# Patient Record
Sex: Male | Born: 1935 | Race: White | Hispanic: No | Smoking: Former smoker
Health system: Southern US, Community
[De-identification: ages and names within clinical notes are randomized; demographics above are authoritative.]

## PROBLEM LIST (undated history)

## (undated) DIAGNOSIS — I82409 Acute embolism and thrombosis of unspecified deep veins of unspecified lower extremity: Secondary | ICD-10-CM

## (undated) DIAGNOSIS — I119 Hypertensive heart disease without heart failure: Secondary | ICD-10-CM

## (undated) DIAGNOSIS — E782 Mixed hyperlipidemia: Secondary | ICD-10-CM

## (undated) DIAGNOSIS — I2510 Atherosclerotic heart disease of native coronary artery without angina pectoris: Secondary | ICD-10-CM

## (undated) DIAGNOSIS — Z8719 Personal history of other diseases of the digestive system: Secondary | ICD-10-CM

## (undated) DIAGNOSIS — R339 Retention of urine, unspecified: Secondary | ICD-10-CM

## (undated) DIAGNOSIS — L0390 Cellulitis, unspecified: Secondary | ICD-10-CM

## (undated) DIAGNOSIS — K649 Unspecified hemorrhoids: Secondary | ICD-10-CM

## (undated) DIAGNOSIS — J449 Chronic obstructive pulmonary disease, unspecified: Secondary | ICD-10-CM

## (undated) DIAGNOSIS — F419 Anxiety disorder, unspecified: Secondary | ICD-10-CM

## (undated) DIAGNOSIS — I5022 Chronic systolic (congestive) heart failure: Secondary | ICD-10-CM

## (undated) DIAGNOSIS — I729 Aneurysm of unspecified site: Secondary | ICD-10-CM

## (undated) DIAGNOSIS — F4310 Post-traumatic stress disorder, unspecified: Secondary | ICD-10-CM

## (undated) DIAGNOSIS — N400 Enlarged prostate without lower urinary tract symptoms: Secondary | ICD-10-CM

## (undated) DIAGNOSIS — I255 Ischemic cardiomyopathy: Secondary | ICD-10-CM

## (undated) DIAGNOSIS — R319 Hematuria, unspecified: Secondary | ICD-10-CM

## (undated) HISTORY — PX: CATARACT EXTRACTION: SUR2

## (undated) HISTORY — PX: CORONARY ANGIOPLASTY: SHX604

## (undated) HISTORY — PX: HIP SURGERY: SHX245

## (undated) HISTORY — DX: Mixed hyperlipidemia: E78.2

## (undated) HISTORY — PX: CHOLECYSTECTOMY: SHX55

## (undated) HISTORY — DX: Atherosclerotic heart disease of native coronary artery without angina pectoris: I25.10

## (undated) HISTORY — DX: Hypertensive heart disease without heart failure: I11.9

## (undated) HISTORY — PX: HAND SURGERY: SHX662

## (undated) HISTORY — PX: VASCULAR SURGERY: SHX849

## (undated) HISTORY — DX: Post-traumatic stress disorder, unspecified: F43.10

## (undated) HISTORY — PX: HEMORRHOID SURGERY: SHX153

---

## 2006-12-21 ENCOUNTER — Ambulatory Visit: Payer: Self-pay | Admitting: Ophthalmology

## 2006-12-21 ENCOUNTER — Other Ambulatory Visit: Payer: Self-pay

## 2007-01-03 ENCOUNTER — Ambulatory Visit: Payer: Self-pay | Admitting: Ophthalmology

## 2007-05-20 ENCOUNTER — Other Ambulatory Visit: Payer: Self-pay

## 2007-05-20 ENCOUNTER — Emergency Department: Payer: Self-pay | Admitting: Emergency Medicine

## 2008-06-27 ENCOUNTER — Emergency Department: Payer: Self-pay | Admitting: Emergency Medicine

## 2008-06-29 ENCOUNTER — Emergency Department: Payer: Self-pay | Admitting: Emergency Medicine

## 2009-07-01 ENCOUNTER — Ambulatory Visit: Payer: Self-pay | Admitting: Family Medicine

## 2009-08-11 ENCOUNTER — Ambulatory Visit: Payer: Self-pay | Admitting: Family Medicine

## 2009-08-23 ENCOUNTER — Inpatient Hospital Stay: Payer: Self-pay | Admitting: Internal Medicine

## 2012-09-23 ENCOUNTER — Emergency Department: Payer: Self-pay | Admitting: Emergency Medicine

## 2012-10-30 ENCOUNTER — Encounter: Payer: Self-pay | Admitting: *Deleted

## 2012-10-31 ENCOUNTER — Encounter: Payer: Self-pay | Admitting: Cardiovascular Disease

## 2012-10-31 ENCOUNTER — Ambulatory Visit (INDEPENDENT_AMBULATORY_CARE_PROVIDER_SITE_OTHER): Payer: 59 | Admitting: Cardiovascular Disease

## 2012-10-31 VITALS — BP 138/74 | HR 59 | Ht 66.0 in | Wt 155.5 lb

## 2012-10-31 DIAGNOSIS — I10 Essential (primary) hypertension: Secondary | ICD-10-CM

## 2012-10-31 DIAGNOSIS — E785 Hyperlipidemia, unspecified: Secondary | ICD-10-CM | POA: Insufficient documentation

## 2012-10-31 DIAGNOSIS — R000 Tachycardia, unspecified: Secondary | ICD-10-CM

## 2012-10-31 DIAGNOSIS — R0602 Shortness of breath: Secondary | ICD-10-CM | POA: Insufficient documentation

## 2012-10-31 DIAGNOSIS — I2510 Atherosclerotic heart disease of native coronary artery without angina pectoris: Secondary | ICD-10-CM

## 2012-10-31 NOTE — Assessment & Plan Note (Signed)
Uncertain if shortness of breath is underlying ischemia. We'll start with echocardiogram. If symptoms persist, would order a stress test. Stent placed to the RCA in 2012. At that time he had circumflex disease and LAD disease

## 2012-10-31 NOTE — Assessment & Plan Note (Addendum)
Etiology of his shortness of breath is not clear. Unable to exclude underlying COPD. No clear signs of heart failure, though we have suggested we order an echocardiogram to evaluate cardiac function, right ventricular systolic pressures. If this is normal and symptoms persist, we'll need to order a stress test. Would likely order a nuclear Myoview given his inability to treadmill. This was discussed with him. He would like to proceed with echo first. No medication changes made

## 2012-10-31 NOTE — Assessment & Plan Note (Signed)
He does report having tachycardia as he goes to sleep. We have suggested he take the metoprolol earlier in the evening and take extra metoprolol as needed for tachycardia or hypertension.

## 2012-10-31 NOTE — Assessment & Plan Note (Signed)
Blood pressure well controlled today. He does report spiking/labile blood pressures. Uncertain if this is from poor sleep, nightmares and PTSD as they seem to happen in the middle of the night. Recommended he take extra metoprolol as needed for now. Other options would be hydralazine or propranolol.

## 2012-10-31 NOTE — Assessment & Plan Note (Signed)
Cholesterol is at goal on the current lipid regimen. No changes to the medications were made.  

## 2012-10-31 NOTE — Progress Notes (Signed)
Patient ID: Daniel Dickson, male    DOB: 05/14/1913, 79 y.o.   MRN: [id number]  HPI Comments: Mr. anger is a very pleasant 79 year old retired veteran with history of hypertension , remote history of smoking, hyperlipidemia, PTSD, coronary artery disease, cardiac arrest in 2012 with occluded RCA at that time, stent placed to his mid RCA. Note indicating ventricular fibrillation though the details are unavailable. He presents to establish care in our office and for symptoms of shortness of breath.  Records from [facility] and [facility] were reviewed He states that he had significant chest pain several years ago when he had his heart attack. Cardiac catheterization report details a 30% ostial RCA disease,ure continues to run low,one option would be to 100% occluded RCA, 70% mid left circumflex disease, 50% proximal LAD disease  Since that time he reports that he has felt well. In the past several months he has had increasing shortness of breath. He denies any significant lower extremity edema. He walks with a cane or crutch secondary to unstable gait. He is otherwise very active. Denies any significant chest pain with exertion. Reports his weight has been stable, no abnormal swelling, no PND or orthopnea. He has difficulty with shortness of breath getting up in his bed, out of a chair, walking to his car or into the bedroom.  He also notices a tachycardia. He has baseline pulse lesion that he hears in his ears for the past 8-10 years. Sometimes has tachycardia and pounding associated with high blood pressure. Sometimes this happens at 2 in the morning. He does for having occasional nightmares, vivid dreams, possibly from PTSD  Prior carotid ultrasound in March 2011 showing a slight plaque bilateral with no significant disease  Echocardiogram dated May 2011 shows normal ejection fraction, LVH, mild MR and TR  EKG shows normal sinus rhythm with rate 59 beats per minute, no significant ST or T wave  changes   Outpatient Encounter Prescriptions as of 10/31/2012  Medication Sig Dispense Refill  . acetaminophen (TYLENOL) 500 MG tablet Take 500 mg by mouth as needed for pain.      Marland Kitchen amLODipine (NORVASC) 10 MG tablet Take 10 mg by mouth daily.      Marland Kitchen aspirin 81 MG tablet Take 81 mg by mouth daily.      . Cholecalciferol (VITAMIN D-3 PO) Take by mouth.      Marland Kitchen lisinopril (PRINIVIL,ZESTRIL) 5 MG tablet Take 5 mg by mouth daily.      . metoprolol tartrate (LOPRESSOR) 25 MG tablet Take 25 mg by mouth 2 (two) times daily.      . nitroGLYCERIN (NITROSTAT) 0.4 MG SL tablet Place 0.4 mg under the tongue every 5 (five) minutes as needed for chest pain.      . pravastatin (PRAVACHOL) 80 MG tablet Take 80 mg by mouth daily.        Review of Systems  Constitutional: Negative.   HENT: Negative.   Eyes: Negative.   Respiratory: Positive for shortness of breath.   Cardiovascular: Negative.   Gastrointestinal: Negative.   Musculoskeletal: Positive for gait problem.  Skin: Negative.   Neurological: Negative.   Psychiatric/Behavioral: Negative.   All other systems reviewed and are negative.    BP 138/74  Pulse 59  Ht 5\' 6"  (1.676 m)  Wt 155 lb 8 oz (70.534 kg)  BMI 25.11 kg/m2  Physical Exam  Nursing note and vitals reviewed. Constitutional: He is oriented to person, place, and time. He appears well-developed and well-nourished.  HENT:  Head: Normocephalic.  Nose: Nose normal.  Mouth/Throat: Oropharynx is clear and moist.  Eyes: Conjunctivae are normal. Pupils are equal, round, and reactive to light.  Neck: Normal range of motion. Neck supple. No JVD present.  Cardiovascular: Normal rate, regular rhythm, S1 normal, S2 normal, normal heart sounds and intact distal pulses.  Exam reveals no gallop and no friction rub.   No murmur heard. Pulmonary/Chest: Effort normal. No respiratory distress. He has decreased breath sounds. He has no wheezes. He has no rales. He exhibits no tenderness.   Abdominal: Soft. Bowel sounds are normal. He exhibits no distension. There is no tenderness.  Musculoskeletal: Normal range of motion. He exhibits no edema and no tenderness.  Lymphadenopathy:    He has no cervical adenopathy.  Neurological: He is alert and oriented to person, place, and time. Coordination normal.  Skin: Skin is warm and dry. No rash noted. No erythema.  Psychiatric: He has a normal mood and affect. His behavior is normal. Judgment and thought content normal.      Assessment and Plan

## 2012-10-31 NOTE — Patient Instructions (Addendum)
You are doing well. No medication changes were made.  Take metoprolol one pill at 7 AM and one pill at 7 pm For emergency high blood pressure and fast heart rate, take an extra metoprolol   We will schedule an echocardiogram for shortness of breath, coronary disease  Please call us if you have new issues that need to be addressed before your next appt.  Your physician wants you to follow-up in: 6 months.  You will receive a reminder letter in the mail two months in advance. If you don't receive a letter, please call our office to schedule the follow-up appointment.

## 2012-11-16 ENCOUNTER — Other Ambulatory Visit (INDEPENDENT_AMBULATORY_CARE_PROVIDER_SITE_OTHER): Payer: 59

## 2012-11-16 ENCOUNTER — Other Ambulatory Visit: Payer: Self-pay

## 2012-11-16 DIAGNOSIS — I2510 Atherosclerotic heart disease of native coronary artery without angina pectoris: Secondary | ICD-10-CM

## 2012-11-16 DIAGNOSIS — R0602 Shortness of breath: Secondary | ICD-10-CM

## 2012-11-16 DIAGNOSIS — I10 Essential (primary) hypertension: Secondary | ICD-10-CM

## 2013-04-29 ENCOUNTER — Ambulatory Visit: Payer: 59 | Admitting: Cardiovascular Disease

## 2013-05-15 ENCOUNTER — Encounter: Payer: Self-pay | Admitting: Cardiovascular Disease

## 2013-05-15 ENCOUNTER — Ambulatory Visit (INDEPENDENT_AMBULATORY_CARE_PROVIDER_SITE_OTHER): Payer: Medicare FFS | Admitting: Cardiovascular Disease

## 2013-05-15 VITALS — BP 120/62 | HR 58 | Ht 67.0 in | Wt 158.5 lb

## 2013-05-15 DIAGNOSIS — I2510 Atherosclerotic heart disease of native coronary artery without angina pectoris: Secondary | ICD-10-CM

## 2013-05-15 DIAGNOSIS — R000 Tachycardia, unspecified: Secondary | ICD-10-CM

## 2013-05-15 DIAGNOSIS — E785 Hyperlipidemia, unspecified: Secondary | ICD-10-CM

## 2013-05-15 DIAGNOSIS — R0602 Shortness of breath: Secondary | ICD-10-CM

## 2013-05-15 DIAGNOSIS — I10 Essential (primary) hypertension: Secondary | ICD-10-CM

## 2013-05-15 NOTE — Assessment & Plan Note (Signed)
Shortness of breath is mild, stable. He does not feel that he needs additional workup at this time

## 2013-05-15 NOTE — Progress Notes (Signed)
Patient ID: Daniel Dickson, male    DOB: 05/14/1913, 80 y.o.   MRN: [id number]  HPI Comments: Daniel Dickson is a very pleasant 80 year old retired veteran with history of hypertension , remote history of smoking, hyperlipidemia, PTSD, coronary artery disease, cardiac arrest in 2012 with occluded RCA at that time, stent placed to his mid RCA. Note indicating ventricular fibrillation though the details are unavailable. Previous symptoms of shortness of breath. He presents for routine followup  In followup today, he reports that he is doing well overall. He is active, does not do any regular exercise but does not have any significant limitations. Previously had increasing shortness of breath. He reports that this is stable. Denies any  significant chest pain with exertion. Reports his weight has been stable, no abnormal swelling, no PND or orthopnea.  He walks with a cane Biggest complaint is a pulsation in his ear particularly at nighttime when he is sleeping.  He does for having occasional nightmares, vivid dreams, possibly from PTSD  Records from [facility] and [facility] were reviewed He states that he had significant chest pain several years ago when he had his heart attack. Cardiac catheterization report details a 30% ostial RCA disease,ure continues to run low,one option would be to 100% occluded RCA, 70% mid left circumflex disease, 50% proximal LAD disease  Prior carotid ultrasound in March 2011 showing a slight plaque bilateral with no significant disease  Echocardiogram dated May 2011 shows normal ejection fraction, LVH, mild MR and TR Recent lab work showing total cholesterol 145, LDL 66, HDL 56, creatinine 1.54, BUN 31, normal LFTs  EKG shows normal sinus rhythm with rate 58 beats per minute, no significant ST or T wave changes   Outpatient Encounter Prescriptions as of 05/15/2013  Medication Sig  . acetaminophen (TYLENOL) 500 MG tablet Take 500 mg by mouth as needed for pain.  Marland Kitchen amLODipine  (NORVASC) 10 MG tablet Take 10 mg by mouth daily.  Marland Kitchen aspirin 81 MG tablet Take 81 mg by mouth daily.  . Cholecalciferol (VITAMIN D-3 PO) Take by mouth.  Marland Kitchen lisinopril (PRINIVIL,ZESTRIL) 5 MG tablet Take 5 mg by mouth daily.  . metoprolol tartrate (LOPRESSOR) 25 MG tablet Take 25 mg by mouth 2 (two) times daily.  . nitroGLYCERIN (NITROSTAT) 0.4 MG SL tablet Place 0.4 mg under the tongue every 5 (five) minutes as needed for chest pain.  . pravastatin (PRAVACHOL) 80 MG tablet Take 80 mg by mouth daily.    Review of Systems  Constitutional: Negative.   HENT: Negative.   Eyes: Negative.   Respiratory: Positive for shortness of breath.   Cardiovascular: Negative.   Gastrointestinal: Negative.   Endocrine: Negative.   Musculoskeletal: Positive for gait problem.  Skin: Negative.   Allergic/Immunologic: Negative.   Neurological: Negative.   Hematological: Negative.   Psychiatric/Behavioral: Negative.   All other systems reviewed and are negative.    BP 120/62  Pulse 58  Ht 5\' 7"  (1.702 m)  Wt 158 lb 8 oz (71.895 kg)  BMI 24.82 kg/m2  Physical Exam  Nursing note and vitals reviewed. Constitutional: He is oriented to person, place, and time. He appears well-developed and well-nourished.  Elderly gentleman who walks with a cane  HENT:  Head: Normocephalic.  Nose: Nose normal.  Mouth/Throat: Oropharynx is clear and moist.  Eyes: Conjunctivae are normal. Pupils are equal, round, and reactive to light.  Neck: Normal range of motion. Neck supple. No JVD present.  Cardiovascular: Normal rate, regular rhythm, S1 normal,  S2 normal, normal heart sounds and intact distal pulses.  Exam reveals no gallop and no friction rub.   No murmur heard. Pulmonary/Chest: Effort normal. No respiratory distress. He has decreased breath sounds. He has no wheezes. He has no rales. He exhibits no tenderness.  Abdominal: Soft. Bowel sounds are normal. He exhibits no distension. There is no tenderness.   Musculoskeletal: Normal range of motion. He exhibits no edema and no tenderness.  Lymphadenopathy:    He has no cervical adenopathy.  Neurological: He is alert and oriented to person, place, and time. Coordination normal.  Skin: Skin is warm and dry. No rash noted. No erythema.  Psychiatric: He has a normal mood and affect. His behavior is normal. Judgment and thought content normal.      Assessment and Plan

## 2013-05-15 NOTE — Patient Instructions (Addendum)
You are doing well. No medication changes were made.  Please call us if you have new issues that need to be addressed before your next appt.  Your physician wants you to follow-up in: 3 months You will receive a reminder letter in the mail two months in advance. If you don't receive a letter, please call our office to schedule the follow-up appointment.   

## 2013-05-15 NOTE — Assessment & Plan Note (Signed)
Blood pressure is well controlled on today's visit. No changes made to the medications. 

## 2013-05-15 NOTE — Assessment & Plan Note (Signed)
Currently with no symptoms of angina. No further workup at this time. Continue current medication regimen. 

## 2013-05-15 NOTE — Assessment & Plan Note (Signed)
Cholesterol is at goal on the current lipid regimen. No changes to the medications were made.  

## 2013-05-15 NOTE — Assessment & Plan Note (Signed)
Occasional palpitations at nighttime. He is not particularly troubled by this. Suggested he call us of symptoms get worse

## 2013-08-12 ENCOUNTER — Encounter: Payer: Self-pay | Admitting: Cardiovascular Disease

## 2013-08-12 ENCOUNTER — Ambulatory Visit (INDEPENDENT_AMBULATORY_CARE_PROVIDER_SITE_OTHER): Payer: Medicare FFS | Admitting: Cardiovascular Disease

## 2013-08-12 VITALS — BP 140/70 | HR 62 | Ht 66.0 in | Wt 154.0 lb

## 2013-08-12 DIAGNOSIS — I2510 Atherosclerotic heart disease of native coronary artery without angina pectoris: Secondary | ICD-10-CM

## 2013-08-12 DIAGNOSIS — I10 Essential (primary) hypertension: Secondary | ICD-10-CM

## 2013-08-12 DIAGNOSIS — E785 Hyperlipidemia, unspecified: Secondary | ICD-10-CM

## 2013-08-12 DIAGNOSIS — R0602 Shortness of breath: Secondary | ICD-10-CM

## 2013-08-12 NOTE — Assessment & Plan Note (Signed)
Blood pressure is well controlled on today's visit. No changes made to the medications. 

## 2013-08-12 NOTE — Assessment & Plan Note (Signed)
Chronic mild shortness of breath. No change from previous office visits.

## 2013-08-12 NOTE — Assessment & Plan Note (Signed)
Currently with no symptoms of angina. No further workup at this time. Continue current medication regimen. 

## 2013-08-12 NOTE — Patient Instructions (Signed)
You are doing well. No medication changes were made.  Please call us if you have new issues that need to be addressed before your next appt.  Your physician wants you to follow-up in: 6 months.  You will receive a reminder letter in the mail two months in advance. If you don't receive a letter, please call our office to schedule the follow-up appointment.   

## 2013-08-12 NOTE — Assessment & Plan Note (Signed)
Recommended he stay on his pravastatin

## 2013-08-12 NOTE — Progress Notes (Signed)
Patient ID: Ova FreshwaterGeorge H Dickson, male    DOB: 08/17/1912, 80 y.o.   MRN: [id number]  HPI Comments: [name] is a very pleasant 80 year old retired veteran with history of hypertension , remote history of smoking, hyperlipidemia, PTSD, coronary artery disease, cardiac arrest in 2012 with occluded RCA at that time, stent placed to his mid RCA. Note indicating ventricular fibrillation though the details are unavailable. Previous symptoms of shortness of breath. He presents for routine followup.   In followup today, he reports that he is doing well overall. He is active, does not do any regular exercise but does not have any significant limitations Stable chronic mild shortness of breath with exertion. Denies any  significant chest pain with exertion. weight has been stable, no abnormal swelling, no PND or orthopnea.  He walks with a cane Biggest complaint continues to be a pulsation in his ear particularly at nighttime when he is sleeping.   He does have occasional nightmares, vivid dreams, possibly from PTSD  significant chest pain several years ago when he had his heart attack. Cardiac catheterization report details a 30% ostial RCA disease,ure continues to run low,one option would be to 100% occluded RCA, 70% mid left circumflex disease, 50% proximal LAD disease  Prior carotid ultrasound in March 2011 showing a slight plaque bilateral with no significant disease  Echocardiogram dated May 2011 shows normal ejection fraction, LVH, mild MR and TR Recent lab work showing total cholesterol 145, LDL 66, HDL 56, creatinine 1.54, BUN 31, normal LFTs  EKG shows normal sinus rhythm with rate 62 beats per minute, no significant ST or T wave changes   Outpatient Encounter Prescriptions as of 08/12/2013  Medication Sig  . acetaminophen (TYLENOL) 500 MG tablet Take 500 mg by mouth as needed for pain.  Marland Kitchen. amLODipine (NORVASC) 10 MG tablet Take 10 mg by mouth daily.  Marland Kitchen. aspirin 81 MG tablet Take 81 mg by mouth  daily.  . Cholecalciferol (VITAMIN D-3 PO) Take by mouth.  Marland Kitchen. lisinopril (PRINIVIL,ZESTRIL) 5 MG tablet Take 5 mg by mouth daily.  . metoprolol tartrate (LOPRESSOR) 25 MG tablet Take 25 mg by mouth 2 (two) times daily.  . nitroGLYCERIN (NITROSTAT) 0.4 MG SL tablet Place 0.4 mg under the tongue every 5 (five) minutes as needed for chest pain.  . pravastatin (PRAVACHOL) 80 MG tablet Take 80 mg by mouth daily.    Review of Systems  Constitutional: Negative.   HENT: Negative.   Eyes: Negative.   Respiratory: Positive for shortness of breath.   Cardiovascular: Negative.   Gastrointestinal: Negative.   Endocrine: Negative.   Musculoskeletal: Positive for gait problem.  Skin: Negative.   Allergic/Immunologic: Negative.   Neurological: Negative.   Hematological: Negative.   Psychiatric/Behavioral: Negative.   All other systems reviewed and are negative.   BP 140/70  Pulse 62  Ht 5\' 6"  (1.676 m)  Wt 154 lb (69.854 kg)  BMI 24.87 kg/m2  Physical Exam  Nursing note and vitals reviewed. Constitutional: He is oriented to person, place, and time. He appears well-developed and well-nourished.  Elderly gentleman who walks with a cane  HENT:  Head: Normocephalic.  Nose: Nose normal.  Mouth/Throat: Oropharynx is clear and moist.  Eyes: Conjunctivae are normal. Pupils are equal, round, and reactive to light.  Neck: Normal range of motion. Neck supple. No JVD present.  Cardiovascular: Normal rate, regular rhythm, S1 normal, S2 normal, normal heart sounds and intact distal pulses.  Exam reveals no gallop and no friction rub.  No murmur heard. Pulmonary/Chest: Effort normal. No respiratory distress. He has decreased breath sounds. He has no wheezes. He has no rales. He exhibits no tenderness.  Abdominal: Soft. Bowel sounds are normal. He exhibits no distension. There is no tenderness.  Musculoskeletal: Normal range of motion. He exhibits no edema and no tenderness.  Lymphadenopathy:    He  has no cervical adenopathy.  Neurological: He is alert and oriented to person, place, and time. Coordination normal.  Skin: Skin is warm and dry. No rash noted. No erythema.  Psychiatric: He has a normal mood and affect. His behavior is normal. Judgment and thought content normal.      Assessment and Plan

## 2014-02-06 ENCOUNTER — Emergency Department: Payer: Self-pay | Admitting: Emergency Medicine

## 2014-02-06 LAB — COMPREHENSIVE METABOLIC PANEL
ALK PHOS: 44 U/L — AB
ALT: 27 U/L
AST: 32 U/L (ref 15–37)
Albumin: 3.9 g/dL (ref 3.4–5.0)
Anion Gap: 9 (ref 7–16)
BUN: 29 mg/dL — ABNORMAL HIGH (ref 7–18)
Bilirubin,Total: 0.6 mg/dL (ref 0.2–1.0)
CHLORIDE: 104 mmol/L (ref 98–107)
Calcium, Total: 8.9 mg/dL (ref 8.5–10.1)
Co2: 26 mmol/L (ref 21–32)
Creatinine: 1.61 mg/dL — ABNORMAL HIGH (ref 0.60–1.30)
EGFR (African American): 51 — ABNORMAL LOW
GFR CALC NON AF AMER: 42 — AB
Glucose: 112 mg/dL — ABNORMAL HIGH (ref 65–99)
OSMOLALITY: 284 (ref 275–301)
POTASSIUM: 3.9 mmol/L (ref 3.5–5.1)
Sodium: 139 mmol/L (ref 136–145)
Total Protein: 7.4 g/dL (ref 6.4–8.2)

## 2014-02-06 LAB — CBC
HCT: 47.3 % (ref 40.0–52.0)
HGB: 15.6 g/dL (ref 13.0–18.0)
MCH: 33.3 pg (ref 26.0–34.0)
MCHC: 32.9 g/dL (ref 32.0–36.0)
MCV: 102 fL — AB (ref 80–100)
Platelet: 143 10*3/uL — ABNORMAL LOW (ref 150–440)
RBC: 4.67 10*6/uL (ref 4.40–5.90)
RDW: 14.2 % (ref 11.5–14.5)
WBC: 12.7 10*3/uL — AB (ref 3.8–10.6)

## 2014-02-06 LAB — TROPONIN I: Troponin-I: <0.02 ng/mL

## 2014-02-12 ENCOUNTER — Ambulatory Visit (INDEPENDENT_AMBULATORY_CARE_PROVIDER_SITE_OTHER): Payer: Medicare FFS | Admitting: Cardiovascular Disease

## 2014-02-12 ENCOUNTER — Encounter: Payer: Self-pay | Admitting: Cardiovascular Disease

## 2014-02-12 VITALS — BP 140/70 | HR 55 | Ht 66.0 in | Wt 158.0 lb

## 2014-02-12 DIAGNOSIS — E785 Hyperlipidemia, unspecified: Secondary | ICD-10-CM

## 2014-02-12 DIAGNOSIS — I2510 Atherosclerotic heart disease of native coronary artery without angina pectoris: Secondary | ICD-10-CM

## 2014-02-12 DIAGNOSIS — I10 Essential (primary) hypertension: Secondary | ICD-10-CM

## 2014-02-12 DIAGNOSIS — R000 Tachycardia, unspecified: Secondary | ICD-10-CM

## 2014-02-12 NOTE — Patient Instructions (Addendum)
You are doing well. No medication changes were made.  Please call us if you have new issues that need to be addressed before your next appt.  Your physician wants you to follow-up in: 6 months.  You will receive a reminder letter in the mail two months in advance. If you don't receive a letter, please call our office to schedule the follow-up appointment.  Your next appointment will be scheduled in our new office located at :  [facility]  [address], [address]  [place], [place] [address]  

## 2014-02-12 NOTE — Progress Notes (Signed)
Patient ID: Daniel Dickson, male    DOB: 12/15/1912, 80 y.o.   MRN: [id number]  HPI Comments: Daniel Dickson is a very pleasant 80 year old retired veteran with history of hypertension , remote history of smoking, hyperlipidemia, PTSD, coronary artery disease, cardiac arrest in 2012 with occluded RCA at that time, stent placed to his mid RCA. Notes indicating ventricular fibrillation though the details are unavailable. Previous symptoms of shortness of breath. He presents for routine followup.   In followup today, he reports that he is doing well overall.  He continues to take care of his son who is age 78 with Down syndrome. He cooks many of his meals. He is active, does not do any regular exercise  He denies having any significant limitations Denies shortness of breath or chest pain. He did have chest congestion also up in his head and was started on antibiotics. Seen in the emergency room last week, now feels better He walks with a cane Continued complaint of a pulsation in his ear particularly at nighttime when he is sleeping.   He does have occasional nightmares, vivid dreams, possibly from PTSD  Prior past medical history significant chest pain several years ago when he had his heart attack. Cardiac catheterization report details a 30% ostial RCA disease,ure continues to run low,one option would be to 100% occluded RCA, 70% mid left circumflex disease, 50% proximal LAD disease  Prior carotid ultrasound in March 2011 showing a slight plaque bilateral with no significant disease  Echocardiogram dated May 2011 shows normal ejection fraction, LVH, mild MR and TR Recent lab work showing total cholesterol 145, LDL 66, HDL 56, creatinine 1.54, BUN 31, normal LFTs  EKG performed todayshows normal sinus rhythm with rate 55 beats per minute, no significant ST or T wave changes   Outpatient Encounter Prescriptions as of 02/12/2014  Medication Sig  . acetaminophen (TYLENOL) 500 MG tablet Take 500 mg  by mouth as needed for pain.  Marland Kitchen. amLODipine (NORVASC) 10 MG tablet Take 10 mg by mouth daily.  Marland Kitchen. aspirin 81 MG tablet Take 81 mg by mouth daily.  . Cholecalciferol (VITAMIN D-3 PO) Take by mouth.  Marland Kitchen. lisinopril (PRINIVIL,ZESTRIL) 5 MG tablet Take 5 mg by mouth daily.  . metoprolol tartrate (LOPRESSOR) 25 MG tablet Take 25 mg by mouth 2 (two) times daily.  . nitroGLYCERIN (NITROSTAT) 0.4 MG SL tablet Place 0.4 mg under the tongue every 5 (five) minutes as needed for chest pain.  . pravastatin (PRAVACHOL) 80 MG tablet Take 80 mg by mouth daily.   In terms of her social history, he  reports that he has quit smoking. His smoking use included Cigarettes. He has a 120 pack-year smoking history. He does not have any smokeless tobacco history on file. He reports that he drinks alcohol. He reports that he does not use illicit drugs.  Review of Systems  Constitutional: Negative.   HENT: Positive for congestion.   Respiratory: Negative.   Cardiovascular: Negative.   Musculoskeletal: Positive for gait problem.  Neurological: Negative.   Psychiatric/Behavioral: Negative.   All other systems reviewed and are negative.   BP 140/70 mmHg  Pulse 55  Ht 5\' 6"  (1.676 m)  Wt 158 lb (71.668 kg)  BMI 25.51 kg/m2  Physical Exam  Constitutional: He is oriented to person, place, and time. He appears well-developed and well-nourished.  HENT:  Head: Normocephalic.  Nose: Nose normal.  Mouth/Throat: Oropharynx is clear and moist.  Eyes: Conjunctivae are normal. Pupils are equal, round,  and reactive to light.  Neck: Normal range of motion. Neck supple. No JVD present.  Cardiovascular: Normal rate, regular rhythm, S1 normal, S2 normal, normal heart sounds and intact distal pulses.  Exam reveals no gallop and no friction rub.   No murmur heard. Pulmonary/Chest: Effort normal and breath sounds normal. No respiratory distress. He has no wheezes. He has no rales. He exhibits no tenderness.  Abdominal: Soft. Bowel  sounds are normal. He exhibits no distension. There is no tenderness.  Musculoskeletal: Normal range of motion. He exhibits no edema or tenderness.  Lymphadenopathy:    He has no cervical adenopathy.  Neurological: He is alert and oriented to person, place, and time. Coordination normal.  Skin: Skin is warm and dry. No rash noted. No erythema.  Psychiatric: He has a normal mood and affect. His behavior is normal. Judgment and thought content normal.      Assessment and Plan   Nursing note and vitals reviewed.

## 2014-02-12 NOTE — Assessment & Plan Note (Signed)
Blood pressure is well controlled on today's visit. No changes made to the medications. 

## 2014-02-12 NOTE — Assessment & Plan Note (Signed)
Cholesterol is at goal on the current lipid regimen. No changes to the medications were made.  

## 2014-02-12 NOTE — Assessment & Plan Note (Signed)
Currently with no symptoms of angina. No further workup at this time. Continue current medication regimen. 

## 2014-06-13 ENCOUNTER — Inpatient Hospital Stay: Payer: Self-pay | Admitting: Internal Medicine

## 2014-06-13 ENCOUNTER — Encounter: Payer: Self-pay | Admitting: Physician Assistant

## 2014-06-13 DIAGNOSIS — I10 Essential (primary) hypertension: Secondary | ICD-10-CM | POA: Diagnosis not present

## 2014-06-13 DIAGNOSIS — Z01810 Encounter for preprocedural cardiovascular examination: Secondary | ICD-10-CM

## 2014-06-13 DIAGNOSIS — E785 Hyperlipidemia, unspecified: Secondary | ICD-10-CM

## 2014-06-14 DIAGNOSIS — I10 Essential (primary) hypertension: Secondary | ICD-10-CM | POA: Diagnosis not present

## 2014-06-17 ENCOUNTER — Encounter
Admit: 2014-06-17 | Disposition: A | Discharge status: Home / Self Care | Payer: Self-pay | Attending: Internal Medicine | Admitting: Internal Medicine

## 2014-07-11 ENCOUNTER — Encounter
Admit: 2014-07-11 | Disposition: A | Discharge status: Home / Self Care | Payer: Self-pay | Attending: Internal Medicine | Admitting: Internal Medicine

## 2014-08-10 NOTE — H&P (Signed)
PATIENT NAME:  Daniel Dickson, Daniel Dickson MR#:  [id number] DATE OF BIRTH:  09-07-1912  DATE OF ADMISSION:  06/13/2014  PRIMARY CARE PHYSICIAN: Dr. Quillian Quince.    REFERRING EMERGENCY ROOM PHYSICIAN: Dr. Inocencio Homes.   CHIEF COMPLAINT: Fall.   HISTORY OF PRESENT ILLNESS: The patient is a 81 year old Caucasian male who is presenting to the ED with a chief complaint of mechanical fall, right hip and right hand pain. The patient still drives and reporting that he has history of coronary artery disease and stents were placed in the past. He sees Dr. Mariah Milling as an outpatient and his last visit was 2 months ago. The ER physician has called and notified to on-call orthopedics Dr. Martha Clan, but the patient was seen by Dr. Rosita Kea in the past, so consult is placed to Dr. Rosita Kea.  During my examination the patient denies any chest pain or shortness of breath. No other complaints. Her granddaughter was at bedside.   PAST MEDICAL HISTORY: Hypertension, posttraumatic stress disorder, COPD, benign prostatic hypertrophy, coronary artery disease status post stent placement, hemorrhoids.    PAST SURGICAL HISTORY: Coronary artery stent placement, cholecystectomy, cataract surgery.    ALLERGIES:  PENICILLIN, IV DYE, CORTISONE.   PSYCHOSOCIAL HISTORY: Lives at home, lives alone.  He used to smoke for 35 years and quit smoking. He drinks 10 ounces of red wine on a daily basis. Denies any illicit drug usage. The patient lives with his son who has Down syndrome.   FAMILY HISTORY: Hypertension runs in his family.   HOME MEDICATIONS: Vitamin D3, 1000 international units 1 capsule p.o. once a day, pravastatin 40 mg p.o. once daily, metoprolol 25 mg p.o. b.i.d., lisinopril 5 mg p.o. once daily, Flomax 0.4 mg 1 capsule p.o. once daily, lisinopril 5 mg p.o. once daily, aspirin 81 mg p.o. once daily, enalapril 10 mg p.o. once daily, albuterol inhalation 4 times as needed basis.    REVIEW OF SYSTEMS:   CONSTITUTIONAL:  Denies any fever, fatigue,  weakness.  EYES: Denies blurry vision, double vision.  EARS, NOSE, AND THROAT: Denies epistaxis, discharge.  RESPIRATION: Denies cough. Has a history of COPD.   CARDIOVASCULAR: No chest pain. Has a history of coronary artery disease. No palpitations or syncope.  GASTROINTESTINAL: Denies nausea, vomiting, diarrhea, abdominal pain.  ENDOCRINE:  Denies polyuria, nocturia.  Has prostatic hypertrophy.   HEMATOLOGIC AND LYMPHATIC:  No anemia, easy bruising.  INTEGUMENTARY: No acne, rash, lesions.  MUSCULOSKELETAL: No joint pain in the neck and back. Denies any gout.  NEUROLOGIC: Denies any vertigo, ataxia. Has posttraumatic stress disorder including headaches.   PSYCHIATRIC:  No anxiety, OCD.    PHYSICAL EXAMINATION:  VITAL SIGNS: Temperature 97.9, respirations 20, blood pressure 117/65, pulse oximetry is 94%.  GENERAL APPEARANCE: Not in acute distress. Moderately built and nourished.  HEENT: Normocephalic, atraumatic. Pupils are equally reacting to light and accommodation. No scleral icterus.  No conjunctival injection. No sinus tenderness. No postnasal drip. Moist mucous membranes.  NECK: Supple. No JVD. No thyromegaly. Range of motion is intact.  LUNGS: Clear to auscultation bilaterally. No accessory muscle use.  No anterior chest wall tenderness on palpation.    CARDIAC: S1, S2 normal. Regular rate and rhythm. No murmurs.  GASTROINTESTINAL: Soft. Bowel sounds are positive in all 4 quadrants. Nontender, nondistended. No masses felt.  NEUROLOGIC: Alert, oriented x 3. Cranial nerves II through XII are grossly intact. Motor and sensory are intact. Reflexes are 2 +.  EXTREMITIES: Right lower extremity is tender and externally rotated. Right upper extremity in  splint, tender to touch.  PSYCHIATRIC: Normal mood and affect.  EXTREMITIES: No edema, no cyanosis, no clubbing.   LABORATORY AND IMAGING STUDIES: BMP, glucose 104, BUN 38, creatinine 1.45, sodium 134, potassium chloride and CO2 are normal.  GFR 48. Anion gap is 6. Serum osmolality and calcium are normal. LFTs are normal except alkaline phosphatase at 28. WBC 8.7, hemoglobin 14.4, hematocrit 44.5, platelets are 145,000. PT-INR and PTT are normal. Urinalysis, yellow in color, hazy in appearance, glucose and bilirubin are negative, ketones trace, nitrite and leukocytes are negative. Right hip x-ray, minimally displaced proximal right femoral neck fracture. Right shoulder complete, no acute bony pathology.  Right wrist complete, Colles type fracture of the distal radius and ulnar styloid. Right forearm Colles type fracture of the distal radius and ulnar styloid, no acute findings proximal to that.  Chest x-ray portable, no edema or consolidation. A 12 lead EKG, normal sinus rhythm at 66 beats per minute, no acute ST wave changes.   ASSESSMENT AND PLAN: A 81 year old Caucasian male presenting to the ED after sustaining a fall, diagnosed with right femoral neck fracture and right radius and ulnar styloid fracture.  Emergency Room physician notified on call orthopedics doctor Dr. Martha ClanKrasinski, but consult was placed to Dr. Rosita KeaMenz as the patient was seen by Dr. Rosita KeaMenz in the past.   1.  Mechanical fall status post right femoral neck fracture, right distal radius and ulnar styloid fracture. Admit him to orthopedics floor. Provide pain management.  Orthopedics consult is placed to Dr. Rosita KeaMenz.  Will put a consult to cardiology for preop clearance as the patient has a history of coronary artery disease status post stent placement. We will provide pain management with Percocet and morphine for moderate to severe pain.  The right upper extremity is placed in a splint for stabilization.  2.  Coronary artery disease status post stent.  We will resume his home medication and put a consult to cardiology Dr. Mariah MillingGollan. We will continue aspirin after surgery.  We will continue statin. 3.  Hypertension. We will continue his home medication, metoprolol, lisinopril, and  amlodipine.  Up-titrate medication as-needed basis.  4.  Benign prostatic hypertrophy. Continue Flomax.  5.  Hyperlipidemia. Continue pravastatin.  6.  Posttraumatic stress disorder. We will provide him Tylenol as needed basis for PTSD-induced headaches.  7.  History of chronic obstructive pulmonary disease. Continue albuterol inhaler every 4 hours as needed for shortness of breath. Currently the patient is not in exacerbation.  8.  We will provide GI prophylaxis with Pepcid and DVT prophylaxis with TEDs at this time as the patient is most probably going to go for surgery.  9.  He is full code. Daughter is the medical power of attorney. Plan of care discussed with the patient and granddaughter at bedside. They both verbalized understanding of the plan.   TOTAL TIME SPENT: 45 minutes.    ____________________________ Ramonita LabAruna Jakki Doughty, MD ag:bu D: 06/13/2014 17:58:55 ET T: 06/13/2014 18:20:10 ET JOB#: [id number]  cc: Ramonita LabAruna Windel Keziah, MD, <Dictator> Burley SaverL. Katherine Bliss, MD Antonieta Ibaimothy J. Gollan, MD Leitha SchullerMichael J. Menz, MD Ramonita LabARUNA Jakel Alphin MD ELECTRONICALLY SIGNED 06/30/2014 23:00

## 2014-08-10 NOTE — Op Note (Signed)
PATIENT NAME:  Daniel Dickson, Daniel Dickson MR#:  [id number] DATE OF BIRTH:  1913-02-01  DATE OF PROCEDURE:  06/14/2014   PREOPERATIVE DIAGNOSES:  1.  Right femoral neck hip fracture.  2.  Right distal radius fracture.  POSTOPERATIVE DIAGNOSES:   1.  Right femoral neck hip fracture.   2.  Right distal radius fracture.    PROCEDURE:  Percutaneous fixation of right femoral neck hip fracture and closed reduction and casting of right distal radius fracture.   SURGEON: [place] Park, M.D.   ANESTHESIA: Spinal with MAC.   ESTIMATED BLOOD LOSS: Minimal.   COMPLICATIONS: None.   IMPLANTS:  Synthes 7.3 mm cannulated screws x3 and a short arm cast.   INDICATIONS FOR PROCEDURE: The patient is a 81 year old male who is very active and is an independent community ambulator at baseline. He states he fell when he turned  to grab change off his dresser to go get coffee with his friends. This caused him to lose balance and he fell onto his right side, injuring his right wrist and right hip. He was brought to the [facility] where he was diagnosed with a displaced distal radius fracture, as well as a nondisplaced impacted fracture of the right femoral neck. I recommended a closed reduction and casting of the right distal radius fracture as well as percutaneous pinning for the right hip fracture. I reviewed the risks and benefits of both procedures with the patient and his family. They understand that the risks include infection, bleeding, nerve or blood vessel injury, avascular necrosis, failure of the hardware, malunion, nonunion, persistent hip pain and the need for further surgery. Medical risks include DVT  and pulmonary embolism, myocardial infarction, stroke, pneumonia, respiratory failure and death. The risk for to the upper extremity procedure includes skin tear, failure to reduce the fracture, loss of reduction, and the need for further intervention, including repeat closed reduction  versus open reduction internal fixation.   PROCEDURE NOTE: The patient was marked over the right hip and arm according to the hospital's correct site of surgery protocol. He underwent a spinal anesthetic by the anesthesia service was placed supine on the operative table. The hip was addressed first. Right leg was placed in a right leg holder. Minimal traction but internal rotation was applied to the right hip. The left leg was placed in a well leg holder and adequately padded to protect all bony prominences. The patient was prepped and draped in a sterile fashion. A timeout was performed to verify the patient's name, date of birth, medical record number, correct site of surgery and correct procedure to be performed. It was also used to verify the patient had received antibiotics, all appropriate instruments, implants radiographic studies were available in the room. Once all in attendance, were in agreement, the case began.   C-arm imaging was used to ensure an adequate closed reduction of the fracture. The approximately 2 cm incision was made in line with the femur. Three threaded guide pins were then advanced in an inverted triangle position into the femoral neck,  across the fracture and into the femoral head. Their positions were confirmed on AP and lateral C-arm imaging. These were then measured for depth with a depth gauge and overdrilled. Three 7.3 mm cannulated screws were then advanced into position and tightened by hand. Final images of the 3 cannulated screw construct were taken in both the AP and lateral planes. The incision was copiously irrigated. The subcutaneous tissue was closed with 2-0 Vicryl  and the skin was approximated with staples. The drapes were then taken down. The patient then had his splint removed off the right arm. He had a small skin tear over his right thumb, which was reduced. The thumb was dressed with Xeroform and 4 x 4's. This was taped into position. The patient then had  Fluoroscan images taken of the distal radius. A closed reduction was performed re-creating the mechanism of the fracture. This reduced the fracture very well. A short arm cast was then applied. A 3-point mold was applied during the fabrication of the short arm cast. Once the cast had cured, he was placed in a sling. The patient was awakened and brought to the PACU in stable condition. I was scrubbed and present for the entire case, and all sharp and instrument counts were correct at the conclusion of the case.  I met with the patient's family in the postoperative waiting room to let them know the case had gone without complication and the patient was stable in the recovery room.     ____________________________ Timoteo Gaul, MD klk:at D: 06/17/2014 18:59:27 ET T: 06/17/2014 19:29:27 ET JOB#: [id number]  cc: Timoteo Gaul, MD, <Dictator> Timoteo Gaul MD ELECTRONICALLY SIGNED 06/23/2014 12:25

## 2014-08-10 NOTE — Consult Note (Signed)
General Aspect Primary Cardiologist: Dr. Rockey Situ, MD ________________  81 year old male with history of CAD s/p MI s/p v-fib - cardiac arrest in 2012, HTN, HLD, PTSD with resultant headache disorder, and BPH who presented to [facility] today after suffering a mechanical fall in his hosue and suffering a Colles fracture of the distal radius and ulnar styloid and a mildly displaced right femoral neck fracture. Cardiology is consulted for surgical clearance.  _______________  PMH: 1. CAD s/p MI s/p v-fib - cardiac arrest in 2012 2. HTN 3. HLD 4. PTSD with resultant headache disorder 5. BPH _________________   Present Illness 81 year old male with the above problem list who presented to [facility] with the above CC.  He has known CAD and is status post MI with history of v-fib arrest in 2012. Symptoms at that time were mainly SOB. Cardiac cath report at that time showed occluded RCA at that time, BMS stent placed to his mid RCA. Remaining cath data included 50% pLAD, 70% LCx, 30% ostial RCA, occluded mRCA s/p Coronary Integrity BMS as above. Echo in May 2011 shows normal ejection fraction, LVH, mild MR and TR. Echo from 11/2012 showed EF 55-60%, GR1DD, no WMA. No ischemic evaluations since 2012. He fairly active at baseline during the day. He visits with his friends daily to have coffee. He takes care of his 48 year old son with Downs Syndrome. He does not do any formal exercise program. Denies shortness of breath or chest pain. He typically walks with a cane. He reports numerous falls throughout his lifetime, though none as bad as this one. He was last seen in outpatient cardiology follow up 02/12/2014 and was doing well at that time. He has continued to do well since that visit. He denies any chest pains, SOB, palpitations, nausea, vomiting, presyncope, or syncope. He reports that he has been feeling quite like himself without any changes. He currently takes aspirin 81 mg , pravastain 80 mg, Lopressor 25 mg bid,  lisinopril 5 mg, and Norvasc 10 mg. He was also received started on Flomax for BPH and reports that he is tolerating this well.   This morning he had gotten up and fixed himself a cup of coffee. Approximately 5-10 minutes by his report he was standing over by a table. He went to turn. He turned left and should have turned right. His feet got caught up underneath himself, he tripped over himself, and he fell on his rightside, on an outstretched arm. He was feeling fine leading up to this fall. No chest pain, palpitations, SOB, or diaphoresis. He did not hit his head or suffer LOC. No seizure activity.   He was brought to [facility] for evaluation. Upon his arrival to [facility] he was found to have a Colles fracture of the distal radius and ulnar styloid and a mildly displaced right femoral neck fracture. He was splinted in the ED. EKG NSR, 66 bpm, intermediate axis, no significant st/t changes. He is currently having intermittent leg muscle spasms.   Physical Exam:  GEN well developed, no acute distress   HEENT hearing intact to voice, moist oral mucosa   NECK supple  no JVD   RESP normal resp effort  clear BS   CARD Regular rate and rhythm  No murmur   ABD denies tenderness  soft   EXTR negative edema, right arm in splint and sling   SKIN normal to palpation   NEURO motor/sensory function intact, right arm in soft cast, right  leg with immobilizer   PSYCH alert, A+O to time, place, person, good insight   Review of Systems:  Subjective/Chief Complaint right arm and right hip with pain   General: Fatigue   Skin: No Complaints   ENT: No Complaints   Eyes: No Complaints   Neck: No Complaints   Respiratory: No Complaints   Cardiovascular: No Complaints   Gastrointestinal: No Complaints   Genitourinary: Frequent urination   Vascular: Leg cramping   Musculoskeletal: Muscle or joint pain  Muscle or joint swelling   Neurologic: No Complaints   Hematologic: No Complaints    Endocrine: No Complaints   Psychiatric: No Complaints   Review of Systems: All other systems were reviewed and found to be negative   Medications/Allergies Reviewed Medications/Allergies reviewed   Family & Social History:  Family and Social History:  Family History Negative  Hypertension   Social History negative ETOH, negative Illicit drugs   + Tobacco Prior (greater than 1 year)  120 pack years   Place of Living Home     cardiac stent:    Hemorrhoids:    Hypertension:    Cholecystectomy:          Admit Diagnosis:   FALL RT FEMORAL NECK FRACTURE: Onset Date: 14-Jun-2014, Status: Active, Description: FALL RT FEMORAL NECK FRACTURE  Home Medications:  Medication Instructions Status  predniSONE 20 mg tablet take 2 tablets the first day, then one tablet a day for 4 days  Active  pravastatin 80 mg oral tablet 1 tab(s) orally once a day (at bedtime) Active  Aspir 81 81 mg oral tablet, chewable 1 tab(s) orally once a day Active  Vitamin D3  orally  Active  lisinopril 5 mg oral tablet 1 tab(s) orally once a day Active  metoprolol 25 mg oral tablet 1 tab(s) orally 2 times a day Active  Flomax 0.4 mg oral capsule 1 cap(s) orally once a day Active  pravastatin 40 milligram(s) orally once a day (at bedtime) Active  Vitamin D3 1000 intl units oral capsule 1 cap(s) orally once a day Active  albuterol 2.5 mg/3 mL (0.083%) inhalation solution 3 milliliter(s) inhaled 4 times a day, As Needed Active  Aspir 81 81 mg oral tablet, chewable 1 tab(s) orally Monday, Wednesday, and Friday Active  amlodipine 10 mg oral tablet 1  orally once a day  Active   Lab Results:  Hepatic:  04-Mar-16 09:33   Bilirubin, Total 0.7  Alkaline Phosphatase  28  SGPT (ALT) 29  SGOT (AST) 25  Total Protein, Serum 6.4  Albumin, Serum 3.4  Routine Chem:  04-Mar-16 09:33   Glucose, Serum  104  BUN  38  Creatinine (comp)  1.45  Sodium, Serum  134  Potassium, Serum 4.4  Chloride, Serum 103  CO2,  Serum 25  Calcium (Total), Serum 9.3  Anion Gap  6  Osmolality (calc) 278  eGFR (African American)  58  eGFR (Non-African American)  48 (eGFR values <33m/min/1.73 m2 may be an indication of chronic kidney disease (CKD). Calculated eGFR, using the MRDR Study equation, is useful in  patients with stable renal function. The eGFR calculation will not be reliable in acutely ill patients when serum creatinine is changing rapidly. It is not useful in patients on dialysis. The eGFR calculation may not be applicable to patients at the low and high extremes of body sizes, pregnant women, and vegetarians.)  Result Comment Potassium/BUN/Creatinine - Slight hemolysis, interpret results with AST/Total Protein - caution.  Result(s) reported on 13 Jun 2014 at 10:13AM.  Routine Coag:  04-Mar-16 09:33   Prothrombin 13.6 (11.4-15.0 NOTE: New Reference Range  05/09/14)  INR 1.0 (INR reference interval applies to patients on anticoagulant therapy. A single INR therapeutic range for coumarins is not optimal for all indications; however, the suggested range for most indications is 2.0 - 3.0. Exceptions to the INR Reference Range may include: Prosthetic heart valves, acute myocardial infarction, prevention of myocardial infarction, and combinations of aspirin and anticoagulant. The need for a higher or lower target INR must be assessed individually. Reference: The Pharmacology and Management of the Vitamin K  antagonists: the seventh ACCP Conference on Antithrombotic and Thrombolytic Therapy. QMGQQ.7619 Sept:126 (3suppl): [id number]. A HCT value >55% may artifactually increase the PT.  In one study,  the increase was an average of 25%. Reference:  "Effect on Routine and Special Coagulation Testing Values of Citrate Anticoagulant Adjustment in Patients with High HCT Values." American Journal of Clinical Pathology 2006;126:400-405.)  Activated PTT (APTT) 25.5 (A HCT value >55% may artifactually increase  the APTT. In one study, the increase was an average of 19%. Reference: "Effect on Routine and Special Coagulation Testing Values of Citrate Anticoagulant Adjustment in Patients with High HCT Values." American Journal of Clinical Pathology 2006;126:400-405.)  Routine Hem:  04-Mar-16 09:33   WBC (CBC) 8.7  RBC (CBC)  4.34  Hemoglobin (CBC) 14.4  Hematocrit (CBC) 44.4  Platelet Count (CBC)  145 (Result(s) reported on 13 Jun 2014 at 10:07AM.)  MCV  102  MCH 33.1  MCHC 32.4  RDW 14.2   EKG:  EKG Interp. by me   Interpretation EKG shows NSR, 66 bpm, intermediate axis, no significant st/t changes   Radiology Results:  XRay:    04-Mar-16 11:20, Chest Portable Single View  Chest Portable Single View   REASON FOR EXAM:    pre-op  COMMENTS:       PROCEDURE: DXR - DXR PORTABLE CHEST SINGLE VIEW  - Jun 13 2014 11:20AM     CLINICAL DATA:  Preoperative evaluation.  Fall earlier today    EXAM:  PORTABLE CHEST - 1 VIEW    COMPARISON:  February 06, 2014    FINDINGS:  The lungs are clear. Heart size and pulmonary vascularity are  normal. No adenopathy. No pneumothorax. No bone lesions.   IMPRESSION:  No edema or consolidation.      Electronically Signed    By: Lowella Grip III M.D.    On: 03/04/201611:36         Verified By: Leafy Kindle. WOODRUFF, M.D.,    PCN: GI Distress  IVP Dye: Hives  Cortisone: Hives  Vital Signs/Nurse's Notes: **Vital Signs.:   04-Mar-16 13:47  Vital Signs Type Admission  Temperature Temperature (F) 97.5  Celsius 36.3  Temperature Source oral  Pulse Pulse 72  Respirations Respirations 18  Systolic BP Systolic BP 509  Diastolic BP (mmHg) Diastolic BP (mmHg) 74  Mean BP 92  Pulse Ox % Pulse Ox % 97  Pulse Ox Activity Level  At rest  Oxygen Delivery Room Air/ 21 %    Impression 81 year old male with history of CAD s/p MI s/p v-fib - cardiac arrest in 2012, HTN, HLD, PTSD with resultant headache disorder, and BPH who presented to [facility]  today after suffering a mechanical fall in his hosue and suffering a Colles fracture of the distal radius and ulnar styloid and a mildly displaced right femoral neck fracture. Cardiology is consulted for surgical clearance.  1. Surgical clearance:  ----  Acceptable risk for right radial and right hip fracture. no recent anginal sx. No furthervtesting needed  -Last echo 11/2012 showed normal LV function with no wall motion abnormalities.  -He would be at least a moderate risk of non-cardiac surgery given his comorbidities and advanced age. He would have an estimated rrate of MI, PE, v-fib, cardiac arrest, or complete heart block at 6.6%. This was discussed with that patient and his granddaughter.    history of CAD with history of MI s/p ventricular fibrillation and cardiac carrest in 2012. Cardiac cath at that time showed occluded mRCA s/p Coronary Integrity BMS to mRCA. Remaining cath data includes 50% pLAD, 70% LCx, 30% ostial RCA, occluded mRCA s/p BMS as above. He has done well since that time and remains on aspirin 81 mg daily. No ischemic evaluations have been required.    2. Right femoral neck fracture/Colles fracture: -2/2 mechanical fall -Does not sound to be 2/2 orthostasis (has been tolerating Flomax well, and waits 5 min after standing prior to moving per his report) -Will place patient on telemetry will admitted and monitor HR and rhythm  -Per ortho  3. HTN: -Well controlled -Continue home medications  4. HLD: -Pravastatin 80 mg  5. PTSD: -Stable  6. BPH: -Flomax  7. Dispo: -Inpatient rehab   Electronic Signatures: [facility])  (Signed 04-Mar-16 15:11)  Authored: General Aspect/Present Illness, History and Physical Exam, Review of System, Family & Social History, Past Medical History, Home Medications, Labs, EKG , Radiology, Allergies, Vital Signs/Nurse's Notes, Impression/Plan Ida Rogue (MD)  (Signed 05-Mar-16 13:07)  Authored: General Aspect/Present  Illness, History and Physical Exam, Review of System, Family & Social History, Health Issues, Home Medications, EKG , Radiology, Impression/Plan  Co-Signer: General Aspect/Present Illness, History and Physical Exam, Review of System, Family & Social History, Past Medical History, Home Medications, Labs, EKG , Radiology, Allergies, Vital Signs/Nurse's Notes, Impression/Plan   Last Updated: 05-Mar-16 13:07 by Ida Rogue (MD)

## 2014-08-10 NOTE — Consult Note (Signed)
PATIENT NAME:  Daniel Dickson, HOLCOMB MR#:  [id number] DATE OF BIRTH:  02/27/1913  DATE OF CONSULTATION:  06/13/2014  REFERRING PHYSICIAN:   CONSULTING PHYSICIAN:  Kathreen Devoid, MD  REASON FOR CONSULTATION: 1.  Right femoral neck hip fracture, minimally displaced.  2.  Right distal radius fracture, displaced.   HISTORY OF PRESENT ILLNESS: Daniel Dickson is a 81 year old male who explains that he went to get his wallet today to go out for coffee with his friends. He made a turn and lost his balance falling onto his right side. He fell onto a right outstretched hand and then onto his right hip. He called his daughter and was brought to the [facility] Emergency Department. There he was diagnosed with a comminuted and displaced right distal radius fracture and a right femoral neck hip fracture with minimal displacement. He was admitted to the hospitalist service and has been ordered for cardiology consult for preoperative clearance. Orthopedics is consulted for management of his fractures.   PAST MEDICAL HISTORY: Significant for a cardiac arrest in 2012. He is status post cardiac stent placement. He has a history of hemorrhoids, hypertension, and has had a previous cholecystectomy, BPH and hypercholesterolemia.   ALLERGIES: CORTISONE, IVP DYE, PENICILLIN.  HOME MEDICATIONS: Include Vitamin D3, pravastatin 80 mg daily at bedtime, metoprolol 25 mg 1 tablet b.i.d., lisinopril 5 mg 1 tablet daily, Flomax 0.4 mg 1 tablet daily, aspirin 81 mg daily, amlodipine 10 mg once daily.  PHYSICAL EXAMINATION:  GENERAL: The patient is lying in bed. He is alert and oriented. He is able to provide an accurate history. His daughter and granddaughter are at the bedside. The patient's right wrist is in a splint, put on by the Emergency Room staff. He has fingers that are well perfused and he can flex and extend his digits and has intact sensation to light touch in all 5 digits. The splint was not removed today for my  examination. He has a sling on the right arm as well.  RIGHT HIP: The patient's skin is intact. There is no erythema, ecchymosis or swelling. His leg and thigh compartments are soft and compressible. He can flex and extend his toes and dorsiflex and plantarflex his ankle. He has palpable pedal pulses. No calf tenderness or lower leg edema. He had intact motor function throughout the right lower extremity.  RADIOLOGY: AP of the pelvis and right hip demonstrate a minimally displaced fracture of the femoral neck.   His wrist films and forearm films demonstrate a dorsally angulated comminuted fracture of the distal radius with interarticular extension.   His humerus films do not show a proximal humerus fracture.   ASSESSMENT: 1.  Minimally displaced right femoral neck hip fracture.  2.  Dorsally angulated, comminuted distal radius fracture, right wrist.   PLAN: I recommended to Mr. Sawatzky and his family that we proceed with surgical fixation for his right hip with percutaneous pinning. He understands the risks and potential for avascular necrosis or hardware failure which may require further surgery to convert to a hemiarthroplasty versus total hip arthroplasty. I reviewed the risks and benefits of this procedure with him including infection requiring removal of the prosthesis, bleeding requiring blood transfusion, nerve or blood vessel injury, especially injury to the sciatic nerve leading to footdrop. He may require use of an AFO if this occurs. Other risk factors include persistent hip pain, failure to return to ambulation, avascular necrosis, osteoarthritis, hardware failure, malunion, nonunion, and the need for further surgery. Medical risks  include but are not limited to deep vein thrombosis and pulmonary embolism, myocardial infarction, stroke, pneumonia, respiratory failure and death.   I have also recommended closed reduction and casting of the right distal radius fracture. This will be done at  the time of his hip surgery. The risks for this include nerve or blood vessel injury, nonunion, malunion, loss of reduction requiring further intervention including open reduction and internal fixation. The patient and his family understood and agreed with these risks. He is pending cardiology clearance at the time of this dictation. Surgery will proceed when he is cleared medically. I have reviewed the patient's labs and radiographic studies in preparation for this case. I have signed his surgical sites and reviewed the consent forms for surgery and blood with the patient and his family. I answered all their questions.  ____________________________ Kathreen DevoidKevin L. Jamesen Stahnke, MD klk:sb D: 06/13/2014 15:40:40 ET T: 06/13/2014 16:23:30 ET JOB#: [id number]  cc: Kathreen DevoidKevin L. Mayelin Panos, MD, <Dictator> Kathreen DevoidKEVIN L Hudsen Fei MD ELECTRONICALLY SIGNED 06/13/2014 18:31

## 2014-08-10 NOTE — Discharge Summary (Signed)
PATIENT NAME:  Daniel FreshwaterWHITE, Anthoney H MR#:  [id number] DATE OF BIRTH:  [birth year]  DATE OF ADMISSION:  06/13/2014 DATE OF DISCHARGE:  06/17/2014    PRESENTING COMPLAINT: Fall and hip pain.   DISCHARGE DIAGNOSES:  1.  Right femoral neck fracture, right distal radial fracture, status post percutaneous fixation of right femoral neck hip fracture and closed reduction, and casting of right distal radial fracture by Dr. Martha ClanKrasinski.   CODE STATUS: FULL CODE.   MEDICATIONS:  1.  Lisinopril 5 mg p.o. daily. 2.  Amlodipine 10 mg daily.  3.  Metoprolol 25 mg b.i.d.  4.  Flomax 0.4 mg p.o. daily.  5.  Pravastatin 40 mg p.o. daily at bedtime.  6.  Vitamin D3 1000 international units 1 capsule daily.  7.  Albuterol DuoNebs 3 mL 4 times a day as needed.  8.  Aspirin 81 mg Monday, Wednesday, Friday.  9.  Acetaminophen 325 mg 2 tablets every 4 hours as needed.  10. Acetaminophen-hydrocodone 325/5 at 1 tablet every 4 to 6 hourly as needed.  11. Enoxaparin 30 mg subcutaneous every day for 14 days.  12. Ferrous sulfate 325 mg p.o. b.i.d.  13. Senokot 1 tablet b.i.d. as needed for constipation.  14.  Docusate 100 mg b.i.d. as needed.   FOLLOW-UP: 1.  Physical therapy follow up with Dr. Martha ClanKrasinski in 2 to 4 weeks.   2.  Follow up with Dr. Quillian QuinceBliss primary care physician in 2 to 4 weeks.    LABORATORY DATA:  Hemoglobin and hematocrit is 13.1 and 38.3, Carlini count is 9.9. Creatinine is 1.54, potassium is 4.3.   BRIEF SUMMARY OF HOSPITAL COURSE:  [name] is a 81 year old Caucasian gentleman with past medical history of hypertension, comes in after sustaining a fall. He was diagnosed with:  1.  Mechanical fall status post right femoral neck fracture, right distal radius fracture. He is postoperative day 3.  Surgery was performed by Dr. Martha ClanKrasinski. Physical therapy recommended rehabilitation.  [place]. He is improving slowly.  2.  Coronary artery disease status post stent. Resumed aspirin  and statins.  3.  Hypertension. Continued metoprolol, amlodipine and lisinopril.  4.  Benign prostatic hypertrophy on Flomax.  5.  Hyperlipidemia, on statins.  6.  Deep vein thrombosis prophylaxis, per orthopedic on Lovenox.   CODE STATUS: FULL CODE.   TIME SPENT: 40 minutes.     ____________________________ Wylie HailSona A. Allena KatzPatel, MD sap:DT D: 06/17/2014 11:47:42 ET T: 06/17/2014 12:24:35 ET JOB#: [id number]  cc: Chaniyah Jahr A. Allena KatzPatel, MD, <Dictator> Kathreen DevoidKevin L. Krasinski, MD L. Clayborn BignessKatherine Bliss, MD  Willow OraSONA A Olsen Mccutchan MD ELECTRONICALLY SIGNED 06/21/2014 14:28

## 2014-08-11 ENCOUNTER — Ambulatory Visit: Payer: Medicare FFS | Admitting: Cardiovascular Disease

## 2014-09-03 ENCOUNTER — Ambulatory Visit (INDEPENDENT_AMBULATORY_CARE_PROVIDER_SITE_OTHER): Payer: Medicare PPO | Admitting: Cardiovascular Disease

## 2014-09-03 ENCOUNTER — Encounter: Payer: Self-pay | Admitting: Cardiovascular Disease

## 2014-09-03 VITALS — BP 124/62 | HR 69 | Ht 66.0 in | Wt 148.5 lb

## 2014-09-03 DIAGNOSIS — R0602 Shortness of breath: Secondary | ICD-10-CM | POA: Diagnosis not present

## 2014-09-03 DIAGNOSIS — S72001S Fracture of unspecified part of neck of right femur, sequela: Secondary | ICD-10-CM

## 2014-09-03 DIAGNOSIS — W19XXXS Unspecified fall, sequela: Secondary | ICD-10-CM

## 2014-09-03 DIAGNOSIS — W19XXXA Unspecified fall, initial encounter: Secondary | ICD-10-CM | POA: Insufficient documentation

## 2014-09-03 DIAGNOSIS — I2510 Atherosclerotic heart disease of native coronary artery without angina pectoris: Secondary | ICD-10-CM | POA: Diagnosis not present

## 2014-09-03 DIAGNOSIS — S72009A Fracture of unspecified part of neck of unspecified femur, initial encounter for closed fracture: Secondary | ICD-10-CM | POA: Insufficient documentation

## 2014-09-03 DIAGNOSIS — D649 Anemia, unspecified: Secondary | ICD-10-CM

## 2014-09-03 DIAGNOSIS — F4310 Post-traumatic stress disorder, unspecified: Secondary | ICD-10-CM | POA: Insufficient documentation

## 2014-09-03 DIAGNOSIS — R600 Localized edema: Secondary | ICD-10-CM | POA: Insufficient documentation

## 2014-09-03 DIAGNOSIS — I10 Essential (primary) hypertension: Secondary | ICD-10-CM

## 2014-09-03 MED ORDER — AMLODIPINE BESYLATE 5 MG PO TABS: 5.0000 mg | ORAL_TABLET | Freq: Every day | ORAL | Status: DC

## 2014-09-03 MED ORDER — FUROSEMIDE 20 MG PO TABS: 20.0000 mg | ORAL_TABLET | Freq: Every day | ORAL | Status: DC | PRN

## 2014-09-03 NOTE — Assessment & Plan Note (Signed)
He reports having low pressures at home, prior fall with associated dizziness in March 2016 leading to his fracture. Out of concern for his symptoms, we have suggested he decrease his amlodipine down to 5 mg daily with close monitoring of his symptoms and blood pressures

## 2014-09-03 NOTE — Assessment & Plan Note (Signed)
I'm concerned his fall could have been secondary to orthostasis. Recommended we decrease the amlodipine down to 5 mill grams daily

## 2014-09-03 NOTE — Assessment & Plan Note (Signed)
Etiology of his leg edema is unclear, concerning for new acute diastolic CHF. Unable to exclude side effect from the amlodipine  As above we will decrease amlodipine down to 5 mg daily. We have suggested he take Lasix 20 mg once or twice per week only for leg edema. Suggested we proceed cautiously. He reports developing leg edema after being in the hospital and rehabilitation. Suspect he may have had IV fluids that may have contributed. He denies any significant shortness of breath with exertion but is relatively sedentary following his fall and fractures.

## 2014-09-03 NOTE — Progress Notes (Signed)
Patient ID: Daniel Dickson, male    DOB: 04/27/1912, 81 y.o.   MRN: [id number]  HPI Comments: 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 Notes indicating ventricular fibrillation though the details are unavailable. Previous symptoms of shortness of breath. He presents for routine followup of his coronary artery disease  He continues to take care of his son who is age 70 with Down syndrome. He cooks many of his meals.  In follow-up, he presents with family. He reports having a fall 06/12/2014. He broke his hip and his wrist, had to spend a period of time in rehabilitation. He reports having lightheadedness at the time.  Since then he has been doing better but reports blood pressure has been running low at times. Was often low in rehabilitation. Often 100 systolic or less.  Continues to have tachycardia at 2 or 3 in the morning lasting 30 minutes which he attributes to bad dreams. In the past he has reported nightmares, vivid dreams, possible PTSD  He does report having worsening leg edema. This was brought to his attention by family members unclear if it is worse since the hospital and rehabilitation. Notes did not mention any edema on his prior clinic visits   Labwork discussed with him showing anemia  EKG on today's visit shows normal sinus rhythm with rate 69 bpm, no significant ST or T-wave changes  Prior past medical history significant chest pain several years ago when he had his heart attack. Cardiac catheterization report details a 30% ostial RCA disease, 100% occluded RCA, 70% mid left circumflex disease, 50% proximal LAD disease  Prior carotid ultrasound in March 2011 showing a slight plaque bilateral with no significant disease  Echocardiogram dated May 2011 shows normal  ejection fraction, LVH, mild MR and TR Previous total cholesterol 145, LDL 66, HDL 56, creatinine 1.54, BUN 31, normal LFTs    Allergies  Allergen Reactions  . Cortisone   . Dye Fdc Red [Red Dye]   . Penicillins     Current Outpatient Prescriptions on File Prior to Visit  Medication Sig Dispense Refill  . acetaminophen (TYLENOL) 500 MG tablet Take 500 mg by mouth as needed for pain.    Marland Kitchen aspirin 81 MG tablet Take 81 mg by mouth daily.    . Cholecalciferol (VITAMIN D-3 PO) Take by mouth.    Marland Kitchen lisinopril (PRINIVIL,ZESTRIL) 5 MG tablet Take 5 mg by mouth daily.    . metoprolol tartrate (LOPRESSOR) 25 MG tablet Take 25 mg by mouth 2 (two) times daily.    . nitroGLYCERIN (NITROSTAT) 0.4 MG SL tablet Place 0.4 mg under the tongue every 5 (five) minutes as needed for chest pain.    . pravastatin (PRAVACHOL) 80 MG tablet Take 80 mg by mouth daily.     No current facility-administered medications on file prior to visit.    Past Medical History  Diagnosis Date  . Hypertension     Hx  . PTSD (post-traumatic stress disorder)     with headaches  . Mixed hyperlipidemia   . MI (myocardial infarction)   . Coronary artery disease     Past Surgical History  Procedure Laterality Date  . Hemorrhoid surgery    . Cholecystectomy    . Cataract extraction    . Vascular surgery  stent R femoral artery  . Coronary angioplasty      with stent; [facility]  . Hip surgery    . Hand surgery      Social History  reports that he has quit smoking. His smoking use included Cigarettes. He has a 120 pack-year smoking history. He does not have any smokeless tobacco history on file. He reports that he drinks alcohol. He reports that he does not use illicit drugs.  Family History Family history is unknown by patient.   Review of Systems  Constitutional: Negative.   HENT: Negative.   Respiratory: Negative.   Cardiovascular: Positive for leg swelling.  Musculoskeletal: Positive for arthralgias and  gait problem.       Right wrist discomfort  Neurological: Negative.   Psychiatric/Behavioral: Negative.   All other systems reviewed and are negative.   BP 124/62 mmHg  Pulse 69  Ht 5\' 6"  (1.676 m)  Wt 148 lb 8 oz (67.359 kg)  BMI 23.98 kg/m2  Physical Exam  Constitutional: He is oriented to person, place, and time. He appears well-developed and well-nourished.  HENT:  Head: Normocephalic.  Nose: Nose normal.  Mouth/Throat: Oropharynx is clear and moist.  Eyes: Conjunctivae are normal. Pupils are equal, round, and reactive to light.  Neck: Normal range of motion. Neck supple. No JVD present.  Cardiovascular: Normal rate, regular rhythm, S1 normal, S2 normal, normal heart sounds and intact distal pulses.  Exam reveals no gallop and no friction rub.   No murmur heard. Pulmonary/Chest: Effort normal and breath sounds normal. No respiratory distress. He has no wheezes. He has no rales. He exhibits no tenderness.  Abdominal: Soft. Bowel sounds are normal. He exhibits no distension. There is no tenderness.  Musculoskeletal: Normal range of motion. He exhibits no edema or tenderness.  Lymphadenopathy:    He has no cervical adenopathy.  Neurological: He is alert and oriented to person, place, and time. Coordination normal.  Skin: Skin is warm and dry. No rash noted. No erythema.  Psychiatric: He has a normal mood and affect. His behavior is normal. Judgment and thought content normal.      Assessment and Plan   Nursing note and vitals reviewed.

## 2014-09-03 NOTE — Assessment & Plan Note (Signed)
We spent some time discussing his vivid dreams, tachycardia typically between 2:00 and 3 AM. We'll continue metoprolol. He wakes up with pounding heartbeat. This has been ongoing for many years. Less likely arrhythmia

## 2014-09-03 NOTE — Patient Instructions (Addendum)
You are doing well.  Please cut the amlodipine down to 1/2 pill per day (5 mg in the morning) Blood pressure is low.  Please take a water pill (lasix/furosemide) once or twice a week as needed for leg edema Take with a banana No lasix needed once edema resolves  Please call us if you have new issues that need to be addressed before your next appt.  Your physician wants you to follow-up in: 6 months.  You will receive a reminder letter in the mail two months in advance. If you don't receive a letter, please call our office to schedule the follow-up appointment.

## 2014-09-03 NOTE — Assessment & Plan Note (Signed)
March 2016 following a fall. Managed by orthopedics. Currently ambulating

## 2014-12-01 ENCOUNTER — Ambulatory Visit
Admission: RE | Admit: 2014-12-01 | Discharge: 2014-12-01 | Disposition: A | Discharge status: Home / Self Care | Payer: Medicare PPO | Source: Ambulatory Visit | Attending: Family Medicine | Admitting: Family Medicine

## 2014-12-01 ENCOUNTER — Other Ambulatory Visit: Payer: Self-pay | Admitting: Family Medicine

## 2014-12-01 DIAGNOSIS — J449 Chronic obstructive pulmonary disease, unspecified: Secondary | ICD-10-CM

## 2014-12-08 DIAGNOSIS — J449 Chronic obstructive pulmonary disease, unspecified: Secondary | ICD-10-CM | POA: Insufficient documentation

## 2015-01-11 ENCOUNTER — Inpatient Hospital Stay
Admission: EM | Admit: 2015-01-11 | Discharge: 2015-01-12 | DRG: 189 | Disposition: A | Discharge status: Home / Self Care | Payer: Medicare PPO | Attending: Internal Medicine | Admitting: Internal Medicine

## 2015-01-11 ENCOUNTER — Emergency Department: Payer: Medicare PPO

## 2015-01-11 ENCOUNTER — Encounter: Payer: Self-pay | Admitting: Emergency Medicine

## 2015-01-11 DIAGNOSIS — J9601 Acute respiratory failure with hypoxia: Secondary | ICD-10-CM | POA: Diagnosis present

## 2015-01-11 DIAGNOSIS — Z9049 Acquired absence of other specified parts of digestive tract: Secondary | ICD-10-CM | POA: Diagnosis not present

## 2015-01-11 DIAGNOSIS — Z79899 Other long term (current) drug therapy: Secondary | ICD-10-CM | POA: Diagnosis not present

## 2015-01-11 DIAGNOSIS — I2510 Atherosclerotic heart disease of native coronary artery without angina pectoris: Secondary | ICD-10-CM | POA: Diagnosis present

## 2015-01-11 DIAGNOSIS — R0902 Hypoxemia: Secondary | ICD-10-CM

## 2015-01-11 DIAGNOSIS — Z7982 Long term (current) use of aspirin: Secondary | ICD-10-CM

## 2015-01-11 DIAGNOSIS — D696 Thrombocytopenia, unspecified: Secondary | ICD-10-CM | POA: Diagnosis present

## 2015-01-11 DIAGNOSIS — I129 Hypertensive chronic kidney disease with stage 1 through stage 4 chronic kidney disease, or unspecified chronic kidney disease: Secondary | ICD-10-CM | POA: Diagnosis present

## 2015-01-11 DIAGNOSIS — Z888 Allergy status to other drugs, medicaments and biological substances status: Secondary | ICD-10-CM

## 2015-01-11 DIAGNOSIS — Z91041 Radiographic dye allergy status: Secondary | ICD-10-CM

## 2015-01-11 DIAGNOSIS — Z87891 Personal history of nicotine dependence: Secondary | ICD-10-CM

## 2015-01-11 DIAGNOSIS — Z955 Presence of coronary angioplasty implant and graft: Secondary | ICD-10-CM | POA: Diagnosis not present

## 2015-01-11 DIAGNOSIS — N183 Chronic kidney disease, stage 3 (moderate): Secondary | ICD-10-CM | POA: Diagnosis present

## 2015-01-11 DIAGNOSIS — Z880 Allergy status to penicillin: Secondary | ICD-10-CM | POA: Diagnosis not present

## 2015-01-11 DIAGNOSIS — N400 Enlarged prostate without lower urinary tract symptoms: Secondary | ICD-10-CM | POA: Diagnosis present

## 2015-01-11 DIAGNOSIS — Z825 Family history of asthma and other chronic lower respiratory diseases: Secondary | ICD-10-CM | POA: Diagnosis not present

## 2015-01-11 DIAGNOSIS — J441 Chronic obstructive pulmonary disease with (acute) exacerbation: Secondary | ICD-10-CM | POA: Diagnosis present

## 2015-01-11 DIAGNOSIS — E782 Mixed hyperlipidemia: Secondary | ICD-10-CM | POA: Diagnosis present

## 2015-01-11 DIAGNOSIS — I252 Old myocardial infarction: Secondary | ICD-10-CM

## 2015-01-11 DIAGNOSIS — Z9889 Other specified postprocedural states: Secondary | ICD-10-CM

## 2015-01-11 DIAGNOSIS — Z9849 Cataract extraction status, unspecified eye: Secondary | ICD-10-CM

## 2015-01-11 HISTORY — DX: Chronic obstructive pulmonary disease, unspecified: J44.9

## 2015-01-11 HISTORY — DX: Anxiety disorder, unspecified: F41.9

## 2015-01-11 LAB — BASIC METABOLIC PANEL
ANION GAP: 5 (ref 5–15)
BUN: 21 mg/dL — AB (ref 6–20)
CALCIUM: 9.5 mg/dL (ref 8.9–10.3)
CO2: 27 mmol/L (ref 22–32)
Chloride: 104 mmol/L (ref 101–111)
Creatinine, Ser: 1.27 mg/dL — ABNORMAL HIGH (ref 0.61–1.24)
GFR calc Af Amer: 52 mL/min — ABNORMAL LOW (ref >60)
GFR, EST NON AFRICAN AMERICAN: 45 mL/min — AB (ref >60)
GLUCOSE: 109 mg/dL — AB (ref 65–99)
Potassium: 4.2 mmol/L (ref 3.5–5.1)
SODIUM: 136 mmol/L (ref 135–145)

## 2015-01-11 LAB — CBC
HCT: 43.5 % (ref 40.0–52.0)
Hemoglobin: 14.5 g/dL (ref 13.0–18.0)
MCH: 33.1 pg (ref 26.0–34.0)
MCHC: 33.3 g/dL (ref 32.0–36.0)
MCV: 99.5 fL (ref 80.0–100.0)
PLATELETS: 146 10*3/uL — AB (ref 150–440)
RBC: 4.37 MIL/uL — AB (ref 4.40–5.90)
RDW: 14.2 % (ref 11.5–14.5)
WBC: 8.3 10*3/uL (ref 3.8–10.6)

## 2015-01-11 LAB — TROPONIN I: Troponin I: <0.03 ng/mL (ref <0.031)

## 2015-01-11 MED ORDER — LISINOPRIL 5 MG PO TABS
5.0000 mg | ORAL_TABLET | Freq: Every day | ORAL | Status: DC
  Administered 2015-01-11: 5 mg via ORAL
  Filled 2015-01-11: qty 1

## 2015-01-11 MED ORDER — LEVOFLOXACIN 500 MG PO TABS
500.0000 mg | ORAL_TABLET | Freq: Once | ORAL | Status: AC
  Administered 2015-01-11: 500 mg via ORAL
  Filled 2015-01-11: qty 1

## 2015-01-11 MED ORDER — IPRATROPIUM-ALBUTEROL 0.5-2.5 (3) MG/3ML IN SOLN
3.0000 mL | Freq: Once | RESPIRATORY_TRACT | Status: AC
  Administered 2015-01-11: 3 mL via RESPIRATORY_TRACT
  Filled 2015-01-11: qty 3

## 2015-01-11 MED ORDER — IPRATROPIUM-ALBUTEROL 0.5-2.5 (3) MG/3ML IN SOLN
3.0000 mL | Freq: Four times a day (QID) | RESPIRATORY_TRACT | Status: DC
  Administered 2015-01-11 – 2015-01-12 (×3): 3 mL via RESPIRATORY_TRACT
  Filled 2015-01-11 (×3): qty 3

## 2015-01-11 MED ORDER — ENOXAPARIN SODIUM 30 MG/0.3ML ~~LOC~~ SOLN
30.0000 mg | SUBCUTANEOUS | Status: DC
  Administered 2015-01-11: 30 mg via SUBCUTANEOUS
  Filled 2015-01-11: qty 0.3

## 2015-01-11 MED ORDER — ACETAMINOPHEN 650 MG RE SUPP: 650.0000 mg | Freq: Four times a day (QID) | RECTAL | Status: DC | PRN

## 2015-01-11 MED ORDER — IPRATROPIUM-ALBUTEROL 0.5-2.5 (3) MG/3ML IN SOLN
3.0000 mL | Freq: Once | RESPIRATORY_TRACT | Status: AC
Start: 2015-01-11 — End: 2015-01-11
  Administered 2015-01-11: 3 mL via RESPIRATORY_TRACT

## 2015-01-11 MED ORDER — IPRATROPIUM-ALBUTEROL 0.5-2.5 (3) MG/3ML IN SOLN
RESPIRATORY_TRACT | Status: AC
  Administered 2015-01-11: 3 mL via RESPIRATORY_TRACT
  Filled 2015-01-11: qty 3

## 2015-01-11 MED ORDER — NITROGLYCERIN 0.4 MG SL SUBL: 0.4000 mg | SUBLINGUAL_TABLET | SUBLINGUAL | Status: DC | PRN

## 2015-01-11 MED ORDER — PREDNISONE 20 MG PO TABS
40.0000 mg | ORAL_TABLET | Freq: Every day | ORAL | Status: DC
  Administered 2015-01-12: 40 mg via ORAL
  Filled 2015-01-11: qty 2

## 2015-01-11 MED ORDER — FERROUS SULFATE 325 (65 FE) MG PO TABS
325.0000 mg | ORAL_TABLET | Freq: Two times a day (BID) | ORAL | Status: DC
  Administered 2015-01-11 – 2015-01-12 (×2): 325 mg via ORAL
  Filled 2015-01-11 (×2): qty 1

## 2015-01-11 MED ORDER — TAMSULOSIN HCL 0.4 MG PO CAPS
0.4000 mg | ORAL_CAPSULE | Freq: Every day | ORAL | Status: DC
  Administered 2015-01-12: 0.4 mg via ORAL
  Filled 2015-01-11: qty 1

## 2015-01-11 MED ORDER — BENZONATATE 100 MG PO CAPS
100.0000 mg | ORAL_CAPSULE | Freq: Three times a day (TID) | ORAL | Status: DC | PRN
  Administered 2015-01-11 – 2015-01-12 (×2): 100 mg via ORAL
  Filled 2015-01-11 (×2): qty 1

## 2015-01-11 MED ORDER — ACETAMINOPHEN 325 MG PO TABS: 650.0000 mg | ORAL_TABLET | Freq: Four times a day (QID) | ORAL | Status: DC | PRN

## 2015-01-11 MED ORDER — ASPIRIN EC 81 MG PO TBEC
81.0000 mg | DELAYED_RELEASE_TABLET | Freq: Every day | ORAL | Status: DC
  Administered 2015-01-11 – 2015-01-12 (×2): 81 mg via ORAL
  Filled 2015-01-11 (×2): qty 1

## 2015-01-11 MED ORDER — PRAVASTATIN SODIUM 20 MG PO TABS
80.0000 mg | ORAL_TABLET | Freq: Every day | ORAL | Status: DC
Start: 2015-01-11 — End: 2015-01-12
  Administered 2015-01-11: 80 mg via ORAL
  Filled 2015-01-11: qty 4

## 2015-01-11 MED ORDER — AMLODIPINE BESYLATE 5 MG PO TABS
5.0000 mg | ORAL_TABLET | Freq: Every day | ORAL | Status: DC
  Administered 2015-01-11 – 2015-01-12 (×2): 5 mg via ORAL
  Filled 2015-01-11 (×2): qty 1

## 2015-01-11 MED ORDER — IPRATROPIUM-ALBUTEROL 0.5-2.5 (3) MG/3ML IN SOLN
3.0000 mL | Freq: Once | RESPIRATORY_TRACT | Status: AC
Start: 2015-01-11 — End: 2015-01-11
  Administered 2015-01-11: 3 mL via RESPIRATORY_TRACT
  Filled 2015-01-11: qty 3

## 2015-01-11 MED ORDER — ALBUTEROL SULFATE (2.5 MG/3ML) 0.083% IN NEBU
INHALATION_SOLUTION | RESPIRATORY_TRACT | Status: AC
  Filled 2015-01-11: qty 3

## 2015-01-11 MED ORDER — INFLUENZA VAC SPLIT QUAD 0.5 ML IM SUSY
0.5000 mL | PREFILLED_SYRINGE | INTRAMUSCULAR | Status: AC
  Administered 2015-01-12: 0.5 mL via INTRAMUSCULAR
  Filled 2015-01-11: qty 0.5

## 2015-01-11 MED ORDER — BUDESONIDE 0.25 MG/2ML IN SUSP
0.2500 mg | Freq: Two times a day (BID) | RESPIRATORY_TRACT | Status: DC
  Administered 2015-01-11 – 2015-01-12 (×2): 0.25 mg via RESPIRATORY_TRACT
  Filled 2015-01-11 (×2): qty 2

## 2015-01-11 MED ORDER — LEVOFLOXACIN 250 MG PO TABS
250.0000 mg | ORAL_TABLET | Freq: Every day | ORAL | Status: DC
  Administered 2015-01-12: 250 mg via ORAL
  Filled 2015-01-11: qty 1

## 2015-01-11 MED ORDER — ENOXAPARIN SODIUM 40 MG/0.4ML ~~LOC~~ SOLN: 40.0000 mg | SUBCUTANEOUS | Status: DC

## 2015-01-11 MED ORDER — LEVOFLOXACIN 500 MG PO TABS
500.0000 mg | ORAL_TABLET | Freq: Every day | ORAL | Status: DC
Start: 2015-01-11 — End: 2015-01-11

## 2015-01-11 MED ORDER — METOPROLOL TARTRATE 25 MG PO TABS
25.0000 mg | ORAL_TABLET | Freq: Two times a day (BID) | ORAL | Status: DC
  Administered 2015-01-11 – 2015-01-12 (×3): 25 mg via ORAL
  Filled 2015-01-11 (×3): qty 1

## 2015-01-11 MED ORDER — PREDNISONE 20 MG PO TABS
60.0000 mg | ORAL_TABLET | Freq: Once | ORAL | Status: AC
  Administered 2015-01-11: 60 mg via ORAL
  Filled 2015-01-11: qty 3

## 2015-01-11 MED ORDER — LORATADINE 10 MG PO TABS
10.0000 mg | ORAL_TABLET | Freq: Every day | ORAL | Status: DC
  Administered 2015-01-12: 10 mg via ORAL
  Filled 2015-01-11: qty 1

## 2015-01-11 NOTE — Progress Notes (Signed)
MEDICATION RELATED NOTE - INITIAL   Pharmacy Consult for Anticoag Monitoring and Levaquin Renal Adjustment Indication: COPD exacerbation  Allergies  Allergen Reactions  . Cortisone Other (See Comments)    Unknown reaction  . Dye Fdc Red [Red Dye] Other (See Comments)    Unknown reaction.  . Peanut Oil     Other reaction(s): Unknown  . Peanut-Containing Drug Products     Other reaction(s): Unknown  . Penicillins Other (See Comments)    Patient not sure of the reaction. Has patient had a PCN reaction causing immediate rash, facial/tongue/throat swelling, SOB or lightheadedness with hypotension: No patient not sure of reaction. Has patient had a PCN reaction causing severe rash involving mucus membranes or skin necrosis: No patient not sure of reaction. Has patient had a PCN reaction that required hospitalization No patient not sure of reaction Has patient had a PCN reaction occurring with    Patient Measurements: Height:  (167.6 cm) Weight: 145 lb (65.772 kg) IBW/kg (Calculated) : 63.8  Vital Signs: Temp: 98.2 F (36.8 C) (10/02 0913) Temp Source: Oral (10/02 0913) BP: 165/96 mmHg (10/02 1445) Pulse Rate: 100 (10/02 1445) Intake/Output from previous day:   Intake/Output from this shift:    Labs:  Recent Labs  01/11/15 0923  WBC 8.3  HGB 14.5  HCT 43.5  PLT 146*  CREATININE 1.27*   Estimated Creatinine Clearance: 27.9 mL/min (by C-G formula based on Cr of 1.27).   Microbiology: No results found for this or any previous visit (from the past 720 hour(s)).  Medical History: Past Medical History  Diagnosis Date  . Hypertension     Hx  . PTSD (post-traumatic stress disorder)     with headaches  . Mixed hyperlipidemia   . MI (myocardial infarction) (HCC)   . Coronary artery disease   . COPD (chronic obstructive pulmonary disease) (HCC)   . Anxiety     Medications:  Scheduled:  . amLODipine  5 mg Oral Daily  . aspirin EC  81 mg Oral Daily  .  budesonide (PULMICORT) nebulizer solution  0.25 mg Nebulization BID  . enoxaparin (LOVENOX) injection  30 mg Subcutaneous Q24H  . ferrous sulfate  325 mg Oral BID WC  . ipratropium-albuterol  3 mL Nebulization Q6H  . [START ON 01/12/2015] levofloxacin  250 mg Oral Daily  . levofloxacin  500 mg Oral Once  . lisinopril  5 mg Oral Daily  . [START ON 01/12/2015] loratadine  10 mg Oral Daily  . metoprolol tartrate  25 mg Oral BID  . pravastatin  80 mg Oral Daily  . [START ON 01/12/2015] predniSONE  40 mg Oral Q breakfast  . [START ON 01/12/2015] tamsulosin  0.4 mg Oral Daily   Infusions:    Assessment: Patient admitted for COPD exacerbation. Estimated CrCl of 27.79ml/min  Plan:  Will renally adjust Lovenox to  SQ q24h. Will renally adjust Levaquin to  PO once followed by  PO daily.  Clovia Cuff, PharmD, BCPS 01/11/2015 3:51 PM

## 2015-01-11 NOTE — ED Provider Notes (Signed)
[facility] Emergency Department Provider Note   ____________________________________________  Time seen: 12 PM I have reviewed the triage vital signs and the triage nursing note.  HISTORY  Chief Complaint Shortness of Breath   Historian Patient, son and daughter-in-law  HPI Daniel Dickson is a 81 y.o. male who is here for trouble breathing which she describes as wheezing. This has been increasing and getting worse over partially 3 days associated with some congestion. Symptoms are worse when he is walking. Symptoms are moderate. He used albuterol nebulizer about every for hours yesterday. He used at 5 AM in the morning and his exam with only minimal relief and psychiatric to the emergency department for evaluation. He has not had a fever. No productive sputum. No chest pain. No nausea vomiting or diarrhea. He is functionally very independent.    Past Medical History  Diagnosis Date  . Hypertension     Hx  . PTSD (post-traumatic stress disorder)     with headaches  . Mixed hyperlipidemia   . MI (myocardial infarction) (HCC)   . Coronary artery disease   . COPD (chronic obstructive pulmonary disease) [facility])     Patient Active Problem List   Diagnosis Date Noted  . Fall 09/03/2014  . Hip fracture (HCC) 09/03/2014  . PTSD (post-traumatic stress disorder) 09/03/2014  . Bilateral leg edema 09/03/2014  . SOB (shortness of breath) 10/31/2012  . Essential hypertension 10/31/2012  . CAD (coronary artery disease) 10/31/2012  . Hyperlipidemia 10/31/2012  . Tachycardia 10/31/2012    Past Surgical History  Procedure Laterality Date  . Hemorrhoid surgery    . Cholecystectomy    . Cataract extraction    . Vascular surgery      stent R femoral artery  . Coronary angioplasty      with stent; [facility]  . Hip surgery    . Hand surgery      Current Outpatient Rx  Name  Route  Sig  Dispense  Refill  . acetaminophen (TYLENOL) 500 MG tablet   Oral   Take  500 mg by mouth as needed for pain.         Marland Kitchen amLODipine (NORVASC) 5 MG tablet   Oral   Take 1 tablet (5 mg total) by mouth daily.   90 tablet   4   . aspirin 81 MG tablet   Oral   Take 81 mg by mouth daily.         . Cholecalciferol (VITAMIN D-3 PO)   Oral   Take by mouth.         . furosemide (LASIX) 20 MG tablet   Oral   Take 1 tablet (20 mg total) by mouth daily as needed.   90 tablet   3   . lisinopril (PRINIVIL,ZESTRIL) 5 MG tablet   Oral   Take 5 mg by mouth daily.         . metoprolol tartrate (LOPRESSOR) 25 MG tablet   Oral   Take 25 mg by mouth 2 (two) times daily.         . nitroGLYCERIN (NITROSTAT) 0.4 MG SL tablet   Sublingual   Place 0.4 mg under the tongue every 5 (five) minutes as needed for chest pain.         . pravastatin (PRAVACHOL) 80 MG tablet   Oral   Take 80 mg by mouth daily.           Allergies Cortisone; Dye fdc red; and Penicillins  Family History  Problem Relation Age of Onset  . Family history unknown: Yes    Social History Social History  Substance Use Topics  . Smoking status: Former Smoker -- 3.00 packs/day for 40 years    Types: Cigarettes  . Smokeless tobacco: None  . Alcohol Use: Yes     Comment: wine at night.     Review of Systems  Constitutional: Negative for fever. Eyes: Negative for visual changes. ENT: Negative for sore throat. Cardiovascular: Negative for chest pain. Respiratory: Positive for shortness of breath due to wheezing. Gastrointestinal: Negative for abdominal pain, vomiting and diarrhea. Genitourinary: Negative for dysuria. Musculoskeletal: Negative for back pain. Skin: Negative for rash. Neurological: Negative for headache. 10 point Review of Systems otherwise negative ____________________________________________   PHYSICAL EXAM:  VITAL SIGNS: ED Triage Vitals  Enc Vitals Group     BP 01/11/15 0913 148/80 mmHg     Pulse Rate 01/11/15 0913 89     Resp 01/11/15 0913 18      Temp 01/11/15 0913 98.2 F (36.8 C)     Temp Source 01/11/15 0913 Oral     SpO2 01/11/15 0913 99 %     Weight 01/11/15 0913 145 lb (65.772 kg)     Height 01/11/15 0913  (1.676 m)     Head Cir --      Peak Flow --      Pain Score --      Pain Loc --      Pain Edu? --      Excl. in GC? --      Constitutional: Alert and oriented. Well appearing and in no distress. Slightly hard of hearing. Eyes: Conjunctivae are normal. PERRL. Normal extraocular movements. ENT   Head: Normocephalic and atraumatic.   Nose: No congestion/rhinnorhea.   Mouth/Throat: Mucous membranes are moist.   Neck: No stridor. Cardiovascular/Chest: Normal rate, regular rhythm.  No murmurs, rubs, or gallops. Respiratory: Normal respiratory effort without tachypnea nor retractions. No wheezing at rest. With forced exhalation he does have wheezing and when he coughs to show me what makes him short of breath he goes into a wheezing fit. Gastrointestinal: Soft. No distention, no guarding, no rebound. Nontender   Genitourinary/rectal:Deferred Musculoskeletal: Nontender with normal range of motion in all extremities. No joint effusions.  No lower extremity tenderness.  No edema. Neurologic:  Normal speech and language. No gross or focal neurologic deficits are appreciated. Skin:  Skin is warm, dry and intact. No rash noted. Psychiatric: Mood and affect are normal. Speech and behavior are normal. Patient exhibits appropriate insight and judgment.  ____________________________________________   EKG I, Governor Rooks, MD, the attending physician have personally viewed and interpreted all ECGs.  89 beats minute. Normal sinus rhythm. Right axis deviation. Nonspecific ST and T-wave. Narrow QRS. ____________________________________________  LABS (pertinent positives/negatives)  Basic metabolic panel significant for BUN 21 and creatinine 1.27 neck sign troponin less than 0.03 Tally blood count 8.3 and  hemoglobin 14.5 and platelet count 146  ____________________________________________  RADIOLOGY All Xrays were viewed by me. Imaging interpreted by Radiologist.  Chest x-ray two-view:   IMPRESSION: Lungs hyperexpanded without frank edema or consolidation. Mild scarring in the lung bases. Atherosclerotic calcification noted throughout aorta. Heart size normal. __________________________________________  PROCEDURES  Procedure(s) performed: None  Critical Care performed: None  ____________________________________________   ED COURSE / ASSESSMENT AND PLAN  CONSULTATIONS: None  Pertinent labs & imaging results that were available during my care of the patient were reviewed by me and  considered in my medical decision making (see chart for details).   Patient has stable triage vital signs and his overall very well-appearing. Although he is 81 years old, he is functional pretty well, doing well postoperatively from a hip replacement about 6 months ago and lives with his handicapped son.  He does have COPD and was recently started on Spiriva as an addition to albuterol. He has no hypoxia, no fever, chest x-ray does not indicate a pneumonia. His symptoms are not symptomatically for pneumonia. I do not suspect cardiac etiology. I'm going to treat him for COPD exacerbation by adding prednisone by mouth.   On reexamination of patient after DuoNeb treatment here in the emergency department, patient was unable to tolerate walking without severe wheezing and shortness of breath, and drop in oxygen saturation from 96% on room air at rest to 87% room air walking with difficulty. I discussed with the family admission for treatment of COPD exacerbation.   Patient / Family / Caregiver informed of clinical course, medical decision-making process, and agree with plan.   ___________________________________________   FINAL CLINICAL IMPRESSION(S) / ED DIAGNOSES   Final diagnoses:  COPD  exacerbation (HCC)  Hypoxia       Governor Rooks, MD 01/11/15 1328

## 2015-01-11 NOTE — H&P (Signed)
[facility] [place] [facility]   PATIENT NAME: Daniel Dickson    MR#:  [id number]  DATE OF BIRTH:  02-05-1913  DATE OF ADMISSION:  01/11/2015  PRIMARY CARE PHYSICIAN: [name], MD   REQUESTING/REFERRING PHYSICIAN: Dr. Governor Rooks  CHIEF COMPLAINT:   Chief Complaint  Patient presents with  . Shortness of Breath    HISTORY OF PRESENT ILLNESS:  Daniel Dickson  is a 81 y.o. male with a known history of COPD, hypertension, hyperlipidemia presents with shortness of breath for 2 days with wheezing. He is not feeling well and he's feeling out of breath. He is coughing up whitish phlegm. 2 weeks ago he did get a new medication for COPD called Spiriva. In the ER he was found to be wheezing and when they walked him around his pulse ox dropped down to 89%.  PAST MEDICAL HISTORY:   Past Medical History  Diagnosis Date  . Hypertension     Hx  . PTSD (post-traumatic stress disorder)     with headaches  . Mixed hyperlipidemia   . MI (myocardial infarction) (HCC)   . Coronary artery disease   . COPD (chronic obstructive pulmonary disease) (HCC)   . Anxiety     PAST SURGICAL HISTORY:   Past Surgical History  Procedure Laterality Date  . Hemorrhoid surgery    . Cholecystectomy    . Cataract extraction    . Vascular surgery      stent R femoral artery  . Coronary angioplasty      with stent; [facility]  . Hip surgery    . Hand surgery      SOCIAL HISTORY:   Social History  Substance Use Topics  . Smoking status: Former Smoker -- 3.00 packs/day for 40 years    Types: Cigarettes  . Smokeless tobacco: Not on file  . Alcohol Use: Yes     Comment: wine at night.     FAMILY HISTORY:   Family History  Problem Relation Age of Onset  . COPD Father     DRUG ALLERGIES:   Allergies  Allergen Reactions  . Cortisone   . Dye Fdc Red [Red Dye]   . Penicillins Other (See Comments)    Patient not sure of the reaction. Has patient had a PCN reaction  causing immediate rash, facial/tongue/throat swelling, SOB or lightheadedness with hypotension:  patient not sure of reaction. Has patient had a PCN reaction causing severe rash involving mucus membranes or skin necrosis:  patient not sure of reaction. Has patient had a PCN reaction that required hospitalization  patient not sure of reaction Has patient had a PCN reaction occurring with    REVIEW OF SYSTEMS:  CONSTITUTIONAL: No fever, positive for fatigue. Positive chills. EYES: No blurred or double vision. Wears glasses. EARS, NOSE, AND THROAT: No tinnitus or ear pain. No sore throat. Wears hearing aids. Positive for runny nose. RESPIRATORY: Positive for cough with whitish phlegm. Positive for shortness of breath and wheezing. No hemoptysis.  CARDIOVASCULAR: No chest pain, orthopnea, edema.  GASTROINTESTINAL: No nausea, vomiting, diarrhea or abdominal pain. No blood in bowel movements GENITOURINARY: Occasional dysuria, no hematuria.  ENDOCRINE: No polyuria, nocturia,  HEMATOLOGY: No anemia, easy bruising or bleeding SKIN: No rash or lesion. MUSCULOSKELETAL: No joint pain or arthritis.   NEUROLOGIC: No tingling, numbness, weakness. History of passing out PSYCHIATRY: Positive for anxiety. No depression.   MEDICATIONS AT HOME:   Prior to Admission medications   Medication Sig Start Date  End Date Taking? Authorizing Provider  acetaminophen (TYLENOL) 500 MG tablet Take 500 mg by mouth as needed for pain.    Historical Provider, MD  amLODipine (NORVASC) 5 MG tablet Take 1 tablet (5 mg total) by mouth daily. 09/03/14   Antonieta Iba, MD  aspirin 81 MG tablet Take 81 mg by mouth daily.    Historical Provider, MD  Cholecalciferol (VITAMIN D-3 PO) Take by mouth.    Historical Provider, MD  furosemide (LASIX) 20 MG tablet Take 1 tablet (20 mg total) by mouth daily as needed. 09/03/14   Antonieta Iba, MD  lisinopril (PRINIVIL,ZESTRIL) 5 MG tablet Take 5 mg by mouth daily.    Historical  Provider, MD  metoprolol tartrate (LOPRESSOR) 25 MG tablet Take 25 mg by mouth 2 (two) times daily.    Historical Provider, MD  nitroGLYCERIN (NITROSTAT) 0.4 MG SL tablet Place 0.4 mg under the tongue every 5 (five) minutes as needed for chest pain.    Historical Provider, MD  pravastatin (PRAVACHOL) 80 MG tablet Take 80 mg by mouth daily.    Historical Provider, MD    Spiriva, Flomax, mupirocin ointment, ferrous sulfate,  VITAL SIGNS:  Blood pressure 140/115, pulse 82, temperature 98.2 F (36.8 C), temperature source Oral, resp. rate 11, height  (1.676 m), weight 65.772 kg (145 lb), SpO2 96 %.  PHYSICAL EXAMINATION:  GENERAL:  81 y.o.-year-old patient lying in the bed with no acute distress.  EYES: Pupils equal, round, reactive to light and accommodation. No scleral icterus. Extraocular muscles intact.  HEENT: Head atraumatic, normocephalic. Oropharynx and nasopharynx clear.  NECK:  Supple, no jugular venous distention. No thyroid enlargement, no tenderness.  LUNGS: Decreased breath sounds bilaterally, positive wheezing throughout entire lung field, no rales,rhonchi or crepitation. No use of accessory muscles of respiration.  CARDIOVASCULAR: S1, S2 normal. No murmurs, rubs, or gallops.  ABDOMEN: Soft, nontender, nondistended. Bowel sounds present. No organomegaly or mass.  EXTREMITIES: Trace edema, no cyanosis, or clubbing.  NEUROLOGIC: Cranial nerves II through XII are intact. Muscle strength 5/5 in all extremities. Sensation intact. Gait not checked.  PSYCHIATRIC: The patient is alert and oriented x 3.  SKIN: No rash, lesion, or ulcer.   LABORATORY PANEL:   CBC  Recent Labs Lab 01/11/15 0923  WBC 8.3  HGB 14.5  HCT 43.5  PLT 146*   ------------------------------------------------------------------------------------------------------------------  Chemistries   Recent Labs Lab 01/11/15 0923  NA 136  K 4.2  CL 104  CO2 27  GLUCOSE 109*  BUN 21*  CREATININE 1.27*   CALCIUM 9.5   ------------------------------------------------------------------------------------------------------------------  Cardiac Enzymes  Recent Labs Lab 01/11/15 0923  TROPONINI <0.03   ------------------------------------------------------------------------------------------------------------------  RADIOLOGY:  Dg Chest 2 View  01/11/2015   CLINICAL DATA:  Four month history of productive cough and congestion  EXAM: CHEST  2 VIEW  COMPARISON:  December 01, 2014  FINDINGS: Lungs remain somewhat hyperexpanded. There is mild bibasilar scarring. There is no edema or consolidation. Heart size and pulmonary vascularity are within normal limits. There is atherosclerotic change in the aorta. No apparent adenopathy. There are no appreciable bone lesions.  IMPRESSION: Lungs hyperexpanded without frank edema or consolidation. Mild scarring in the lung bases. Atherosclerotic calcification noted throughout aorta. Heart size normal.   Electronically Signed   By: Bretta Bang III M.D.   On: 01/11/2015 09:55    EKG:   Normal sinus rhythm 89 bpm, left atrial enlargement, right axis deviation  IMPRESSION AND PLAN:   1. COPD exacerbation, acute  respiratory failure with hypoxia. ER physician documented pulse ox dropped down to 89% with ambulation. We'll continue steroids orally on a daily basis nebulizer treatments and add budesonide nebulizers. I will add Levaquin by mouth. Check a pulse ox on room air in a.m. after ambulation 2. Essential hypertension- continue usual medications. 3. BPH- continue Flomax. 4. Hyperlipidemia unspecified- continue pravastatin. 5. Chronic kidney disease stage III 6. Thrombocytopenia- chronic in nature follow-up as outpatient.  All the records are reviewed and case discussed with ED provider. Management plans discussed with the patient, family and they are in agreement.  CODE STATUS: Full code  TOTAL TIME TAKING CARE OF THIS PATIENT: 55 minutes.     Alford Highland M.D on 01/11/2015 at 2:04 PM  Between 7am to 6pm - Pager - [phone]  After 6pm call admission pager [phone]  [facility]  Office  [phone]  CC: Primary care physician; [name], MD

## 2015-01-11 NOTE — Progress Notes (Signed)
Patients son stated that patient was prescribed a cough tablet that he thought might be tessalon perles and that patient refused to take them when he was at home.  Patient is not actively coughing here.

## 2015-01-11 NOTE — ED Notes (Signed)
Per family he has been congested lately  And having some diff breathing. Min relief with inhaler

## 2015-01-12 DIAGNOSIS — J9601 Acute respiratory failure with hypoxia: Secondary | ICD-10-CM | POA: Diagnosis not present

## 2015-01-12 LAB — CBC
HCT: 39.2 % — ABNORMAL LOW (ref 40.0–52.0)
Hemoglobin: 13.4 g/dL (ref 13.0–18.0)
MCH: 33.9 pg (ref 26.0–34.0)
MCHC: 34.1 g/dL (ref 32.0–36.0)
MCV: 99.6 fL (ref 80.0–100.0)
PLATELETS: 144 10*3/uL — AB (ref 150–440)
RBC: 3.94 MIL/uL — AB (ref 4.40–5.90)
RDW: 14 % (ref 11.5–14.5)
WBC: 6.4 10*3/uL (ref 3.8–10.6)

## 2015-01-12 LAB — BASIC METABOLIC PANEL
Anion gap: 5 (ref 5–15)
BUN: 25 mg/dL — ABNORMAL HIGH (ref 6–20)
CALCIUM: 9 mg/dL (ref 8.9–10.3)
CO2: 25 mmol/L (ref 22–32)
CREATININE: 1.48 mg/dL — AB (ref 0.61–1.24)
Chloride: 105 mmol/L (ref 101–111)
GFR, EST AFRICAN AMERICAN: 43 mL/min — AB (ref >60)
GFR, EST NON AFRICAN AMERICAN: 37 mL/min — AB (ref >60)
GLUCOSE: 134 mg/dL — AB (ref 65–99)
Potassium: 5.1 mmol/L (ref 3.5–5.1)
Sodium: 135 mmol/L (ref 135–145)

## 2015-01-12 MED ORDER — PREDNISONE 10 MG (21) PO TBPK: 10.0000 mg | ORAL_TABLET | Freq: Every day | ORAL | Status: DC

## 2015-01-12 MED ORDER — TIOTROPIUM BROMIDE MONOHYDRATE 18 MCG IN CAPS: 18.0000 ug | ORAL_CAPSULE | Freq: Every day | RESPIRATORY_TRACT | Status: DC

## 2015-01-12 MED ORDER — BENZONATATE 100 MG PO CAPS: 100.0000 mg | ORAL_CAPSULE | Freq: Three times a day (TID) | ORAL | Status: DC | PRN

## 2015-01-12 MED ORDER — LEVOFLOXACIN 250 MG PO TABS: 250.0000 mg | ORAL_TABLET | Freq: Every day | ORAL | Status: DC

## 2015-01-12 NOTE — Progress Notes (Signed)
Patient discharged home, instructions and prescription given to patient. Verbalized understanding. IV removed. Daughter providing transportation home.

## 2015-01-12 NOTE — Care Management Note (Signed)
Case Management Note  Patient Details  Name: Daniel Dickson MRN: [id number] Date of Birth: [birth year]  Subjective/Objective:     Discussed home health with daughter Amie Critchley [phone]. A referral was called to Feliberto Gottron at [facility] requesting RN services.                Action/Plan:   Expected Discharge Date:                  Expected Discharge Plan:     In-House Referral:     Discharge planning Services     Post Acute Care Choice:    Choice offered to:     DME Arranged:    DME Agency:     HH Arranged:    HH Agency:     Status of Service:     Medicare Important Message Given:  Yes-second notification given Date Medicare IM Given:    Medicare IM give by:    Date Additional Medicare IM Given:    Additional Medicare Important Message give by:     If discussed at Long Length of Stay Meetings, dates discussed:    Additional Comments:  Clarisse Rodriges A, RN 01/12/2015, 10:37 AM

## 2015-01-12 NOTE — Discharge Summary (Signed)
[facility] [place] at [facility]   PATIENT NAME: Daniel Dickson    MR#:  [id number]  DATE OF BIRTH:  February 07, 1913  DATE OF ADMISSION:  01/11/2015 ADMITTING PHYSICIAN: Alford Highland, MD  DATE OF DISCHARGE: 01/12/2015 10:58 AM  PRIMARY CARE PHYSICIAN: BLISS, Doreene Nest, MD    ADMISSION DIAGNOSIS:  Hypoxia [R09.02] COPD exacerbation (HCC) [J44.1]  DISCHARGE DIAGNOSIS:  Active Problems:   COPD exacerbation (HCC)   SECONDARY DIAGNOSIS:   Past Medical History  Diagnosis Date  . Hypertension     Hx  . PTSD (post-traumatic stress disorder)     with headaches  . Mixed hyperlipidemia   . MI (myocardial infarction) (HCC)   . Coronary artery disease   . COPD (chronic obstructive pulmonary disease) (HCC)   . Anxiety     HOSPITAL COURSE:   81 year old man with a history of COPD and essential hypertension who presented with shortness of breath for the past 2 days along with wheezing. For further details please refer the H&P.  1. Acute hypoxic respiratory failure: This is secondary to COPD exacerbation. Patient is now off of oxygen and doing well. Physical examination on discharge shows normal lung sounds.  2. Acute COPD exacerbation: Patient was placed on steroids, nebulizers and inhalers. Patient is doing well from COPD.  3. Essential hypertension: Patient continue outpatient medications.  40. BPH: Continue Flomax  5. Hyperlipidemia: Continue pravastatin  6. Chronic kidney disease stage III: Creatinine remained stable  7., Thrombocytopenia: This is chronic in nature and follow-up as outpatient  DISCHARGE CONDITIONS AND DIET:  Patient's being discharged with home health in stable condition on regular diet  CONSULTS OBTAINED:     DRUG ALLERGIES:   Allergies  Allergen Reactions  . Cortisone Other (See Comments)    Unknown reaction  . Dye Fdc Red [Red Dye] Other (See Comments)    Unknown reaction.  . Peanut Oil     Other reaction(s): Unknown   . Peanut-Containing Drug Products     Other reaction(s): Unknown  . Penicillins Other (See Comments)    Patient not sure of the reaction. Has patient had a PCN reaction causing immediate rash, facial/tongue/throat swelling, SOB or lightheadedness with hypotension: No patient not sure of reaction. Has patient had a PCN reaction causing severe rash involving mucus membranes or skin necrosis: No patient not sure of reaction. Has patient had a PCN reaction that required hospitalization No patient not sure of reaction Has patient had a PCN reaction occurring with    DISCHARGE MEDICATIONS:   Discharge Medication List as of 01/12/2015 10:03 AM    START taking these medications   Details  benzonatate (TESSALON) 100 MG capsule Take 1 capsule (100 mg total) by mouth 3 (three) times daily as needed for cough., Starting 01/12/2015, Until Discontinued, Normal    levofloxacin (LEVAQUIN) 250 MG tablet Take 1 tablet (250 mg total) by mouth daily., Starting 01/12/2015, Until Discontinued, Normal    predniSONE (STERAPRED UNI-PAK 21 TAB) 10 MG (21) TBPK tablet Take 1 tablet (10 mg total) by mouth daily., Starting 01/12/2015, Until Discontinued, Normal      CONTINUE these medications which have CHANGED   Details  tiotropium (SPIRIVA) 18 MCG inhalation capsule Place 1 capsule (18 mcg total) into inhaler and inhale daily., Starting 01/12/2015, Until Tue 01/12/16, Print      CONTINUE these medications which have NOT CHANGED   Details  acetaminophen (TYLENOL) 500 MG tablet Take 500 mg by mouth daily as needed for mild pain  or moderate pain. , Until Discontinued, Historical Med    albuterol (PROAIR HFA) 108 (90 BASE) MCG/ACT inhaler Inhale 2 puffs into the lungs every 6 (six) hours as needed. For wheezing., Until Discontinued, Historical Med    albuterol (PROVENTIL) (2.5 MG/3ML) 0.083% nebulizer solution Inhale 3 mLs into the lungs every 6 (six) hours as needed. For wheezing., Until Discontinued, Historical Med     amLODipine (NORVASC) 5 MG tablet Take 1 tablet (5 mg total) by mouth daily., Starting 09/03/2014, Until Discontinued, Normal    aspirin EC 81 MG tablet Take 81 mg by mouth daily., Until Discontinued, Historical Med    Cholecalciferol (VITAMIN D3) 1000 UNITS CAPS Take 1,000 Units by mouth daily., Until Discontinued, Historical Med    ferrous sulfate 325 (65 FE) MG EC tablet Take 325 mg by mouth 2 (two) times daily., Until Discontinued, Historical Med    lisinopril (PRINIVIL,ZESTRIL) 5 MG tablet Take 5 mg by mouth daily., Until Discontinued, Historical Med    loratadine (CLARITIN) 10 MG tablet Take 10 mg by mouth daily., Until Discontinued, Historical Med    metoprolol tartrate (LOPRESSOR) 25 MG tablet Take 25 mg by mouth 2 (two) times daily., Until Discontinued, Historical Med    nitroGLYCERIN (NITROSTAT) 0.4 MG SL tablet Place 0.4 mg under the tongue every 5 (five) minutes as needed for chest pain., Until Discontinued, Historical Med    pravastatin (PRAVACHOL) 80 MG tablet Take 80 mg by mouth daily., Until Discontinued, Historical Med    tamsulosin (FLOMAX) 0.4 MG CAPS capsule Take 0.4 mg by mouth daily. Take 30 minutes after same meal each day., Until Discontinued, Historical Med      STOP taking these medications     aspirin 81 MG tablet               Today   CHIEF COMPLAINT:  Patient is doing well this morning. Patient denies chest pain or shortness of breath   VITAL SIGNS:  Blood pressure 124/64, pulse 91, temperature 98.4 F (36.9 C), temperature source Oral, resp. rate 17, height  (1.676 m), weight 65.772 kg (145 lb), SpO2 93 %.   REVIEW OF SYSTEMS:  Review of Systems  Constitutional: Negative for fever, chills and malaise/fatigue.  HENT: Negative for sore throat.   Eyes: Negative for blurred vision.  Respiratory: Negative for cough, hemoptysis, shortness of breath and wheezing.   Cardiovascular: Negative for chest pain, palpitations and leg  swelling.  Gastrointestinal: Negative for nausea, vomiting, abdominal pain, diarrhea and blood in stool.  Genitourinary: Negative for dysuria.  Musculoskeletal: Negative for back pain.  Neurological: Negative for dizziness, tremors and headaches.  Endo/Heme/Allergies: Does not bruise/bleed easily.     PHYSICAL EXAMINATION:  GENERAL:  81 y.o.-year-old patient lying in the bed with no acute distress.  NECK:  Supple, no jugular venous distention. No thyroid enlargement, no tenderness.  LUNGS: Normal breath sounds bilaterally, no wheezing, rales,rhonchi  No use of accessory muscles of respiration.  CARDIOVASCULAR: S1, S2 normal. No murmurs, rubs, or gallops.  ABDOMEN: Soft, non-tender, non-distended. Bowel sounds present. No organomegaly or mass.  EXTREMITIES: No pedal edema, cyanosis, or clubbing.  PSYCHIATRIC: The patient is alert and oriented x 3.  SKIN: No obvious rash, lesion, or ulcer.   DATA REVIEW:   CBC  Recent Labs Lab 01/12/15 0351  WBC 6.4  HGB 13.4  HCT 39.2*  PLT 144*    Chemistries   Recent Labs Lab 01/12/15 0351  NA 135  K 5.1  CL 105  CO2 25  GLUCOSE 134*  BUN 25*  CREATININE 1.48*  CALCIUM 9.0    Cardiac Enzymes  Recent Labs Lab 01/11/15 0923  TROPONINI <0.03    Microbiology Results  @  RADIOLOGY:  Dg Chest 2 View  01/11/2015   CLINICAL DATA:  Four month history of productive cough and congestion  EXAM: CHEST  2 VIEW  COMPARISON:  December 01, 2014  FINDINGS: Lungs remain somewhat hyperexpanded. There is mild bibasilar scarring. There is no edema or consolidation. Heart size and pulmonary vascularity are within normal limits. There is atherosclerotic change in the aorta. No apparent adenopathy. There are no appreciable bone lesions.  IMPRESSION: Lungs hyperexpanded without frank edema or consolidation. Mild scarring in the lung bases. Atherosclerotic calcification noted throughout aorta. Heart size normal.   Electronically Signed    By: Bretta Bang III M.D.   On: 01/11/2015 09:55      Management plans discussed with the patient and he is in agreement. Stable for discharge home with home health care  Patient should follow up with PCP in one week  CODE STATUS:     Code Status Orders        Start     Ordered   01/11/15 1355  Full code   Continuous     01/11/15 1354      TOTAL TIME TAKING CARE OF THIS PATIENT: 35 minutes.    Keldric Poyer M.D on 01/12/2015 at 11:36 AM  Between 7am to 6pm - Pager - [phone] After 6pm go to [email or web address] - password [email or web address] [email or web address]  [place] [facility]  Office  [phone]  CC: Primary care physician; [name], MD

## 2015-01-12 NOTE — Care Management Note (Addendum)
Case Management Note  Patient Details  Name: Daniel Dickson MRN: [id number] Date of Birth: 1912/07/22  Subjective/Objective:    81yo Daniel Dickson was admitted 01/11/15 with a COPD exacerbation. He resides at home with his handicapped son who he takes care of, and his daughter lives next door. He reports that he has every piece of home assistance equipment imaginable, cane, walker, bedside toilet, wheelchair, raised toilet seat. No home oxygen. Daughter provides transportation. No home health services. Daughter cooks and assists with any needed household chores. Is being discharged home today with home health RN. Case manager will discuss any further home needs with Daniel Whites daughter today when she arrives to transport him home. All prescriptions were sent to [facility] per daughter [name] instructions.              Action/Plan:   Expected Discharge Date:                  Expected Discharge Plan:     In-House Referral:     Discharge planning Services     Post Acute Care Choice:    Choice offered to:     DME Arranged:    DME Agency:     HH Arranged:    HH Agency:     Status of Service:     Medicare Important Message Given:  Yes-second notification given Date Medicare IM Given:    Medicare IM give by:    Date Additional Medicare IM Given:    Additional Medicare Important Message give by:     If discussed at Long Length of Stay Meetings, dates discussed:    Additional Comments:  [name] A, RN 01/12/2015, 10:08 AM

## 2015-04-16 ENCOUNTER — Ambulatory Visit (INDEPENDENT_AMBULATORY_CARE_PROVIDER_SITE_OTHER): Payer: Medicare PPO | Admitting: Nurse Practitioner

## 2015-04-16 ENCOUNTER — Encounter: Payer: Self-pay | Admitting: Nurse Practitioner

## 2015-04-16 VITALS — BP 140/80 | HR 63 | Ht 66.0 in | Wt 146.5 lb

## 2015-04-16 DIAGNOSIS — E782 Mixed hyperlipidemia: Secondary | ICD-10-CM | POA: Insufficient documentation

## 2015-04-16 DIAGNOSIS — I2510 Atherosclerotic heart disease of native coronary artery without angina pectoris: Secondary | ICD-10-CM

## 2015-04-16 DIAGNOSIS — I10 Essential (primary) hypertension: Secondary | ICD-10-CM | POA: Diagnosis not present

## 2015-04-16 DIAGNOSIS — I119 Hypertensive heart disease without heart failure: Secondary | ICD-10-CM | POA: Insufficient documentation

## 2015-04-16 DIAGNOSIS — R000 Tachycardia, unspecified: Secondary | ICD-10-CM | POA: Diagnosis not present

## 2015-04-16 DIAGNOSIS — J449 Chronic obstructive pulmonary disease, unspecified: Secondary | ICD-10-CM | POA: Insufficient documentation

## 2015-04-16 NOTE — Progress Notes (Signed)
Office Visit    Patient Name: Daniel Dickson Date of Encounter: 04/16/2015  Primary Care Provider:  [name], MD Primary Cardiologist:  Concha Se, MD   Chief Complaint    82 year old male with a history of CAD who presents for follow-up.  Past Medical History    Past Medical History  Diagnosis Date  . Hypertensive heart disease   . PTSD (post-traumatic stress disorder)     with headaches  . Mixed hyperlipidemia   . Coronary artery disease     a. 2012 s/p MI/cardiac arrest--> PCI mRCA.  Marland Kitchen COPD (chronic obstructive pulmonary disease) (HCC)   . Anxiety    Past Surgical History  Procedure Laterality Date  . Hemorrhoid surgery    . Cholecystectomy    . Cataract extraction    . Vascular surgery      stent R femoral artery  . Coronary angioplasty      with stent; [facility]  . Hip surgery    . Hand surgery      Allergies  Allergies  Allergen Reactions  . Cortisone Other (See Comments)    Unknown reaction  . Dye Fdc Red [Red Dye] Other (See Comments)    Unknown reaction.  . Peanut Oil     Other reaction(s): Unknown  . Peanut-Containing Drug Products     Other reaction(s): Unknown  . Penicillins Other (See Comments)        History of Present Illness    82 year old male with the above complex past medical history. He has a history coronary artery disease status post cardiac arrest and myocardial infarction in 2012 and is status post stenting of the mid right coronary artery. He was last seen in clinic in May 2016, at which time he was having some lower extremity edema as well as lightheadedness. His amlodipine dose was reduced. Since then, he reports doing remarkably well. He remains relatively active though does require a cane to get around. He has not been having any chest pain or significant dyspnea on exertion. He was admitted to [facility] in October for COPD flare but otherwise has been doing well. He denies PND, orthopnea, dizziness, syncope, edema, or  early satiety.  Home Medications    Prior to Admission medications   Medication Sig Start Date End Date Taking? Authorizing Provider  acetaminophen (TYLENOL) 500 MG tablet Take 500 mg by mouth daily as needed for mild pain or moderate pain.    Yes Historical Provider, MD  albuterol (PROAIR HFA) 108 (90 BASE) MCG/ACT inhaler Inhale 2 puffs into the lungs every 6 (six) hours as needed. For wheezing.   Yes Historical Provider, MD  albuterol (PROVENTIL) (2.5 MG/3ML) 0.083% nebulizer solution Inhale 3 mLs into the lungs every 6 (six) hours as needed. For wheezing.   Yes Historical Provider, MD  amLODipine (NORVASC) 5 MG tablet Take 1 tablet (5 mg total) by mouth daily. 09/03/14  Yes Antonieta Iba, MD  aspirin EC 81 MG tablet Take 81 mg by mouth daily.   Yes Historical Provider, MD  benzonatate (TESSALON) 100 MG capsule Take 1 capsule (100 mg total) by mouth 3 (three) times daily as needed for cough. 01/12/15  Yes Adrian Saran, MD  Cholecalciferol (VITAMIN D3) 1000 UNITS CAPS Take 1,000 Units by mouth daily.   Yes Historical Provider, MD  ferrous sulfate 325 (65 FE) MG EC tablet Take 325 mg by mouth 2 (two) times daily.   Yes Historical Provider, MD  levofloxacin (LEVAQUIN) 250 MG tablet  Take 1 tablet (250 mg total) by mouth daily. 01/12/15  Yes Sital Mody, MD  lisinopril (PRINIVIL,ZESTRIL) 5 MG tablet Take 5 mg by mouth daily.   Yes Historical Provider, MD  loratadine (CLARITIN) 10 MG tablet Take 10 mg by mouth daily.   Yes Historical Provider, MD  metoprolol tartrate (LOPRESSOR) 25 MG tablet Take 25 mg by mouth 2 (two) times daily.   Yes Historical Provider, MD  nitroGLYCERIN (NITROSTAT) 0.4 MG SL tablet Place 0.4 mg under the tongue every 5 (five) minutes as needed for chest pain.   Yes Historical Provider, MD  pravastatin (PRAVACHOL) 80 MG tablet Take 80 mg by mouth daily.   Yes Historical Provider, MD  predniSONE (STERAPRED UNI-PAK 21 TAB) 10 MG (21) TBPK tablet Take 1 tablet (10 mg total) by mouth  daily. 01/12/15  Yes Adrian SaranSital Mody, MD  tamsulosin (FLOMAX) 0.4 MG CAPS capsule Take 0.4 mg by mouth daily. Take 30 minutes after same meal each day.   Yes Historical Provider, MD  tiotropium (SPIRIVA) 18 MCG inhalation capsule Place 1 capsule (18 mcg total) into inhaler and inhale daily. 01/12/15 01/12/16 Yes Adrian SaranSital Mody, MD    Review of Systems    As above, doing well. He was admitted for COPD flare in October but this seems to be under control. He has not been having chest pain, dyspnea, PND, orthopnea, dizziness, syncope, edema, palpitations, or early satiety.  All other systems reviewed and are otherwise negative except as noted above.  Physical Exam    VS:  BP 140/80 mmHg  Pulse 63  Ht 5\' 6"  (1.676 m)  Wt 146 lb 8 oz (66.452 kg)  BMI 23.66 kg/m2 , BMI Body mass index is 23.66 kg/(m^2). GEN: Well nourished, well developed, in no acute distress. HEENT: normal. Neck: Supple, no JVD, carotid bruits, or masses. Cardiac: RRR, no murmurs, rubs, or gallops. No clubbing, cyanosis, edema.  Radials/DP/PT 2+ and equal bilaterally.  Respiratory:  Respirations regular and unlabored, clear to auscultation bilaterally. GI: Soft, nontender, nondistended, BS + x 4. MS: no deformity or atrophy. Skin: warm and dry, no rash. Neuro:  Strength and sensation are intact. Psych: Normal affect.  Accessory Clinical Findings    ECG - regular sinus rhythm, left axis deviation, no acute ST or T changes.  Assessment & Plan    1.  Coronary artery disease: Status post prior MI and stenting of the right coronary artery. He is doing well from the standpoint without chest pain or dyspnea. He remains on aspirin, beta blocker, ACE inhibitor, and statin therapy.  2. Hypertensive heart disease: Blood pressures are variable at home and he follows these diligently. For the most part he is somewhat between 110 and 130. He is 140/80 today. He has not had any significant lightheadedness since dropping the amlodipine dose to 5  mg daily. He has also noticed improved lower extremity edema and has not had to take when necessary Lasix. Continue current regimen including amlodipine, ACE inhibitor, and beta blocker.  3. Hyperlipidemia: Continue statin therapy. Lipids are followed by his PCP.  4. Disposition: Follow-up with Dr. Mariah MillingGollan in 6 months or sooner if necessary.  Nicolasa Duckinghristopher Berge, NP 04/16/2015, 12:51 PM

## 2015-04-16 NOTE — Patient Instructions (Signed)
Medication Instructions:  Your physician recommends that you continue on your current medications as directed. Please refer to the Current Medication list given to you today.   Labwork: none  Testing/Procedures: None  Follow-Up: Your physician wants you to follow-up in: six months with Dr. Gollan.  You will receive a reminder letter in the mail two months in advance. If you don't receive a letter, please call our office to schedule the follow-up appointment.   Any Other Special Instructions Will Be Listed Below (If Applicable).     If you need a refill on your cardiac medications before your next appointment, please call your pharmacy.   

## 2015-06-06 ENCOUNTER — Inpatient Hospital Stay
Admission: EM | Admit: 2015-06-06 | Discharge: 2015-06-10 | DRG: 191 | Disposition: A | Discharge status: Home / Self Care | Payer: Medicare PPO | Attending: Internal Medicine | Admitting: Internal Medicine

## 2015-06-06 ENCOUNTER — Encounter: Payer: Self-pay | Admitting: Emergency Medicine

## 2015-06-06 ENCOUNTER — Emergency Department: Payer: Medicare PPO

## 2015-06-06 DIAGNOSIS — W19XXXA Unspecified fall, initial encounter: Secondary | ICD-10-CM | POA: Diagnosis present

## 2015-06-06 DIAGNOSIS — E871 Hypo-osmolality and hyponatremia: Secondary | ICD-10-CM | POA: Diagnosis present

## 2015-06-06 DIAGNOSIS — J449 Chronic obstructive pulmonary disease, unspecified: Secondary | ICD-10-CM | POA: Diagnosis not present

## 2015-06-06 DIAGNOSIS — N183 Chronic kidney disease, stage 3 (moderate): Secondary | ICD-10-CM | POA: Diagnosis present

## 2015-06-06 DIAGNOSIS — Y92009 Unspecified place in unspecified non-institutional (private) residence as the place of occurrence of the external cause: Secondary | ICD-10-CM | POA: Diagnosis not present

## 2015-06-06 DIAGNOSIS — I130 Hypertensive heart and chronic kidney disease with heart failure and stage 1 through stage 4 chronic kidney disease, or unspecified chronic kidney disease: Secondary | ICD-10-CM | POA: Diagnosis present

## 2015-06-06 DIAGNOSIS — I119 Hypertensive heart disease without heart failure: Secondary | ICD-10-CM | POA: Diagnosis present

## 2015-06-06 DIAGNOSIS — J440 Chronic obstructive pulmonary disease with acute lower respiratory infection: Secondary | ICD-10-CM | POA: Diagnosis not present

## 2015-06-06 DIAGNOSIS — Z8674 Personal history of sudden cardiac arrest: Secondary | ICD-10-CM | POA: Diagnosis not present

## 2015-06-06 DIAGNOSIS — Z7982 Long term (current) use of aspirin: Secondary | ICD-10-CM | POA: Diagnosis not present

## 2015-06-06 DIAGNOSIS — Z87891 Personal history of nicotine dependence: Secondary | ICD-10-CM

## 2015-06-06 DIAGNOSIS — E785 Hyperlipidemia, unspecified: Secondary | ICD-10-CM | POA: Diagnosis present

## 2015-06-06 DIAGNOSIS — I10 Essential (primary) hypertension: Secondary | ICD-10-CM | POA: Diagnosis present

## 2015-06-06 DIAGNOSIS — J209 Acute bronchitis, unspecified: Secondary | ICD-10-CM | POA: Diagnosis present

## 2015-06-06 DIAGNOSIS — I2510 Atherosclerotic heart disease of native coronary artery without angina pectoris: Secondary | ICD-10-CM | POA: Diagnosis present

## 2015-06-06 DIAGNOSIS — J441 Chronic obstructive pulmonary disease with (acute) exacerbation: Secondary | ICD-10-CM | POA: Diagnosis present

## 2015-06-06 LAB — BASIC METABOLIC PANEL
Anion gap: 11 (ref 5–15)
BUN: 24 mg/dL — ABNORMAL HIGH (ref 6–20)
CALCIUM: 8.7 mg/dL — AB (ref 8.9–10.3)
CO2: 25 mmol/L (ref 22–32)
CREATININE: 1.48 mg/dL — AB (ref 0.61–1.24)
Chloride: 87 mmol/L — ABNORMAL LOW (ref 101–111)
GFR calc non Af Amer: 37 mL/min — ABNORMAL LOW (ref >60)
GFR, EST AFRICAN AMERICAN: 43 mL/min — AB (ref >60)
Glucose, Bld: 146 mg/dL — ABNORMAL HIGH (ref 65–99)
Potassium: 3.8 mmol/L (ref 3.5–5.1)
SODIUM: 123 mmol/L — AB (ref 135–145)

## 2015-06-06 LAB — CBC
HCT: 41.8 % (ref 40.0–52.0)
Hemoglobin: 14.3 g/dL (ref 13.0–18.0)
MCH: 32.9 pg (ref 26.0–34.0)
MCHC: 34.3 g/dL (ref 32.0–36.0)
MCV: 96 fL (ref 80.0–100.0)
PLATELETS: 134 10*3/uL — AB (ref 150–440)
RBC: 4.35 MIL/uL — AB (ref 4.40–5.90)
RDW: 13.9 % (ref 11.5–14.5)
WBC: 8.7 10*3/uL (ref 3.8–10.6)

## 2015-06-06 LAB — TROPONIN I: TROPONIN I: 0.04 ng/mL — AB (ref <0.031)

## 2015-06-06 LAB — BRAIN NATRIURETIC PEPTIDE: B Natriuretic Peptide: 105 pg/mL — ABNORMAL HIGH (ref 0.0–100.0)

## 2015-06-06 MED ORDER — SODIUM CHLORIDE 0.9 % IV BOLUS (SEPSIS)
500.0000 mL | Freq: Once | INTRAVENOUS | Status: AC
  Administered 2015-06-06: 500 mL via INTRAVENOUS

## 2015-06-06 MED ORDER — IPRATROPIUM-ALBUTEROL 0.5-2.5 (3) MG/3ML IN SOLN
3.0000 mL | Freq: Once | RESPIRATORY_TRACT | Status: AC
  Administered 2015-06-06: 3 mL via RESPIRATORY_TRACT
  Filled 2015-06-06: qty 3

## 2015-06-06 MED ORDER — METHYLPREDNISOLONE SODIUM SUCC 125 MG IJ SOLR
125.0000 mg | Freq: Once | INTRAMUSCULAR | Status: AC
  Administered 2015-06-06: 125 mg via INTRAVENOUS
  Filled 2015-06-06: qty 2

## 2015-06-06 NOTE — ED Notes (Signed)
Patient transported to X-ray 

## 2015-06-06 NOTE — ED Provider Notes (Signed)
Time Seen: Approximately 2110  I have reviewed the triage notes  Chief Complaint: Shortness of Breath and Fall   History of Present Illness: Daniel Dickson is a 82 y.o. male who presents with acute onset of respiratory distress. Patient apparently fell at home per EMS that the patient himself denies any significant injury states most of his concerns are about his shortness of breath. Patient is not on chronic home O2 but does have nebulizer therapy at home. He states he's had a persistent dry nonproductive cough over the last 48 hours. He denies any hemoptysis. Denies any new leg pain or swelling though states he does have a history of peripheral edema. In functions very independently is awake alert and oriented 3. He denies any chest pain or head trauma. He denies any syncopal fall.*   Past Medical History  Diagnosis Date  . Hypertensive heart disease   . PTSD (post-traumatic stress disorder)     with headaches  . Mixed hyperlipidemia   . Coronary artery disease     a. 2012 s/p MI/cardiac arrest--> PCI mRCA.  Marland Kitchen COPD (chronic obstructive pulmonary disease) (HCC)   . Anxiety     Patient Active Problem List   Diagnosis Date Noted  . Hyponatremia 06/06/2015  . Coronary artery disease   . Hypertensive heart disease   . Mixed hyperlipidemia   . COPD (chronic obstructive pulmonary disease) (HCC)   . COPD exacerbation (HCC) 01/11/2015  . Fall 09/03/2014  . Hip fracture (HCC) 09/03/2014  . PTSD (post-traumatic stress disorder) 09/03/2014  . Bilateral leg edema 09/03/2014  . SOB (shortness of breath) 10/31/2012  . Essential hypertension 10/31/2012  . CAD (coronary artery disease) 10/31/2012  . Hyperlipidemia 10/31/2012  . Tachycardia 10/31/2012    Past Surgical History  Procedure Laterality Date  . Hemorrhoid surgery    . Cholecystectomy    . Cataract extraction    . Vascular surgery      stent R femoral artery  . Coronary angioplasty      with stent; [facility]  . Hip  surgery    . Hand surgery      Past Surgical History  Procedure Laterality Date  . Hemorrhoid surgery    . Cholecystectomy    . Cataract extraction    . Vascular surgery      stent R femoral artery  . Coronary angioplasty      with stent; [facility]  . Hip surgery    . Hand surgery      Current Outpatient Rx  Name  Route  Sig  Dispense  Refill  . acetaminophen (TYLENOL) 500 MG tablet   Oral   Take 500 mg by mouth daily as needed for mild pain or moderate pain.          Marland Kitchen albuterol (PROAIR HFA) 108 (90 BASE) MCG/ACT inhaler   Inhalation   Inhale 2 puffs into the lungs every 6 (six) hours as needed. For wheezing.         Marland Kitchen albuterol (PROVENTIL) (2.5 MG/3ML) 0.083% nebulizer solution   Inhalation   Inhale 3 mLs into the lungs every 6 (six) hours as needed. For wheezing.         Marland Kitchen amLODipine (NORVASC) 5 MG tablet   Oral   Take 1 tablet (5 mg total) by mouth daily.   90 tablet   4   . aspirin EC 81 MG tablet   Oral   Take 81 mg by mouth daily.         Marland Kitchen  Cholecalciferol (VITAMIN D3) 1000 UNITS CAPS   Oral   Take 1,000 Units by mouth daily.         . ferrous sulfate 325 (65 FE) MG EC tablet   Oral   Take 325 mg by mouth 2 (two) times daily.         . FUROSEMIDE PO   Oral   Take 1 tablet by mouth daily as needed (fluid).         Marland Kitchen lisinopril (PRINIVIL,ZESTRIL) 5 MG tablet   Oral   Take 5 mg by mouth daily.         Marland Kitchen loratadine (CLARITIN) 10 MG tablet   Oral   Take 10 mg by mouth daily.         . metoprolol tartrate (LOPRESSOR) 25 MG tablet   Oral   Take 25 mg by mouth 2 (two) times daily.         . nitroGLYCERIN (NITROSTAT) 0.4 MG SL tablet   Sublingual   Place 0.4 mg under the tongue every 5 (five) minutes as needed for chest pain.         . pravastatin (PRAVACHOL) 80 MG tablet   Oral   Take 80 mg by mouth daily.         . tamsulosin (FLOMAX) 0.4 MG CAPS capsule   Oral   Take 0.4 mg by mouth daily. Take 30 minutes after same meal  each day.         . tiotropium (SPIRIVA) 18 MCG inhalation capsule   Inhalation   Place 1 capsule (18 mcg total) into inhaler and inhale daily. Patient taking differently: Place 18 mcg into inhaler and inhale every evening.    30 capsule   12   . benzonatate (TESSALON) 100 MG capsule   Oral   Take 1 capsule (100 mg total) by mouth 3 (three) times daily as needed for cough. Patient not taking: Reported on 06/06/2015   20 capsule   0   . levofloxacin (LEVAQUIN) 250 MG tablet   Oral   Take 1 tablet (250 mg total) by mouth daily. Patient not taking: Reported on 06/06/2015   5 tablet   0   . predniSONE (STERAPRED UNI-PAK 21 TAB) 10 MG (21) TBPK tablet   Oral   Take 1 tablet (10 mg total) by mouth daily. Patient not taking: Reported on 06/06/2015   30 tablet   0     Allergies:  Cortisone; Dye fdc red; Peanut oil; Peanut-containing drug products; and Penicillins  Family History: Family History  Problem Relation Age of Onset  . COPD Father     Social History: Social History  Substance Use Topics  . Smoking status: Former Smoker -- 3.00 packs/day for 40 years    Types: Cigarettes  . Smokeless tobacco: None  . Alcohol Use: Yes     Comment: wine at night.      Review of Systems:   10 point review of systems was performed and was otherwise negative:  Constitutional: No fever Eyes: No visual disturbances ENT: No sore throat, ear pain Cardiac: No chest pain Respiratory: Patient's status and increasing audible wheezing with increased shortness of breath Abdomen: No abdominal pain, no vomiting, No diarrhea Endocrine: No weight loss, No night sweats Extremities: No peripheral edema, cyanosis Skin: No rashes, easy bruising Neurologic: No focal weakness, trouble with speech or swollowing Urologic: No dysuria, Hematuria, or urinary frequency   Physical Exam:  ED Triage Vitals  Enc Vitals Group  BP 06/06/15 2100 141/67 mmHg     Pulse Rate 06/06/15 2225 115      Resp 06/06/15 2100 20     Temp 06/06/15 2225 99.8 F (37.7 C)     Temp Source 06/06/15 2225 Oral     SpO2 06/06/15 2053 96 %     Weight --      Height --      Head Cir --      Peak Flow --      Pain Score 06/06/15 2057 0     Pain Loc --      Pain Edu? --      Excl. in GC? --     General: Awake , Alert , and Oriented times 3; GCS 15 tachypnea and speaks in interrupted sentences Head: Normal cephalic , atraumatic Eyes: Pupils equal , round, reactive to light Nose/Throat: No nasal drainage, patent upper airway without erythema or exudate.  Neck: Supple, Full range of motion, No anterior adenopathy or palpable thyroid masses Lungs: Patient has diffuse wheezing auscultated symmetrically in all lung fields Heart: Regular rate, regular rhythm without murmurs , gallops , or rubs Abdomen: Soft, non tender without rebound, guarding , or rigidity; bowel sounds positive and symmetric in all 4 quadrants. No organomegaly .        Extremities: 2 plus symmetric pulses. Patient says his legs wrapped and has history of peripheral edema  Neurologic:, Motor symmetric without deficits, sensory intact Skin: warm, dry, no rashes   Labs:   All laboratory work was reviewed including any pertinent negatives or positives listed below:  Labs Reviewed  BASIC METABOLIC PANEL - Abnormal; Notable for the following:    Sodium 123 (*)    Chloride 87 (*)    Glucose, Bld 146 (*)    BUN 24 (*)    Creatinine, Ser 1.48 (*)    Calcium 8.7 (*)    GFR calc non Af Amer 37 (*)    GFR calc Af Amer 43 (*)    All other components within normal limits  CBC - Abnormal; Notable for the following:    RBC 4.35 (*)    Platelets 134 (*)    All other components within normal limits  TROPONIN I - Abnormal; Notable for the following:    Troponin I 0.04 (*)    All other components within normal limits  BRAIN NATRIURETIC PEPTIDE - Abnormal; Notable for the following:    B Natriuretic Peptide 105.0 (*)    All other  components within normal limits   review of patient's laboratory work shows a low sodium at 123 some mild renal insufficiency  EKG:  ED ECG REPORT I, Jennye Moccasin, the attending physician, personally viewed and interpreted this ECG.  Date: 06/06/2015 EKG Time: *2058 Rate: 108 Rhythm: Sinus tachycardia QRS Axis: Left axis deviation with left anterior fascicular block Intervals: normal ST/T Wave abnormalities: normal Conduction Disturbances: none Narrative Interpretation: unremarkable *No acute ischemic changes Poor R-wave progression noticed in the septal leads Low voltage pattern consistent with pulmonary disease   Radiology:     CLINICAL DATA: 82 year old male with difficulty breathing for 1 month and shortness of breath. Wheezing. Fall tonight.  EXAM: CHEST 2 VIEW  COMPARISON: Radiographs 01/11/2015  FINDINGS: Stable hyperinflation. Stable bibasilar scarring. Cardiomediastinal contours are normal with atherosclerosis of the aortic arch. No pulmonary edema, focal consolidation, pleural effusion or pneumothorax. No acute osseous abnormalities seen.  IMPRESSION: Stable hyperinflation and bibasilar scarring. No superimposed acute process.  I personally  reviewed the radiologic studies    ED Course: Patient arrives in mild respiratory distress though his pulse ox at this time appears stable. He felt improved with placing the patient on supplemental oxygen he was given a DuoNeb had been an IV Solu-Medrol bolus. Patient was initiated on workup for dyspnea which included chest x-ray which did not show any evidence of pneumonia at bedrest suspect with his low-grade fever he has acute bronchitis. The patient most likely has an exacerbation of his COPD. He was also found to have hyponatremia though he has a normal mental status here in emergency department. Patient felt improved but I felt he required overnight observation and continuous nebulizer  therapy. Patient also  likely need his sodium rechecked and he was given IV normal saline bolus here in emergency department.    Assessment:  Acute exacerbation of chronic obstructive pulmonary disease Acute bronchitis Hyponatremia     Plan:  Inpatient management I spoke to the hospitalist team, further disposition and management depends upon her evaluation*            Jennye Moccasin, MD 06/06/15 2344

## 2015-06-06 NOTE — H&P (Signed)
[facility] - [place] at [facility]   PATIENT NAME: Daniel Dickson    MR#:  [id number]  DATE OF BIRTH:  08/17/1912  DATE OF ADMISSION:  06/06/2015  PRIMARY CARE PHYSICIAN: BLISS, Doreene Nest, MD   REQUESTING/REFERRING PHYSICIAN: Huel Cote, MD  CHIEF COMPLAINT:   Chief Complaint  Patient presents with  . Shortness of Breath  . Fall    HISTORY OF PRESENT ILLNESS:  Daniel Dickson  is a 82 y.o. male who presents with shortness of breath and a fall. Patient has COPD, and states that for the past 2 days he has had an increase in brownish sputum, and increasing wheezing, and increasing shortness of breath. He states that his shortness of breath is worse at night and in the early morning. After he fell in his bathroom when he was significantly short of breath he decided to come in to the ED for evaluation. Here he was found to be tachycardic and tachypneic with significant wheezing on exam. He was treated for COPD exacerbation in the ED, and hospitalists were called for admission. Of note, he was also found on labs to be hyponatremic.  PAST MEDICAL HISTORY:   Past Medical History  Diagnosis Date  . Hypertensive heart disease   . PTSD (post-traumatic stress disorder)     with headaches  . Mixed hyperlipidemia   . Coronary artery disease     a. 2012 s/p MI/cardiac arrest--> PCI mRCA.  Marland Kitchen COPD (chronic obstructive pulmonary disease) (HCC)   . Anxiety     PAST SURGICAL HISTORY:   Past Surgical History  Procedure Laterality Date  . Hemorrhoid surgery    . Cholecystectomy    . Cataract extraction    . Vascular surgery      stent R femoral artery  . Coronary angioplasty      with stent; [facility]  . Hip surgery    . Hand surgery      SOCIAL HISTORY:   Social History  Substance Use Topics  . Smoking status: Former Smoker -- 3.00 packs/day for 40 years    Types: Cigarettes  . Smokeless tobacco: Not on file  . Alcohol Use: Yes     Comment: wine at night.      FAMILY HISTORY:   Family History  Problem Relation Age of Onset  . COPD Father     DRUG ALLERGIES:   Allergies  Allergen Reactions  . Cortisone Other (See Comments)    Unknown reaction  . Dye Fdc Red [Red Dye] Other (See Comments)    Unknown reaction.  . Peanut Oil     Other reaction(s): Unknown  . Peanut-Containing Drug Products     Other reaction(s): Unknown  . Penicillins     patient not sure of reaction    MEDICATIONS AT HOME:   Prior to Admission medications   Medication Sig Start Date End Date Taking? Authorizing Provider  acetaminophen (TYLENOL) 500 MG tablet Take 500 mg by mouth daily as needed for mild pain or moderate pain.    Yes Historical Provider, MD  albuterol (PROAIR HFA) 108 (90 BASE) MCG/ACT inhaler Inhale 2 puffs into the lungs every 6 (six) hours as needed. For wheezing.   Yes Historical Provider, MD  albuterol (PROVENTIL) (2.5 MG/3ML) 0.083% nebulizer solution Inhale 3 mLs into the lungs every 6 (six) hours as needed. For wheezing.   Yes Historical Provider, MD  amLODipine (NORVASC) 5 MG tablet Take 1 tablet (5 mg total) by mouth daily. 09/03/14  Yes Marcial Pacas  Elmarie Mainland, MD  aspirin EC 81 MG tablet Take 81 mg by mouth daily.   Yes Historical Provider, MD  Cholecalciferol (VITAMIN D3) 1000 UNITS CAPS Take 1,000 Units by mouth daily.   Yes Historical Provider, MD  ferrous sulfate 325 (65 FE) MG EC tablet Take 325 mg by mouth 2 (two) times daily.   Yes Historical Provider, MD  FUROSEMIDE PO Take 1 tablet by mouth daily as needed (fluid).   Yes Historical Provider, MD  lisinopril (PRINIVIL,ZESTRIL) 5 MG tablet Take 5 mg by mouth daily.   Yes Historical Provider, MD  loratadine (CLARITIN) 10 MG tablet Take 10 mg by mouth daily.   Yes Historical Provider, MD  metoprolol tartrate (LOPRESSOR) 25 MG tablet Take 25 mg by mouth 2 (two) times daily.   Yes Historical Provider, MD  nitroGLYCERIN (NITROSTAT) 0.4 MG SL tablet Place 0.4 mg under the tongue every 5 (five)  minutes as needed for chest pain.   Yes Historical Provider, MD  pravastatin (PRAVACHOL) 80 MG tablet Take 80 mg by mouth daily.   Yes Historical Provider, MD  tamsulosin (FLOMAX) 0.4 MG CAPS capsule Take 0.4 mg by mouth daily. Take 30 minutes after same meal each day.   Yes Historical Provider, MD  tiotropium (SPIRIVA) 18 MCG inhalation capsule Place 1 capsule (18 mcg total) into inhaler and inhale daily. Patient taking differently: Place 18 mcg into inhaler and inhale every evening.  01/12/15 01/12/16 Yes Sital Mody, MD  benzonatate (TESSALON) 100 MG capsule Take 1 capsule (100 mg total) by mouth 3 (three) times daily as needed for cough. Patient not taking: Reported on 06/06/2015 01/12/15   Adrian Saran, MD  levofloxacin (LEVAQUIN) 250 MG tablet Take 1 tablet (250 mg total) by mouth daily. Patient not taking: Reported on 06/06/2015 01/12/15   Adrian Saran, MD  predniSONE (STERAPRED UNI-PAK 21 TAB) 10 MG (21) TBPK tablet Take 1 tablet (10 mg total) by mouth daily. Patient not taking: Reported on 06/06/2015 01/12/15   Adrian Saran, MD    REVIEW OF SYSTEMS:  Review of Systems  Constitutional: Negative for fever, chills, weight loss and malaise/fatigue.  HENT: Negative for ear pain, hearing loss and tinnitus.   Eyes: Negative for blurred vision, double vision, pain and redness.  Respiratory: Positive for cough, sputum production, shortness of breath and wheezing. Negative for hemoptysis.   Cardiovascular: Negative for chest pain, palpitations, orthopnea and leg swelling.  Gastrointestinal: Negative for nausea, vomiting, abdominal pain, diarrhea and constipation.  Genitourinary: Negative for dysuria, frequency and hematuria.  Musculoskeletal: Negative for back pain, joint pain and neck pain.  Skin:       No acne, rash, or lesions  Neurological: Negative for dizziness, tremors, focal weakness and weakness.  Endo/Heme/Allergies: Negative for polydipsia. Does not bruise/bleed easily.  Psychiatric/Behavioral:  Negative for depression. The patient is not nervous/anxious and does not have insomnia.      VITAL SIGNS:   Filed Vitals:   06/06/15 2225 06/06/15 2230 06/06/15 2300 06/06/15 2330  BP: 131/60 120/67 130/66 130/77  Pulse: 115 112 113 107  Temp: 99.8 F (37.7 C)     TempSrc: Oral     Resp: 20 22 24 22   SpO2: 96% 97% 94% 98%   Wt Readings from Last 3 Encounters:  04/16/15 66.452 kg (146 lb 8 oz)  01/11/15 65.772 kg (145 lb)  09/03/14 67.359 kg (148 lb 8 oz)    PHYSICAL EXAMINATION:  Physical Exam  Vitals reviewed. Constitutional: He is oriented to person, place, and  time. He appears well-developed and well-nourished. No distress.  HENT:  Head: Normocephalic and atraumatic.  Mouth/Throat: Oropharynx is clear and moist.  Eyes: Conjunctivae and EOM are normal. Pupils are equal, round, and reactive to light. No scleral icterus.  Neck: Normal range of motion. Neck supple. No JVD present. No thyromegaly present.  Cardiovascular: Regular rhythm and intact distal pulses.  Exam reveals no gallop and no friction rub.   No murmur heard. Tachycardic  Respiratory: Effort normal. No respiratory distress. He has wheezes. He has no rales.  GI: Soft. Bowel sounds are normal. He exhibits no distension. There is no tenderness.  Musculoskeletal: Normal range of motion. He exhibits no edema.  No arthritis, no gout  Lymphadenopathy:    He has no cervical adenopathy.  Neurological: He is alert and oriented to person, place, and time. No cranial nerve deficit.  No dysarthria, no aphasia  Skin: Skin is warm and dry. No rash noted. No erythema.  Psychiatric: He has a normal mood and affect. His behavior is normal. Judgment and thought content normal.    LABORATORY PANEL:   CBC  Recent Labs Lab 06/06/15 2106  WBC 8.7  HGB 14.3  HCT 41.8  PLT 134*   ------------------------------------------------------------------------------------------------------------------  Chemistries   Recent  Labs Lab 06/06/15 2106  NA 123*  K 3.8  CL 87*  CO2 25  GLUCOSE 146*  BUN 24*  CREATININE 1.48*  CALCIUM 8.7*   ------------------------------------------------------------------------------------------------------------------  Cardiac Enzymes  Recent Labs Lab 06/06/15 2106  TROPONINI 0.04*   ------------------------------------------------------------------------------------------------------------------  RADIOLOGY:  Dg Chest 2 View  06/06/2015  CLINICAL DATA:  82 year old male with difficulty breathing for 1 month and shortness of breath. Wheezing. Fall tonight. EXAM: CHEST  2 VIEW COMPARISON:  Radiographs 01/11/2015 FINDINGS: Stable hyperinflation. Stable bibasilar scarring. Cardiomediastinal contours are normal with atherosclerosis of the aortic arch. No pulmonary edema, focal consolidation, pleural effusion or pneumothorax. No acute osseous abnormalities seen. IMPRESSION: Stable hyperinflation and bibasilar scarring. No superimposed acute process. Electronically Signed   By: Rubye Oaks M.D.   On: 06/06/2015 21:39    EKG:   Orders placed or performed during the hospital encounter of 06/06/15  . EKG 12-Lead  . EKG 12-Lead  . ED EKG  . ED EKG    IMPRESSION AND PLAN:  Principal Problem:   COPD exacerbation (HCC) - IV steroids and DuoNeb's given the ED with some improvement in breathing. We will continue IV steroids, DuoNeb's, and add azithromycin. Continue home meds as well. Active Problems:   Hyponatremia - patient states that as the COPD flares up he loses his appetite. Suspect his hyponatremia is from poor by mouth intake we will treat this initially with hydration with normal saline and monitor closely for improvement. This may have contributed to his falls as well, though he does not endorse any overt neurological symptoms.   Essential hypertension - currently controlled, continue home meds   CAD (coronary artery disease) - continue home meds   Hyperlipidemia  - continue home meds   CKD Stage III - avoid nephrotoxins, patient seems to be at his baseline renal function, monitor.  All the records are reviewed and case discussed with ED provider. Management plans discussed with the patient and/or family.  DVT PROPHYLAXIS: SubQ heparin  GI PROPHYLAXIS: None  ADMISSION STATUS: Inpatient  CODE STATUS: Full Code Status History    Date Active Date Inactive Code Status Order ID Comments User Context   01/11/2015  1:54 PM 01/12/2015  1:58 PM Full Code [id number]  Alford Highland, MD ED    Advance Directive Documentation        Most Recent Value   Type of Advance Directive  Healthcare Power of Attorney, Living will   Pre-existing out of facility DNR order (yellow form or pink MOST form)     "MOST" Form in Place?        TOTAL TIME TAKING CARE OF THIS PATIENT: 45 minutes.    Abdulwahab Demelo FIELDING 06/06/2015, 11:46 PM  [facility]  Office  [phone]  CC: Primary care physician; [name], MD

## 2015-06-06 NOTE — ED Notes (Signed)
Per EMS, patient fell at home and denies hitting his head.  VS WNL during transport to [facility].  Pt is complaining of SOB and wheezes are evident.  Pt has skin tear that has been treated during transport.  Pt is AOx4.

## 2015-06-06 NOTE — ED Notes (Signed)
Patient returned from XR. 

## 2015-06-07 LAB — CBC
HEMATOCRIT: 39.7 % — AB (ref 40.0–52.0)
HEMOGLOBIN: 13.8 g/dL (ref 13.0–18.0)
MCH: 33.1 pg (ref 26.0–34.0)
MCHC: 34.8 g/dL (ref 32.0–36.0)
MCV: 95 fL (ref 80.0–100.0)
Platelets: 131 10*3/uL — ABNORMAL LOW (ref 150–440)
RBC: 4.17 MIL/uL — ABNORMAL LOW (ref 4.40–5.90)
RDW: 13.8 % (ref 11.5–14.5)
WBC: 9 10*3/uL (ref 3.8–10.6)

## 2015-06-07 LAB — TROPONIN I
TROPONIN I: 0.04 ng/mL — AB (ref <0.031)
TROPONIN I: 0.04 ng/mL — AB (ref <0.031)
Troponin I: 0.03 ng/mL (ref <0.031)

## 2015-06-07 LAB — BASIC METABOLIC PANEL
ANION GAP: 9 (ref 5–15)
BUN: 22 mg/dL — AB (ref 6–20)
CO2: 22 mmol/L (ref 22–32)
Calcium: 8 mg/dL — ABNORMAL LOW (ref 8.9–10.3)
Chloride: 92 mmol/L — ABNORMAL LOW (ref 101–111)
Creatinine, Ser: 1.21 mg/dL (ref 0.61–1.24)
GFR calc Af Amer: 54 mL/min — ABNORMAL LOW (ref >60)
GFR, EST NON AFRICAN AMERICAN: 47 mL/min — AB (ref >60)
GLUCOSE: 130 mg/dL — AB (ref 65–99)
POTASSIUM: 3.5 mmol/L (ref 3.5–5.1)
Sodium: 123 mmol/L — ABNORMAL LOW (ref 135–145)

## 2015-06-07 LAB — OSMOLALITY, URINE: Osmolality, Ur: 559 mOsm/kg (ref 300–900)

## 2015-06-07 LAB — SODIUM, URINE, RANDOM: SODIUM UR: 12 mmol/L

## 2015-06-07 MED ORDER — TIOTROPIUM BROMIDE MONOHYDRATE 18 MCG IN CAPS
18.0000 ug | ORAL_CAPSULE | Freq: Every day | RESPIRATORY_TRACT | Status: DC
  Administered 2015-06-07 – 2015-06-09 (×3): 18 ug via RESPIRATORY_TRACT
  Filled 2015-06-07: qty 5

## 2015-06-07 MED ORDER — METHYLPREDNISOLONE SODIUM SUCC 125 MG IJ SOLR
60.0000 mg | INTRAMUSCULAR | Status: DC
  Administered 2015-06-08 – 2015-06-10 (×3): 60 mg via INTRAVENOUS
  Filled 2015-06-07 (×4): qty 2

## 2015-06-07 MED ORDER — TAMSULOSIN HCL 0.4 MG PO CAPS
0.4000 mg | ORAL_CAPSULE | Freq: Every day | ORAL | Status: DC
  Administered 2015-06-07 – 2015-06-10 (×4): 0.4 mg via ORAL
  Filled 2015-06-07 (×4): qty 1

## 2015-06-07 MED ORDER — IPRATROPIUM-ALBUTEROL 0.5-2.5 (3) MG/3ML IN SOLN
3.0000 mL | RESPIRATORY_TRACT | Status: DC | PRN
  Administered 2015-06-07: 3 mL via RESPIRATORY_TRACT
  Filled 2015-06-07: qty 3

## 2015-06-07 MED ORDER — GUAIFENESIN-CODEINE 100-10 MG/5ML PO SOLN
10.0000 mL | ORAL | Status: DC | PRN
Start: 2015-06-07 — End: 2015-06-07

## 2015-06-07 MED ORDER — PRAVASTATIN SODIUM 20 MG PO TABS
80.0000 mg | ORAL_TABLET | Freq: Every day | ORAL | Status: DC
  Administered 2015-06-07 – 2015-06-10 (×4): 80 mg via ORAL
  Filled 2015-06-07 (×4): qty 4

## 2015-06-07 MED ORDER — METHYLPREDNISOLONE SODIUM SUCC 125 MG IJ SOLR
60.0000 mg | Freq: Four times a day (QID) | INTRAMUSCULAR | Status: DC
  Administered 2015-06-07: 60 mg via INTRAVENOUS
  Filled 2015-06-07: qty 2

## 2015-06-07 MED ORDER — AMLODIPINE BESYLATE 5 MG PO TABS
5.0000 mg | ORAL_TABLET | Freq: Every day | ORAL | Status: DC
  Administered 2015-06-07 – 2015-06-10 (×4): 5 mg via ORAL
  Filled 2015-06-07 (×4): qty 1

## 2015-06-07 MED ORDER — ACETAMINOPHEN 325 MG PO TABS
650.0000 mg | ORAL_TABLET | Freq: Four times a day (QID) | ORAL | Status: DC | PRN
  Administered 2015-06-07: 650 mg via ORAL
  Filled 2015-06-07: qty 2

## 2015-06-07 MED ORDER — POLYETHYLENE GLYCOL 3350 17 G PO PACK
17.0000 g | PACK | Freq: Every day | ORAL | Status: DC
  Administered 2015-06-08 – 2015-06-10 (×3): 17 g via ORAL
  Filled 2015-06-07 (×3): qty 1

## 2015-06-07 MED ORDER — ASPIRIN EC 81 MG PO TBEC
81.0000 mg | DELAYED_RELEASE_TABLET | Freq: Every day | ORAL | Status: DC
  Administered 2015-06-07 – 2015-06-10 (×4): 81 mg via ORAL
  Filled 2015-06-07 (×4): qty 1

## 2015-06-07 MED ORDER — IPRATROPIUM-ALBUTEROL 0.5-2.5 (3) MG/3ML IN SOLN
3.0000 mL | RESPIRATORY_TRACT | Status: DC | PRN
Start: 2015-06-07 — End: 2015-06-10
  Administered 2015-06-07 – 2015-06-10 (×9): 3 mL via RESPIRATORY_TRACT
  Filled 2015-06-07 (×9): qty 3

## 2015-06-07 MED ORDER — DEXTROSE 5 % IV SOLN
500.0000 mg | INTRAVENOUS | Status: DC
  Administered 2015-06-07 – 2015-06-08 (×2): 500 mg via INTRAVENOUS
  Filled 2015-06-07 (×3): qty 500

## 2015-06-07 MED ORDER — BUDESONIDE 0.5 MG/2ML IN SUSP
0.5000 mg | Freq: Two times a day (BID) | RESPIRATORY_TRACT | Status: DC
  Administered 2015-06-07 – 2015-06-10 (×7): 0.5 mg via RESPIRATORY_TRACT
  Filled 2015-06-07 (×7): qty 2

## 2015-06-07 MED ORDER — HYDROCOD POLST-CPM POLST ER 10-8 MG/5ML PO SUER
5.0000 mL | Freq: Two times a day (BID) | ORAL | Status: DC | PRN
  Administered 2015-06-07 – 2015-06-08 (×2): 5 mL via ORAL
  Filled 2015-06-07 (×2): qty 5

## 2015-06-07 MED ORDER — IPRATROPIUM-ALBUTEROL 0.5-2.5 (3) MG/3ML IN SOLN: 3.0000 mL | RESPIRATORY_TRACT | Status: DC

## 2015-06-07 MED ORDER — SODIUM CHLORIDE 0.9% FLUSH
3.0000 mL | Freq: Two times a day (BID) | INTRAVENOUS | Status: DC
  Administered 2015-06-07 – 2015-06-10 (×6): 3 mL via INTRAVENOUS

## 2015-06-07 MED ORDER — HEPARIN SODIUM (PORCINE) 5000 UNIT/ML IJ SOLN
5000.0000 [IU] | Freq: Three times a day (TID) | INTRAMUSCULAR | Status: DC
  Administered 2015-06-07 – 2015-06-10 (×10): 5000 [IU] via SUBCUTANEOUS
  Filled 2015-06-07 (×10): qty 1

## 2015-06-07 MED ORDER — MAGNESIUM HYDROXIDE 400 MG/5ML PO SUSP
30.0000 mL | Freq: Once | ORAL | Status: AC
  Administered 2015-06-07: 30 mL via ORAL
  Filled 2015-06-07: qty 30

## 2015-06-07 MED ORDER — LISINOPRIL 5 MG PO TABS
5.0000 mg | ORAL_TABLET | Freq: Every day | ORAL | Status: DC
  Administered 2015-06-07 – 2015-06-10 (×4): 5 mg via ORAL
  Filled 2015-06-07 (×4): qty 1

## 2015-06-07 MED ORDER — ONDANSETRON HCL 4 MG/2ML IJ SOLN: 4.0000 mg | Freq: Four times a day (QID) | INTRAMUSCULAR | Status: DC | PRN

## 2015-06-07 MED ORDER — METOPROLOL TARTRATE 25 MG PO TABS
25.0000 mg | ORAL_TABLET | Freq: Two times a day (BID) | ORAL | Status: DC
  Administered 2015-06-07 – 2015-06-10 (×8): 25 mg via ORAL
  Filled 2015-06-07 (×8): qty 1

## 2015-06-07 MED ORDER — TIOTROPIUM BROMIDE MONOHYDRATE 18 MCG IN CAPS
18.0000 ug | ORAL_CAPSULE | Freq: Every evening | RESPIRATORY_TRACT | Status: DC
  Filled 2015-06-07: qty 5

## 2015-06-07 MED ORDER — ONDANSETRON HCL 4 MG PO TABS
4.0000 mg | ORAL_TABLET | Freq: Four times a day (QID) | ORAL | Status: DC | PRN
  Administered 2015-06-10: 4 mg via ORAL
  Filled 2015-06-07: qty 1

## 2015-06-07 MED ORDER — ACETAMINOPHEN 650 MG RE SUPP: 650.0000 mg | Freq: Four times a day (QID) | RECTAL | Status: DC | PRN

## 2015-06-07 NOTE — Progress Notes (Signed)
PT Cancellation Note  Patient Details Name: Daniel Dickson MRN: [id number] DOB: 08/14/1912   Cancelled Treatment:    Reason Eval/Treat Not Completed: Patient declined, no reason specified;Other (comment) (Pt was not specific about why, talked in circles).  Will try again tomorrow.  Nursing aware.   Ivar Drape 06/07/2015, 5:28 PM   Samul Dada, PT MS Acute Rehab Dept. Number: [id number] [id number] and [facility] [phone]

## 2015-06-07 NOTE — Progress Notes (Signed)
Lab called to notify of elevated troponin of 0.04. MD Hower notified. Orders received to d/c cardiac monitoring. Will continue to monitor.

## 2015-06-07 NOTE — Progress Notes (Signed)
[facility] - [place] at [facility]   PATIENT NAME: Daniel Dickson    MR#:  [id number]  DATE OF BIRTH:  1913/02/13  SUBJECTIVE:  Admitted yesterday with shortness of breath States breathing somewhat improved however still complains of nonproductive cough Currently eating breakfast  REVIEW OF SYSTEMS:  CONSTITUTIONAL: No fever, fatigue or weakness.  EYES: No blurred or double vision.  EARS, NOSE, AND THROAT: No tinnitus or ear pain.  RESPIRATORY: Positive cough, shortness of breath, wheezing denies hemoptysis.  CARDIOVASCULAR: No chest pain, orthopnea, edema.  GASTROINTESTINAL: No nausea, vomiting, diarrhea or abdominal pain.  GENITOURINARY: No dysuria, hematuria.  ENDOCRINE: No polyuria, nocturia,  HEMATOLOGY: No anemia, easy bruising or bleeding SKIN: No rash or lesion. MUSCULOSKELETAL: No joint pain or arthritis.   NEUROLOGIC: No tingling, numbness, weakness.  PSYCHIATRY: No anxiety or depression.   DRUG ALLERGIES:   Allergies  Allergen Reactions  . Cortisone Other (See Comments)    Unknown reaction  . Dye Fdc Red [Red Dye] Other (See Comments)    Unknown reaction.  . Peanut Oil     Other reaction(s): Unknown  . Peanut-Containing Drug Products     Other reaction(s): Unknown  . Penicillins     patient not sure of reaction    VITALS:  Blood pressure 144/83, pulse 72, temperature 97.8 F (36.6 C), temperature source Oral, resp. rate 19, weight 68.266 kg (150 lb 8 oz), SpO2 100 %.  PHYSICAL EXAMINATION:  VITAL SIGNS: Filed Vitals:   06/07/15 0735 06/07/15 1203  BP: 113/61 144/83  Pulse: 87 72  Temp: 97.7 F (36.5 C) 97.8 F (36.6 C)  Resp: 19 19   GENERAL:82 y.o.male currently in no acute distress.  HEAD: Normocephalic, atraumatic.  EYES: Pupils equal, round, reactive to light. Extraocular muscles intact. No scleral icterus.  MOUTH: Moist mucosal membrane. Dentition intact. No abscess noted.  EAR, NOSE, THROAT: Clear without exudates.  No external lesions.  NECK: Supple. No thyromegaly. No nodules. No JVD.  PULMONARY:  diffuse wheeze without rails or rhonci. No use of accessory muscles, Good respiratory effort. good air entry bilaterally CHEST: Nontender to palpation.  CARDIOVASCULAR: S1 and S2. Regular rate and rhythm. No murmurs, rubs, or gallops. No edema. Pedal pulses 2+ bilaterally.  GASTROINTESTINAL: Soft, nontender, nondistended. No masses. Positive bowel sounds. No hepatosplenomegaly.  MUSCULOSKELETAL: No swelling, clubbing, or edema. Range of motion full in all extremities.  NEUROLOGIC: Cranial nerves II through XII are intact. No gross focal neurological deficits. Sensation intact. Reflexes intact.  SKIN: No ulceration, lesions, rashes, or cyanosis. Skin warm and dry. Turgor intact.  PSYCHIATRIC: Mood, affect within normal limits. The patient is awake, alert and oriented x 3. Insight, judgment intact.      LABORATORY PANEL:   CBC  Recent Labs Lab 06/07/15 0038  WBC 9.0  HGB 13.8  HCT 39.7*  PLT 131*   ------------------------------------------------------------------------------------------------------------------  Chemistries   Recent Labs Lab 06/07/15 0038  NA 123*  K 3.5  CL 92*  CO2 22  GLUCOSE 130*  BUN 22*  CREATININE 1.21  CALCIUM 8.0*   ------------------------------------------------------------------------------------------------------------------  Cardiac Enzymes  Recent Labs Lab 06/07/15 1155  TROPONINI 0.04*   ------------------------------------------------------------------------------------------------------------------  RADIOLOGY:  Dg Chest 2 View  06/06/2015  CLINICAL DATA:  82 year old male with difficulty breathing for 1 month and shortness of breath. Wheezing. Fall tonight. EXAM: CHEST  2 VIEW COMPARISON:  Radiographs 01/11/2015 FINDINGS: Stable hyperinflation. Stable bibasilar scarring. Cardiomediastinal contours are normal with atherosclerosis of the aortic  arch. No pulmonary  edema, focal consolidation, pleural effusion or pneumothorax. No acute osseous abnormalities seen. IMPRESSION: Stable hyperinflation and bibasilar scarring. No superimposed acute process. Electronically Signed   By: Rubye Oaks M.D.   On: 06/06/2015 21:39    EKG:   Orders placed or performed during the hospital encounter of 06/06/15  . EKG 12-Lead  . EKG 12-Lead  . ED EKG  . ED EKG    ASSESSMENT AND PLAN:   82 year old Caucasian gentleman history of COPD and non-oxygen dependent presenting with shortness of breath  1.Chronic obstructive pulmonary disease exacerbation: Provide DuoNeb treatments q. 4 hours, decrease Solu-Medrol 60 mg IV q. To daily, continue azithromycin. Continue with home medications 2. Hyponatremia: Check urine osmolality/sodium question poor intake versus SIADH if not consistent with SIADH continue low-dose IV fluids 3.. Essential hypertension: Norvasc lisinopril Lopressor 4. Hyperlipidemia unspecified Pravachol 5. Venous thrombus embolism prophylactic: Heparin  Disposition: Physical therapy evaluation       All the records are reviewed and case discussed with Care Management/Social Workerr. Management plans discussed with the patient, family and they are in agreement.  CODE STATUS: Full  TOTAL TIME TAKING CARE OF THIS PATIENT: 31 minutes.   POSSIBLE D/C IN 2-3 DAYS, DEPENDING ON CLINICAL CONDITION.   Laval Cafaro,  Mardi Mainland.D on 06/07/2015 at 12:44 PM  Between 7am to 6pm - Pager - [phone]  After 6pm: House Pager: - [phone]  [facility]  Office  [phone]  CC: Primary care physician; [name], MD

## 2015-06-08 LAB — BASIC METABOLIC PANEL
ANION GAP: 11 (ref 5–15)
BUN: 33 mg/dL — ABNORMAL HIGH (ref 6–20)
CO2: 26 mmol/L (ref 22–32)
Calcium: 8.9 mg/dL (ref 8.9–10.3)
Chloride: 86 mmol/L — ABNORMAL LOW (ref 101–111)
Creatinine, Ser: 1.43 mg/dL — ABNORMAL HIGH (ref 0.61–1.24)
GFR, EST AFRICAN AMERICAN: 44 mL/min — AB (ref >60)
GFR, EST NON AFRICAN AMERICAN: 38 mL/min — AB (ref >60)
GLUCOSE: 113 mg/dL — AB (ref 65–99)
POTASSIUM: 4.4 mmol/L (ref 3.5–5.1)
SODIUM: 123 mmol/L — AB (ref 135–145)

## 2015-06-08 MED ORDER — AZITHROMYCIN 250 MG PO TABS
500.0000 mg | ORAL_TABLET | Freq: Every day | ORAL | Status: DC
  Administered 2015-06-08 – 2015-06-09 (×2): 500 mg via ORAL
  Filled 2015-06-08 (×2): qty 2

## 2015-06-08 MED ORDER — SODIUM CHLORIDE 0.9 % IV SOLN
INTRAVENOUS | Status: DC
  Administered 2015-06-08 (×2): via INTRAVENOUS

## 2015-06-08 NOTE — Progress Notes (Signed)
Pt continues to be confused regarding his surroundings and reason for hospital stay. Sats remain at 91% on RA. Currently talking with daughter on the phone and seems to be more calm presently.

## 2015-06-08 NOTE — Care Management Important Message (Signed)
Important Message  Patient Details  Name: Daniel Dickson MRN: [id number] Date of Birth: [birth year]   Medicare Important Message Given:  Yes    [name] A Nikoloz Huy 06/08/2015, 10:06 AM

## 2015-06-08 NOTE — Care Management Note (Signed)
Case Management Note  Patient Details  Name: Daniel Dickson MRN: [id number] Date of Birth: 1912/06/24  Subjective/Objective:                   Met with patient and [name] [phone] to discuss discharge planning. Neither are truly interested in SNF as patient was at [facility] in the past and did not like it. He is a [place] patient also but declined transfer. [facility] He also has a civilian PCP Dr. Clemmie Krill. He lives with his mentally handicapped 52 y/o son and daughter [name] assist them both although she does not lives with them. They would like to use [facility] (PT/SW/CNA). He has a working nebulizer in the home. No O2 at home. He has canes and a rolling walker. Action/Plan:  List of home health agencies left with patient/Pat. Referral called to [facility] with [facility]. I have faxed [facility] forms. RNCM will continue to follow.   Expected Discharge Date:                  Expected Discharge Plan:     In-House Referral:     Discharge planning Services     Post Acute Care Choice:  Home Health Choice offered to:  Patient, Adult Children  DME Arranged:    DME Agency:     HH Arranged:  PT [place]:  Ruth  Status of Service:  In process, will continue to follow  Medicare Important Message Given:  Yes Date Medicare IM Given:    Medicare IM give by:    Date Additional Medicare IM Given:    Additional Medicare Important Message give by:     If discussed at [place] of Stay Meetings, dates discussed:    Additional Comments:  Marshell Garfinkel, RN 06/08/2015, 1:10 PM

## 2015-06-08 NOTE — Clinical Social Work Note (Signed)
Clinical Social Work Assessment  Patient Details  Name: Daniel Dickson MRN: [id number] Date of Birth: 02-28-1913  Date of referral:  06/08/15               Reason for consult:  Facility Placement, Family Concerns                Permission sought to share information with:  Family Supports Permission granted to share information::  Yes, Verbal Permission Granted  Name::      Psychologist, forensic::     Relationship::   Daughter   Contact Information:     Housing/Transportation Living arrangements for the past 2 months:  Single Family Home Source of Information:  Patient, Adult Children Patient Interpreter Needed:  None Criminal Activity/Legal Involvement Pertinent to Current Situation/Hospitalization:  No - Comment as needed Significant Relationships:  Adult Children Lives with:  Adult Children Do you feel safe going back to the place where you live?  Yes Need for family participation in patient care:  Yes (Comment)  Care giving concerns:  Patient lives in [place] with his adult son Daniel Dickson who has Down Syndrome.    Social Worker assessment / plan:  Holiday representative ([place]) received verbal consult from RN in progression rounds for possible SNF placement. CSW met with patient and his daughter Daniel Dickson [phone] was at bedside. CSW introduced self and explained role of CSW department. Patient was oriented to self and place. Patient reported that he lives in [place]. Per Daniel Dickson she lives next door and takes care of patient and her brother Daniel Dickson. Per Daniel Dickson when patient is at the hospital Daniel Dickson comes to stay with her. Per Daniel Dickson goes to work in the morning and rides the bus home. Daniel Dickson reported that patient has 5 other children however they do not provide support. Patient has declined PT twice now while at [facility]. Patient and daughter prefer for patient to return home with home health. Per patient he went to [facility] last year for 21 days. CSW explained to patient and daughter that patient's  insurance [employer] will have to approve SNF stay. Daughter reported that she prefers to take patient home. Per daughter patient goes to [facility] once a year for medicine. RN Case Manager is aware of above. CSW will continue to follow and assist as needed.   Employment status:  Retired Nurse, adult PT Recommendations:  Not assessed at this time Information / Referral to community resources:  Other (Comment Required) (Home Health VS SNF )  Patient/Family's Response to care:  Patient and daughter Daniel Dickson prefer for patient to return home.   Patient/Family's Understanding of and Emotional Response to Diagnosis, Current Treatment, and Prognosis: Patient and daughter were pleasant and thanked CSW for visit.   Emotional Assessment Appearance:  Appears younger than stated age Attitude/Demeanor/Rapport:    Affect (typically observed):  Accepting, Adaptable, Pleasant Orientation:  Oriented to Self, Oriented to Place, Fluctuating Orientation (Suspected and/or reported Sundowners) Alcohol / Substance use:  Not Applicable Psych involvement (Current and /or in the community):  No (Comment)  Discharge Needs  Concerns to be addressed:  Discharge Planning Concerns Readmission within the last 30 days:  No Current discharge risk:  Other (Patient is refusing PT ) Barriers to Discharge:  Continued Medical Work up   Daniel Fresh[place], LCSW 06/08/2015, 2:40 PM

## 2015-06-08 NOTE — Progress Notes (Signed)
PT Cancellation Note  Patient Details Name: Daniel Dickson MRN: [id number] DOB: [birth year]   Cancelled Treatment:    Reason Eval/Treat Not Completed: Patient declined, no reason specified;Other (comment).  Will attempt tomorrow, nursing notified.   Lyndel Safe, SPT Lyndel Safe 06/08/2015, 11:53 AM

## 2015-06-08 NOTE — Progress Notes (Signed)
[facility] Physicians -  at [facility]   PATIENT NAME: Daniel Dickson    MR#:  [id number]  DATE OF BIRTH:  22-Dec-1912  SUBJECTIVE:  Still complains of nonproductive cough and chest congestion however shortness of breath somewhat improved  REVIEW OF SYSTEMS:  CONSTITUTIONAL: No fever, fatigue or weakness.  EYES: No blurred or double vision.  EARS, NOSE, AND THROAT: No tinnitus or ear pain.  RESPIRATORY: Positive cough, shortness of breath, wheezing denies hemoptysis.  CARDIOVASCULAR: No chest pain, orthopnea, edema.  GASTROINTESTINAL: No nausea, vomiting, diarrhea or abdominal pain.  GENITOURINARY: No dysuria, hematuria.  ENDOCRINE: No polyuria, nocturia,  HEMATOLOGY: No anemia, easy bruising or bleeding SKIN: No rash or lesion. MUSCULOSKELETAL: No joint pain or arthritis.   NEUROLOGIC: No tingling, numbness, weakness.  PSYCHIATRY: No anxiety or depression.   DRUG ALLERGIES:   Allergies  Allergen Reactions  . Cortisone Other (See Comments)    Unknown reaction  . Dye Fdc Red [Red Dye] Other (See Comments)    Unknown reaction.  . Peanut Oil     Other reaction(s): Unknown  . Peanut-Containing Drug Products     Other reaction(s): Unknown  . Penicillins     patient not sure of reaction    VITALS:  Blood pressure 142/73, pulse 100, temperature 97.4 F (36.3 C), temperature source Oral, resp. rate 20, height  (1.676 m), weight 68.266 kg (150 lb 8 oz), SpO2 93 %.  PHYSICAL EXAMINATION:  VITAL SIGNS: Filed Vitals:   06/08/15 0353 06/08/15 0839  BP: 115/64 142/73  Pulse: 88 100  Temp:  97.4 F (36.3 C)  Resp: 20 20   GENERAL:82 y.o.male currently in no acute distress.  HEAD: Normocephalic, atraumatic.  EYES: Pupils equal, round, reactive to light. Extraocular muscles intact. No scleral icterus.  MOUTH: Moist mucosal membrane. Dentition intact. No abscess noted.  EAR, NOSE, THROAT: Clear without exudates. No external lesions.  NECK: Supple. No  thyromegaly. No nodules. No JVD.  PULMONARY:  Diffuse coarse rhonchi decreased wheezing from yesterday No use of accessory muscles, Good respiratory effort. good air entry bilaterally CHEST: Nontender to palpation.  CARDIOVASCULAR: S1 and S2. Regular rate and rhythm. No murmurs, rubs, or gallops. No edema. Pedal pulses 2+ bilaterally.  GASTROINTESTINAL: Soft, nontender, nondistended. No masses. Positive bowel sounds. No hepatosplenomegaly.  MUSCULOSKELETAL: No swelling, clubbing, or edema. Range of motion full in all extremities.  NEUROLOGIC: Cranial nerves II through XII are intact. No gross focal neurological deficits. Sensation intact. Reflexes intact.  SKIN: No ulceration, lesions, rashes, or cyanosis. Skin warm and dry. Turgor intact.  PSYCHIATRIC: Mood, affect within normal limits. The patient is awake, alert and oriented x 3. Insight, judgment intact.      LABORATORY PANEL:   CBC  Recent Labs Lab 06/07/15 0038  WBC 9.0  HGB 13.8  HCT 39.7*  PLT 131*   ------------------------------------------------------------------------------------------------------------------  Chemistries   Recent Labs Lab 06/08/15 0808  NA 123*  K 4.4  CL 86*  CO2 26  GLUCOSE 113*  BUN 33*  CREATININE 1.43*  CALCIUM 8.9   ------------------------------------------------------------------------------------------------------------------  Cardiac Enzymes  Recent Labs Lab 06/07/15 1155  TROPONINI 0.04*   ------------------------------------------------------------------------------------------------------------------  RADIOLOGY:  Dg Chest 2 View  06/06/2015  CLINICAL DATA:  82 year old male with difficulty breathing for 1 month and shortness of breath. Wheezing. Fall tonight. EXAM: CHEST  2 VIEW COMPARISON:  Radiographs 01/11/2015 FINDINGS: Stable hyperinflation. Stable bibasilar scarring. Cardiomediastinal contours are normal with atherosclerosis of the aortic arch. No pulmonary edema,  focal  consolidation, pleural effusion or pneumothorax. No acute osseous abnormalities seen. IMPRESSION: Stable hyperinflation and bibasilar scarring. No superimposed acute process. Electronically Signed   By: Rubye Oaks M.D.   On: 06/06/2015 21:39    EKG:   Orders placed or performed during the hospital encounter of 06/06/15  . EKG 12-Lead  . EKG 12-Lead  . ED EKG  . ED EKG    ASSESSMENT AND PLAN:   81 year old Caucasian gentleman history of COPD and non-oxygen dependent presenting with shortness of breath  1.Chronic obstructive pulmonary disease exacerbation: Provide DuoNeb treatments q. 4 hours, decrease Solu-Medrol 60 mg IV q. To daily, continue azithromycin. Continue with home medications 2. Hyponatremia: Start low-dose IV fluid hydration and follow sodium level 3.. Essential hypertension: Norvasc lisinopril Lopressor 4. Hyperlipidemia unspecified Pravachol 5. Venous thrombus embolism prophylactic: Heparin  Disposition: Home with home health     All the records are reviewed and case discussed with Care Management/Social Workerr. Management plans discussed with the patient, family and they are in agreement.  CODE STATUS: Full  TOTAL TIME TAKING CARE OF THIS PATIENT: 28 minutes.   POSSIBLE D/C IN 2-3 DAYS, DEPENDING ON CLINICAL CONDITION.   Lucia Mccreadie,  Mardi Mainland.D on 06/08/2015 at 1:36 PM  Between 7am to 6pm - Pager - [phone]  After 6pm: House Pager: - [phone]  [facility]  Office  [phone]  CC: Primary care physician; [name], MD

## 2015-06-08 NOTE — [place] FL2 (Signed)
[place] MEDICAID FL2 LEVEL OF CARE SCREENING TOOL     IDENTIFICATION  Patient Name: Daniel Dickson Birthdate: Mar 07, 1913 Sex: male Admission Date (Current Location): 06/06/2015  [place] and [place] Number:  Chiropodist and Address:  [facility], [address] [place], [place] [address]      Provider Number: [id number]  Attending Physician Name and Address:  Wyatt Haste, MD  Relative Name and Phone Number:       Current Level of Care: Hospital Recommended Level of Care: Skilled Nursing Facility Prior Approval Number:    Date Approved/Denied:   PASRR Number:  [id number])  Discharge Plan: SNF    Current Diagnoses: Patient Active Problem List   Diagnosis Date Noted  . Hyponatremia 06/06/2015  . Coronary artery disease   . Hypertensive heart disease   . Mixed hyperlipidemia   . COPD (chronic obstructive pulmonary disease) (HCC)   . COPD exacerbation (HCC) 01/11/2015  . Fall 09/03/2014  . Hip fracture (HCC) 09/03/2014  . PTSD (post-traumatic stress disorder) 09/03/2014  . Bilateral leg edema 09/03/2014  . SOB (shortness of breath) 10/31/2012  . Essential hypertension 10/31/2012  . CAD (coronary artery disease) 10/31/2012  . Hyperlipidemia 10/31/2012  . Tachycardia 10/31/2012    Orientation RESPIRATION BLADDER Height & Weight     Self, Time, Place  Normal Continent Weight: 150 lb 8 oz (68.266 kg) Height:   (167.6 cm)  BEHAVIORAL SYMPTOMS/MOOD NEUROLOGICAL BOWEL NUTRITION STATUS   (none )  (none ) Continent Diet (Diet: Heart Healthy )  AMBULATORY STATUS COMMUNICATION OF NEEDS Skin   Extensive Assist Verbally Normal                       Personal Care Assistance Level of Assistance  Bathing, Feeding, Dressing Bathing Assistance: Limited assistance Feeding assistance: Independent Dressing Assistance: Limited assistance     Functional Limitations Info  Sight, Hearing, Speech Sight Info: Adequate Hearing  Info: Impaired Speech Info: Adequate    SPECIAL CARE FACTORS FREQUENCY  PT (By licensed PT), OT (By licensed OT)     PT Frequency:  (5) OT Frequency:  (5)            Contractures      Additional Factors Info  Code Status, Allergies Code Status Info:  (Full Code. ) Allergies Info:  (Cortisone, Dye Fdc Red Red Dye, Peanut Oil, Peanut-containing Drug Products, Penicillins)           Current Medications (06/08/2015):  This is the current hospital active medication list Current Facility-Administered Medications  Medication Dose Route Frequency Provider Last Rate Last Dose  . 0.9 %  sodium chloride infusion   Intravenous Continuous Wyatt Haste, MD 75 mL/hr at 06/08/15 1016    . acetaminophen (TYLENOL) tablet 650 mg  650 mg Oral Q6H PRN Oralia Manis, MD   650 mg at 06/07/15 5621   Or  . acetaminophen (TYLENOL) suppository 650 mg  650 mg Rectal Q6H PRN Oralia Manis, MD      . amLODipine (NORVASC) tablet 5 mg  5 mg Oral Daily Oralia Manis, MD   5 mg at 06/08/15 3086  . aspirin EC tablet 81 mg  81 mg Oral Daily Oralia Manis, MD   81 mg at 06/08/15 5784  . azithromycin (ZITHROMAX) 500 mg in dextrose 5 % 250 mL IVPB  500 mg Intravenous Q24H Oralia Manis, MD   500 mg at 06/08/15 0046  . budesonide (PULMICORT) nebulizer solution 0.5  mg  0.5 mg Nebulization BID Wyatt Haste, MD   0.5 mg at 06/08/15 0815  . chlorpheniramine-HYDROcodone (TUSSIONEX) 10-8 MG/5ML suspension 5 mL  5 mL Oral Q12H PRN Wyatt Haste, MD   5 mL at 06/08/15 0045  . heparin injection 5,000 Units  5,000 Units Subcutaneous 3 times per day Oralia Manis, MD   5,000 Units at 06/08/15 1331  . ipratropium-albuterol (DUONEB) 0.5-2.5 (3) MG/3ML nebulizer solution 3 mL  3 mL Nebulization Q4H PRN Wyatt Haste, MD   3 mL at 06/08/15 0333  . lisinopril (PRINIVIL,ZESTRIL) tablet 5 mg  5 mg Oral Daily Oralia Manis, MD   5 mg at 06/08/15 0827  . methylPREDNISolone sodium succinate (SOLU-MEDROL) 125 mg/2 mL injection 60 mg  60 mg  Intravenous Q24H Wyatt Haste, MD   60 mg at 06/08/15 1610  . metoprolol tartrate (LOPRESSOR) tablet 25 mg  25 mg Oral BID Oralia Manis, MD   25 mg at 06/08/15 0827  . ondansetron (ZOFRAN) tablet 4 mg  4 mg Oral Q6H PRN Oralia Manis, MD       Or  . ondansetron [facility]) injection 4 mg  4 mg Intravenous Q6H PRN Oralia Manis, MD      . polyethylene glycol [facility] / GLYCOLAX) packet 17 g  17 g Oral Daily Oralia Manis, MD   17 g at 06/08/15 9604  . pravastatin (PRAVACHOL) tablet 80 mg  80 mg Oral Daily Oralia Manis, MD   80 mg at 06/08/15 5409  . sodium chloride flush (NS) 0.9 % injection 3 mL  3 mL Intravenous Q12H Oralia Manis, MD   3 mL at 06/08/15 0839  . tamsulosin (FLOMAX) capsule 0.4 mg  0.4 mg Oral Daily Oralia Manis, MD   0.4 mg at 06/08/15 0827  . tiotropium (SPIRIVA) inhalation capsule 18 mcg  18 mcg Inhalation QHS Wyatt Haste, MD   18 mcg at 06/07/15 2224     Discharge Medications: Please see discharge summary for a list of discharge medications.  Relevant Imaging Results:  Relevant Lab Results:   Additional Information  (SSN: [id number])  Haig Prophet, LCSW

## 2015-06-09 LAB — BASIC METABOLIC PANEL
Anion gap: 7 (ref 5–15)
BUN: 36 mg/dL — AB (ref 6–20)
CALCIUM: 8.4 mg/dL — AB (ref 8.9–10.3)
CO2: 24 mmol/L (ref 22–32)
CREATININE: 1.2 mg/dL (ref 0.61–1.24)
Chloride: 92 mmol/L — ABNORMAL LOW (ref 101–111)
GFR calc Af Amer: 55 mL/min — ABNORMAL LOW (ref >60)
GFR calc non Af Amer: 47 mL/min — ABNORMAL LOW (ref >60)
GLUCOSE: 103 mg/dL — AB (ref 65–99)
POTASSIUM: 4.7 mmol/L (ref 3.5–5.1)
SODIUM: 123 mmol/L — AB (ref 135–145)

## 2015-06-09 NOTE — Evaluation (Signed)
Physical Therapy Evaluation Patient Details Name: Daniel Dickson MRN: [id number] DOB: [birth year] Today's Date: 06/09/2015   History of Present Illness  Pt is a 82 y.o. male with PMH of COPD, PTSD, and coronary artery disease.  Pt presented with SOB, tachycardic, tachypenic and had a fall (06-06-15).  Pt was admitted for COPD exaberation.   Clinical Impression  Prior to admission pt was independent with rollator.  Pt has had 2 falls in the last year.  Pt lives with son who has Down's syndrome.  Pt's daughter is next door neighbor and cares for both pt and son.  Pt was min assist with bed mobility, CGA for sit to stand with RW and CGA for ambulation of 5 feet with RW.  Pt required constant VC and tactile cues for walker placement during ambulation.  Pt vitals were O2 saturation: 94% O2 (room air) and HR: 93 bpm lying in bed.   After ambulation, pt's vitals were: O2 saturation: 89% O2 (room air) and HR: 102 bpm. Pt was instructed in diaphragmatic breathing and vitals became [facility].  Due to aforementioned function and strength deficits, pt is in need of skilled physical therapy.  It is recommended that pt is discharged to home, with home health PT and 24/7 assist for functional mobility when medically appropriate.    Follow Up Recommendations Home health PT (24/7 assist)    Equipment Recommendations       Recommendations for Other Services       Precautions / Restrictions Precautions Precautions: Fall Restrictions Weight Bearing Restrictions: No      Mobility  Bed Mobility Overal bed mobility: Needs Assistance Bed Mobility: Rolling;Sidelying to Sit Rolling: Min assist Sidelying to sit: Min assist       General bed mobility comments: required increased time and VC and tactile cues for hand placement   Transfers Overall transfer level: Needs assistance Equipment used: Rolling walker (2 wheeled) Transfers: Sit to/from Stand Sit to Stand: Min guard         General transfer  comment: required increased time and intermittent VC and tactile cues for hand placement.    Ambulation/Gait Ambulation/Gait assistance: Min guard Ambulation Distance (Feet): 5 Feet Assistive device: Rolling walker (2 wheeled) Gait Pattern/deviations: Decreased step length - left;Decreased step length - right Gait velocity: decreased   General Gait Details: required constant VC and tactiles cues for walker placement.  Also performed a 360 turn with RW.  Stairs            Wheelchair Mobility    Modified Rankin (Stroke Patients Only)       Balance Overall balance assessment: Needs assistance Sitting-balance support: Bilateral upper extremity supported;Feet supported (bed rail) Sitting balance-Leahy Scale: Fair     Standing balance support: Bilateral upper extremity supported (RW) Standing balance-Leahy Scale: Fair                               Pertinent Vitals/Pain Pain Assessment: No/denies pain  See flow sheet for vitals.     Home Living Family/patient expects to be discharged to:: Private residence Living Arrangements: Alone;Children (Son has down's syndrome and needs assistance.) Available Help at Discharge: Family Type of Home: House Home Access: Ramped entrance (Bilateral rails with ramps)     Home Layout: One level Home Equipment: Walker - 4 wheels      Prior Function Level of Independence: Independent with assistive device(s)         Comments:  rollator      Hand Dominance   Dominant Hand: Right    Extremity/Trunk Assessment   Upper Extremity Assessment: Overall WFL for tasks assessed           Lower Extremity Assessment: Generalized weakness      Cervical / Trunk Assessment: Normal  Communication   Communication: No difficulties  Cognition Arousal/Alertness: Awake/alert Behavior During Therapy: WFL for tasks assessed/performed;Impulsive (decreased safety awareness) Overall Cognitive Status: Within Functional Limits  for tasks assessed                      General Comments   Nursing was contacted and cleared pt for physical therapy.  Pt was agreeable and tolerated session well.     Exercises        Assessment/Plan    PT Assessment Patient needs continued PT services  PT Diagnosis Generalized weakness   PT Problem List Decreased strength;Decreased range of motion;Decreased activity tolerance;Decreased balance;Decreased mobility;Decreased coordination;Decreased safety awareness  PT Treatment Interventions DME instruction;Gait training;Stair training;Functional mobility training;Patient/family education;Therapeutic activities;Therapeutic exercise;Balance training   PT Goals (Current goals can be found in the Care Plan section) Acute Rehab PT Goals Patient Stated Goal: to go home PT Goal Formulation: With patient Time For Goal Achievement: 06/23/15 Potential to Achieve Goals: Good    Frequency Min 2X/week   Barriers to discharge        Co-evaluation               End of Session Equipment Utilized During Treatment: Gait belt Activity Tolerance: Patient tolerated treatment well Patient left: in chair;with call bell/phone within reach;with chair alarm set Nurse Communication: Mobility status         Time: 6578-4696 PT Time Calculation (min) (ACUTE ONLY): 38 min   Charges:         PT G Codes:       Lyndel Safe, SPT Lyndel Safe 06/09/2015, 4:16 PM

## 2015-06-09 NOTE — Progress Notes (Signed)
PT Cancellation Note  Patient Details Name: Daniel Dickson MRN: [id number] DOB: 05/07/1912   Cancelled Treatment:    Reason Eval/Treat Not Completed: Patient declined, no reason specified.  Pt declined; however, pt's daughter encouraged him to do PT after finishing lunch.  Will re-attempt after lunch.  Lyndel Safe, SPT Lyndel Safe 06/09/2015, 12:00 PM

## 2015-06-09 NOTE — Plan of Care (Signed)
Problem: Safety: Goal: Ability to remain free from injury will improve Outcome: Progressing Impulsive at times. Bed alarm activated. Pt rounded on hourly and personal needs met.  Problem: Pain Managment: Goal: General experience of comfort will improve Outcome: Completed/Met Date Met:  06/09/15 No complaints of pain.  Problem: Fluid Volume: Goal: Ability to maintain a balanced intake and output will improve Outcome: Progressing Pt still remaining on iv fluids.

## 2015-06-09 NOTE — Progress Notes (Signed)
[facility] - [place] at [facility]   PATIENT NAME: Daniel Dickson    MR#:  [id number]  DATE OF BIRTH:  03/08/1913  SUBJECTIVE:  Cough has improved, still complains about how hot coffee is, complains of physical therapy came while he was  eating But no medical complaints  REVIEW OF SYSTEMS:  CONSTITUTIONAL: No fever, fatigue or weakness.  EYES: No blurred or double vision.  EARS, NOSE, AND THROAT: No tinnitus or ear pain.  RESPIRATORY: Positive cough, shortness of breath, wheezing denies hemoptysis.  CARDIOVASCULAR: No chest pain, orthopnea, edema.  GASTROINTESTINAL: No nausea, vomiting, diarrhea or abdominal pain.  GENITOURINARY: No dysuria, hematuria.  ENDOCRINE: No polyuria, nocturia,  HEMATOLOGY: No anemia, easy bruising or bleeding SKIN: No rash or lesion. MUSCULOSKELETAL: No joint pain or arthritis.   NEUROLOGIC: No tingling, numbness, weakness.  PSYCHIATRY: No anxiety or depression.   DRUG ALLERGIES:   Allergies  Allergen Reactions  . Cortisone Other (See Comments)    Unknown reaction  . Dye Fdc Red [Red Dye] Other (See Comments)    Unknown reaction.  . Peanut Oil     Other reaction(s): Unknown  . Peanut-Containing Drug Products     Other reaction(s): Unknown  . Penicillins     patient not sure of reaction    VITALS:  Blood pressure 117/69, pulse 95, temperature 97.5 F (36.4 C), temperature source Oral, resp. rate 19, height  (1.676 m), weight 68.266 kg (150 lb 8 oz), SpO2 93 %.  PHYSICAL EXAMINATION:  VITAL SIGNS: Filed Vitals:   06/09/15 0316 06/09/15 0833  BP: 141/68 117/69  Pulse: 88 95  Temp: 98 F (36.7 C) 97.5 F (36.4 C)  Resp: 19    GENERAL:82 y.o.male currently in no acute distress.  HEAD: Normocephalic, atraumatic.  EYES: Pupils equal, round, reactive to light. Extraocular muscles intact. No scleral icterus.  MOUTH: Moist mucosal membrane. Dentition intact. No abscess noted.  EAR, NOSE, THROAT: Clear without  exudates. No external lesions.  NECK: Supple. No thyromegaly. No nodules. No JVD.  PULMONARY:  Improving coarse rhonchi no wheezing  No use of accessory muscles, Good respiratory effort. good air entry bilaterally CHEST: Nontender to palpation.  CARDIOVASCULAR: S1 and S2. Regular rate and rhythm. No murmurs, rubs, or gallops. No edema. Pedal pulses 2+ bilaterally.  GASTROINTESTINAL: Soft, nontender, nondistended. No masses. Positive bowel sounds. No hepatosplenomegaly.  MUSCULOSKELETAL: No swelling, clubbing, or edema. Range of motion full in all extremities.  NEUROLOGIC: Cranial nerves II through XII are intact. No gross focal neurological deficits. Sensation intact. Reflexes intact.  SKIN: No ulceration, lesions, rashes, or cyanosis. Skin warm and dry. Turgor intact.  PSYCHIATRIC: Mood, affect within normal limits. The patient is awake, alert and oriented x 3. Insight, judgment intact.      LABORATORY PANEL:   CBC  Recent Labs Lab 06/07/15 0038  WBC 9.0  HGB 13.8  HCT 39.7*  PLT 131*   ------------------------------------------------------------------------------------------------------------------  Chemistries   Recent Labs Lab 06/09/15 0601  NA 123*  K 4.7  CL 92*  CO2 24  GLUCOSE 103*  BUN 36*  CREATININE 1.20  CALCIUM 8.4*   ------------------------------------------------------------------------------------------------------------------  Cardiac Enzymes  Recent Labs Lab 06/07/15 1155  TROPONINI 0.04*   ------------------------------------------------------------------------------------------------------------------  RADIOLOGY:  No results found.  EKG:   Orders placed or performed during the hospital encounter of 06/06/15  . EKG 12-Lead  . EKG 12-Lead  . ED EKG  . ED EKG    ASSESSMENT AND PLAN:   82 year old Caucasian  gentleman history of COPD and non-oxygen dependent presenting with shortness of breath  1.Chronic obstructive pulmonary  disease exacerbation: Provide DuoNeb treatments q. 4 hours, decrease Solu-Medrol 60 mg IV q. To daily, continue azithromycin. Continue with home medications 2. 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 Essential hypertension: Norvasc lisinopril Lopressor 4. Hyperlipidemia unspecified Pravachol 5. Venous thrombus embolism prophylactic: Heparin  Disposition: Home with home health potential discharge tomorrow if sodium improving  Discussed with patient's daughter [phone] giving a general update ,   All the records are reviewed and case discussed with Care Management/Social Workerr. Management plans discussed with the patient, family and they are in agreement.  CODE STATUS: Full  TOTAL TIME TAKING CARE OF THIS PATIENT: 28 minutes.   POSSIBLE D/C IN 1-2 DAYS, DEPENDING ON CLINICAL CONDITION.   Ivonne Freeburg,  Mardi Mainland.D on 06/09/2015 at 1:56 PM  Between 7am to 6pm - Pager - [phone]  After 6pm: House Pager: - [phone]  [facility]  Office  [phone]  CC: Primary care physician; [name], MD

## 2015-06-10 LAB — BASIC METABOLIC PANEL
ANION GAP: 7 (ref 5–15)
BUN: 31 mg/dL — ABNORMAL HIGH (ref 6–20)
CO2: 26 mmol/L (ref 22–32)
Calcium: 8.6 mg/dL — ABNORMAL LOW (ref 8.9–10.3)
Chloride: 94 mmol/L — ABNORMAL LOW (ref 101–111)
Creatinine, Ser: 1.07 mg/dL (ref 0.61–1.24)
GFR calc Af Amer: >60 mL/min (ref >60)
GFR calc non Af Amer: 54 mL/min — ABNORMAL LOW (ref >60)
GLUCOSE: 107 mg/dL — AB (ref 65–99)
POTASSIUM: 4.4 mmol/L (ref 3.5–5.1)
Sodium: 127 mmol/L — ABNORMAL LOW (ref 135–145)

## 2015-06-10 MED ORDER — AZITHROMYCIN 250 MG PO TABS: 250.0000 mg | ORAL_TABLET | Freq: Every day | ORAL | Status: DC

## 2015-06-10 MED ORDER — PREDNISONE 10 MG (21) PO TBPK: 10.0000 mg | ORAL_TABLET | Freq: Every day | ORAL | Status: DC

## 2015-06-10 MED ORDER — ALUM & MAG HYDROXIDE-SIMETH 200-200-20 MG/5ML PO SUSP
30.0000 mL | Freq: Once | ORAL | Status: AC
  Administered 2015-06-10: 30 mL via ORAL
  Filled 2015-06-10: qty 30

## 2015-06-10 NOTE — Discharge Summary (Addendum)
[facility] - [place] at [facility]   PATIENT NAME: Daniel Dickson    MR#:  [id number]  DATE OF BIRTH:  1912/06/30  DATE OF ADMISSION:  06/06/2015 ADMITTING PHYSICIAN: Oralia Manis, MD  DATE OF DISCHARGE: No discharge date for patient encounter.  PRIMARY CARE PHYSICIAN: BLISS, Doreene Nest, MD    ADMISSION DIAGNOSIS:  Chronic obstructive pulmonary disease, unspecified COPD type (HCC) [J44.9]  DISCHARGE DIAGNOSIS:  COPD exacerbation Hyponatremia  SECONDARY DIAGNOSIS:   Past Medical History  Diagnosis Date  . Hypertensive heart disease   . PTSD (post-traumatic stress disorder)     with headaches  . Mixed hyperlipidemia   . Coronary artery disease     a. 2012 s/p MI/cardiac arrest--> PCI mRCA.  Marland Kitchen COPD (chronic obstructive pulmonary disease) (HCC)   . Anxiety     HOSPITAL COURSE:  Daniel Dickson  is a 81 y.o. male admitted 06/06/2015 with chief complaint shortness of breath. Please see H&P performed by Dr. Patrecia Pace further information. Patient presented with the above complaints noted to be short of breath with associated wheezing no fevers chills no pneumonia on chest x-ray. Started on breathing treatments steroids and azithromycin. He also noted to have issues with hyponatremia which has improved with increased oral intake from patient. His respiratory status has also improved. He is evaluated multiple times with physical therapy and deemed appropriate for home with home health  DISCHARGE CONDITIONS:   Stable  CONSULTS OBTAINED:  Treatment Team:  Mady Haagensen, MD  DRUG ALLERGIES:   Allergies  Allergen Reactions  . Cortisone Other (See Comments)    Unknown reaction  . Dye Fdc Red [Red Dye] Other (See Comments)    Unknown reaction.  . Peanut Oil     Other reaction(s): Unknown  . Peanut-Containing Drug Products     Other reaction(s): Unknown  . Penicillins     patient not sure of reaction    DISCHARGE MEDICATIONS:   Current Discharge Medication  List    START taking these medications   Details  azithromycin (ZITHROMAX) 250 MG tablet Take 1 tablet (250 mg total) by mouth daily. Qty: 2 each, Refills: 0      CONTINUE these medications which have CHANGED   Details  predniSONE (STERAPRED UNI-PAK 21 TAB) 10 MG (21) TBPK tablet Take 1 tablet (10 mg total) by mouth daily.  oral 1 day, then  oral for 2 days, then  oral 2 days, then stop Qty: 10 tablet, Refills: 0      CONTINUE these medications which have NOT CHANGED   Details  acetaminophen (TYLENOL) 500 MG tablet Take 500 mg by mouth daily as needed for mild pain or moderate pain.     albuterol (PROAIR HFA) 108 (90 BASE) MCG/ACT inhaler Inhale 2 puffs into the lungs every 6 (six) hours as needed. For wheezing.    amLODipine (NORVASC) 5 MG tablet Take 1 tablet (5 mg total) by mouth daily. Qty: 90 tablet, Refills: 4    aspirin EC 81 MG tablet Take 81 mg by mouth daily.    benzonatate (TESSALON) 100 MG capsule Take 1 capsule (100 mg total) by mouth 3 (three) times daily as needed for cough. Qty: 20 capsule, Refills: 0    Cholecalciferol (VITAMIN D3) 1000 UNITS CAPS Take 1,000 Units by mouth daily.    FUROSEMIDE PO Take 1 tablet by mouth daily as needed (fluid).    lisinopril (PRINIVIL,ZESTRIL) 5 MG tablet Take 5 mg by mouth daily.    loratadine (CLARITIN) 10  MG tablet Take 10 mg by mouth daily.    metoprolol tartrate (LOPRESSOR) 25 MG tablet Take 25 mg by mouth 2 (two) times daily.    pravastatin (PRAVACHOL) 80 MG tablet Take 80 mg by mouth daily.    tamsulosin (FLOMAX) 0.4 MG CAPS capsule Take 0.4 mg by mouth daily. Take 30 minutes after same meal each day.    tiotropium (SPIRIVA) 18 MCG inhalation capsule Place 1 capsule (18 mcg total) into inhaler and inhale daily. Qty: 30 capsule, Refills: 12    ferrous sulfate 325 (65 FE) MG EC tablet Take 325 mg by mouth 2 (two) times daily. Reported on 06/07/2015    nitroGLYCERIN (NITROSTAT) 0.4 MG SL tablet Place 0.4  mg under the tongue every 5 (five) minutes as needed for chest pain. Reported on 06/07/2015      STOP taking these medications     albuterol (PROVENTIL) (2.5 MG/3ML) 0.083% nebulizer solution      levofloxacin (LEVAQUIN) 250 MG tablet          DISCHARGE INSTRUCTIONS:    DIET:  Regular diet  DISCHARGE CONDITION:  Stable  ACTIVITY:  Activity as tolerated  OXYGEN:  Home Oxygen: Yes.     Oxygen Delivery: 2 L via nasal cannula  DISCHARGE LOCATION:  home   If you experience worsening of your admission symptoms, develop shortness of breath, life threatening emergency, suicidal or homicidal thoughts you must seek medical attention immediately by calling 911 or calling your MD immediately  if symptoms less severe.  You Must read complete instructions/literature along with all the possible adverse reactions/side effects for all the Medicines you take and that have been prescribed to you. Take any new Medicines after you have completely understood and accpet all the possible adverse reactions/side effects.   Please note  You were cared for by a hospitalist during your hospital stay. If you have any questions about your discharge medications or the care you received while you were in the hospital after you are discharged, you can call the unit and asked to speak with the hospitalist on call if the hospitalist that took care of you is not available. Once you are discharged, your primary care physician will handle any further medical issues. Please note that NO REFILLS for any discharge medications will be authorized once you are discharged, as it is imperative that you return to your primary care physician (or establish a relationship with a primary care physician if you do not have one) for your aftercare needs so that they can reassess your need for medications and monitor your lab values.    On the day of Discharge:   VITAL SIGNS:  Blood pressure 163/73, pulse 90, temperature 97.5  F (36.4 C), temperature source Oral, resp. rate 17, height  (1.676 m), weight 68.266 kg (150 lb 8 oz), SpO2 92 %.  I/O:   Intake/Output Summary (Last 24 hours) at 06/10/15 1117 Last data filed at 06/10/15 1002  Gross per 24 hour  Intake   1040 ml  Output    950 ml  Net     90 ml    PHYSICAL EXAMINATION:  GENERAL:  82 y.o.-year-old patient lying in the bed with no acute distress.  EYES: Pupils equal, round, reactive to light and accommodation. No scleral icterus. Extraocular muscles intact.  HEENT: Head atraumatic, normocephalic. Oropharynx and nasopharynx clear.  NECK:  Supple, no jugular venous distention. No thyroid enlargement, no tenderness.  LUNGS: Normal breath sounds bilaterally, no wheezing, rales,rhonchi or  crepitation. No use of accessory muscles of respiration.  CARDIOVASCULAR: S1, S2 normal. No murmurs, rubs, or gallops.  ABDOMEN: Soft, non-tender, non-distended. Bowel sounds present. No organomegaly or mass.  EXTREMITIES: No pedal edema, cyanosis, or clubbing.  NEUROLOGIC: Cranial nerves II through XII are intact. Muscle strength 5/5 in all extremities. Sensation intact. Gait not checked.  PSYCHIATRIC: The patient is alert and oriented x 3.  SKIN: No obvious rash, lesion, or ulcer.   DATA REVIEW:   CBC  Recent Labs Lab 06/07/15 0038  WBC 9.0  HGB 13.8  HCT 39.7*  PLT 131*    Chemistries   Recent Labs Lab 06/10/15 0544  NA 127*  K 4.4  CL 94*  CO2 26  GLUCOSE 107*  BUN 31*  CREATININE 1.07  CALCIUM 8.6*    Cardiac Enzymes  Recent Labs Lab 06/07/15 1155  TROPONINI 0.04*    Microbiology Results  No results found for this or any previous visit.  RADIOLOGY:  No results found.   Management plans discussed with the patient, family and they are in agreement.  CODE STATUS:     Code Status Orders        Start     Ordered   06/07/15 0005  Full code   Continuous     06/07/15 0004    Code Status History    Date Active Date  Inactive Code Status Order ID Comments User Context   01/11/2015  1:54 PM 01/12/2015  1:58 PM Full Code [id number]  Alford Highland, MD ED    Advance Directive Documentation        Most Recent Value   Type of Advance Directive  Healthcare Power of Attorney, Living will   Pre-existing out of facility DNR order (yellow form or pink MOST form)     "MOST" Form in Place?        TOTAL TIME TAKING CARE OF THIS PATIENT: 28 minutes.    Hower,  Mardi Mainland.D on 06/10/2015 at 11:17 AM  Between 7am to 6pm - Pager - [phone]  After 6pm go to [email or web address] - password [email or web address]  [facility]  Office  [phone]  CC: Primary care physician; [name], MD

## 2015-06-10 NOTE — Progress Notes (Addendum)
SATURATION QUALIFICATIONS: (This note is used to comply with regulatory documentation for home oxygen)  Patient Saturations on Room Air at Rest = 92%  Patient Saturations on Room Air while Ambulating = 88%  Patient Saturations on Liters of oxygen while Ambulating = 88%  Please briefly explain why patient needs home oxygen:COPD

## 2015-06-10 NOTE — Progress Notes (Signed)
Completed discharge instructions with patient and daughter, no questions.  Discharging home with home health.

## 2015-06-10 NOTE — Progress Notes (Signed)
PT is recommending home health. RN Case Manager is aware of above. Patient's daughter Dennie Bible is agreeable to take patient home. Please reconsult if future social work needs arise. CSW signing off.   Jetta Lout, LCSW [phone]

## 2015-06-10 NOTE — Care Management (Signed)
Patient discharging home today followed by [facility]. I have notified Barbara Cower with [facility] of patient discharge. He is now needing O2 and I have requested from [facility]. I will fax notes to [facility]. No further RNCM needs. Case closed.

## 2015-06-10 NOTE — Care Management Important Message (Signed)
Important Message  Patient Details  Name: Daniel Dickson MRN: [id number] Date of Birth: 1912-06-19   Medicare Important Message Given:  Yes    [name] A Terrell Shimko 06/10/2015, 9:49 AM

## 2015-10-07 ENCOUNTER — Encounter: Payer: Self-pay | Admitting: Emergency Medicine

## 2015-10-07 ENCOUNTER — Emergency Department: Payer: Medicare PPO

## 2015-10-07 ENCOUNTER — Inpatient Hospital Stay
Admission: EM | Admit: 2015-10-07 | Discharge: 2015-10-09 | DRG: 190 | Disposition: A | Discharge status: Home Health | Payer: Medicare PPO | Attending: Internal Medicine | Admitting: Internal Medicine

## 2015-10-07 DIAGNOSIS — Z9049 Acquired absence of other specified parts of digestive tract: Secondary | ICD-10-CM | POA: Diagnosis not present

## 2015-10-07 DIAGNOSIS — Z955 Presence of coronary angioplasty implant and graft: Secondary | ICD-10-CM | POA: Diagnosis not present

## 2015-10-07 DIAGNOSIS — I119 Hypertensive heart disease without heart failure: Secondary | ICD-10-CM | POA: Diagnosis present

## 2015-10-07 DIAGNOSIS — E871 Hypo-osmolality and hyponatremia: Secondary | ICD-10-CM | POA: Diagnosis present

## 2015-10-07 DIAGNOSIS — N401 Enlarged prostate with lower urinary tract symptoms: Secondary | ICD-10-CM | POA: Diagnosis present

## 2015-10-07 DIAGNOSIS — Z79899 Other long term (current) drug therapy: Secondary | ICD-10-CM | POA: Diagnosis not present

## 2015-10-07 DIAGNOSIS — E782 Mixed hyperlipidemia: Secondary | ICD-10-CM | POA: Diagnosis present

## 2015-10-07 DIAGNOSIS — Z888 Allergy status to other drugs, medicaments and biological substances status: Secondary | ICD-10-CM | POA: Diagnosis not present

## 2015-10-07 DIAGNOSIS — I252 Old myocardial infarction: Secondary | ICD-10-CM | POA: Diagnosis not present

## 2015-10-07 DIAGNOSIS — F4310 Post-traumatic stress disorder, unspecified: Secondary | ICD-10-CM | POA: Diagnosis present

## 2015-10-07 DIAGNOSIS — Z91010 Allergy to peanuts: Secondary | ICD-10-CM

## 2015-10-07 DIAGNOSIS — Z825 Family history of asthma and other chronic lower respiratory diseases: Secondary | ICD-10-CM

## 2015-10-07 DIAGNOSIS — R0600 Dyspnea, unspecified: Secondary | ICD-10-CM | POA: Diagnosis present

## 2015-10-07 DIAGNOSIS — Z8674 Personal history of sudden cardiac arrest: Secondary | ICD-10-CM

## 2015-10-07 DIAGNOSIS — Z7951 Long term (current) use of inhaled steroids: Secondary | ICD-10-CM | POA: Diagnosis not present

## 2015-10-07 DIAGNOSIS — J441 Chronic obstructive pulmonary disease with (acute) exacerbation: Secondary | ICD-10-CM | POA: Diagnosis present

## 2015-10-07 DIAGNOSIS — Z9981 Dependence on supplemental oxygen: Secondary | ICD-10-CM

## 2015-10-07 DIAGNOSIS — Z91041 Radiographic dye allergy status: Secondary | ICD-10-CM

## 2015-10-07 DIAGNOSIS — R338 Other retention of urine: Secondary | ICD-10-CM | POA: Diagnosis present

## 2015-10-07 DIAGNOSIS — F419 Anxiety disorder, unspecified: Secondary | ICD-10-CM | POA: Diagnosis present

## 2015-10-07 DIAGNOSIS — R51 Headache: Secondary | ICD-10-CM | POA: Diagnosis present

## 2015-10-07 DIAGNOSIS — Z7982 Long term (current) use of aspirin: Secondary | ICD-10-CM

## 2015-10-07 DIAGNOSIS — R300 Dysuria: Secondary | ICD-10-CM | POA: Diagnosis present

## 2015-10-07 DIAGNOSIS — Z880 Allergy status to penicillin: Secondary | ICD-10-CM

## 2015-10-07 DIAGNOSIS — J9621 Acute and chronic respiratory failure with hypoxia: Secondary | ICD-10-CM | POA: Diagnosis present

## 2015-10-07 DIAGNOSIS — Z87891 Personal history of nicotine dependence: Secondary | ICD-10-CM

## 2015-10-07 DIAGNOSIS — I2510 Atherosclerotic heart disease of native coronary artery without angina pectoris: Secondary | ICD-10-CM | POA: Diagnosis present

## 2015-10-07 LAB — CBC WITH DIFFERENTIAL/PLATELET
Basophils Absolute: 0.1 10*3/uL (ref 0–0.1)
Basophils Relative: 2 %
Eosinophils Absolute: 0.9 10*3/uL — ABNORMAL HIGH (ref 0–0.7)
Eosinophils Relative: 10 %
HCT: 38.2 % — ABNORMAL LOW (ref 40.0–52.0)
Hemoglobin: 13.3 g/dL (ref 13.0–18.0)
Lymphocytes Relative: 22 %
Lymphs Abs: 1.9 10*3/uL (ref 1.0–3.6)
MCH: 33.7 pg (ref 26.0–34.0)
MCHC: 34.9 g/dL (ref 32.0–36.0)
MCV: 96.5 fL (ref 80.0–100.0)
Monocytes Absolute: 0.7 10*3/uL (ref 0.2–1.0)
Monocytes Relative: 8 %
Neutro Abs: 5 10*3/uL (ref 1.4–6.5)
Neutrophils Relative %: 58 %
Platelets: 206 10*3/uL (ref 150–440)
RBC: 3.96 MIL/uL — ABNORMAL LOW (ref 4.40–5.90)
RDW: 13.1 % (ref 11.5–14.5)
WBC: 8.5 10*3/uL (ref 3.8–10.6)

## 2015-10-07 LAB — URINALYSIS COMPLETE WITH MICROSCOPIC (ARMC ONLY)
BILIRUBIN URINE: NEGATIVE
GLUCOSE, UA: NEGATIVE mg/dL
Hgb urine dipstick: NEGATIVE
KETONES UR: NEGATIVE mg/dL
Leukocytes, UA: NEGATIVE
Nitrite: NEGATIVE
PH: 7 (ref 5.0–8.0)
PROTEIN: NEGATIVE mg/dL
Specific Gravity, Urine: 1.01 (ref 1.005–1.030)

## 2015-10-07 LAB — BASIC METABOLIC PANEL
Anion gap: 8 (ref 5–15)
BUN: 27 mg/dL — AB (ref 6–20)
CO2: 25 mmol/L (ref 22–32)
CREATININE: 1.26 mg/dL — AB (ref 0.61–1.24)
Calcium: 9.2 mg/dL (ref 8.9–10.3)
Chloride: 91 mmol/L — ABNORMAL LOW (ref 101–111)
GFR, EST AFRICAN AMERICAN: 52 mL/min — AB (ref >60)
GFR, EST NON AFRICAN AMERICAN: 45 mL/min — AB (ref >60)
Glucose, Bld: 106 mg/dL — ABNORMAL HIGH (ref 65–99)
Potassium: 4.2 mmol/L (ref 3.5–5.1)
SODIUM: 124 mmol/L — AB (ref 135–145)

## 2015-10-07 LAB — BLOOD GAS, VENOUS
Acid-Base Excess: 2.9 mmol/L (ref 0.0–3.0)
Bicarbonate: 26.1 mEq/L (ref 21.0–28.0)
O2 SAT: 80.5 %
PATIENT TEMPERATURE: 37
pCO2, Ven: 35 mmHg — ABNORMAL LOW (ref 44.0–60.0)
pH, Ven: 7.48 — ABNORMAL HIGH (ref 7.320–7.430)
pO2, Ven: 41 mmHg (ref 31.0–45.0)

## 2015-10-07 LAB — TSH: TSH: 4.022 u[IU]/mL (ref 0.350–4.500)

## 2015-10-07 LAB — TROPONIN I

## 2015-10-07 LAB — BRAIN NATRIURETIC PEPTIDE: B NATRIURETIC PEPTIDE 5: 60 pg/mL (ref 0.0–100.0)

## 2015-10-07 MED ORDER — BENZONATATE 100 MG PO CAPS: 200.0000 mg | ORAL_CAPSULE | Freq: Three times a day (TID) | ORAL | Status: DC | PRN

## 2015-10-07 MED ORDER — NITROGLYCERIN 0.4 MG SL SUBL: 0.4000 mg | SUBLINGUAL_TABLET | SUBLINGUAL | Status: DC | PRN

## 2015-10-07 MED ORDER — LEVOFLOXACIN IN D5W 500 MG/100ML IV SOLN
500.0000 mg | Freq: Once | INTRAVENOUS | Status: DC
  Filled 2015-10-07: qty 100

## 2015-10-07 MED ORDER — SODIUM CHLORIDE 0.9% FLUSH: 3.0000 mL | INTRAVENOUS | Status: DC | PRN

## 2015-10-07 MED ORDER — MELATONIN 5 MG PO CAPS
10.0000 mg | ORAL_CAPSULE | Freq: Every day | ORAL | Status: DC
  Filled 2015-10-07: qty 2

## 2015-10-07 MED ORDER — LEVOFLOXACIN IN D5W 500 MG/100ML IV SOLN
500.0000 mg | Freq: Once | INTRAVENOUS | Status: AC
  Administered 2015-10-07: 500 mg via INTRAVENOUS
  Filled 2015-10-07: qty 100

## 2015-10-07 MED ORDER — FLUTICASONE FUROATE-VILANTEROL 100-25 MCG/INH IN AEPB
1.0000 | INHALATION_SPRAY | Freq: Every day | RESPIRATORY_TRACT | Status: DC
  Administered 2015-10-07 – 2015-10-09 (×3): 1 via RESPIRATORY_TRACT
  Filled 2015-10-07: qty 28

## 2015-10-07 MED ORDER — METOPROLOL TARTRATE 25 MG PO TABS
25.0000 mg | ORAL_TABLET | Freq: Two times a day (BID) | ORAL | Status: DC
  Administered 2015-10-07 – 2015-10-09 (×5): 25 mg via ORAL
  Filled 2015-10-07 (×5): qty 1

## 2015-10-07 MED ORDER — METHYLPREDNISOLONE SODIUM SUCC 125 MG IJ SOLR
60.0000 mg | Freq: Three times a day (TID) | INTRAMUSCULAR | Status: DC
  Administered 2015-10-07 – 2015-10-09 (×6): 60 mg via INTRAVENOUS
  Filled 2015-10-07 (×6): qty 2

## 2015-10-07 MED ORDER — PRAVASTATIN SODIUM 20 MG PO TABS
80.0000 mg | ORAL_TABLET | Freq: Every day | ORAL | Status: DC
  Administered 2015-10-07 – 2015-10-09 (×3): 80 mg via ORAL
  Filled 2015-10-07 (×3): qty 4

## 2015-10-07 MED ORDER — ONDANSETRON HCL 4 MG PO TABS: 4.0000 mg | ORAL_TABLET | Freq: Four times a day (QID) | ORAL | Status: DC | PRN

## 2015-10-07 MED ORDER — SODIUM CHLORIDE 0.9% FLUSH
3.0000 mL | Freq: Two times a day (BID) | INTRAVENOUS | Status: DC
  Administered 2015-10-07 – 2015-10-09 (×4): 3 mL via INTRAVENOUS

## 2015-10-07 MED ORDER — METHYLPREDNISOLONE SODIUM SUCC 125 MG IJ SOLR
125.0000 mg | Freq: Once | INTRAMUSCULAR | Status: AC
  Administered 2015-10-07: 125 mg via INTRAVENOUS
  Filled 2015-10-07: qty 2

## 2015-10-07 MED ORDER — ACETAMINOPHEN 500 MG PO TABS
500.0000 mg | ORAL_TABLET | Freq: Every day | ORAL | Status: DC | PRN
  Administered 2015-10-07 (×2): 500 mg via ORAL
  Filled 2015-10-07 (×2): qty 1

## 2015-10-07 MED ORDER — FUROSEMIDE 20 MG PO TABS
20.0000 mg | ORAL_TABLET | Freq: Every day | ORAL | Status: DC
  Administered 2015-10-07 – 2015-10-09 (×3): 20 mg via ORAL
  Filled 2015-10-07 (×3): qty 1

## 2015-10-07 MED ORDER — METHYLPREDNISOLONE SODIUM SUCC 125 MG IJ SOLR: 60.0000 mg | Freq: Four times a day (QID) | INTRAMUSCULAR | Status: DC

## 2015-10-07 MED ORDER — IPRATROPIUM-ALBUTEROL 0.5-2.5 (3) MG/3ML IN SOLN
3.0000 mL | Freq: Four times a day (QID) | RESPIRATORY_TRACT | Status: DC
  Administered 2015-10-07 – 2015-10-09 (×8): 3 mL via RESPIRATORY_TRACT
  Filled 2015-10-07 (×9): qty 3

## 2015-10-07 MED ORDER — ENOXAPARIN SODIUM 30 MG/0.3ML ~~LOC~~ SOLN
30.0000 mg | SUBCUTANEOUS | Status: DC
  Administered 2015-10-07 – 2015-10-09 (×3): 30 mg via SUBCUTANEOUS
  Filled 2015-10-07 (×3): qty 0.3

## 2015-10-07 MED ORDER — ONDANSETRON HCL 4 MG/2ML IJ SOLN: 4.0000 mg | Freq: Four times a day (QID) | INTRAMUSCULAR | Status: DC | PRN

## 2015-10-07 MED ORDER — VITAMIN D 1000 UNITS PO TABS
1000.0000 [IU] | ORAL_TABLET | Freq: Every day | ORAL | Status: DC
  Administered 2015-10-07 – 2015-10-09 (×3): 1000 [IU] via ORAL
  Filled 2015-10-07 (×3): qty 1

## 2015-10-07 MED ORDER — IPRATROPIUM-ALBUTEROL 0.5-2.5 (3) MG/3ML IN SOLN
3.0000 mL | Freq: Once | RESPIRATORY_TRACT | Status: AC
  Administered 2015-10-07: 3 mL via RESPIRATORY_TRACT
  Filled 2015-10-07: qty 3

## 2015-10-07 MED ORDER — LISINOPRIL 5 MG PO TABS
5.0000 mg | ORAL_TABLET | Freq: Every day | ORAL | Status: DC
  Administered 2015-10-07 – 2015-10-09 (×3): 5 mg via ORAL
  Filled 2015-10-07 (×3): qty 1

## 2015-10-07 MED ORDER — SENNOSIDES-DOCUSATE SODIUM 8.6-50 MG PO TABS: 1.0000 | ORAL_TABLET | Freq: Every evening | ORAL | Status: DC | PRN

## 2015-10-07 MED ORDER — SODIUM CHLORIDE 0.9 % IV SOLN
INTRAVENOUS | Status: AC
  Administered 2015-10-07: 15:00:00 via INTRAVENOUS

## 2015-10-07 MED ORDER — LORATADINE 10 MG PO TABS
10.0000 mg | ORAL_TABLET | Freq: Every day | ORAL | Status: DC
  Administered 2015-10-07 – 2015-10-09 (×3): 10 mg via ORAL
  Filled 2015-10-07 (×3): qty 1

## 2015-10-07 MED ORDER — MELATONIN 5 MG PO TABS
10.0000 mg | ORAL_TABLET | Freq: Every day | ORAL | Status: DC
  Administered 2015-10-07 – 2015-10-08 (×2): 10 mg via ORAL
  Filled 2015-10-07 (×3): qty 2

## 2015-10-07 MED ORDER — ASPIRIN EC 81 MG PO TBEC
81.0000 mg | DELAYED_RELEASE_TABLET | Freq: Every day | ORAL | Status: DC
  Administered 2015-10-07 – 2015-10-09 (×3): 81 mg via ORAL
  Filled 2015-10-07 (×3): qty 1

## 2015-10-07 MED ORDER — AMLODIPINE BESYLATE 5 MG PO TABS
5.0000 mg | ORAL_TABLET | Freq: Every day | ORAL | Status: DC
  Administered 2015-10-07 – 2015-10-09 (×3): 5 mg via ORAL
  Filled 2015-10-07 (×3): qty 1

## 2015-10-07 MED ORDER — SODIUM CHLORIDE 0.9 % IV SOLN: 250.0000 mL | INTRAVENOUS | Status: DC | PRN

## 2015-10-07 MED ORDER — LEVOFLOXACIN IN D5W 250 MG/50ML IV SOLN
250.0000 mg | INTRAVENOUS | Status: DC
  Administered 2015-10-08 – 2015-10-09 (×2): 250 mg via INTRAVENOUS
  Filled 2015-10-07 (×2): qty 50

## 2015-10-07 MED ORDER — TAMSULOSIN HCL 0.4 MG PO CAPS
0.4000 mg | ORAL_CAPSULE | Freq: Every day | ORAL | Status: DC
  Administered 2015-10-07 – 2015-10-09 (×3): 0.4 mg via ORAL
  Filled 2015-10-07 (×3): qty 1

## 2015-10-07 NOTE — Progress Notes (Signed)
Patient complains of urinary pain. Hx of urinary retention. Bladder scan> 865 ml. Dr. Sheliah HatchSainnani notified placing new orders.

## 2015-10-07 NOTE — ED Notes (Signed)
Sarajane Fambrough RN helped the Pt to use the bedside commode.

## 2015-10-07 NOTE — ED Notes (Signed)
Pt went to x-Ray.   

## 2015-10-07 NOTE — Progress Notes (Signed)
Speech Therapy Note: MD contacted SLP and asked if SLP would talk w/ pt re: his diet consistency d/t issue w/ meats. Pt acknowledged the meats were "tough" and he agreed he would like them cut w/ gravy added. Diet modified for this to be done and general aspiration precautions posted in chart for NSG. Pt denied need for any further services. MD to consult ST services for evaluation if needed. NSG updated.

## 2015-10-07 NOTE — H&P (Signed)
[facility] [place] [facility]   PATIENT NAME: Daniel Dickson    MR#:  [id number]  DATE OF BIRTH:  05/14/1913  DATE OF ADMISSION:  10/07/2015  PRIMARY CARE PHYSICIAN: BLISS, Doreene Nest, MD   REQUESTING/REFERRING PHYSICIAN:   CHIEF COMPLAINT:   Chief Complaint  Patient presents with  . Shortness of Breath    HISTORY OF PRESENT ILLNESS: Daniel Dickson  is a 82 y.o. male with a known history of Hypertension, posttraumatic stress disorder, hyperlipidemia, coronary artery disease, COPD presented to the emergency room with increased shortness of breath and cough for the last few days. Patient has cough is productive of grayish phlegm. Patient to be short of breath for the last few days and is currently on home oxygen. He has history of COPD but quit smoking 45 years ago. Patient uses stroller to walk around. No history of chest pain. No history of any paroxysmal nocturnal dyspnea or orthopnea. No history of fever or chills or cough. No history of headache dizziness blurry vision syncope or seizure. Patient was evaluated in the emergency room was found to be in COPD observation.  PAST MEDICAL HISTORY:   Past Medical History  Diagnosis Date  . Hypertensive heart disease   . PTSD (post-traumatic stress disorder)     with headaches  . Mixed hyperlipidemia   . Coronary artery disease     a. 2012 s/p MI/cardiac arrest--> PCI mRCA.  Marland Kitchen COPD (chronic obstructive pulmonary disease) (HCC)   . Anxiety     PAST SURGICAL HISTORY: Past Surgical History  Procedure Laterality Date  . Hemorrhoid surgery    . Cholecystectomy    . Cataract extraction    . Vascular surgery      stent R femoral artery  . Coronary angioplasty      with stent; [facility]  . Hip surgery    . Hand surgery      SOCIAL HISTORY:  Social History  Substance Use Topics  . Smoking status: Former Smoker -- 3.00 packs/day for 40 years    Types: Cigarettes  . Smokeless tobacco: Not on file  . Alcohol Use:  Yes     Comment: wine at night.     FAMILY HISTORY:  Family History  Problem Relation Age of Onset  . COPD Father     DRUG ALLERGIES:  Allergies  Allergen Reactions  . Cortisone Other (See Comments)    Unknown reaction  . Dye Fdc Red [Red Dye] Other (See Comments)    Unknown reaction.  . Peanut Oil     Other reaction(s): Unknown  . Peanut-Containing Drug Products     Other reaction(s): Unknown  . Penicillins     patient not sure of reaction    REVIEW OF SYSTEMS:   CONSTITUTIONAL: No fever, has weakness.  EYES: No blurred or double vision.  EARS, NOSE, AND THROAT: No tinnitus or ear pain.  RESPIRATORY: Has cough, shortness of breath, has wheezing, no hemoptysis.  CARDIOVASCULAR: No chest pain, orthopnea, edema.  GASTROINTESTINAL: No nausea, vomiting, diarrhea or abdominal pain.  GENITOURINARY: No dysuria, hematuria.  ENDOCRINE: No polyuria, nocturia,  HEMATOLOGY: No anemia, easy bruising or bleeding SKIN: No rash or lesion. MUSCULOSKELETAL: No joint pain or arthritis.   NEUROLOGIC: No tingling, numbness, weakness.  PSYCHIATRY: No anxiety or depression.   MEDICATIONS AT HOME:  Prior to Admission medications   Medication Sig Start Date End Date Taking? Authorizing Provider  acetaminophen (TYLENOL) 500 MG tablet Take 500 mg by mouth daily  as needed for mild pain or moderate pain.    Yes Historical Provider, MD  albuterol (PROAIR HFA) 108 (90 BASE) MCG/ACT inhaler Inhale 2 puffs into the lungs every 6 (six) hours as needed. For wheezing.   Yes Historical Provider, MD  albuterol (PROVENTIL) (2.5 MG/3ML) 0.083% nebulizer solution Take 2.5 mg by nebulization every 6 (six) hours as needed for wheezing or shortness of breath.   Yes Historical Provider, MD  amLODipine (NORVASC) 5 MG tablet Take 1 tablet (5 mg total) by mouth daily. 09/03/14  Yes Antonieta Ibaimothy J Gollan, MD  aspirin EC 81 MG tablet Take 81 mg by mouth daily.   Yes Historical Provider, MD  benzonatate (TESSALON) 200 MG  capsule Take 200 mg by mouth 3 (three) times daily as needed for cough.   Yes Historical Provider, MD  Cholecalciferol (VITAMIN D3) 1000 UNITS CAPS Take 1,000 Units by mouth daily.   Yes Historical Provider, MD  fluticasone furoate-vilanterol (BREO ELLIPTA) 100-25 MCG/INH AEPB Inhale 1 puff into the lungs daily.   Yes Historical Provider, MD  FUROSEMIDE PO Take 20 mg by mouth daily.    Yes Historical Provider, MD  lisinopril (PRINIVIL,ZESTRIL) 5 MG tablet Take 5 mg by mouth daily.   Yes Historical Provider, MD  loratadine (CLARITIN) 10 MG tablet Take 10 mg by mouth daily.   Yes Historical Provider, MD  Melatonin 5 MG CAPS Take 10 mg by mouth at bedtime.   Yes Historical Provider, MD  metoprolol tartrate (LOPRESSOR) 25 MG tablet Take 25 mg by mouth 2 (two) times daily.   Yes Historical Provider, MD  nitroGLYCERIN (NITROSTAT) 0.4 MG SL tablet Place 0.4 mg under the tongue every 5 (five) minutes as needed for chest pain. Reported on 06/07/2015   Yes Historical Provider, MD  pravastatin (PRAVACHOL) 80 MG tablet Take 80 mg by mouth daily.   Yes Historical Provider, MD  tamsulosin (FLOMAX) 0.4 MG CAPS capsule Take 0.4 mg by mouth daily. Take 30 minutes after same meal each day.   Yes Historical Provider, MD  tiotropium (SPIRIVA) 18 MCG inhalation capsule Place 1 capsule (18 mcg total) into inhaler and inhale daily. Patient taking differently: Place 18 mcg into inhaler and inhale every evening.  01/12/15 01/12/16 Yes Sital Mody, MD      PHYSICAL EXAMINATION:   VITAL SIGNS: Blood pressure 122/68, pulse 100, temperature 98.4 F (36.9 C), temperature source Oral, resp. rate 20, height 5\' 6"  (1.676 m), weight 65.772 kg (145 lb), SpO2 100 %.  GENERAL:  [age] y.o.-year-old patient lying in the bed with no acute distress.  EYES: Pupils equal, round, reactive to light and accommodation. No scleral icterus. Extraocular muscles intact.  HEENT: Head atraumatic, normocephalic. Oropharynx and nasopharynx clear.   NECK:  Supple, no jugular venous distention. No thyroid enlargement, no tenderness.  LUNGS: Decreased breath sounds bilaterally, bilateral wheezingnoted, rales,bilateral rhonchi noted. No use of accessory muscles of respiration.  CARDIOVASCULAR: S1, S2 normal. No murmurs, rubs, or gallops.  ABDOMEN: Soft, nontender, nondistended. Bowel sounds present. No organomegaly or mass.  EXTREMITIES: mild pedal edema noted, no cyanosis, or clubbing.  NEUROLOGIC: Cranial nerves II through XII are intact. Muscle strength 5/5 in all extremities. Sensation intact. Gait not checked.  PSYCHIATRIC: The patient is alert and oriented x 3.  SKIN: No obvious rash, lesion, or ulcer.   LABORATORY PANEL:   CBC  Recent Labs Lab 10/07/15 0245  WBC 8.5  HGB 13.3  HCT 38.2*  PLT 206  MCV 96.5  MCH 33.7  MCHC  34.9  RDW 13.1  LYMPHSABS 1.9  MONOABS 0.7  EOSABS 0.9*  BASOSABS 0.1   ------------------------------------------------------------------------------------------------------------------  Chemistries   Recent Labs Lab 10/07/15 0245  NA 124*  K 4.2  CL 91*  CO2 25  GLUCOSE 106*  BUN 27*  CREATININE 1.26*  CALCIUM 9.2   ------------------------------------------------------------------------------------------------------------------ estimated creatinine clearance is 28.1 mL/min (by C-G formula based on Cr of 1.26). ------------------------------------------------------------------------------------------------------------------ No results for input(s): TSH, T4TOTAL, T3FREE, THYROIDAB in the last 72 hours.  Invalid input(s): FREET3   Coagulation profile No results for input(s): INR, PROTIME in the last 168 hours. ------------------------------------------------------------------------------------------------------------------- No results for input(s): DDIMER in the last 72  hours. -------------------------------------------------------------------------------------------------------------------  Cardiac Enzymes  Recent Labs Lab 10/07/15 0245  TROPONINI <0.03   ------------------------------------------------------------------------------------------------------------------ Invalid input(s): POCBNP  ---------------------------------------------------------------------------------------------------------------  Urinalysis No results found for: COLORURINE, APPEARANCEUR, LABSPEC, PHURINE, GLUCOSEU, HGBUR, BILIRUBINUR, KETONESUR, PROTEINUR, UROBILINOGEN, NITRITE, LEUKOCYTESUR   RADIOLOGY: Dg Chest 2 View  10/07/2015  CLINICAL DATA:  Dyspnea.  Wheezing. EXAM: CHEST  2 VIEW COMPARISON:  06/06/2015 FINDINGS: Marked hyperinflation. Severe emphysematous disease. No confluent consolidation. No effusion. No pneumothorax. Normal pulmonary vasculature. Normal heart size. Unremarkable hilar and mediastinal contours. IMPRESSION: Hyperinflation and severe emphysematous disease. No consolidation. No effusions. Electronically Signed   By: Ellery Plunkaniel R Mitchell M.D.   On: 10/07/2015 03:08    EKG: Orders placed or performed during the hospital encounter of 10/07/15  . ED EKG  . ED EKG  . EKG 12-Lead  . EKG 12-Lead    IMPRESSION AND PLAN: 82 year old male patient with history of emphysema on home oxygen,, hypertension, hyperlipidemia, coronary artery disease presented to the emergency room with shortness of breath and cough. Admitting diagnosis 1. Acute COPD exacerbation 2. Acute dyspnea 3. Hypertension 4. Hyperlipidemia 5. Coronary artery disease Treatment plan Admit patient to medical floor GU nebulizations around the clock IV Solu-Medrol 60 MG every 6 hourly IV Levaquin antibiotic 500 MG once daily Antitussive medication DVT prophylaxis subcutaneous Lovenox  Supportive care.   All the records are reviewed and case discussed with ED provider. Management  plans discussed with the patient, family and they are in agreement.  CODE STATUS:FULL Code Status History    Date Active Date Inactive Code Status Order ID Comments User Context   06/07/2015 12:04 AM 06/10/2015  4:31 PM Full Code [id number]  Oralia Manisavid Willis, MD Inpatient   01/11/2015  1:54 PM 01/12/2015  1:58 PM Full Code [id number]  Alford Highlandichard Wieting, MD ED       TOTAL TIME TAKING CARE OF THIS PATIENT: 50 minutes.    Ihor AustinPavan Ezequias Lard M.D on 10/07/2015 at 5:05 AM  Between 7am to 6pm - Pager - [phone]  After 6pm go to [email or web address] - password [email or web address] [facility]  [facility]  Office  [phone]  CC: Primary care physician; Dortha KernBLISS, LAURA K, MD

## 2015-10-07 NOTE — Progress Notes (Signed)
Pharmacy Antibiotic Note  Daniel Dickson is a [age] y.o. male admitted on 10/07/2015 with COPD excerbation.  Pharmacy has been consulted for Levaquin dosing.  Plan: Levaquin 500 mg IV x1 and tthen 250 mg IV q 24 hours ordered.  Height: 5\' 6"  (167.6 cm) Weight: 145 lb (65.772 kg) IBW/kg (Calculated) : 63.8  Temp (24hrs), Avg:98.4 F (36.9 C), Min:98.4 F (36.9 C), Max:98.4 F (36.9 C)   Recent Labs Lab 10/07/15 0245  WBC 8.5  CREATININE 1.26*    Estimated Creatinine Clearance: 28.1 mL/min (by C-G formula based on Cr of 1.26).    Allergies  Allergen Reactions  . Cortisone Other (See Comments)    Unknown reaction  . Dye Fdc Red [Red Dye] Other (See Comments)    Unknown reaction.  . Peanut Oil     Other reaction(s): Unknown  . Peanut-Containing Drug Products     Other reaction(s): Unknown  . Penicillins     patient not sure of reaction    Antimicrobials this admission: LEvaquin  >>    >>   Dose adjustments this admission:   Microbiology results: No micro  6/28 CXR: no consolidation  Thank you for allowing pharmacy to be a part of this patient's care.  Khylin Gutridge S 10/07/2015 5:20 AM

## 2015-10-07 NOTE — ED Provider Notes (Signed)
[facility] Emergency Department Provider Note  ____________________________________________  Time seen: 2:40 AM  I have reviewed the triage vital signs and the nursing notes.   HISTORY  Chief Complaint Shortness of Breath     HPI Daniel Dickson is a 82 y.o. male with history of COPD presents via EMS with progressive dyspnea times today. Per EMS on their arrival patient with labored breathing and diffuse audible wheezes. Patient received 2 DuoNeb's in route to the hospital with some improvement Hower for continued labored breathing and diffuse wheezes on arrival. Patient denies any fever however does admit to increased coughing     Past Medical History  Diagnosis Date  . Hypertensive heart disease   . PTSD (post-traumatic stress disorder)     with headaches  . Mixed hyperlipidemia   . Coronary artery disease     a. 2012 s/p MI/cardiac arrest--> PCI mRCA.  Marland Kitchen COPD (chronic obstructive pulmonary disease) (HCC)   . Anxiety     Patient Active Problem List   Diagnosis Date Noted  . Dyspnea 10/07/2015  . Hyponatremia 06/06/2015  . Coronary artery disease   . Hypertensive heart disease   . Mixed hyperlipidemia   . COPD (chronic obstructive pulmonary disease) (HCC)   . COPD exacerbation (HCC) 01/11/2015  . Fall 09/03/2014  . Hip fracture (HCC) 09/03/2014  . PTSD (post-traumatic stress disorder) 09/03/2014  . Bilateral leg edema 09/03/2014  . SOB (shortness of breath) 10/31/2012  . Essential hypertension 10/31/2012  . CAD (coronary artery disease) 10/31/2012  . Hyperlipidemia 10/31/2012  . Tachycardia 10/31/2012    Past Surgical History  Procedure Laterality Date  . Hemorrhoid surgery    . Cholecystectomy    . Cataract extraction    . Vascular surgery      stent R femoral artery  . Coronary angioplasty      with stent; [facility]  . Hip surgery    . Hand surgery      Current Outpatient Rx  Name  Route  Sig  Dispense  Refill  .  acetaminophen (TYLENOL) 500 MG tablet   Oral   Take 500 mg by mouth daily as needed for mild pain or moderate pain.          Marland Kitchen albuterol (PROAIR HFA) 108 (90 BASE) MCG/ACT inhaler   Inhalation   Inhale 2 puffs into the lungs every 6 (six) hours as needed. For wheezing.         Marland Kitchen albuterol (PROVENTIL) (2.5 MG/3ML) 0.083% nebulizer solution   Nebulization   Take 2.5 mg by nebulization every 6 (six) hours as needed for wheezing or shortness of breath.         Marland Kitchen amLODipine (NORVASC) 5 MG tablet   Oral   Take 1 tablet (5 mg total) by mouth daily.   90 tablet   4   . aspirin EC 81 MG tablet   Oral   Take 81 mg by mouth daily.         . benzonatate (TESSALON) 200 MG capsule   Oral   Take 200 mg by mouth 3 (three) times daily as needed for cough.         . Cholecalciferol (VITAMIN D3) 1000 UNITS CAPS   Oral   Take 1,000 Units by mouth daily.         . fluticasone furoate-vilanterol (BREO ELLIPTA) 100-25 MCG/INH AEPB   Inhalation   Inhale 1 puff into the lungs daily.         Marland Kitchen  FUROSEMIDE PO   Oral   Take 20 mg by mouth daily.          Marland Kitchen. lisinopril (PRINIVIL,ZESTRIL) 5 MG tablet   Oral   Take 5 mg by mouth daily.         Marland Kitchen. loratadine (CLARITIN) 10 MG tablet   Oral   Take 10 mg by mouth daily.         . Melatonin 5 MG CAPS   Oral   Take 10 mg by mouth at bedtime.         . metoprolol tartrate (LOPRESSOR) 25 MG tablet   Oral   Take 25 mg by mouth 2 (two) times daily.         . nitroGLYCERIN (NITROSTAT) 0.4 MG SL tablet   Sublingual   Place 0.4 mg under the tongue every 5 (five) minutes as needed for chest pain. Reported on 06/07/2015         . pravastatin (PRAVACHOL) 80 MG tablet   Oral   Take 80 mg by mouth daily.         . tamsulosin (FLOMAX) 0.4 MG CAPS capsule   Oral   Take 0.4 mg by mouth daily. Take 30 minutes after same meal each day.         . tiotropium (SPIRIVA) 18 MCG inhalation capsule   Inhalation   Place 1 capsule (18  mcg total) into inhaler and inhale daily. Patient taking differently: Place 18 mcg into inhaler and inhale every evening.    30 capsule   12     Allergies Cortisone; Dye fdc red; Peanut oil; Peanut-containing drug products; and Penicillins  Family History  Problem Relation Age of Onset  . COPD Father     Social History Social History  Substance Use Topics  . Smoking status: Former Smoker -- 3.00 packs/day for 40 years    Types: Cigarettes  . Smokeless tobacco: None  . Alcohol Use: Yes     Comment: wine at night.     Review of Systems  Constitutional: Negative for fever. Eyes: Negative for visual changes. ENT: Negative for sore throat. Cardiovascular: Negative for chest pain. Respiratory: Positive for cough and dyspnea Gastrointestinal: Negative for abdominal pain, vomiting and diarrhea. Genitourinary: Negative for dysuria. Musculoskeletal: Negative for back pain. Skin: Negative for rash. Neurological: Negative for headaches, focal weakness or numbness.   10-point ROS otherwise negative.  ____________________________________________   PHYSICAL EXAM:  VITAL SIGNS: ED Triage Vitals  Enc Vitals Group     BP 10/07/15 0243 122/68 mmHg     Pulse Rate 10/07/15 0243 100     Resp 10/07/15 0243 20     Temp 10/07/15 0243 98.4 F (36.9 C)     Temp Source 10/07/15 0243 Oral     SpO2 10/07/15 0243 100 %     Weight 10/07/15 0243 145 lb (65.772 kg)     Height 10/07/15 0243 5\' 6"  (1.676 m)     Head Cir --      Peak Flow --      Pain Score 10/07/15 0244 0     Pain Loc --      Pain Edu? --      Excl. in GC? --      Constitutional: Alert and oriented. Apparent respiratory distress Eyes: Conjunctivae are normal. PERRL. Normal extraocular movements. ENT   Head: Normocephalic and atraumatic.   Nose: No congestion/rhinnorhea.   Mouth/Throat: Mucous membranes are moist.   Neck: No stridor. Hematological/Lymphatic/Immunilogical: No cervical  lymphadenopathy. Cardiovascular: Normal rate, regular rhythm. Normal and symmetric distal pulses are present in all extremities. No murmurs, rubs, or gallops. Respiratory: Normal respiratory effort without tachypnea nor retractions. Breath sounds are clear and equal bilaterally. Diffuse coarse wheezes Gastrointestinal: Soft and nontender. No distention. There is no CVA tenderness. Genitourinary: deferred Musculoskeletal: Nontender with normal range of motion in all extremities. No joint effusions.  No lower extremity tenderness nor edema. Neurologic:  Normal speech and language. No gross focal neurologic deficits are appreciated. Speech is normal.  Skin:  Skin is warm, dry and intact. No rash noted. Psychiatric: Mood and affect are normal. Speech and behavior are normal. Patient exhibits appropriate insight and judgment.  ____________________________________________    LABS (pertinent positives/negatives)  Labs Reviewed  CBC WITH DIFFERENTIAL/PLATELET - Abnormal; Notable for the following:    RBC 3.96 (*)    HCT 38.2 (*)    Eosinophils Absolute 0.9 (*)    All other components within normal limits  BASIC METABOLIC PANEL - Abnormal; Notable for the following:    Sodium 124 (*)    Chloride 91 (*)    Glucose, Bld 106 (*)    BUN 27 (*)    Creatinine, Ser 1.26 (*)    GFR calc non Af Amer 45 (*)    GFR calc Af Amer 52 (*)    All other components within normal limits  BRAIN NATRIURETIC PEPTIDE  TROPONIN I  BLOOD GAS, VENOUS     ____________________________________________   EKG  ED ECG REPORT I, [place] N Mckenlee Mangham, the attending physician, personally viewed and interpreted this ECG.   Date: 10/07/2015  EKG Time: 2:46 AM  Rate: 97  Rhythm: Normal sinus rhythm  Axis: Normal  Intervals: Normal  ST&T Change: None   ____________________________________________    RADIOLOGY    DG Chest 2 View (Final result) Result time: 10/07/15 03:08:30   Final result by Rad Results In  Interface (10/07/15 03:08:30)   Narrative:   CLINICAL DATA: Dyspnea. Wheezing.  EXAM: CHEST 2 VIEW  COMPARISON: 06/06/2015  FINDINGS: Marked hyperinflation. Severe emphysematous disease. No confluent consolidation. No effusion. No pneumothorax. Normal pulmonary vasculature. Normal heart size. Unremarkable hilar and mediastinal contours.  IMPRESSION: Hyperinflation and severe emphysematous disease. No consolidation. No effusions.   Electronically Signed By: Ellery Plunkaniel R Mitchell M.D. On: 10/07/2015 03:08        INITIAL IMPRESSION / ASSESSMENT AND PLAN / ED COURSE  Pertinent labs & imaging results that were available during my care of the patient were reviewed by me and considered in my medical decision making (see chart for details).  Patient received an additional DuoNeb as well as 125 mg of Solu-Medrol with improvement of symptoms however continues to have diffuse coarse wheezing. As such patient discussed with Dr.Pyreddy for hospital admission further evaluation and management  ____________________________________________   FINAL CLINICAL IMPRESSION(S) / ED DIAGNOSES  Final diagnoses:  Chronic obstructive pulmonary disease with acute exacerbation (HCC)      Darci Currentandolph N Etheridge Geil, MD 10/07/15 [phone]

## 2015-10-07 NOTE — ED Notes (Signed)
Pt arrived to the ED via EMS from home for SOB. EMS reports that the Pt had audible wheezing and had labored breathing. EMS gave 2 duo nebs and started a intravenous catheter. Pt is AOx4 in no apparent distress during triage.

## 2015-10-07 NOTE — Care Management Note (Signed)
Case Management Note  Patient Details  Name: Daniel Dickson MRN: [id number] Date of Birth: 05/14/1913 Subjective/Objective:                  Met with patient to discuss discharge planning. He is from home with his son [name] that has Down Syndrome. His daughter [name] and [name] lives across the street from patient and assists them both. Patient is on Chronic O2 through [place]. He is currently followed by [facility] home health through [facility]. Patient ambulates with a rolling walker. He states he has a wheelchair also available in the home. His PCP is Dr. Clemmie Krill of [facility]. [facility] has [facility] in [facility] past.   Action/Plan: I have faxed/notified [facility] of patient admission. I have also notified Santiago Glad with [facility] home health of patient admission. RNCM will continue to follow.   Expected Discharge Date:                  Expected Discharge Plan:     In-House Referral:     Discharge planning Services  CM Consult  Post Acute Care Choice:  Home Health Choice offered to:  Patient  DME Arranged:    DME Agency:     HH Arranged:    [place] Agency:     Status of Service:  In process, will continue to follow  If discussed at Long Length of Stay Meetings, dates discussed:    Additional Comments:  Marshell Garfinkel, RN 10/07/2015, 10:49 AM

## 2015-10-07 NOTE — Progress Notes (Signed)
[employer] [facility] - [place] at [facility]   PATIENT NAME: Daniel BussingGeorge Dickson    MR#:  [id number]  DATE OF BIRTH:  05/14/1913  SUBJECTIVE:  C/o burning on urination and SOB with cough  REVIEW OF SYSTEMS:    Review of Systems  Constitutional: Negative for fever, chills and malaise/fatigue.  HENT: Negative for ear discharge, ear pain, hearing loss, nosebleeds and sore throat.   Eyes: Negative for blurred vision and pain.  Respiratory: Positive for cough, shortness of breath and wheezing. Negative for hemoptysis.   Cardiovascular: Negative for chest pain, palpitations and leg swelling.  Gastrointestinal: Negative for nausea, vomiting, abdominal pain, diarrhea and blood in stool.  Genitourinary: Positive for dysuria.  Musculoskeletal: Negative for back pain.  Neurological: Negative for dizziness, tremors, speech change, focal weakness, seizures and headaches.  Endo/Heme/Allergies: Does not bruise/bleed easily.  Psychiatric/Behavioral: Negative for depression, suicidal ideas and hallucinations.    Tolerating Diet:yes      DRUG ALLERGIES:   Allergies  Allergen Reactions  . Cortisone Other (See Comments)    Unknown reaction  . Dye Fdc Red [Red Dye] Other (See Comments)    Unknown reaction.  . Peanut Oil     Other reaction(s): Unknown  . Peanut-Containing Drug Products     Other reaction(s): Unknown  . Penicillins     patient not sure of reaction    VITALS:  Blood pressure 155/84, pulse 100, temperature 97.9 F (36.6 C), temperature source Oral, resp. rate 18, height 5\' 6"  (1.676 m), weight 65.772 kg (145 lb), SpO2 93 %.  PHYSICAL EXAMINATION:   Physical Exam  Constitutional: He is oriented to person, place, and time and well-developed, well-nourished, and in no distress. No distress.  HENT:  Head: Normocephalic.  Eyes: No scleral icterus.  Neck: Normal range of motion. Neck supple. No JVD present. No tracheal deviation present.  Cardiovascular: Normal rate,  regular rhythm and normal heart sounds.  Exam reveals no gallop and no friction rub.   No murmur heard. Pulmonary/Chest: Effort normal. No respiratory distress. He has wheezes. He has no rales. He exhibits no tenderness.  Good air flow  Abdominal: Soft. Bowel sounds are normal. He exhibits no distension and no mass. There is no tenderness. There is no rebound and no guarding.  Musculoskeletal: Normal range of motion. He exhibits no edema.  Neurological: He is alert and oriented to person, place, and time.  Skin: Skin is warm. No rash noted. No erythema.  Psychiatric: Affect and judgment normal.      LABORATORY PANEL:   CBC  Recent Labs Lab 10/07/15 0245  WBC 8.5  HGB 13.3  HCT 38.2*  PLT 206   ------------------------------------------------------------------------------------------------------------------  Chemistries   Recent Labs Lab 10/07/15 0245  NA 124*  K 4.2  CL 91*  CO2 25  GLUCOSE 106*  BUN 27*  CREATININE 1.26*  CALCIUM 9.2   ------------------------------------------------------------------------------------------------------------------  Cardiac Enzymes  Recent Labs Lab 10/07/15 0245  TROPONINI <0.03   ------------------------------------------------------------------------------------------------------------------  RADIOLOGY:  Dg Chest 2 View  10/07/2015  CLINICAL DATA:  Dyspnea.  Wheezing. EXAM: CHEST  2 VIEW COMPARISON:  06/06/2015 FINDINGS: Marked hyperinflation. Severe emphysematous disease. No confluent consolidation. No effusion. No pneumothorax. Normal pulmonary vasculature. Normal heart size. Unremarkable hilar and mediastinal contours. IMPRESSION: Hyperinflation and severe emphysematous disease. No consolidation. No effusions. Electronically Signed   By: Ellery Plunkaniel R Mitchell M.D.   On: 10/07/2015 03:08     ASSESSMENT AND PLAN:   [age] y/o male here with COPD exacerbation.  1. acuteOn  chronic hypoxic respiratory failure: This is due to  COPD exacerbation. She is on 1 L of oxygen at home.  2. Acute COPD exacerbation: Patient continues to have wheezing with good airflow. Wean IV steroids and continue duo nebs and inhalers.  3. Dysuria: Urine analysis is negative for UTI.   4. Essential hypertension: Continue Ctr., Pell, Norvasc and metoprolol. 5. BPH: Continue Flomax. 6. Hyponatremia: Continue IV fluids and repeat in a.m.   Management plans discussed with the patient and he is in agreement.  CODE STATUS: FULL  TOTAL TIME TAKING CARE OF THIS PATIENT: 30 minutes.     POSSIBLE D/C 1-2 days, DEPENDING ON CLINICAL CONDITION.   Aluel Schwarz M.D on 10/07/2015 at 12:57 PM  Between 7am to 6pm - Pager - [phone] After 6pm go to [email or web address] - password [employer] [email or web address]  [facility] [place] [facility]  Office  [phone]  CC: Primary care physician; Dortha KernBLISS, LAURA K, MD  Note: This dictation was prepared with Dragon dictation along with smaller phrase technology. Any transcriptional errors that result from this process are unintentional.

## 2015-10-07 NOTE — Progress Notes (Signed)
PT Cancellation Note  Patient Details Name: Daniel Dickson MRN: [id number] DOB: 05/14/1913   Cancelled Treatment:    Reason Eval/Treat Not Completed: Patient declined, no reason specified. Patient reports he knows himself and is not up for any mobility currently. He is more interested in finding out if he has a UTI. PT will re-attempt at a later time/date.   Kerin RansomPatrick A Diani Jillson, PT, DPT    10/07/2015, 10:47 AM

## 2015-10-07 NOTE — Progress Notes (Signed)
Anticoagulation monitoring(Lovenox):  [age] yo M  ordered Lovenox 40 mg Q24h  Filed Weights   10/07/15 0243  Weight: 145 lb (65.772 kg)   BMI 23.5  Lab Results  Component Value Date   CREATININE 1.26* 10/07/2015   CREATININE 1.07 06/10/2015   CREATININE 1.20 06/09/2015   Estimated Creatinine Clearance: 28.1 mL/min (by C-G formula based on Cr of 1.26). Hemoglobin & Hematocrit     Component Value Date/Time   HGB 13.3 10/07/2015 0245   HGB 15.6 02/06/2014 0153   HCT 38.2* 10/07/2015 0245   HCT 47.3 02/06/2014 0153     Per Protocol for Patient with estCrcl< 30 ml/min and BMI < 40, will transition to Lovenox 30 mg Q24h.     Luisa HartScott Brinlynn Gorton, PharmD Clinical Pharmacist

## 2015-10-08 LAB — CBC
HEMATOCRIT: 35.9 % — AB (ref 40.0–52.0)
HEMOGLOBIN: 12.6 g/dL — AB (ref 13.0–18.0)
MCH: 33.7 pg (ref 26.0–34.0)
MCHC: 35.1 g/dL (ref 32.0–36.0)
MCV: 96 fL (ref 80.0–100.0)
PLATELETS: 191 10*3/uL (ref 150–440)
RBC: 3.74 MIL/uL — AB (ref 4.40–5.90)
RDW: 13.1 % (ref 11.5–14.5)
WBC: 5.4 10*3/uL (ref 3.8–10.6)

## 2015-10-08 LAB — BASIC METABOLIC PANEL
ANION GAP: 7 (ref 5–15)
BUN: 26 mg/dL — ABNORMAL HIGH (ref 6–20)
CHLORIDE: 96 mmol/L — AB (ref 101–111)
CO2: 24 mmol/L (ref 22–32)
CREATININE: 1.1 mg/dL (ref 0.61–1.24)
Calcium: 8.7 mg/dL — ABNORMAL LOW (ref 8.9–10.3)
GFR calc non Af Amer: 53 mL/min — ABNORMAL LOW (ref >60)
Glucose, Bld: 136 mg/dL — ABNORMAL HIGH (ref 65–99)
POTASSIUM: 4.1 mmol/L (ref 3.5–5.1)
SODIUM: 127 mmol/L — AB (ref 135–145)

## 2015-10-08 LAB — URINE CULTURE: SPECIAL REQUESTS: NORMAL

## 2015-10-08 MED ORDER — FINASTERIDE 5 MG PO TABS
5.0000 mg | ORAL_TABLET | Freq: Every day | ORAL | Status: DC
  Administered 2015-10-08 – 2015-10-09 (×2): 5 mg via ORAL
  Filled 2015-10-08 (×2): qty 1

## 2015-10-08 MED ORDER — MAGNESIUM HYDROXIDE 400 MG/5ML PO SUSP
30.0000 mL | Freq: Two times a day (BID) | ORAL | Status: AC
  Administered 2015-10-08 (×2): 30 mL via ORAL
  Filled 2015-10-08 (×2): qty 30

## 2015-10-08 MED ORDER — POLYETHYLENE GLYCOL 3350 17 G PO PACK
17.0000 g | PACK | Freq: Every day | ORAL | Status: DC
  Administered 2015-10-08 – 2015-10-09 (×2): 17 g via ORAL
  Filled 2015-10-08 (×2): qty 1

## 2015-10-08 MED ORDER — SODIUM CHLORIDE 0.9 % IV SOLN: INTRAVENOUS | Status: DC

## 2015-10-08 NOTE — Progress Notes (Signed)
[employer] - [place] at [facility]   PATIENT NAME: Daniel Dickson    MR#:  [id number]  DATE OF BIRTH:  05/14/1913  SUBJECTIVE:   Patient with greater than 500 of urine in bladder on today's scan. Still with some wheezing however improved since admission  REVIEW OF SYSTEMS:    Review of Systems  Constitutional: Negative for fever, chills and malaise/fatigue.  HENT: Negative for ear discharge, ear pain, hearing loss, nosebleeds and sore throat.   Eyes: Negative for blurred vision and pain.  Respiratory: Positive for cough and wheezing. Negative for hemoptysis.   Cardiovascular: Negative for chest pain, palpitations and leg swelling.  Gastrointestinal: Negative for nausea, vomiting, abdominal pain, diarrhea and blood in stool.  Genitourinary:       Hesitancy, fullness of bladder  Musculoskeletal: Negative for back pain.  Neurological: Negative for dizziness, tremors, speech change, focal weakness, seizures and headaches.  Endo/Heme/Allergies: Does not bruise/bleed easily.  Psychiatric/Behavioral: Negative for depression, suicidal ideas and hallucinations.    Tolerating Diet:yes      DRUG ALLERGIES:   Allergies  Allergen Reactions  . Cortisone Other (See Comments)    Unknown reaction  . Dye Fdc Red [Red Dye] Other (See Comments)    Unknown reaction.  . Peanut Oil     Other reaction(s): Unknown  . Peanut-Containing Drug Products     Other reaction(s): Unknown  . Penicillins     patient not sure of reaction    VITALS:  Blood pressure 121/62, pulse 92, temperature 97.8 F (36.6 C), temperature source Oral, resp. rate 18, height 5\' 6"  (1.676 m), weight 65.772 kg (145 lb), SpO2 95 %.  PHYSICAL EXAMINATION:   Physical Exam  Constitutional: He is oriented to person, place, and time and well-developed, well-nourished, and in no distress. No distress.  HENT:  Head: Normocephalic.  Eyes: No scleral icterus.  Neck: Normal range of motion. Neck supple. No  JVD present. No tracheal deviation present.  Cardiovascular: Normal rate, regular rhythm and normal heart sounds.  Exam reveals no gallop and no friction rub.   No murmur heard. Pulmonary/Chest: Effort normal. No respiratory distress. He has wheezes. He has no rales. He exhibits no tenderness.  Good air flow  Abdominal: Soft. Bowel sounds are normal. He exhibits no distension and no mass. There is no tenderness. There is no rebound and no guarding.  Musculoskeletal: Normal range of motion. He exhibits no edema.  Neurological: He is alert and oriented to person, place, and time.  Skin: Skin is warm. No rash noted. No erythema.  Psychiatric: Affect and judgment normal.      LABORATORY PANEL:   CBC  Recent Labs Lab 10/08/15 0319  WBC 5.4  HGB 12.6*  HCT 35.9*  PLT 191   ------------------------------------------------------------------------------------------------------------------  Chemistries   Recent Labs Lab 10/08/15 0319  NA 127*  K 4.1  CL 96*  CO2 24  GLUCOSE 136*  BUN 26*  CREATININE 1.10  CALCIUM 8.7*   ------------------------------------------------------------------------------------------------------------------  Cardiac Enzymes  Recent Labs Lab 10/07/15 0245  TROPONINI <0.03   ------------------------------------------------------------------------------------------------------------------  RADIOLOGY:  Dg Chest 2 View  10/07/2015  CLINICAL DATA:  Dyspnea.  Wheezing. EXAM: CHEST  2 VIEW COMPARISON:  06/06/2015 FINDINGS: Marked hyperinflation. Severe emphysematous disease. No confluent consolidation. No effusion. No pneumothorax. Normal pulmonary vasculature. Normal heart size. Unremarkable hilar and mediastinal contours. IMPRESSION: Hyperinflation and severe emphysematous disease. No consolidation. No effusions. Electronically Signed   By: Ellery Plunkaniel R Mitchell M.D.   On: 10/07/2015 03:08  ASSESSMENT AND PLAN:   [age] y/o male here with COPD  exacerbation.  1. Acute on chronic hypoxic respiratory failure: This is due to COPD exacerbation. He is on 1 L of oxygen at home.  2. Acute COPD exacerbation: Showing marked improvement since yesterday Wean IV steroids and continue duo nebs and inhalers.  3. Essential hypertension: Continue Ctr., Pell, Norvasc and metoprolol. 4. BPH: Despite being on Flomax patient continues to show signs of enlarged prostate.  Discussed with urology.  Plan for Foley and add finasteride.  Voiding trial in 1 week and will follow up with urology as outpatient.  Continue Flomax as well. 6. Hyponatremia: With some improvement . Continue IV fluids and repeat in a.m.   Management plans discussed with the patient and he is in agreement.  CODE STATUS: FULL  TOTAL TIME TAKING CARE OF THIS PATIENT: 24 minutes.   Discussed with family at bedside. Discussed with Dr. Mena GoesEskridge.  POSSIBLE D/C 1-2 days, DEPENDING ON CLINICAL CONDITION.   Harsh Trulock M.D on 10/08/2015 at 10:44 AM  Between 7am to 6pm - Pager - [phone] After 6pm go to [email or web address] - password [email or web address]  [facility] [place] [facility]  Office  [phone]  CC: Primary care physician; Dortha KernBLISS, LAURA K, MD  Note: This dictation was prepared with Dragon dictation along with smaller phrase technology. Any transcriptional errors that result from this process are unintentional.

## 2015-10-08 NOTE — Progress Notes (Signed)
PT Cancellation Note  Patient Details Name: Calton GoldsGeorge Henry Henrickson MRN: [id number] DOB: 05/14/1913   Cancelled Treatment:    Reason Eval/Treat Not Completed: Patient declined. Pt with recent catheter insertion and pt concerned about mobilizing. PT provided copious education regarding the safety of mobilizing with a foley, in addition to the health benefits of OOB activity but pt continues to decline "you can say that stuff all day, but I know my body best". PT offered bed level exercise/evaluation but pt again declines, requesting to be left alone. Will re-attempt PT eval at later date/time.   Omer Monter, SPT 10/08/2015, 11:01 AM

## 2015-10-08 NOTE — Progress Notes (Signed)
Catheter inserted with no complications.  Patient education done and able to repeat back some information but needs reinforcement.

## 2015-10-08 NOTE — Progress Notes (Signed)
Bladder scan shows in bladder after urinating.

## 2015-10-08 NOTE — Care Management (Signed)
Encouraged patient to work with PT. Patient now with Foley. [facility] updated. [facility]

## 2015-10-08 NOTE — Progress Notes (Signed)
Patient is alert and oriented, vss, no complaints of pain.  Sat in chair for dinner

## 2015-10-08 NOTE — Progress Notes (Signed)
Physical Therapy Evaluation Patient Details Name: Calton GoldsGeorge Henry Eickholt MRN: [id number] DOB: 05/14/1913 Today's Date: 10/08/2015   History of Present Illness  Pt is a [age] yr old male presenting with increasing SOB for a few days. Admitted for acute COPD exacerbation. PMH significant for HTN, PTSD, CAD, COPD, hyperlipidemia, and anxiety.     Clinical Impression  Prior to admission, pt was mod I with functional mobility, though he did not mobilize much. Pt lives with his adult son (whom has Down Syndrome) in a one-level home with ramped entrance, and his adult daughter lives on adjacent property providing care/assist PRN. Pt's [employer] Case Manager, who was present at eval, states that at baseline pt ambulates no more than 25-6330ft at a time from one room to the next. He is chronically on 1-3L O2 (pt states 1L, CM states 3L), and takes it off only when he is eating or in his chair.   Currently, pt is CGA for bed mobility and sit <> stand transfers with RW. He demonstrates ability to ambulate 7030ft with RW and CGA, though his O2 sats drop to 88% on 2L O2 by nasal cannula (recover to 92% with 2-3 minutes sitting). Pt appears at his baseline, which [employer] CM confirms, though he would  benefit from skilled PT to increase activity tolerance and functional LE strength. Recommend pt discharge home with HHPT when medically appropriate.     Follow Up Recommendations Home health PT    Equipment Recommendations   (has equipment at home)    Recommendations for Other Services       Precautions / Restrictions Precautions Precautions: Fall Restrictions Weight Bearing Restrictions: No      Mobility  Bed Mobility Overal bed mobility: Needs Assistance Bed Mobility: Supine to Sit Supine to sit: HOB elevated;Min guard General bed mobility comments: Increased time required  Transfers Overall transfer level: Needs assistance Equipment used: Rolling walker (2 wheeled) Transfers: Sit to/from Stand Sit to  Stand: Min guard General transfer comment: Increased time required  Ambulation/Gait Ambulation/Gait assistance: Min guard;+2 safety/equipment Ambulation Distance (Feet): 30 Feet Assistive device: Rolling walker (2 wheeled) Gait Pattern/deviations: Decreased step length - right;Decreased step length - left Gait velocity: decreased   General Gait Details: +2 for O2/IV management. Pt with significant SOB following ambulation, noticable accessory muscle breathing. O2 sats dropped to 88% but recovered to 92% with 2-3 minutes of sitting and pursed lip breathing.   Stairs    Wheelchair Mobility    Modified Rankin (Stroke Patients Only)       Balance Overall balance assessment: Needs assistance Sitting-balance support: Bilateral upper extremity supported;Feet supported Sitting balance-Leahy Scale: Fair     Standing balance support: Bilateral upper extremity supported (on RW) Standing balance-Leahy Scale: Fair     Pertinent Vitals/Pain Pain Assessment: 0-10 Pain Score: 8  (chronic leg pain with activity) Pain Location: bilat legs Pain Descriptors / Indicators: Aching Pain Intervention(s): Limited activity within patient's tolerance;Monitored during session;Repositioned  HR monitored throughout session and maintained WFL.     Home Living Pt's [employer] case worker was present throughout eval, and was able to provide additional PLOF and home set up information as detailed below.   Family/patient expects to be discharged to: Private residence Living Arrangements: Children (lives with adult son who has down syndrome. Adult daughter lives on adjacent property and provides care PRN to pt and pt's son) Available Help at Discharge: Family; Home health RN 1x/week; Home health aide for bathing 2x/week Type of Home: House Home Access: Ramped entrance  Home Layout: One level Home Equipment: Walker - 2 wheels;Walker - 4 wheels;Grab bars - toilet;Wheelchair - manual; automatic bed (HOB  elevates and reclines) Additional Comments: Pt has urinals beside his recliner and his porch chair so that he does not have to ambulate to the bathroom throughout the day    Prior Function Level of Independence: Independent with assistive device(s)  Comments: Uses RW and 1L versus 3L O2 (discrepancy between pt report and [employer] case manager who was present at eval) at baseline. His O2 has 4725ft tubing which is as far as he ambulates at any given time. Pt reports he is typically SOB by the time he ambulates from one place to the next. Pt has pulse ox and monitors O2 sats.     Hand Dominance   Dominant Hand: Right    Extremity/Trunk Assessment   Upper Extremity Assessment: Overall WFL for tasks assessed  Lower Extremity Assessment: strength at least 3/5 throughout. Light touch sensation diminished bilateral LEs from knees > hip. Pt reports this is chronic  Cervical / Trunk Assessment: Kyphotic    Communication   Communication: No difficulties  Cognition Arousal/Alertness: Awake/alert Behavior During Therapy: WFL for tasks assessed/performed Overall Cognitive Status: Within Functional Limits for tasks assessed    General Comments General comments (skin integrity, edema, etc.): Pt requires ample encouragment and task breakdown, but will participate. Pt appreciates supine TherEx to warm up prior to any OOB activity.     Exercises General Exercises - Lower Extremity Ankle Circles/Pumps: AROM Short Arc Quad: AROM Heel Slides: AROM Hip ABduction/ADduction: AROM Straight Leg Raises: AAROM  All exercises performed bilaterally x 10 reps in supine.       Assessment/Plan    PT Assessment Patient needs continued PT services  PT Diagnosis Difficulty walking;Generalized weakness   PT Problem List Decreased activity tolerance;Decreased mobility;Decreased knowledge of use of DME;Decreased balance;Decreased strength;Impaired sensation  PT Treatment Interventions DME instruction;Gait  training;Functional mobility training;Therapeutic activities;Therapeutic exercise;Balance training;Patient/family education   PT Goals (Current goals can be found in the Care Plan section) Acute Rehab PT Goals Patient Stated Goal: To go home PT Goal Formulation: With patient Time For Goal Achievement: 10/15/15 Potential to Achieve Goals: Good    Frequency Min 2X/week   Barriers to discharge           End of Session Equipment Utilized During Treatment: Gait belt;Oxygen (2L) Activity Tolerance: Treatment limited secondary to medical complications (Comment) (Pt SOB and with O2 sats 88% after 7330ft ambulation) Patient left: in chair;with call bell/phone within reach;with chair alarm set;with family/visitor present;with SCD's reapplied Nurse Communication: Mobility status         Time: 1450-1540 PT Time Calculation (min) (ACUTE ONLY): 50 min   Charges:         PT G Codes:        Iniko Robles, SPT 10/08/2015, 4:09 PM

## 2015-10-09 LAB — BASIC METABOLIC PANEL
Anion gap: 7 (ref 5–15)
BUN: 29 mg/dL — ABNORMAL HIGH (ref 6–20)
CHLORIDE: 99 mmol/L — AB (ref 101–111)
CO2: 24 mmol/L (ref 22–32)
Calcium: 8.5 mg/dL — ABNORMAL LOW (ref 8.9–10.3)
Creatinine, Ser: 1.1 mg/dL (ref 0.61–1.24)
GFR calc Af Amer: >60 mL/min (ref >60)
GFR, EST NON AFRICAN AMERICAN: 53 mL/min — AB (ref >60)
GLUCOSE: 131 mg/dL — AB (ref 65–99)
POTASSIUM: 4.3 mmol/L (ref 3.5–5.1)
SODIUM: 130 mmol/L — AB (ref 135–145)

## 2015-10-09 MED ORDER — PREDNISONE 10 MG PO TABS: 10.0000 mg | ORAL_TABLET | Freq: Every day | ORAL | Status: DC

## 2015-10-09 MED ORDER — TIOTROPIUM BROMIDE MONOHYDRATE 18 MCG IN CAPS: 18.0000 ug | ORAL_CAPSULE | Freq: Every evening | RESPIRATORY_TRACT | Status: AC

## 2015-10-09 MED ORDER — IPRATROPIUM-ALBUTEROL 0.5-2.5 (3) MG/3ML IN SOLN
3.0000 mL | Freq: Three times a day (TID) | RESPIRATORY_TRACT | Status: DC
  Administered 2015-10-09: 3 mL via RESPIRATORY_TRACT
  Filled 2015-10-09: qty 3

## 2015-10-09 MED ORDER — LEVOFLOXACIN 250 MG PO TABS: 250.0000 mg | ORAL_TABLET | Freq: Every day | ORAL | Status: DC

## 2015-10-09 MED ORDER — LEVOFLOXACIN 500 MG PO TABS: 250.0000 mg | ORAL_TABLET | Freq: Every day | ORAL | Status: DC

## 2015-10-09 MED ORDER — ALBUTEROL SULFATE (2.5 MG/3ML) 0.083% IN NEBU: 2.5000 mg | INHALATION_SOLUTION | Freq: Three times a day (TID) | RESPIRATORY_TRACT | Status: DC | PRN

## 2015-10-09 MED ORDER — FINASTERIDE 5 MG PO TABS: 5.0000 mg | ORAL_TABLET | Freq: Every day | ORAL | Status: DC

## 2015-10-09 NOTE — Care Management Important Message (Signed)
Important Message  Patient Details  Name: Daniel Dickson MRN: [id number] Date of Birth: 05/14/1913   Medicare Important Message Given:  Yes    [name] 10/09/2015, 10:22 AM

## 2015-10-09 NOTE — Progress Notes (Signed)
Pharmacy Antibiotic Note  Daniel Dickson is a [age] y.o. male admitted on 10/07/2015 with COPD excerbation.  Pharmacy has been consulted for Levaquin dosing.  Plan: Day 3 of Abx. Levofloxacin was transition to PO. Will continue  Levofloxacin 250 mg PO daily. Recommend duration of therapy 5-7 days.  Height: 5\' 6"  (167.6 cm) Weight: 145 lb (65.772 kg) IBW/kg (Calculated) : 63.8  Temp (24hrs), Avg:97.7 F (36.5 C), Min:97.5 F (36.4 C), Max:98 F (36.7 C)   Recent Labs Lab 10/07/15 0245 10/08/15 0319 10/09/15 0552  WBC 8.5 5.4  --   CREATININE 1.26* 1.10 1.10    Estimated Creatinine Clearance: 32.2 mL/min (by C-G formula based on Cr of 1.1).    Allergies  Allergen Reactions  . Cortisone Other (See Comments)    Unknown reaction  . Dye Fdc Red [Red Dye] Other (See Comments)    Unknown reaction.  . Peanut Oil     Other reaction(s): Unknown  . Peanut-Containing Drug Products     Other reaction(s): Unknown  . Penicillins     patient not sure of reaction    Antimicrobials this admission: 6/28 LEvaquin    Microbiology results: No micro  6/28 CXR: no consolidation  Thank you for allowing pharmacy to be a part of this patient's care.  Daniel Dickson, PharmD Clinical Pharmacist 10/09/2015 9:35 AM

## 2015-10-09 NOTE — Discharge Summary (Signed)
[facility] - [place] at [facility]   PATIENT NAME: Daniel Dickson    MR#:  [id number]  DATE OF BIRTH:  05/14/1913  DATE OF ADMISSION:  10/07/2015 ADMITTING PHYSICIAN: Ihor AustinPavan Pyreddy, MD  DATE OF DISCHARGE: 10/09/2015  PRIMARY CARE PHYSICIAN: BLISS, Doreene NestLAURA K, MD    ADMISSION DIAGNOSIS:  Chronic obstructive pulmonary disease with acute exacerbation (HCC) [J44.1]  DISCHARGE DIAGNOSIS:  Active Problems:   COPD exacerbation (HCC)   Dyspnea   SECONDARY DIAGNOSIS:   Past Medical History  Diagnosis Date  . Hypertensive heart disease   . PTSD (post-traumatic stress disorder)     with headaches  . Mixed hyperlipidemia   . Coronary artery disease     a. 2012 s/p MI/cardiac arrest--> PCI mRCA.  Marland Kitchen. COPD (chronic obstructive pulmonary disease) (HCC)   . Anxiety     HOSPITAL COURSE:   [age] y/o male here with COPD exacerbation.  1. Acute on chronic hypoxic respiratory failure: This is due to COPD exacerbation. He is on 1 L of oxygen at home and back on his O2 and doing well.  2. Acute COPD exacerbation: He has showed marked improvement. He will continue with prednisone taper and Levaquin. He will continue with inhalers and oxygen.  3. Essential hypertension: Continue lisinopril, Norvasc and metoprolol. 4. BPH: Despite being on Flomax, he demonstrated urinary retention.  Case was discussed with urology. It was felt the patient would benefit from Foley with voiding trial in one week as an outpatient. He was also started on finasteride in addition to Flomax  6. Hyponatremia: Sodium has improved to 130. He will need to continue fluids at home and have EMP checked on Monday by his primary care physician.  DISCHARGE CONDITIONS AND DIET:   Stable for discharge with home health on regular diet  CONSULTS OBTAINED:  Treatment Team:  Jerilee FieldMatthew Eskridge, MD  DRUG ALLERGIES:   Allergies  Allergen Reactions  . Cortisone Other (See Comments)    Unknown reaction  . Dye Fdc  Red [Red Dye] Other (See Comments)    Unknown reaction.  . Peanut Oil     Other reaction(s): Unknown  . Peanut-Containing Drug Products     Other reaction(s): Unknown  . Penicillins     patient not sure of reaction    DISCHARGE MEDICATIONS:   Current Discharge Medication List    START taking these medications   Details  finasteride (PROSCAR) 5 MG tablet Take 1 tablet (5 mg total) by mouth daily. Qty: 30 tablet, Refills: 0    levofloxacin (LEVAQUIN) 250 MG tablet Take 1 tablet (250 mg total) by mouth daily. Qty: 3 tablet, Refills: 0    predniSONE (DELTASONE) 10 MG tablet Take 1 tablet (10 mg total) by mouth daily with breakfast. 60 mg PO (ORAL) x 2 days 50 mg PO (ORAL)  x 2 days 40 mg PO (ORAL)  x 2 days 30 mg PO  (ORAL)  x 2 days 20 mg PO  (ORAL) x 2 days 10 mg PO  (ORAL) x 2 days then stop Qty: 42 tablet, Refills: 0      CONTINUE these medications which have CHANGED   Details  albuterol (PROVENTIL) (2.5 MG/3ML) 0.083% nebulizer solution Take 3 mLs (2.5 mg total) by nebulization every 8 (eight) hours as needed for wheezing or shortness of breath. Qty: 75 mL, Refills: 12    tiotropium (SPIRIVA) 18 MCG inhalation capsule Place 1 capsule (18 mcg total) into inhaler and inhale every evening. Qty: 30 capsule, Refills: 12  CONTINUE these medications which have NOT CHANGED   Details  acetaminophen (TYLENOL) 500 MG tablet Take 500 mg by mouth daily as needed for mild pain or moderate pain.     albuterol (PROAIR HFA) 108 (90 BASE) MCG/ACT inhaler Inhale 2 puffs into the lungs every 6 (six) hours as needed. For wheezing.    amLODipine (NORVASC) 5 MG tablet Take 1 tablet (5 mg total) by mouth daily. Qty: 90 tablet, Refills: 4    aspirin EC 81 MG tablet Take 81 mg by mouth daily.    benzonatate (TESSALON) 200 MG capsule Take 200 mg by mouth 3 (three) times daily as needed for cough.    Cholecalciferol (VITAMIN D3) 1000 UNITS CAPS Take 1,000 Units by mouth daily.     fluticasone furoate-vilanterol (BREO ELLIPTA) 100-25 MCG/INH AEPB Inhale 1 puff into the lungs daily.    FUROSEMIDE PO Take 20 mg by mouth daily.     lisinopril (PRINIVIL,ZESTRIL) 5 MG tablet Take 5 mg by mouth daily.    loratadine (CLARITIN) 10 MG tablet Take 10 mg by mouth daily.    Melatonin 5 MG CAPS Take 10 mg by mouth at bedtime.    metoprolol tartrate (LOPRESSOR) 25 MG tablet Take 25 mg by mouth 2 (two) times daily.    nitroGLYCERIN (NITROSTAT) 0.4 MG SL tablet Place 0.4 mg under the tongue every 5 (five) minutes as needed for chest pain. Reported on 06/07/2015    pravastatin (PRAVACHOL) 80 MG tablet Take 80 mg by mouth daily.    tamsulosin (FLOMAX) 0.4 MG CAPS capsule Take 0.4 mg by mouth daily. Take 30 minutes after same meal each day.              Today   CHIEF COMPLAINT:   Doing very well this morning. Ambivalent about  Foley bag. Shortness of breath is improved no wheezing.   VITAL SIGNS:  Blood pressure 122/61, pulse 77, temperature 97.5 F (36.4 C), temperature source Oral, resp. rate 20, height  (1.676 m), weight 65.772 kg (145 lb), SpO2 95 %.   REVIEW OF SYSTEMS:  Review of Systems  Constitutional: Negative for fever, chills and malaise/fatigue.  HENT: Negative for ear discharge, ear pain, hearing loss, nosebleeds and sore throat.   Eyes: Negative for blurred vision and pain.  Respiratory: Negative for cough, hemoptysis, shortness of breath and wheezing.   Cardiovascular: Negative for chest pain, palpitations and leg swelling.  Gastrointestinal: Negative for nausea, vomiting, abdominal pain, diarrhea and blood in stool.  Genitourinary: Negative for dysuria.  Musculoskeletal: Negative for back pain.  Neurological: Negative for dizziness, tremors, speech change, focal weakness, seizures and headaches.  Endo/Heme/Allergies: Does not bruise/bleed easily.  Psychiatric/Behavioral: Negative for depression, suicidal ideas and hallucinations.      PHYSICAL EXAMINATION:  GENERAL:  82 y.o.-year-old patient lying in the bed with no acute distress.  NECK:  Supple, no jugular venous distention. No thyroid enlargement, no tenderness.  LUNGS: Normal breath sounds bilaterally, no wheezing, rales,rhonchi  No use of accessory muscles of respiration.  CARDIOVASCULAR: S1, S2 normal. No murmurs, rubs, or gallops.  ABDOMEN: Soft, non-tender, non-distended. Bowel sounds present. No organomegaly or mass.  EXTREMITIES: No pedal edema, cyanosis, or clubbing.  PSYCHIATRIC: The patient is alert and oriented x 3.  SKIN: No obvious rash, lesion, or ulcer.   DATA REVIEW:   CBC  Recent Labs Lab 10/08/15 0319  WBC 5.4  HGB 12.6*  HCT 35.9*  PLT 191    Chemistries   Recent Labs Lab  10/09/15 0552  NA 130*  K 4.3  CL 99*  CO2 24  GLUCOSE 131*  BUN 29*  CREATININE 1.10  CALCIUM 8.5*    Cardiac Enzymes  Recent Labs Lab 10/07/15 0245  TROPONINI <0.03    Microbiology Results  @MICRORSLT48 @  RADIOLOGY:  No results found.    Management plans discussed with the patient And daughter and He is in agreement. Stable for discharge home with home health  Patient should follow up with PCP Monday and urology 1 week for voiding trial  CODE STATUS:     Code Status Orders        Start     Ordered   10/07/15 0733  Full code   Continuous     10/07/15 0732    Code Status History    Date Active Date Inactive Code Status Order ID Comments User Context   06/07/2015 12:04 AM 06/10/2015  4:31 PM Full Code [id number]  Oralia Manisavid Willis, MD Inpatient   01/11/2015  1:54 PM 01/12/2015  1:58 PM Full Code [id number]  Alford Highlandichard Wieting, MD ED    Advance Directive Documentation        Most Recent Value   Type of Advance Directive  Healthcare Power of Attorney   Pre-existing out of facility DNR order (yellow form or pink MOST form)     "MOST" Form in Place?        TOTAL TIME TAKING CARE OF THIS PATIENT: 38 minutes.    Note: This dictation  was prepared with Dragon dictation along with smaller phrase technology. Any transcriptional errors that result from this process are unintentional.  Dreyden Rohrman M.D on 10/09/2015 at 11:07 AM  Between 7am to 6pm - Pager - [phone] After 6pm go to [email or web address] - Social research officer, governmentpassword [email or web address] [email or web address]  [facility]  Office  [phone]  CC: Primary care physician; BLISS, Doreene NestLAURA K, MD

## 2015-10-09 NOTE — Care Management (Signed)
[facility],   Contacted [employer] Case manager from [employer], [phone] to inform of discharge.

## 2015-10-09 NOTE — Progress Notes (Signed)
PHARMACIST - PHYSICIAN COMMUNICATION  CONCERNING: Antibiotic IV to Oral Route Change Policy  RECOMMENDATION: This patient is receiving Levoflxacin by the intravenous route.  Based on criteria approved by the Pharmacy and Therapeutics Committee, the antibiotic(s) is/are being converted to the equivalent oral dose form(s).   DESCRIPTION: These criteria include:  Patient being treated for a respiratory tract infection, urinary tract infection, cellulitis or clostridium difficile associated diarrhea if on metronidazole  The patient is not neutropenic and does not exhibit a GI malabsorption state  The patient is eating (either orally or via tube) and/or has been taking other orally administered medications for a least 24 hours  The patient is improving clinically and has a Tmax < 100.5  If you have questions about this conversion, please contact the Pharmacy Department [phone]( [phone] )  [facility].  Cher NakaiSheema Daryn Pisani, PharmD Clinical Pharmacist 10/09/2015 9:32 AM

## 2015-10-10 LAB — URINE CULTURE: Culture: NO GROWTH

## 2015-10-19 ENCOUNTER — Ambulatory Visit (INDEPENDENT_AMBULATORY_CARE_PROVIDER_SITE_OTHER): Payer: Medicare PPO | Admitting: Urology

## 2015-10-19 ENCOUNTER — Encounter: Payer: Self-pay | Admitting: Urology

## 2015-10-19 VITALS — BP 102/68 | HR 71 | Ht 66.0 in | Wt 138.7 lb

## 2015-10-19 DIAGNOSIS — R338 Other retention of urine: Secondary | ICD-10-CM

## 2015-10-19 NOTE — Progress Notes (Signed)
10/19/2015 4:41 PM   Daniel RodriguezGeorge Henry Dickson 05/14/1913 [id number]  Referring provider: Dortha KernLaura K Bliss, MD [address] [place], [place] [id number]  Chief Complaint  Patient presents with  . Urinary Retention    HPI: 82 year old male who presents today for a voiding trial. The patient was recently admitted to the hospital for COPD exacerbation. At that time he started to be in urinary retention. Foley catheter subsequent replaced. The patient has baseline obstructive voiding symptoms, and was taking tamsulosin. He subsequent was started on finasteride while in the hospital. The patient does not have a history of urinary retention. He has no history of recurrent urinary tract infections. He has a history of gross hematuria or dysuria.     PMH: Past Medical History  Diagnosis Date  . Hypertensive heart disease   . PTSD (post-traumatic stress disorder)     with headaches  . Mixed hyperlipidemia   . Coronary artery disease     a. 2012 s/p MI/cardiac arrest--> PCI mRCA.  Marland Kitchen. COPD (chronic obstructive pulmonary disease) (HCC)   . Anxiety     Surgical History: Past Surgical History  Procedure Laterality Date  . Hemorrhoid surgery    . Cholecystectomy    . Cataract extraction    . Vascular surgery      stent R femoral artery  . Coronary angioplasty      with stent; [facility]  . Hip surgery    . Hand surgery      Home Medications:    Medication List       This list is accurate as of: 10/19/15  4:41 PM.  Always use your most recent med list.               acetaminophen 500 MG tablet  Commonly known as:  TYLENOL  Take 500 mg by mouth daily as needed for mild pain or moderate pain.     amLODipine 5 MG tablet  Commonly known as:  NORVASC  Take 1 tablet (5 mg total) by mouth daily.     aspirin EC 81 MG tablet  Take 81 mg by mouth daily.     benzonatate 200 MG capsule  Commonly known as:  TESSALON  Take 200 mg by mouth 3 (three) times daily as needed for cough.     BREO  ELLIPTA 100-25 MCG/INH Aepb  Generic drug:  fluticasone furoate-vilanterol  Inhale 1 puff into the lungs daily. Reported on 10/19/2015     finasteride 5 MG tablet  Commonly known as:  PROSCAR  Take 1 tablet (5 mg total) by mouth daily.     FUROSEMIDE PO  Take 20 mg by mouth daily.     levofloxacin 250 MG tablet  Commonly known as:  LEVAQUIN  Take 1 tablet (250 mg total) by mouth daily.     lisinopril 5 MG tablet  Commonly known as:  PRINIVIL,ZESTRIL  Take 5 mg by mouth daily.     loratadine 10 MG tablet  Commonly known as:  CLARITIN  Take 10 mg by mouth daily.     Melatonin 5 MG Caps  Take 10 mg by mouth at bedtime.     metoprolol tartrate 25 MG tablet  Commonly known as:  LOPRESSOR  Take 25 mg by mouth 2 (two) times daily.     nitroGLYCERIN 0.4 MG SL tablet  Commonly known as:  NITROSTAT  Place 0.4 mg under the tongue every 5 (five) minutes as needed for chest pain. Reported on 10/19/2015  pravastatin 80 MG tablet  Commonly known as:  PRAVACHOL  Take 80 mg by mouth daily.     predniSONE 10 MG tablet  Commonly known as:  DELTASONE  Take 1 tablet (10 mg total) by mouth daily with breakfast. 60 mg PO (ORAL) x 2 days 50 mg PO (ORAL)  x 2 days 40 mg PO (ORAL)  x 2 days 30 mg PO  (ORAL)  x 2 days 20 mg PO  (ORAL) x 2 days 10 mg PO  (ORAL) x 2 days then stop     PROAIR HFA 108 (90 Base) MCG/ACT inhaler  Generic drug:  albuterol  Inhale 2 puffs into the lungs every 6 (six) hours as needed. For wheezing.     albuterol (2.5 MG/3ML) 0.083% nebulizer solution  Commonly known as:  PROVENTIL  Take 3 mLs (2.5 mg total) by nebulization every 8 (eight) hours as needed for wheezing or shortness of breath.     tamsulosin 0.4 MG Caps capsule  Commonly known as:  FLOMAX  Take 0.4 mg by mouth daily. Take 30 minutes after same meal each day.     tiotropium 18 MCG inhalation capsule  Commonly known as:  SPIRIVA  Place 1 capsule (18 mcg total) into inhaler and inhale every evening.       Vitamin D3 1000 units Caps  Take 1,000 Units by mouth daily.        Allergies:  Allergies  Allergen Reactions  . Cortisone Other (See Comments)    Unknown reaction  . Dye Fdc Red [Red Dye] Other (See Comments)    Unknown reaction.  . Peanut Oil     Other reaction(s): Unknown  . Peanut-Containing Drug Products     Other reaction(s): Unknown  . Penicillins     patient not sure of reaction    Family History: Family History  Problem Relation Age of Onset  . COPD Father     Social History:  reports that he has quit smoking. His smoking use included Cigarettes. He has a 120 pack-year smoking history. He does not have any smokeless tobacco history on file. He reports that he drinks alcohol. He reports that he does not use illicit drugs.  ROS: UROLOGY Frequent Urination?: No Hard to postpone urination?: No Burning/pain with urination?: No Get up at night to urinate?: No Leakage of urine?: No Urine stream starts and stops?: No Trouble starting stream?: No Do you have to strain to urinate?: No Blood in urine?: No Urinary tract infection?: No Sexually transmitted disease?: No Injury to kidneys or bladder?: No Painful intercourse?: No Weak stream?: No Erection problems?: No Penile pain?: No  Gastrointestinal Nausea?: No Vomiting?: No Indigestion/heartburn?: No Diarrhea?: No Constipation?: No  Constitutional Fever: No Night sweats?: No Weight loss?: No Fatigue?: No  Skin Skin rash/lesions?: No Itching?: No  Eyes Blurred vision?: No Double vision?: No  Ears/Nose/Throat Sore throat?: No Sinus problems?: No  Hematologic/Lymphatic Swollen glands?: No Easy bruising?: No  Cardiovascular Leg swelling?: No Chest pain?: No  Respiratory Cough?: Yes Shortness of breath?: Yes  Endocrine Excessive thirst?: No  Musculoskeletal Back pain?: No Joint pain?: No  Neurological Headaches?: No Dizziness?: No  Psychologic Depression?: No Anxiety?:  No  Physical Exam: BP 102/68 mmHg  Pulse 71  Ht 5\' 6"  (1.676 m)  Wt 62.914 kg (138 lb 11.2 oz)  BMI 22.40 kg/m2  Constitutional:  Alert and oriented, No acute distress. HEENT: [place] AT, moist mucus membranes.  Trachea midline, no masses. Cardiovascular: No clubbing, cyanosis,  or edema. Respiratory: Normal respiratory effort, no increased work of breathing. GI: Abdomen is soft, nontender, nondistended, no abdominal masses Skin: No rashes, bruises or suspicious lesions. Lymph: No cervical or inguinal adenopathy. Neurologic: Grossly intact, no focal deficits, moving all 4 extremities. Psychiatric: Normal mood and affect.  Laboratory Data: Lab Results  Component Value Date   WBC 5.4 10/08/2015   HGB 12.6* 10/08/2015   HCT 35.9* 10/08/2015   MCV 96.0 10/08/2015   PLT 191 10/08/2015    Lab Results  Component Value Date   CREATININE 1.10 10/09/2015    No results found for: PSA  No results found for: TESTOSTERONE  No results found for: HGBA1C  Urinalysis    Component Value Date/Time   COLORURINE YELLOW* 10/07/2015 1020   APPEARANCEUR CLEAR* 10/07/2015 1020   LABSPEC 1.010 10/07/2015 1020   PHURINE 7.0 10/07/2015 1020   GLUCOSEU NEGATIVE 10/07/2015 1020   HGBUR NEGATIVE 10/07/2015 1020   BILIRUBINUR NEGATIVE 10/07/2015 1020   KETONESUR NEGATIVE 10/07/2015 1020   PROTEINUR NEGATIVE 10/07/2015 1020   NITRITE NEGATIVE 10/07/2015 1020   LEUKOCYTESUR NEGATIVE 10/07/2015 1020    Pertinent Imaging:   Assessment & Plan:  The patient has passed his voiding trial. I recommended that he discontinue finasteride if this is not going to be any significant benefit to him for the [place] next 6 months. Prior to this episode of COPD exacerbation he is not having any significant issues. I suspect that he will return to his baseline. As such, I recommended no additional therapy. The patient will follow-up with me on an as-needed basis. He is planning to be evaluated the [facility] tomorrow, I encouraged the patient to request a PVR there tomorrow to ensure that he is emptying his bladder.  There are no diagnoses linked to this encounter.  Return if symptoms worsen or fail to improve.  Crist Fat, MD  [facility] [address] [place], [place] [address] [phone]

## 2015-10-19 NOTE — Progress Notes (Signed)
Fill and Pull Catheter Removal  Patient is present today for a catheter removal.  Patient was cleaned and prepped in a sterile fashion 170 ml of sterile water/ saline was instilled into the bladder when the patient felt the urge to urinate. 8 ml of water was then drained from the balloon.  A 16 FR foley cath was removed from the bladder no complications were noted .  Patient as then given some time to void on their own.  Patient can void 120 ml on their own after some time.  Patient tolerated well.  Preformed by: K.Cuinn Westerhold,CMA

## 2015-10-19 NOTE — Patient Instructions (Signed)
Patient should stop finasteride, I do not feel this be helpful for him today. It is likely to take 6 months prior to any noticeable effect. The patient also requests that we discontinue his walker and allow him to use his cane's again, this may be safer for him as it gives him a little bit more freedom from the obstacles within his living environment.

## 2015-10-20 ENCOUNTER — Encounter: Payer: Self-pay | Admitting: *Deleted

## 2015-10-20 ENCOUNTER — Emergency Department
Admission: EM | Admit: 2015-10-20 | Discharge: 2015-10-20 | Disposition: A | Discharge status: Home / Self Care | Payer: Medicare PPO | Attending: Emergency Medicine | Admitting: Emergency Medicine

## 2015-10-20 DIAGNOSIS — T83511A Infection and inflammatory reaction due to indwelling urethral catheter, initial encounter: Secondary | ICD-10-CM | POA: Insufficient documentation

## 2015-10-20 DIAGNOSIS — R339 Retention of urine, unspecified: Secondary | ICD-10-CM

## 2015-10-20 DIAGNOSIS — J441 Chronic obstructive pulmonary disease with (acute) exacerbation: Secondary | ICD-10-CM | POA: Insufficient documentation

## 2015-10-20 DIAGNOSIS — E785 Hyperlipidemia, unspecified: Secondary | ICD-10-CM | POA: Insufficient documentation

## 2015-10-20 DIAGNOSIS — I10 Essential (primary) hypertension: Secondary | ICD-10-CM | POA: Insufficient documentation

## 2015-10-20 DIAGNOSIS — Z79899 Other long term (current) drug therapy: Secondary | ICD-10-CM | POA: Insufficient documentation

## 2015-10-20 DIAGNOSIS — Y828 Other medical devices associated with adverse incidents: Secondary | ICD-10-CM | POA: Diagnosis not present

## 2015-10-20 DIAGNOSIS — N390 Urinary tract infection, site not specified: Secondary | ICD-10-CM | POA: Insufficient documentation

## 2015-10-20 DIAGNOSIS — I2510 Atherosclerotic heart disease of native coronary artery without angina pectoris: Secondary | ICD-10-CM | POA: Diagnosis not present

## 2015-10-20 DIAGNOSIS — I119 Hypertensive heart disease without heart failure: Secondary | ICD-10-CM | POA: Insufficient documentation

## 2015-10-20 DIAGNOSIS — N4889 Other specified disorders of penis: Secondary | ICD-10-CM | POA: Diagnosis present

## 2015-10-20 DIAGNOSIS — Y732 Prosthetic and other implants, materials and accessory gastroenterology and urology devices associated with adverse incidents: Secondary | ICD-10-CM | POA: Diagnosis not present

## 2015-10-20 DIAGNOSIS — Z87891 Personal history of nicotine dependence: Secondary | ICD-10-CM | POA: Insufficient documentation

## 2015-10-20 DIAGNOSIS — Z7982 Long term (current) use of aspirin: Secondary | ICD-10-CM | POA: Insufficient documentation

## 2015-10-20 LAB — BASIC METABOLIC PANEL
Anion gap: 11 (ref 5–15)
BUN: 32 mg/dL — AB (ref 6–20)
CHLORIDE: 93 mmol/L — AB (ref 101–111)
CO2: 24 mmol/L (ref 22–32)
Calcium: 8.6 mg/dL — ABNORMAL LOW (ref 8.9–10.3)
Creatinine, Ser: 1.24 mg/dL (ref 0.61–1.24)
GFR calc Af Amer: 53 mL/min — ABNORMAL LOW (ref >60)
GFR calc non Af Amer: 46 mL/min — ABNORMAL LOW (ref >60)
Glucose, Bld: 77 mg/dL (ref 65–99)
POTASSIUM: 3.7 mmol/L (ref 3.5–5.1)
Sodium: 128 mmol/L — ABNORMAL LOW (ref 135–145)

## 2015-10-20 LAB — URINALYSIS COMPLETE WITH MICROSCOPIC (ARMC ONLY)
BILIRUBIN URINE: NEGATIVE
Bacteria, UA: NONE SEEN
GLUCOSE, UA: NEGATIVE mg/dL
HGB URINE DIPSTICK: NEGATIVE
KETONES UR: NEGATIVE mg/dL
NITRITE: POSITIVE — AB
Protein, ur: NEGATIVE mg/dL
SPECIFIC GRAVITY, URINE: 1.006 (ref 1.005–1.030)
SQUAMOUS EPITHELIAL / LPF: NONE SEEN
pH: 7 (ref 5.0–8.0)

## 2015-10-20 LAB — CBC WITH DIFFERENTIAL/PLATELET
BASOS ABS: 0.2 10*3/uL — AB (ref 0–0.1)
Basophils Relative: 2 %
EOS PCT: 1 %
Eosinophils Absolute: 0.1 10*3/uL (ref 0–0.7)
HEMATOCRIT: 40.7 % (ref 40.0–52.0)
HEMOGLOBIN: 13.9 g/dL (ref 13.0–18.0)
LYMPHS ABS: 1 10*3/uL (ref 1.0–3.6)
LYMPHS PCT: 8 %
MCH: 32.9 pg (ref 26.0–34.0)
MCHC: 34.1 g/dL (ref 32.0–36.0)
MCV: 96.4 fL (ref 80.0–100.0)
Monocytes Absolute: 0.5 10*3/uL (ref 0.2–1.0)
Monocytes Relative: 4 %
NEUTROS ABS: 10.9 10*3/uL — AB (ref 1.4–6.5)
Neutrophils Relative %: 85 %
PLATELETS: 171 10*3/uL (ref 150–440)
RBC: 4.22 MIL/uL — AB (ref 4.40–5.90)
RDW: 13.6 % (ref 11.5–14.5)
WBC: 12.8 10*3/uL — AB (ref 3.8–10.6)

## 2015-10-20 MED ORDER — SULFAMETHOXAZOLE-TRIMETHOPRIM 800-160 MG PO TABS: 1.0000 | ORAL_TABLET | Freq: Two times a day (BID) | ORAL | Status: DC

## 2015-10-20 MED ORDER — FINASTERIDE 5 MG PO TABS
5.0000 mg | ORAL_TABLET | Freq: Once | ORAL | Status: AC
  Administered 2015-10-20: 5 mg via ORAL
  Filled 2015-10-20: qty 1

## 2015-10-20 MED ORDER — FINASTERIDE 5 MG PO TABS: 5.0000 mg | ORAL_TABLET | Freq: Every day | ORAL | Status: DC

## 2015-10-20 MED ORDER — LIDOCAINE HCL 2 % EX GEL
1.0000 "application " | Freq: Once | CUTANEOUS | Status: AC
  Administered 2015-10-20: 1 via URETHRAL

## 2015-10-20 MED ORDER — SULFAMETHOXAZOLE-TRIMETHOPRIM 800-160 MG PO TABS
1.0000 | ORAL_TABLET | Freq: Once | ORAL | Status: AC
  Administered 2015-10-20: 1 via ORAL
  Filled 2015-10-20: qty 1

## 2015-10-20 MED ORDER — LIDOCAINE HCL 2 % EX GEL: 1.0000 "application " | Freq: Once | CUTANEOUS | Status: DC

## 2015-10-20 MED ORDER — LIDOCAINE HCL 2 % EX GEL
CUTANEOUS | Status: AC
  Filled 2015-10-20: qty 10

## 2015-10-20 NOTE — ED Notes (Addendum)
Per EMS report, patient had Foley catheter removed within the last two days and now c/o penis pain that became present today and worsened in the last hour. Patient states he last voided prior to the pain worsening.

## 2015-10-20 NOTE — Discharge Instructions (Signed)
You have been seen in the Emergency Department (ED)  today for a urinary tract infection.  Most UTIs are caused by bacteria and need to be treated with antibiotics. It is important to complete your treatment so that the infection does not get worse. Take your antibiotics fully even if your symptoms start to get better after the first few doses. Drink PLENTY of fluids to help clear the infection.  Follow-up with your doctor or return to the ER immediately if your symptoms are getting worse, if you develop a fever, if you develop abdominal or flank pain, or if you start to vomit. Otherwise follow up with your doctor in 1 week if your symptoms are improving.   When should you call for help?  Call your doctor now or seek immediate medical care if:  Symptoms such as a fever, chills, nausea, or vomiting get worse or happen for the first time.  You have new pain in your back just below your rib cage. This is called flank pain.  There is new blood or pus in your urine.  You are not able to take or keep down your antibiotics. Your symptoms are not getting better after 48 hours of antibiotic treatment  Watch closely for changes in your health, and be sure to contact your doctor if:  You are not getting better after taking an antibiotic for 2 days.  Your symptoms go away but then come back.   How can you care for yourself at home?  Take your antibiotics as prescribed. Do not stop taking them just because you feel better. You need to take the full course of antibiotics.  Take your medicines exactly as prescribed. Your doctor may have prescribed a medicine, such as phenazopyridine (Pyridium), to help relieve pain when you urinate. This turns your urine orange. You may stop taking it when your symptoms get better. But be sure to take all of your antibiotics, which treat the infection.  Drink extra water and juices such as cranberry and blueberry juices for the next day or two. This will help make the urine  less concentrated and help wash out the bacteria causing the infection. (If you have kidney, heart, or liver disease and have to limit your fluids, talk with your doctor before you increase your fluid intake.)  Avoid drinks that are carbonated or have caffeine. They can irritate the bladder.  Urinate often. Try to empty your bladder each time.  To relieve pain, take a hot bath or lay a heating pad (set on low) over your lower belly or genital area. Never go to sleep with a heating pad in place.  To help prevent UTIs  Drink plenty of fluids, enough so that your urine is light yellow or clear like water. If you have kidney, heart, or liver disease and have to limit fluids, talk with your doctor before you increase the amount of fluids you drink.  Urinate when you have the urge. Do not hold your urine for a long time. Urinate before you go to sleep.  Keep your vagina/ penis clean.    FOLEY CARE  Catheter care  Always wash your hands before and after touching your catheter.  Check the area around the urethra for inflammation or signs of infection. Signs of infection include irritated, swollen, red, or tender skin, or pus around the catheter.  Clean the area around the catheter with soap and water two times a day. Dry with a clean towel afterward.  Do not apply  powder or lotion to the skin around the catheter.  To empty the urine collection bag  Wash your hands with soap and water.  Without touching the drain spout, remove the spout from its sleeve at the bottom of the collection bag. Open the valve on the spout.  Let the urine flow out of the bag and into the toilet or a container. Do not let the tubing or drain spout touch anything.  After you empty the bag, clean the end of the drain spout with tissue and water. Close the valve and put the drain spout back into its sleeve at the bottom of the collection bag.  Wash your hands with soap and water.

## 2015-10-20 NOTE — ED Notes (Signed)
Bladder scan is .

## 2015-10-20 NOTE — ED Provider Notes (Signed)
[facility] Emergency Department Provider Note  ____________________________________________  Time seen: Approximately 10:06 PM  I have reviewed the triage vital signs and the nursing notes.   HISTORY  Chief Complaint Penis Pain   HPI Daniel Dickson is a 82 y.o. male with a history of hypertension, COPD, CAD who presents for evaluation of urinary retention. Patient was recently admitted to the hospital for COPD and develop urinary retention. He was started on finasteride and had a Foley catheter placed. He saw urology yesterday and the Foley was removed. He reports that since then has been having some urinary dribbling but not being able to fully urinate. This evening he comes in with severe suprapubic pain, abdominal distention, nonradiating, sharp, associated with inability to urinate. He denies fever, nausea, vomiting, dysuria. Patient's finasteride was discontinued by urology yesterday.  Past Medical History  Diagnosis Date  . Hypertensive heart disease   . PTSD (post-traumatic stress disorder)     with headaches  . Mixed hyperlipidemia   . Coronary artery disease     a. 2012 s/p MI/cardiac arrest--> PCI mRCA.  Marland Kitchen COPD (chronic obstructive pulmonary disease) (HCC)   . Anxiety     Patient Active Problem List   Diagnosis Date Noted  . Dyspnea 10/07/2015  . Hyponatremia 06/06/2015  . Coronary artery disease   . Hypertensive heart disease   . Mixed hyperlipidemia   . COPD (chronic obstructive pulmonary disease) (HCC)   . COPD exacerbation (HCC) 01/11/2015  . Moderate COPD (chronic obstructive pulmonary disease) (HCC) 12/08/2014  . Fall 09/03/2014  . Hip fracture (HCC) 09/03/2014  . PTSD (post-traumatic stress disorder) 09/03/2014  . Bilateral leg edema 09/03/2014  . SOB (shortness of breath) 10/31/2012  . Essential hypertension 10/31/2012  . CAD (coronary artery disease) 10/31/2012  . Hyperlipidemia 10/31/2012  . Tachycardia 10/31/2012     Past Surgical History  Procedure Laterality Date  . Hemorrhoid surgery    . Cholecystectomy    . Cataract extraction    . Vascular surgery      stent R femoral artery  . Coronary angioplasty      with stent; [facility]  . Hip surgery    . Hand surgery      Current Outpatient Rx  Name  Route  Sig  Dispense  Refill  . acetaminophen (TYLENOL) 500 MG tablet   Oral   Take 500 mg by mouth daily as needed for mild pain or moderate pain.          Marland Kitchen albuterol (PROAIR HFA) 108 (90 BASE) MCG/ACT inhaler   Inhalation   Inhale 2 puffs into the lungs every 6 (six) hours as needed. For wheezing.         Marland Kitchen albuterol (PROVENTIL) (2.5 MG/3ML) 0.083% nebulizer solution   Nebulization   Take 3 mLs (2.5 mg total) by nebulization every 8 (eight) hours as needed for wheezing or shortness of breath.   75 mL   12   . amLODipine (NORVASC) 5 MG tablet   Oral   Take 1 tablet (5 mg total) by mouth daily.   90 tablet   4   . aspirin EC 81 MG tablet   Oral   Take 81 mg by mouth daily.         . benzonatate (TESSALON) 200 MG capsule   Oral   Take 200 mg by mouth 3 (three) times daily as needed for cough.         . Cholecalciferol (VITAMIN D3) 1000  UNITS CAPS   Oral   Take 1,000 Units by mouth daily.         . fluticasone furoate-vilanterol (BREO ELLIPTA) 100-25 MCG/INH AEPB   Inhalation   Inhale 1 puff into the lungs daily. Reported on 10/19/2015         . FUROSEMIDE PO   Oral   Take 20 mg by mouth daily.          Marland Kitchen lisinopril (PRINIVIL,ZESTRIL) 5 MG tablet   Oral   Take 5 mg by mouth daily.         Marland Kitchen loratadine (CLARITIN) 10 MG tablet   Oral   Take 10 mg by mouth daily.         . Melatonin 5 MG CAPS   Oral   Take 10 mg by mouth at bedtime.         . metoprolol tartrate (LOPRESSOR) 25 MG tablet   Oral   Take 25 mg by mouth 2 (two) times daily.         . nitroGLYCERIN (NITROSTAT) 0.4 MG SL tablet   Sublingual   Place 0.4 mg under the tongue every 5 (five)  minutes as needed for chest pain. Reported on 10/19/2015         . pravastatin (PRAVACHOL) 80 MG tablet   Oral   Take 80 mg by mouth daily.         . tamsulosin (FLOMAX) 0.4 MG CAPS capsule   Oral   Take 0.4 mg by mouth daily. Take 30 minutes after same meal each day.         . tiotropium (SPIRIVA) 18 MCG inhalation capsule   Inhalation   Place 1 capsule (18 mcg total) into inhaler and inhale every evening.   30 capsule   12   . finasteride (PROSCAR) 5 MG tablet   Oral   Take 1 tablet (5 mg total) by mouth daily.   15 tablet   0   . levofloxacin (LEVAQUIN) 250 MG tablet   Oral   Take 1 tablet (250 mg total) by mouth daily. Patient not taking: Reported on 10/19/2015   3 tablet   0   . predniSONE (DELTASONE) 10 MG tablet   Oral   Take 1 tablet (10 mg total) by mouth daily with breakfast. 60 mg PO (ORAL) x 2 days 50 mg PO (ORAL)  x 2 days 40 mg PO (ORAL)  x 2 days 30 mg PO  (ORAL)  x 2 days 20 mg PO  (ORAL) x 2 days 10 mg PO  (ORAL) x 2 days then stop Patient not taking: Reported on 10/19/2015   42 tablet   0     Label  & dispense according to the schedule below: ...   . sulfamethoxazole-trimethoprim (BACTRIM DS,SEPTRA DS) 800-160 MG tablet   Oral   Take 1 tablet by mouth 2 (two) times daily.   20 tablet   0     Allergies Cortisone; Dye fdc red; Peanut oil; Peanut-containing drug products; and Penicillins  Family History  Problem Relation Age of Onset  . COPD Father     Social History Social History  Substance Use Topics  . Smoking status: Former Smoker -- 3.00 packs/day for 40 years    Types: Cigarettes  . Smokeless tobacco: None  . Alcohol Use: Yes     Comment: wine at night.     Review of Systems Constitutional: Negative for fever. Eyes: Negative for visual changes. ENT: Negative for sore  throat. Cardiovascular: Negative for chest pain. Respiratory: Negative for shortness of breath. Gastrointestinal: + suprapubic  abdominal pain. No  vomiting or diarrhea. Genitourinary: Negative for dysuria. + Urinary retention Musculoskeletal: Negative for back pain. Skin: Negative for rash. Neurological: Negative for headaches, weakness or numbness.  ____________________________________________   PHYSICAL EXAM:  VITAL SIGNS: ED Triage Vitals  Enc Vitals Group     BP 10/20/15 2128 168/87 mmHg     Pulse Rate 10/20/15 2128 88     Resp 10/20/15 2128 20     Temp --      Temp src --      SpO2 10/20/15 2128 100 %     Weight 10/20/15 2128 136 lb (61.689 kg)     Height 10/20/15 2128 5\' 6"  (1.676 m)     Head Cir --      Peak Flow --      Pain Score 10/20/15 2129 9     Pain Loc --      Pain Edu? --      Excl. in GC? --     Constitutional: Alert and oriented. Well appearing and in no apparent distress. HEENT:      Head: Normocephalic and atraumatic.         Eyes: Conjunctivae are normal. Sclera is non-icteric. EOMI. PERRL      Mouth/Throat: Mucous membranes are moist.       Neck: Supple with no signs of meningismus. Cardiovascular: Regular rate and rhythm. No murmurs, gallops, or rubs. 2+ symmetrical distal pulses are present in all extremities. No JVD. Respiratory: Normal respiratory effort. Lungs are clear to auscultation bilaterally. No wheezes, crackles, or rhonchi.  Gastrointestinal: Soft, suprapubic distention and tenderness with positive bowel sounds. No rebound or guarding. Musculoskeletal: Nontender with normal range of motion in all extremities. No edema, cyanosis, or erythema of extremities. Neurologic: Normal speech and language. Face is symmetric. Moving all extremities. No gross focal neurologic deficits are appreciated. Skin: Skin is warm, dry and intact. No rash noted. Psychiatric: Mood and affect are normal. Speech and behavior are normal.  ____________________________________________   LABS (all labs ordered are listed, but only abnormal results are displayed)  Labs Reviewed  URINALYSIS COMPLETEWITH  MICROSCOPIC ([facility] ONLY) - Abnormal; Notable for the following:    Color, Urine STRAW (*)    APPearance CLEAR (*)    Nitrite POSITIVE (*)    Leukocytes, UA TRACE (*)    All other components within normal limits  BASIC METABOLIC PANEL - Abnormal; Notable for the following:    Sodium 128 (*)    Chloride 93 (*)    BUN 32 (*)    Calcium 8.6 (*)    GFR calc non Af Amer 46 (*)    GFR calc Af Amer 53 (*)    All other components within normal limits  CBC WITH DIFFERENTIAL/PLATELET - Abnormal; Notable for the following:    WBC 12.8 (*)    RBC 4.22 (*)    Neutro Abs 10.9 (*)    Basophils Absolute 0.2 (*)    All other components within normal limits  URINE CULTURE   ____________________________________________  EKG  none ____________________________________________  RADIOLOGY  none  ____________________________________________   PROCEDURES  Procedure(s) performed: None Critical Care performed:  None ____________________________________________   INITIAL IMPRESSION / ASSESSMENT AND PLAN / ED COURSE  [age] y.o. male with a history of hypertension, COPD, CAD who presents for evaluation of urinary retention after having his Foley catheter removed yesterday by urology. Bladder scan with  750cc. Foley catheter was placed with Urojet and with immediate relief of patient's pain. We'll check kidney function and a UA for any signs of urinary tract infection. Will start patient on Flomax. We'll refer her back to urology.   _________________________ 11:03 PM on 10/20/2015 -----------------------------------------  UA concerning for urinary tract infection with positive nitrites. Mild leukocytosis. We'll start patient on Bactrim. Kidney function within patient's baseline range. Patient started on finasteride. Will dc with f/u with Urology in 1 week. Foley instructions provided.   Pertinent labs & imaging results that were available during my care of the patient were reviewed by me and  considered in my medical decision making (see chart for details).   I discussed my evaluation of the patient's symptoms, my clinical impression, and my proposed outpatient treatment plan with patient. We have discussed anticipatory guidance, scheduled follow-up, and careful return precautions. The patient expresses understanding and is comfortable with the discharge plan. All patient's questions were answered.    ____________________________________________   FINAL CLINICAL IMPRESSION(S) / ED DIAGNOSES  Final diagnoses:  Urinary retention  Urinary tract infection associated with indwelling urethral catheter, initial encounter      NEW MEDICATIONS STARTED DURING THIS VISIT:  New Prescriptions   FINASTERIDE (PROSCAR) 5 MG TABLET    Take 1 tablet (5 mg total) by mouth daily.   SULFAMETHOXAZOLE-TRIMETHOPRIM (BACTRIM DS,SEPTRA DS) 800-160 MG TABLET    Take 1 tablet by mouth 2 (two) times daily.     Note:  This document was prepared using Dragon voice recognition software and may include unintentional dictation errors.    Nita Sickle, MD 10/20/15 [phone]

## 2015-10-20 NOTE — ED Notes (Signed)
Report given to Kate RN

## 2015-10-20 NOTE — ED Notes (Signed)
Granddaughter called to check on pt. Informed that pt will d/c shortly. She is coming to pick him up.

## 2015-10-22 LAB — URINE CULTURE

## 2015-10-24 ENCOUNTER — Emergency Department: Payer: Medicare PPO

## 2015-10-24 ENCOUNTER — Encounter: Payer: Self-pay | Admitting: Emergency Medicine

## 2015-10-24 ENCOUNTER — Emergency Department
Admission: EM | Admit: 2015-10-24 | Discharge: 2015-10-24 | Disposition: A | Discharge status: Home / Self Care | Payer: Medicare PPO | Attending: Emergency Medicine | Admitting: Emergency Medicine

## 2015-10-24 DIAGNOSIS — I2510 Atherosclerotic heart disease of native coronary artery without angina pectoris: Secondary | ICD-10-CM | POA: Diagnosis not present

## 2015-10-24 DIAGNOSIS — Y939 Activity, unspecified: Secondary | ICD-10-CM | POA: Diagnosis not present

## 2015-10-24 DIAGNOSIS — S41111A Laceration without foreign body of right upper arm, initial encounter: Secondary | ICD-10-CM | POA: Insufficient documentation

## 2015-10-24 DIAGNOSIS — Z87891 Personal history of nicotine dependence: Secondary | ICD-10-CM | POA: Diagnosis not present

## 2015-10-24 DIAGNOSIS — J441 Chronic obstructive pulmonary disease with (acute) exacerbation: Secondary | ICD-10-CM | POA: Insufficient documentation

## 2015-10-24 DIAGNOSIS — M62838 Other muscle spasm: Secondary | ICD-10-CM | POA: Insufficient documentation

## 2015-10-24 DIAGNOSIS — Y92009 Unspecified place in unspecified non-institutional (private) residence as the place of occurrence of the external cause: Secondary | ICD-10-CM | POA: Diagnosis not present

## 2015-10-24 DIAGNOSIS — E782 Mixed hyperlipidemia: Secondary | ICD-10-CM | POA: Insufficient documentation

## 2015-10-24 DIAGNOSIS — Z79899 Other long term (current) drug therapy: Secondary | ICD-10-CM | POA: Insufficient documentation

## 2015-10-24 DIAGNOSIS — Y999 Unspecified external cause status: Secondary | ICD-10-CM | POA: Diagnosis not present

## 2015-10-24 DIAGNOSIS — T148XXA Other injury of unspecified body region, initial encounter: Secondary | ICD-10-CM

## 2015-10-24 DIAGNOSIS — W1839XA Other fall on same level, initial encounter: Secondary | ICD-10-CM | POA: Insufficient documentation

## 2015-10-24 DIAGNOSIS — I119 Hypertensive heart disease without heart failure: Secondary | ICD-10-CM | POA: Insufficient documentation

## 2015-10-24 DIAGNOSIS — S3992XA Unspecified injury of lower back, initial encounter: Secondary | ICD-10-CM | POA: Diagnosis present

## 2015-10-24 DIAGNOSIS — Z7982 Long term (current) use of aspirin: Secondary | ICD-10-CM | POA: Diagnosis not present

## 2015-10-24 MED ORDER — CARISOPRODOL 350 MG PO TABS: 350.0000 mg | ORAL_TABLET | Freq: Three times a day (TID) | ORAL | Status: DC | PRN

## 2015-10-24 MED ORDER — BACITRACIN ZINC 500 UNIT/GM EX OINT
TOPICAL_OINTMENT | Freq: Two times a day (BID) | CUTANEOUS | Status: DC
  Filled 2015-10-24: qty 2.7

## 2015-10-24 MED ORDER — OXYCODONE-ACETAMINOPHEN 5-325 MG PO TABS
1.0000 | ORAL_TABLET | Freq: Once | ORAL | Status: AC
  Administered 2015-10-24: 1 via ORAL
  Filled 2015-10-24: qty 1

## 2015-10-24 MED ORDER — CARISOPRODOL 350 MG PO TABS: 350.0000 mg | ORAL_TABLET | Freq: Once | ORAL | Status: DC

## 2015-10-24 MED ORDER — CARISOPRODOL 350 MG PO TABS
350.0000 mg | ORAL_TABLET | Freq: Once | ORAL | Status: AC
  Administered 2015-10-24: 350 mg via ORAL
  Filled 2015-10-24: qty 1

## 2015-10-24 NOTE — ED Notes (Signed)
[facility]. Today patient was unable to get up out of his chair today. Patient has skin tear to the left hand, right upper arm and back. Patient given 50 mcg of Fentanyl by EMS. Patient has chronic foley catheter

## 2015-10-24 NOTE — ED Notes (Signed)
Patient unable to ambulate with walker

## 2015-10-24 NOTE — ED Notes (Signed)
Patient daughter, Daniel Dickson, called to get an update on patient. Patient gave permission to speak with Daniel BiblePat. Pat informed that X-ray and CT were both negative and that patients vital signs are stable. Informed Daniel Dickson that we gave patient a muscle relaxer to see if he will be able to ambulate.

## 2015-10-24 NOTE — ED Notes (Signed)
Patient fell yesterday. Patient denies pain except for when he tries to move his right leg then he has pain in his lower back. Patient denies LOC or hitting head, states that he lost his footing and fell.

## 2015-10-24 NOTE — ED Notes (Signed)
Patient transported to CT 

## 2015-10-24 NOTE — ED Provider Notes (Signed)
[facility] Emergency Department Provider Note   ____________________________________________  Seen upon arrival to the emergency department  I have reviewed the triage vital signs and the nursing notes.   HISTORY  Chief Complaint Fall   HPI Daniel Dickson is a 82 y.o. male with a history of hypertension as well as PTSD is presenting to the emergency department today after a fall yesterday.He says that he turned around too quickly and lost his footing falling on his right buttock as well as his bilateral arms. He denies hitting his head or losing consciousness. He denies any neck pain.  He was brought in by EMS after not being able to walk today. He says that when he lifts his right leg he has a "cramping" pain in the right buttock. EMS reported that he taken tramadol earlier today and then given 50 g of fentanyl en route. Patient says that after he fell he was helped to a recliner by relatives where he has been since yesterday. He says he normally walks with a walker. Has an indwelling Foley. Does not know the date of his last tetanus shot. At the time of the fall patient also suffered several skin tears and placed gauze bandages over them.   Past Medical History  Diagnosis Date  . Hypertensive heart disease   . PTSD (post-traumatic stress disorder)     with headaches  . Mixed hyperlipidemia   . Coronary artery disease     a. 2012 s/p MI/cardiac arrest--> PCI mRCA.  Marland Kitchen COPD (chronic obstructive pulmonary disease) (HCC)   . Anxiety     Patient Active Problem List   Diagnosis Date Noted  . Dyspnea 10/07/2015  . Hyponatremia 06/06/2015  . Coronary artery disease   . Hypertensive heart disease   . Mixed hyperlipidemia   . COPD (chronic obstructive pulmonary disease) (HCC)   . COPD exacerbation (HCC) 01/11/2015  . Moderate COPD (chronic obstructive pulmonary disease) (HCC) 12/08/2014  . Fall 09/03/2014  . Hip fracture (HCC) 09/03/2014  . PTSD  (post-traumatic stress disorder) 09/03/2014  . Bilateral leg edema 09/03/2014  . SOB (shortness of breath) 10/31/2012  . Essential hypertension 10/31/2012  . CAD (coronary artery disease) 10/31/2012  . Hyperlipidemia 10/31/2012  . Tachycardia 10/31/2012    Past Surgical History  Procedure Laterality Date  . Hemorrhoid surgery    . Cholecystectomy    . Cataract extraction    . Vascular surgery      stent R femoral artery  . Coronary angioplasty      with stent; [facility]  . Hip surgery    . Hand surgery      Current Outpatient Rx  Name  Route  Sig  Dispense  Refill  . acetaminophen (TYLENOL) 500 MG tablet   Oral   Take 500 mg by mouth daily as needed for mild pain or moderate pain.          Marland Kitchen albuterol (PROAIR HFA) 108 (90 BASE) MCG/ACT inhaler   Inhalation   Inhale 2 puffs into the lungs every 6 (six) hours as needed. For wheezing.         Marland Kitchen albuterol (PROVENTIL) (2.5 MG/3ML) 0.083% nebulizer solution   Nebulization   Take 3 mLs (2.5 mg total) by nebulization every 8 (eight) hours as needed for wheezing or shortness of breath.   75 mL   12   . amLODipine (NORVASC) 5 MG tablet   Oral   Take 1 tablet (5 mg total) by mouth daily.  90 tablet   4   . aspirin EC 81 MG tablet   Oral   Take 81 mg by mouth daily.         . benzonatate (TESSALON) 200 MG capsule   Oral   Take 200 mg by mouth 3 (three) times daily as needed for cough.         . Cholecalciferol (VITAMIN D3) 1000 UNITS CAPS   Oral   Take 1,000 Units by mouth daily.         . finasteride (PROSCAR) 5 MG tablet   Oral   Take 1 tablet (5 mg total) by mouth daily.   15 tablet   0   . fluticasone furoate-vilanterol (BREO ELLIPTA) 100-25 MCG/INH AEPB   Inhalation   Inhale 1 puff into the lungs daily. Reported on 10/19/2015         . FUROSEMIDE PO   Oral   Take 20 mg by mouth daily.          Marland Kitchen. lisinopril (PRINIVIL,ZESTRIL) 5 MG tablet   Oral   Take 5 mg by mouth daily.         Marland Kitchen.  loratadine (CLARITIN) 10 MG tablet   Oral   Take 10 mg by mouth daily.         . Melatonin 5 MG CAPS   Oral   Take 10 mg by mouth at bedtime.         . metoprolol tartrate (LOPRESSOR) 25 MG tablet   Oral   Take 25 mg by mouth 2 (two) times daily.         . nitroGLYCERIN (NITROSTAT) 0.4 MG SL tablet   Sublingual   Place 0.4 mg under the tongue every 5 (five) minutes as needed for chest pain. Reported on 10/19/2015         . pravastatin (PRAVACHOL) 80 MG tablet   Oral   Take 80 mg by mouth daily.         Marland Kitchen. sulfamethoxazole-trimethoprim (BACTRIM DS,SEPTRA DS) 800-160 MG tablet   Oral   Take 1 tablet by mouth 2 (two) times daily.   20 tablet   0   . tamsulosin (FLOMAX) 0.4 MG CAPS capsule   Oral   Take 0.4 mg by mouth daily. Take 30 minutes after same meal each day.         . tiotropium (SPIRIVA) 18 MCG inhalation capsule   Inhalation   Place 1 capsule (18 mcg total) into inhaler and inhale every evening.   30 capsule   12   . levofloxacin (LEVAQUIN) 250 MG tablet   Oral   Take 1 tablet (250 mg total) by mouth daily. Patient not taking: Reported on 10/19/2015   3 tablet   0   . predniSONE (DELTASONE) 10 MG tablet   Oral   Take 1 tablet (10 mg total) by mouth daily with breakfast. 60 mg PO (ORAL) x 2 days 50 mg PO (ORAL)  x 2 days 40 mg PO (ORAL)  x 2 days 30 mg PO  (ORAL)  x 2 days 20 mg PO  (ORAL) x 2 days 10 mg PO  (ORAL) x 2 days then stop Patient not taking: Reported on 10/19/2015   42 tablet   0     Label  & dispense according to the schedule below: ...     Allergies Cortisone; Dye fdc red; Peanut oil; Peanut-containing drug products; and Penicillins  Family History  Problem Relation Age of Onset  .  COPD Father     Social History Social History  Substance Use Topics  . Smoking status: Former Smoker -- 3.00 packs/day for 40 years    Types: Cigarettes  . Smokeless tobacco: None  . Alcohol Use: Yes     Comment: wine at night.      Review of Systems Constitutional: No fever/chills Eyes: No visual changes. ENT: No sore throat. Cardiovascular: Denies chest pain. Respiratory: Denies shortness of breath. Gastrointestinal: No abdominal pain.  No nausea, no vomiting.  No diarrhea.  No constipation. Genitourinary: Indwelling Foley catheter. Musculoskeletal: As above Skin: Negative for rash. Neurological: Negative for headaches, focal weakness or numbness.  10-point ROS otherwise negative.  ____________________________________________   PHYSICAL EXAM:  VITAL SIGNS: ED Triage Vitals  Enc Vitals Group     BP 10/24/15 1512 110/74 mmHg     Pulse Rate 10/24/15 1512 81     Resp 10/24/15 1512 16     Temp 10/24/15 1512 98 F (36.7 C)     Temp Source 10/24/15 1512 Oral     SpO2 10/24/15 1508 94 %     Weight 10/24/15 1512 135 lb 3.2 oz (61.326 kg)     Height 10/24/15 1512  (1.676 m)     Head Cir --      Peak Flow --      Pain Score 10/24/15 1545 9     Pain Loc --      Pain Edu? --      Excl. in GC? --     Constitutional: Alert and oriented. Well appearing and in no acute distress. Eyes: Conjunctivae are normal. PERRL. EOMI. Head: Atraumatic. Nose: No congestion/rhinnorhea. Mouth/Throat: Mucous membranes are moist.   Neck: No stridor.  No midline tenderness to palpation. No deformity or step-off. Cardiovascular: Normal rate, regular rhythm. Grossly normal heart sounds.   Respiratory: Normal respiratory effort.  No retractions. Lungs CTAB. Gastrointestinal: Soft and nontender. No distention. No CVA tenderness. Musculoskeletal: Tenderness palpation over the right posterior ileum without any deformity. 5 out of 5 strength to the left lower extremity but only 3 out of 5 strength to the right lower extremity because the patient says that when he flexes at the hip that he experiences a sharp pain located over the right ilium. Sensation is intact to light touch. There is stability to the pelvis. No tenderness  over the hips bilaterally. Able to passively range both sides without any issue. No tenderness over the knees. No deformity. Neurologic:  Normal speech and language. No gross focal neurologic deficits are appreciated. No gait instability. Skin:  Superficial skin tear 2 to the right upper extremity. The more significant skin tear is superficial but with a skin avulsion over the right lateral arm over area of about 4 x 6 cm. No induration. Mild tenderness to palpation. No surrounding erythema or pus. Left hand the dorsum and medially with another superficial skin tear about 2 cm. No active bleeding. No bony deformity underlying. Full range of motion of the bilateral hands without any issue or pain or restriction. Psychiatric: Mood and affect are normal. Speech and behavior are normal.  ____________________________________________   LABS (all labs ordered are listed, but only abnormal results are displayed)  Labs Reviewed - No data to display ____________________________________________  EKG   ____________________________________________  RADIOLOGY      CT Pelvis Wo Contrast (Final result) Result time: 10/24/15 17:46:51   Final result by Rad Results In Interface (10/24/15 17:46:51)   Narrative:   CLINICAL DATA:  Patient fell yesterday. Patient denies pain except for when he tries to move his right leg then he has pain in his lower back. Patient denies LOC or hitting head, states that he lost his footing and fell.  EXAM: CT PELVIS WITHOUT CONTRAST  TECHNIQUE: Multidetector CT imaging of the pelvis was performed following the standard protocol without intravenous contrast.  COMPARISON: 10/24/2015 pelvis  FINDINGS: Three Knowles pins traverse the right femoral neck. Femoral head is well seated in the acetabulum. There is no acute fracture or subluxation.  Bones appear osteopenic. There is a bone-on-bone within the right ilium. There is significant degenerative change  within the visualized portion of the lower lumbar spine, disc height loss identified at L4-5 and L5-S1. No new fractures are identified.  There is atherosclerotic calcification of the abdominal aorta. Moderate sigmoid diverticulosis. Patient has a Foley catheter. There is no free pelvic fluid. Moderate right abdominal wall lipoma incidentally noted.  IMPRESSION: 1. Postoperative changes of right hip. 2. No evidence for acute osseous abnormality. 3. Moderate atherosclerosis of the abdominal aorta.   Electronically Signed By: Norva Pavlov M.D. On: 10/24/2015 17:46          DG Pelvis 1-2 Views (Final result) Result time: 10/24/15 15:58:45   Final result by Rad Results In Interface (10/24/15 15:58:45)   Narrative:   CLINICAL DATA: 82 year old male with acute right hip pain following fall yesterday. Initial encounter. History of right hip fracture.  EXAM: PELVIS - 1-2 VIEW  COMPARISON: None.  FINDINGS: Screws within the proximal right femur are again noted.  There is no evidence of acute fracture, subluxation or dislocation.  Diffuse osteopenia is noted.  IMPRESSION: No evidence of acute bony abnormality.  Proximal right femur fixation screws again noted.   Electronically Signed By: Harmon Pier M.D. On: 10/24/2015 15:58          ____________________________________________   PROCEDURES   Procedures  ____________________________________________   INITIAL IMPRESSION / ASSESSMENT AND PLAN / ED COURSE  Pertinent labs & imaging results that were available during my care of the patient were reviewed by me and considered in my medical decision making (see chart for details).  ----------------------------------------- 7:06 PM on 10/24/2015 -----------------------------------------  Patient after initial sentinel then Percocet did not have any relief. Proceeded to CAT scan to rule out any occult fracture. CAT scan was then negative.  I then gave the patient Tresa Garter because he did not appear to be having any sedation with his pain medicines. After the [facility] he was able to ambulate with me with his assistance at what he says this is baseline. He'll be discharged to home. I believe from his fall he likely had muscle spasm. I will discharge him with the Soma. He did not express any sedation after this in the emergency department I believe it will be safe for him for discharge to home. ____________________________________________   FINAL CLINICAL IMPRESSION(S) / ED DIAGNOSES  Muscle spasm. Skin tears.    NEW MEDICATIONS STARTED DURING THIS VISIT:  New Prescriptions   No medications on file     Note:  This document was prepared using Dragon voice recognition software and may include unintentional dictation errors.    Myrna Blazer, MD 10/24/15 [phone]

## 2015-10-29 ENCOUNTER — Inpatient Hospital Stay
Admission: EM | Admit: 2015-10-29 | Discharge: 2015-11-02 | DRG: 682 | Disposition: A | Discharge status: Skilled Nursing Facility | Payer: Medicare PPO | Attending: Internal Medicine | Admitting: Internal Medicine

## 2015-10-29 ENCOUNTER — Emergency Department: Payer: Medicare PPO

## 2015-10-29 DIAGNOSIS — E782 Mixed hyperlipidemia: Secondary | ICD-10-CM | POA: Diagnosis present

## 2015-10-29 DIAGNOSIS — Z8674 Personal history of sudden cardiac arrest: Secondary | ICD-10-CM

## 2015-10-29 DIAGNOSIS — Z978 Presence of other specified devices: Secondary | ICD-10-CM

## 2015-10-29 DIAGNOSIS — F4310 Post-traumatic stress disorder, unspecified: Secondary | ICD-10-CM | POA: Diagnosis present

## 2015-10-29 DIAGNOSIS — Z955 Presence of coronary angioplasty implant and graft: Secondary | ICD-10-CM | POA: Diagnosis not present

## 2015-10-29 DIAGNOSIS — E860 Dehydration: Secondary | ICD-10-CM | POA: Diagnosis present

## 2015-10-29 DIAGNOSIS — J449 Chronic obstructive pulmonary disease, unspecified: Secondary | ICD-10-CM | POA: Diagnosis present

## 2015-10-29 DIAGNOSIS — Z6820 Body mass index (BMI) 20.0-20.9, adult: Secondary | ICD-10-CM | POA: Diagnosis not present

## 2015-10-29 DIAGNOSIS — R531 Weakness: Secondary | ICD-10-CM | POA: Diagnosis not present

## 2015-10-29 DIAGNOSIS — K5900 Constipation, unspecified: Secondary | ICD-10-CM | POA: Diagnosis present

## 2015-10-29 DIAGNOSIS — E861 Hypovolemia: Secondary | ICD-10-CM | POA: Diagnosis present

## 2015-10-29 DIAGNOSIS — R627 Adult failure to thrive: Secondary | ICD-10-CM | POA: Diagnosis present

## 2015-10-29 DIAGNOSIS — E875 Hyperkalemia: Secondary | ICD-10-CM | POA: Diagnosis present

## 2015-10-29 DIAGNOSIS — R319 Hematuria, unspecified: Secondary | ICD-10-CM

## 2015-10-29 DIAGNOSIS — N179 Acute kidney failure, unspecified: Secondary | ICD-10-CM | POA: Diagnosis not present

## 2015-10-29 DIAGNOSIS — F419 Anxiety disorder, unspecified: Secondary | ICD-10-CM | POA: Diagnosis present

## 2015-10-29 DIAGNOSIS — E222 Syndrome of inappropriate secretion of antidiuretic hormone: Secondary | ICD-10-CM | POA: Diagnosis present

## 2015-10-29 DIAGNOSIS — I252 Old myocardial infarction: Secondary | ICD-10-CM

## 2015-10-29 DIAGNOSIS — E43 Unspecified severe protein-calorie malnutrition: Secondary | ICD-10-CM | POA: Diagnosis present

## 2015-10-29 DIAGNOSIS — R338 Other retention of urine: Secondary | ICD-10-CM | POA: Diagnosis present

## 2015-10-29 DIAGNOSIS — Z960 Presence of urogenital implants: Secondary | ICD-10-CM

## 2015-10-29 DIAGNOSIS — E785 Hyperlipidemia, unspecified: Secondary | ICD-10-CM | POA: Diagnosis present

## 2015-10-29 DIAGNOSIS — N401 Enlarged prostate with lower urinary tract symptoms: Secondary | ICD-10-CM

## 2015-10-29 DIAGNOSIS — Z79899 Other long term (current) drug therapy: Secondary | ICD-10-CM

## 2015-10-29 DIAGNOSIS — Z87891 Personal history of nicotine dependence: Secondary | ICD-10-CM | POA: Diagnosis not present

## 2015-10-29 DIAGNOSIS — K224 Dyskinesia of esophagus: Secondary | ICD-10-CM | POA: Diagnosis present

## 2015-10-29 DIAGNOSIS — R1314 Dysphagia, pharyngoesophageal phase: Secondary | ICD-10-CM | POA: Diagnosis present

## 2015-10-29 DIAGNOSIS — R1310 Dysphagia, unspecified: Secondary | ICD-10-CM

## 2015-10-29 DIAGNOSIS — N390 Urinary tract infection, site not specified: Secondary | ICD-10-CM

## 2015-10-29 DIAGNOSIS — Z7982 Long term (current) use of aspirin: Secondary | ICD-10-CM

## 2015-10-29 DIAGNOSIS — I959 Hypotension, unspecified: Secondary | ICD-10-CM

## 2015-10-29 DIAGNOSIS — I2510 Atherosclerotic heart disease of native coronary artery without angina pectoris: Secondary | ICD-10-CM | POA: Diagnosis present

## 2015-10-29 LAB — COMPREHENSIVE METABOLIC PANEL
ALK PHOS: 41 U/L (ref 38–126)
ALT: 23 U/L (ref 17–63)
ANION GAP: 8 (ref 5–15)
AST: 32 U/L (ref 15–41)
Albumin: 3.2 g/dL — ABNORMAL LOW (ref 3.5–5.0)
BILIRUBIN TOTAL: 0.8 mg/dL (ref 0.3–1.2)
BUN: 40 mg/dL — ABNORMAL HIGH (ref 6–20)
CALCIUM: 8.9 mg/dL (ref 8.9–10.3)
CO2: 21 mmol/L — ABNORMAL LOW (ref 22–32)
Chloride: 99 mmol/L — ABNORMAL LOW (ref 101–111)
Creatinine, Ser: 2.14 mg/dL — ABNORMAL HIGH (ref 0.61–1.24)
GFR, EST AFRICAN AMERICAN: 27 mL/min — AB (ref >60)
GFR, EST NON AFRICAN AMERICAN: 24 mL/min — AB (ref >60)
GLUCOSE: 114 mg/dL — AB (ref 65–99)
POTASSIUM: 5.5 mmol/L — AB (ref 3.5–5.1)
Sodium: 128 mmol/L — ABNORMAL LOW (ref 135–145)
TOTAL PROTEIN: 5.7 g/dL — AB (ref 6.5–8.1)

## 2015-10-29 LAB — CBC WITH DIFFERENTIAL/PLATELET
BASOS ABS: 0.1 10*3/uL (ref 0–0.1)
BASOS PCT: 1 %
Eosinophils Absolute: 0.2 10*3/uL (ref 0–0.7)
Eosinophils Relative: 2 %
HEMATOCRIT: 39.1 % — AB (ref 40.0–52.0)
Hemoglobin: 13.6 g/dL (ref 13.0–18.0)
LYMPHS PCT: 12 %
Lymphs Abs: 1 10*3/uL (ref 1.0–3.6)
MCH: 34.5 pg — ABNORMAL HIGH (ref 26.0–34.0)
MCHC: 34.8 g/dL (ref 32.0–36.0)
MCV: 99 fL (ref 80.0–100.0)
MONO ABS: 0.5 10*3/uL (ref 0.2–1.0)
Monocytes Relative: 6 %
NEUTROS ABS: 6.9 10*3/uL — AB (ref 1.4–6.5)
Neutrophils Relative %: 79 %
PLATELETS: 140 10*3/uL — AB (ref 150–440)
RBC: 3.95 MIL/uL — AB (ref 4.40–5.90)
RDW: 14.1 % (ref 11.5–14.5)
WBC: 8.8 10*3/uL (ref 3.8–10.6)

## 2015-10-29 LAB — URINALYSIS COMPLETE WITH MICROSCOPIC (ARMC ONLY)
BACTERIA UA: NONE SEEN
Bilirubin Urine: NEGATIVE
Glucose, UA: NEGATIVE mg/dL
Ketones, ur: NEGATIVE mg/dL
Leukocytes, UA: NEGATIVE
NITRITE: NEGATIVE
PROTEIN: NEGATIVE mg/dL
SQUAMOUS EPITHELIAL / LPF: NONE SEEN
Specific Gravity, Urine: 1.023 (ref 1.005–1.030)
pH: 6 (ref 5.0–8.0)

## 2015-10-29 LAB — TROPONIN I

## 2015-10-29 MED ORDER — CARISOPRODOL 350 MG PO TABS
350.0000 mg | ORAL_TABLET | Freq: Three times a day (TID) | ORAL | Status: DC | PRN
  Administered 2015-10-30 – 2015-11-01 (×2): 350 mg via ORAL
  Filled 2015-10-29 (×2): qty 1

## 2015-10-29 MED ORDER — SODIUM CHLORIDE 0.9 % IV SOLN
Freq: Once | INTRAVENOUS | Status: AC
  Administered 2015-10-29: 18:00:00 via INTRAVENOUS

## 2015-10-29 MED ORDER — PRAVASTATIN SODIUM 40 MG PO TABS
80.0000 mg | ORAL_TABLET | Freq: Every day | ORAL | Status: DC
  Administered 2015-10-30 – 2015-11-01 (×3): 80 mg via ORAL
  Filled 2015-10-29 (×4): qty 2

## 2015-10-29 MED ORDER — ONDANSETRON HCL 4 MG PO TABS: 4.0000 mg | ORAL_TABLET | Freq: Four times a day (QID) | ORAL | Status: DC | PRN

## 2015-10-29 MED ORDER — CARISOPRODOL 350 MG PO TABS: 350.0000 mg | ORAL_TABLET | Freq: Three times a day (TID) | ORAL | Status: DC

## 2015-10-29 MED ORDER — BENZONATATE 100 MG PO CAPS: 200.0000 mg | ORAL_CAPSULE | Freq: Three times a day (TID) | ORAL | Status: DC | PRN

## 2015-10-29 MED ORDER — NITROGLYCERIN 0.4 MG SL SUBL: 0.4000 mg | SUBLINGUAL_TABLET | SUBLINGUAL | Status: DC | PRN

## 2015-10-29 MED ORDER — ALBUTEROL SULFATE (2.5 MG/3ML) 0.083% IN NEBU: 3.0000 mL | INHALATION_SOLUTION | Freq: Four times a day (QID) | RESPIRATORY_TRACT | Status: DC | PRN

## 2015-10-29 MED ORDER — MELATONIN 5 MG PO TABS
10.0000 mg | ORAL_TABLET | Freq: Every day | ORAL | Status: DC
  Administered 2015-10-29 – 2015-11-01 (×4): 10 mg via ORAL
  Filled 2015-10-29 (×5): qty 2

## 2015-10-29 MED ORDER — ACETAMINOPHEN 325 MG PO TABS
650.0000 mg | ORAL_TABLET | Freq: Four times a day (QID) | ORAL | Status: DC | PRN
  Administered 2015-10-29 – 2015-11-02 (×4): 650 mg via ORAL
  Filled 2015-10-29 (×5): qty 2

## 2015-10-29 MED ORDER — SODIUM CHLORIDE 0.9 % IV SOLN
INTRAVENOUS | Status: DC
  Administered 2015-10-29 – 2015-10-31 (×4): via INTRAVENOUS

## 2015-10-29 MED ORDER — TAMSULOSIN HCL 0.4 MG PO CAPS
0.4000 mg | ORAL_CAPSULE | Freq: Every day | ORAL | Status: DC
Start: 2015-10-29 — End: 2015-11-02
  Administered 2015-10-30 – 2015-11-01 (×3): 0.4 mg via ORAL
  Filled 2015-10-29 (×4): qty 1

## 2015-10-29 MED ORDER — HEPARIN SODIUM (PORCINE) 5000 UNIT/ML IJ SOLN
5000.0000 [IU] | Freq: Three times a day (TID) | INTRAMUSCULAR | Status: DC
  Administered 2015-10-30 – 2015-11-02 (×9): 5000 [IU] via SUBCUTANEOUS
  Filled 2015-10-29 (×11): qty 1

## 2015-10-29 MED ORDER — TIOTROPIUM BROMIDE MONOHYDRATE 18 MCG IN CAPS
18.0000 ug | ORAL_CAPSULE | Freq: Every evening | RESPIRATORY_TRACT | Status: DC
  Administered 2015-10-30 – 2015-11-01 (×3): 18 ug via RESPIRATORY_TRACT
  Filled 2015-10-29: qty 5

## 2015-10-29 MED ORDER — FLUTICASONE FUROATE-VILANTEROL 100-25 MCG/INH IN AEPB
1.0000 | INHALATION_SPRAY | Freq: Every day | RESPIRATORY_TRACT | Status: DC
  Administered 2015-10-30 – 2015-11-02 (×4): 1 via RESPIRATORY_TRACT
  Filled 2015-10-29: qty 28

## 2015-10-29 MED ORDER — ONDANSETRON HCL 4 MG/2ML IJ SOLN: 4.0000 mg | Freq: Four times a day (QID) | INTRAMUSCULAR | Status: DC | PRN

## 2015-10-29 MED ORDER — ACETAMINOPHEN 650 MG RE SUPP: 650.0000 mg | Freq: Four times a day (QID) | RECTAL | Status: DC | PRN

## 2015-10-29 MED ORDER — FINASTERIDE 5 MG PO TABS
5.0000 mg | ORAL_TABLET | Freq: Every day | ORAL | Status: DC
Start: 2015-10-29 — End: 2015-11-02
  Administered 2015-10-31 – 2015-11-02 (×3): 5 mg via ORAL
  Filled 2015-10-29 (×3): qty 1

## 2015-10-29 MED ORDER — ALBUTEROL SULFATE (2.5 MG/3ML) 0.083% IN NEBU: 2.5000 mg | INHALATION_SOLUTION | Freq: Three times a day (TID) | RESPIRATORY_TRACT | Status: DC | PRN

## 2015-10-29 NOTE — ED Notes (Signed)
Patient states "i am not eating or drinking anything"  For the past 3-4 days.  Patient describes lack of appetitive and c/o esophageal pain when swallowing.  Patient states he was recently seen through the ED for similar complaints.  Sent to ED from Dr. Quillian QuinceBliss' office for possible admission for dehydration.

## 2015-10-29 NOTE — ED Provider Notes (Signed)
[facility] Emergency Department Provider Note  ____________________________________________   I have reviewed the triage vital signs and the nursing notes.   HISTORY  Chief Complaint Anorexia    HPI Daniel Dickson is a 82 y.o. male presents today complaining of dehydration. He states that he has not had anything to eat or drink for 3 days because when he swallows it is uncomfortable. He has had this for "a year". He does not recall ever having an EGD for this. He states that at this time he has no pain he only has discomfort when he is x-ray swallowing liquids or solids. He states he has had no fever no chills no nausea no vomiting or chest pain. Patient is remarkable we will appearing for his age. He states that he finally became somewhat weak because he was not eating and drinking and came in for that reason.Patient sometimes states "it's just that I don't have an appetite" so the reason for his reluctance to eat is not clear.    Past Medical History  Diagnosis Date  . Hypertensive heart disease   . PTSD (post-traumatic stress disorder)     with headaches  . Mixed hyperlipidemia   . Coronary artery disease     a. 2012 s/p MI/cardiac arrest--> PCI mRCA.  Marland Kitchen COPD (chronic obstructive pulmonary disease) (HCC)   . Anxiety     Patient Active Problem List   Diagnosis Date Noted  . Dyspnea 10/07/2015  . Hyponatremia 06/06/2015  . Coronary artery disease   . Hypertensive heart disease   . Mixed hyperlipidemia   . COPD (chronic obstructive pulmonary disease) (HCC)   . COPD exacerbation (HCC) 01/11/2015  . Moderate COPD (chronic obstructive pulmonary disease) (HCC) 12/08/2014  . Fall 09/03/2014  . Hip fracture (HCC) 09/03/2014  . PTSD (post-traumatic stress disorder) 09/03/2014  . Bilateral leg edema 09/03/2014  . SOB (shortness of breath) 10/31/2012  . Essential hypertension 10/31/2012  . CAD (coronary artery disease) 10/31/2012  .  Hyperlipidemia 10/31/2012  . Tachycardia 10/31/2012    Past Surgical History  Procedure Laterality Date  . Hemorrhoid surgery    . Cholecystectomy    . Cataract extraction    . Vascular surgery      stent R femoral artery  . Coronary angioplasty      with stent; [facility]  . Hip surgery    . Hand surgery      Current Outpatient Rx  Name  Route  Sig  Dispense  Refill  . acetaminophen (TYLENOL) 500 MG tablet   Oral   Take 500 mg by mouth daily as needed for mild pain or moderate pain.          Marland Kitchen albuterol (PROAIR HFA) 108 (90 BASE) MCG/ACT inhaler   Inhalation   Inhale 2 puffs into the lungs every 6 (six) hours as needed. For wheezing.         Marland Kitchen albuterol (PROVENTIL) (2.5 MG/3ML) 0.083% nebulizer solution   Nebulization   Take 3 mLs (2.5 mg total) by nebulization every 8 (eight) hours as needed for wheezing or shortness of breath.   75 mL   12   . amLODipine (NORVASC) 5 MG tablet   Oral   Take 1 tablet (5 mg total) by mouth daily.   90 tablet   4   . aspirin EC 81 MG tablet   Oral   Take 81 mg by mouth daily.         . benzonatate (TESSALON)  200 MG capsule   Oral   Take 200 mg by mouth 3 (three) times daily as needed for cough.         . carisoprodol (SOMA) 350 MG tablet   Oral   Take 1 tablet (350 mg total) by mouth 3 (three) times daily as needed for muscle spasms.   15 tablet   0   . Cholecalciferol (VITAMIN D3) 1000 UNITS CAPS   Oral   Take 1,000 Units by mouth daily.         . finasteride (PROSCAR) 5 MG tablet   Oral   Take 1 tablet (5 mg total) by mouth daily.   15 tablet   0   . fluticasone furoate-vilanterol (BREO ELLIPTA) 100-25 MCG/INH AEPB   Inhalation   Inhale 1 puff into the lungs daily. Reported on 10/19/2015         . FUROSEMIDE PO   Oral   Take 20 mg by mouth daily.          Marland Kitchen. levofloxacin (LEVAQUIN) 250 MG tablet   Oral   Take 1 tablet (250 mg total) by mouth daily. Patient not taking: Reported on 10/19/2015   3 tablet    0   . lisinopril (PRINIVIL,ZESTRIL) 5 MG tablet   Oral   Take 5 mg by mouth daily.         Marland Kitchen. loratadine (CLARITIN) 10 MG tablet   Oral   Take 10 mg by mouth daily.         . Melatonin 5 MG CAPS   Oral   Take 10 mg by mouth at bedtime.         . metoprolol tartrate (LOPRESSOR) 25 MG tablet   Oral   Take 25 mg by mouth 2 (two) times daily.         . nitroGLYCERIN (NITROSTAT) 0.4 MG SL tablet   Sublingual   Place 0.4 mg under the tongue every 5 (five) minutes as needed for chest pain. Reported on 10/19/2015         . pravastatin (PRAVACHOL) 80 MG tablet   Oral   Take 80 mg by mouth daily.         . predniSONE (DELTASONE) 10 MG tablet   Oral   Take 1 tablet (10 mg total) by mouth daily with breakfast. 60 mg PO (ORAL) x 2 days 50 mg PO (ORAL)  x 2 days 40 mg PO (ORAL)  x 2 days 30 mg PO  (ORAL)  x 2 days 20 mg PO  (ORAL) x 2 days 10 mg PO  (ORAL) x 2 days then stop Patient not taking: Reported on 10/19/2015   42 tablet   0     Label  & dispense according to the schedule below: ...   . sulfamethoxazole-trimethoprim (BACTRIM DS,SEPTRA DS) 800-160 MG tablet   Oral   Take 1 tablet by mouth 2 (two) times daily.   20 tablet   0   . tamsulosin (FLOMAX) 0.4 MG CAPS capsule   Oral   Take 0.4 mg by mouth daily. Take 30 minutes after same meal each day.         . tiotropium (SPIRIVA) 18 MCG inhalation capsule   Inhalation   Place 1 capsule (18 mcg total) into inhaler and inhale every evening.   30 capsule   12     Allergies Cortisone; Dye fdc red; Peanut oil; Peanut-containing drug products; and Penicillins  Family History  Problem Relation Age of Onset  .  COPD Father     Social History Social History  Substance Use Topics  . Smoking status: Former Smoker -- 3.00 packs/day for 40 years    Types: Cigarettes  . Smokeless tobacco: Not on file  . Alcohol Use: Yes     Comment: wine at night.     Review of Systems Constitutional: No  fever/chills Eyes: No visual changes. ENT: No sore throat. No stiff neck no neck pain Cardiovascular: Denies chest pain. Respiratory: Denies shortness of breath. Gastrointestinal:   no vomiting.  No diarrhea.  No constipation. Genitourinary: Negative for dysuria. Musculoskeletal: Negative lower extremity swelling Skin: Negative for rash. Neurological: Negative for headaches, focal weakness or numbness. 10-point ROS otherwise negative.  ____________________________________________   PHYSICAL EXAM:  VITAL SIGNS: ED Triage Vitals  Enc Vitals Group     BP 10/29/15 1703 72/40 mmHg     Pulse Rate 10/29/15 1703 74     Resp 10/29/15 1703 16     Temp 10/29/15 1703 97.7 F (36.5 C)     Temp Source 10/29/15 1703 Oral     SpO2 10/29/15 1703 94 %     Weight 10/29/15 1703 116 lb (52.617 kg)     Height 10/29/15 1703  (1.676 m)     Head Cir --      Peak Flow --      Pain Score 10/29/15 1832 0     Pain Loc --      Pain Edu? --      Excl. in GC? --     Constitutional: Alert and oriented. Well appearing and in no acute distress. Eyes: Conjunctivae are normal. PERRL. EOMI. Head: Atraumatic. Nose: No congestion/rhinnorhea. Mouth/Throat: Mucous membranes are moist.  Oropharynx non-erythematous. Neck: No stridor.   Nontender with no meningismus Cardiovascular: Normal rate, regular rhythm. Grossly normal heart sounds.  Good peripheral circulation. Respiratory: Normal respiratory effort.  No retractions. Lungs CTAB. Abdominal: Soft and nontender. No distention. No guarding no rebound Back:  There is no focal tenderness or step off there is no midline tenderness there are no lesions noted. there is no CVA tenderness Musculoskeletal: No lower extremity tenderness. No joint effusions, no DVT signs strong distal pulses no edema Neurologic:  Normal speech and language. No gross focal neurologic deficits are appreciated.  Skin:  Skin is warm, dry and intact. No rash noted. Psychiatric: Mood  and affect are normal. Speech and behavior are normal.  ____________________________________________   LABS (all labs ordered are listed, but only abnormal results are displayed)  Labs Reviewed  COMPREHENSIVE METABOLIC PANEL - Abnormal; Notable for the following:    Sodium 128 (*)    Potassium 5.5 (*)    Chloride 99 (*)    CO2 21 (*)    Glucose, Bld 114 (*)    BUN 40 (*)    Creatinine, Ser 2.14 (*)    Total Protein 5.7 (*)    Albumin 3.2 (*)    GFR calc non Af Amer 24 (*)    GFR calc Af Amer 27 (*)    All other components within normal limits  CBC WITH DIFFERENTIAL/PLATELET - Abnormal; Notable for the following:    RBC 3.95 (*)    HCT 39.1 (*)    MCH 34.5 (*)    Platelets 140 (*)    Neutro Abs 6.9 (*)    All other components within normal limits  URINALYSIS COMPLETEWITH MICROSCOPIC ([facility] ONLY) - Abnormal; Notable for the following:    Color, Urine AMBER (*)  APPearance CLEAR (*)    Hgb urine dipstick 1+ (*)    All other components within normal limits  URINE CULTURE  TROPONIN I   ____________________________________________  EKG  I personally interpreted any EKGs ordered by me or triage Normal sinus rhythm at 83 bpm no acute ST elevation or depression nonspecific ST changes ____________________________________________  RADIOLOGY  I reviewed any imaging ordered by me or triage that were performed during my shift and, if possible, patient and/or family made aware of any abnormal findings. ____________________________________________   PROCEDURES  Procedure(s) performed: None  Critical Care performed: None  ____________________________________________   INITIAL IMPRESSION / ASSESSMENT AND PLAN / ED COURSE  Pertinent labs & imaging results that were available during my care of the patient were reviewed by me and considered in my medical decision making (see chart for details).  Patient with significant dehydration and a very high heat headaches today, he  very rapidly responded to IV fluids blood pressure is 123/74 this time, oropharynx is clear. Chest x-ray does not show any acute abnormality. Patient is 82 years old. We will admit him for IV hydration as he has had evidence of acute renal injury from this and he will likely require further evaluation of his dehydration. ____________________________________________   FINAL CLINICAL IMPRESSION(S) / ED DIAGNOSES  Final diagnoses:  None      This chart was dictated using voice recognition software.  Despite best efforts to proofread,  errors can occur which can change meaning.     Jeanmarie Plant, MD 10/29/15 [phone]

## 2015-10-29 NOTE — ED Notes (Signed)
Pt at Xray at this time 

## 2015-10-29 NOTE — H&P (Signed)
[facility] - [place] at [facility]   PATIENT NAME: Daniel Dickson    MR#:  [id number]  DATE OF BIRTH:  1913/01/25  DATE OF ADMISSION:  10/29/2015  PRIMARY CARE PHYSICIAN: [name], MD   REQUESTING/REFERRING PHYSICIAN: Dr. Ileana Roup  CHIEF COMPLAINT:   Chief Complaint  Patient presents with  . Anorexia    HISTORY OF PRESENT ILLNESS:  Daniel Dickson  is a 82 y.o. male with a known history of Posttraumatic stress disorder, hypertension, coronary artery disease, COPD not on home oxygen, urinary retention with recent indwelling Foley catheter presents to the hospital for weakness.  Patient states his symptoms actually has been going on for 6 months now. He has had previous ER visits for the same. He expresses swallowing difficulty. Decreased appetite and even after swallowing he feels that he has midsternal pain while the foot is going down. Never had an EGD for the same. He has been on Bactrim and Levaquin recently as outpatient, he is not sure why. His labs indicated that he is hyponatremic, hyperkalemic and with acute renal failure. So he is being admitted for the same.  PAST MEDICAL HISTORY:   Past Medical History  Diagnosis Date  . Hypertensive heart disease   . PTSD (post-traumatic stress disorder)     with headaches  . Mixed hyperlipidemia   . Coronary artery disease     a. 2012 s/p MI/cardiac arrest--> PCI mRCA.  Marland Kitchen COPD (chronic obstructive pulmonary disease) (HCC)   . Anxiety     PAST SURGICAL HISTORY:   Past Surgical History  Procedure Laterality Date  . Hemorrhoid surgery    . Cholecystectomy    . Cataract extraction    . Vascular surgery      stent R femoral artery  . Coronary angioplasty      with stent; [facility]  . Hip surgery    . Hand surgery      SOCIAL HISTORY:   Social History  Substance Use Topics  . Smoking status: Former Smoker -- 3.00 packs/day for 40 years    Types: Cigarettes  . Smokeless tobacco: Not on file   . Alcohol Use: Yes     Comment: wine at night.     FAMILY HISTORY:   Family History  Problem Relation Age of Onset  . COPD Father     DRUG ALLERGIES:   Allergies  Allergen Reactions  . Cortisone Other (See Comments)    Unknown reaction  . Dye Fdc Red [Red Dye] Other (See Comments)    Unknown reaction.  . Peanut Oil     Other reaction(s): Unknown  . Peanut-Containing Drug Products     Other reaction(s): Unknown  . Penicillins     patient not sure of reaction    REVIEW OF SYSTEMS:   Review of Systems  Constitutional: Positive for weight loss and malaise/fatigue. Negative for fever and chills.  HENT: Negative for ear discharge, ear pain, hearing loss and nosebleeds.   Eyes: Negative for blurred vision, double vision and photophobia.  Respiratory: Positive for cough. Negative for hemoptysis, shortness of breath and wheezing.   Cardiovascular: Negative for chest pain, palpitations, orthopnea and leg swelling.  Gastrointestinal: Positive for nausea. Negative for heartburn, vomiting, abdominal pain, diarrhea, constipation and melena.  Genitourinary: Negative for dysuria, urgency and frequency.       Retention  Musculoskeletal: Positive for myalgias and back pain. Negative for neck pain.  Skin: Negative for rash.  Neurological: Positive for dizziness and weakness.  Negative for tremors, sensory change, speech change, focal weakness and headaches.  Endo/Heme/Allergies: Does not bruise/bleed easily.  Psychiatric/Behavioral: Negative for depression. The patient is nervous/anxious.     MEDICATIONS AT HOME:   Prior to Admission medications   Medication Sig Start Date End Date Taking? Authorizing Provider  acetaminophen (TYLENOL) 500 MG tablet Take 500 mg by mouth daily as needed for mild pain or moderate pain.     Historical Provider, MD  albuterol (PROAIR HFA) 108 (90 BASE) MCG/ACT inhaler Inhale 2 puffs into the lungs every 6 (six) hours as needed. For wheezing.    Historical  Provider, MD  albuterol (PROVENTIL) (2.5 MG/3ML) 0.083% nebulizer solution Take 3 mLs (2.5 mg total) by nebulization every 8 (eight) hours as needed for wheezing or shortness of breath. 10/09/15   Adrian SaranSital Mody, MD  amLODipine (NORVASC) 5 MG tablet Take 1 tablet (5 mg total) by mouth daily. 09/03/14   Antonieta Ibaimothy J Gollan, MD  aspirin EC 81 MG tablet Take 81 mg by mouth daily.    Historical Provider, MD  benzonatate (TESSALON) 200 MG capsule Take 200 mg by mouth 3 (three) times daily as needed for cough.    Historical Provider, MD  carisoprodol (SOMA) 350 MG tablet Take 1 tablet (350 mg total) by mouth 3 (three) times daily as needed for muscle spasms. 10/24/15   Myrna Blazeravid Matthew Schaevitz, MD  Cholecalciferol (VITAMIN D3) 1000 UNITS CAPS Take 1,000 Units by mouth daily.    Historical Provider, MD  finasteride (PROSCAR) 5 MG tablet Take 1 tablet (5 mg total) by mouth daily. 10/20/15 10/19/16  Nita Sicklearolina Veronese, MD  fluticasone furoate-vilanterol (BREO ELLIPTA) 100-25 MCG/INH AEPB Inhale 1 puff into the lungs daily. Reported on 10/19/2015    Historical Provider, MD  FUROSEMIDE PO Take 20 mg by mouth daily.     Historical Provider, MD  levofloxacin (LEVAQUIN) 250 MG tablet Take 1 tablet (250 mg total) by mouth daily. Patient not taking: Reported on 10/19/2015 10/09/15   Adrian SaranSital Mody, MD  lisinopril (PRINIVIL,ZESTRIL) 5 MG tablet Take 5 mg by mouth daily.    Historical Provider, MD  loratadine (CLARITIN) 10 MG tablet Take 10 mg by mouth daily.    Historical Provider, MD  Melatonin 5 MG CAPS Take 10 mg by mouth at bedtime.    Historical Provider, MD  metoprolol tartrate (LOPRESSOR) 25 MG tablet Take 25 mg by mouth 2 (two) times daily.    Historical Provider, MD  nitroGLYCERIN (NITROSTAT) 0.4 MG SL tablet Place 0.4 mg under the tongue every 5 (five) minutes as needed for chest pain. Reported on 10/19/2015    Historical Provider, MD  pravastatin (PRAVACHOL) 80 MG tablet Take 80 mg by mouth daily.    Historical Provider, MD   predniSONE (DELTASONE) 10 MG tablet Take 1 tablet (10 mg total) by mouth daily with breakfast. 60 mg PO (ORAL) x 2 days 50 mg PO (ORAL)  x 2 days 40 mg PO (ORAL)  x 2 days 30 mg PO  (ORAL)  x 2 days 20 mg PO  (ORAL) x 2 days 10 mg PO  (ORAL) x 2 days then stop Patient not taking: Reported on 10/19/2015 10/09/15   Adrian SaranSital Mody, MD  sulfamethoxazole-trimethoprim (BACTRIM DS,SEPTRA DS) 800-160 MG tablet Take 1 tablet by mouth 2 (two) times daily. 10/20/15 10/29/15  Nita Sicklearolina Veronese, MD  tamsulosin (FLOMAX) 0.4 MG CAPS capsule Take 0.4 mg by mouth daily. Take 30 minutes after same meal each day.    Historical Provider, MD  tiotropium (  SPIRIVA) 18 MCG inhalation capsule Place 1 capsule (18 mcg total) into inhaler and inhale every evening. 10/09/15 10/08/16  Adrian Saran, MD      VITAL SIGNS:  Blood pressure 123/74, pulse 80, temperature 97.7 F (36.5 C), temperature source Oral, resp. rate 18, height 5\' 6"  (1.676 m), weight 52.617 kg (116 lb), SpO2 92 %.  PHYSICAL EXAMINATION:   Physical Exam  GENERAL:  82 y.o.-year-old elderly patient lying in the bed with no acute distress.  EYES: Pupils equal, round, reactive to light and accommodation. No scleral icterus. Extraocular muscles intact.  HEENT: Head atraumatic, normocephalic. Oropharynx and nasopharynx clear.  NECK:  Supple, no jugular venous distention. No thyroid enlargement, no tenderness.  LUNGS: Normal breath sounds bilaterally, no wheezing, rales,rhonchi or crepitation. No use of accessory muscles of respiration. Decreased bibasilar breath sounds  CARDIOVASCULAR: S1, S2 normal. No rubs, or gallops. 3/6 systolic murmur present. ABDOMEN: Soft, nontender, nondistended. Bowel sounds present. No organomegaly or mass.  EXTREMITIES: No pedal edema, cyanosis, or clubbing.  NEUROLOGIC: Cranial nerves II through XII are intact. Muscle strength 5/5 in all extremities. Sensation intact. Gait not checked.  PSYCHIATRIC: The patient is alert and oriented x  3.  SKIN: No obvious rash, lesion, or ulcer.   LABORATORY PANEL:   CBC  Recent Labs Lab 10/29/15 1716  WBC 8.8  HGB 13.6  HCT 39.1*  PLT 140*   ------------------------------------------------------------------------------------------------------------------  Chemistries   Recent Labs Lab 10/29/15 1716  NA 128*  K 5.5*  CL 99*  CO2 21*  GLUCOSE 114*  BUN 40*  CREATININE 2.14*  CALCIUM 8.9  AST 32  ALT 23  ALKPHOS 41  BILITOT 0.8   ------------------------------------------------------------------------------------------------------------------  Cardiac Enzymes  Recent Labs Lab 10/29/15 1716  TROPONINI <0.03   ------------------------------------------------------------------------------------------------------------------  RADIOLOGY:  Dg Chest 2 View  10/29/2015  CLINICAL DATA:  Dysphagia.  Hypertension. EXAM: CHEST  2 VIEW COMPARISON:  10/07/2015 and 06/06/2015 FINDINGS: Lungs are adequately inflated demonstrate small posterior layering pleural effusions bilaterally. No focal airspace process. Cardiomediastinal silhouette is within normal. There is calcified plaque over the thoracic aorta. There are degenerative changes of the spine. IMPRESSION: Small bilateral pleural effusions. Aortic atherosclerosis. Electronically Signed   By: Elberta Fortis M.D.   On: 10/29/2015 18:33    EKG:   Orders placed or performed during the hospital encounter of 10/29/15  . EKG 12-Lead  . EKG 12-Lead    IMPRESSION AND PLAN:   Daniel Dickson  is a 82 y.o. male with a known history of Posttraumatic stress disorder, hypertension, coronary artery disease, COPD not on home oxygen, urinary retention with recent indwelling Foley catheter presents to the hospital for weakness.  #1 acute renal failure-could be prerenal, IV fluids - also discontinue bactrim - Monitor, if no improvement by tomorrow- order renal US - has a foley catheter already, measure I&O  #2 Hyperkalemia-  discontinue bactrim, lisinopril -Monitor with fluids.  #3 hypertension-secondary to hypovolemia. Improved with IV fluid resuscitation. -Hold metoprolol, Norvasc, lisinopril and Lasix  #4 dysphagia-GI consult. Going on for 6 months, will order barium swallow for tomorrow morning. Dietitian consult  #5 weakness- physical therapy consult  #6 DVT Prophylaxis- on SQ heparin   All the records are reviewed and case discussed with ED provider. Management plans discussed with the patient, family and they are in agreement.  CODE STATUS: Full Code  TOTAL TIME TAKING CARE OF THIS PATIENT: 38 minutes.    Enid Baas M.D on 10/29/2015 at 8:19 PM  Between 7am to 6pm -  Pager - [phone]  After 6pm go to [email or web address] - password [email or web address] [facility]  [place] [facility]  Office  [phone]  CC: Primary care physician; [name], MD

## 2015-10-30 ENCOUNTER — Encounter: Payer: Self-pay | Admitting: *Deleted

## 2015-10-30 ENCOUNTER — Inpatient Hospital Stay: Payer: Medicare PPO

## 2015-10-30 DIAGNOSIS — E43 Unspecified severe protein-calorie malnutrition: Secondary | ICD-10-CM | POA: Insufficient documentation

## 2015-10-30 LAB — CBC
HCT: 36.6 % — ABNORMAL LOW (ref 40.0–52.0)
Hemoglobin: 12.6 g/dL — ABNORMAL LOW (ref 13.0–18.0)
MCH: 34.1 pg — ABNORMAL HIGH (ref 26.0–34.0)
MCHC: 34.6 g/dL (ref 32.0–36.0)
MCV: 98.5 fL (ref 80.0–100.0)
PLATELETS: 112 10*3/uL — AB (ref 150–440)
RBC: 3.71 MIL/uL — AB (ref 4.40–5.90)
RDW: 14 % (ref 11.5–14.5)
WBC: 6.2 10*3/uL (ref 3.8–10.6)

## 2015-10-30 LAB — BASIC METABOLIC PANEL
Anion gap: 7 (ref 5–15)
BUN: 37 mg/dL — ABNORMAL HIGH (ref 6–20)
CALCIUM: 8.4 mg/dL — AB (ref 8.9–10.3)
CHLORIDE: 105 mmol/L (ref 101–111)
CO2: 21 mmol/L — ABNORMAL LOW (ref 22–32)
CREATININE: 1.73 mg/dL — AB (ref 0.61–1.24)
GFR, EST AFRICAN AMERICAN: 35 mL/min — AB (ref >60)
GFR, EST NON AFRICAN AMERICAN: 30 mL/min — AB (ref >60)
Glucose, Bld: 83 mg/dL (ref 65–99)
Potassium: 4.9 mmol/L (ref 3.5–5.1)
SODIUM: 133 mmol/L — AB (ref 135–145)

## 2015-10-30 MED ORDER — MUPIROCIN CALCIUM 2 % EX CREA
TOPICAL_CREAM | Freq: Every day | CUTANEOUS | Status: DC
  Administered 2015-10-30 – 2015-11-02 (×4): via TOPICAL
  Filled 2015-10-30: qty 15

## 2015-10-30 NOTE — Progress Notes (Signed)
CSW was consulted for home issues. CSW reviewed patient's chart. PT is pending. CSW will follow patient to determine discharge needs.   Woodroe Modehristina Jaleel Allen, MSW, LCSW-A, LCAS-A Clinical Social Worker [phone]

## 2015-10-30 NOTE — Progress Notes (Addendum)
[facility] - [place] at [facility]   PATIENT NAME: Rogue BussingGeorge Vitanza    MR#:  [id number]  DATE OF BIRTH:  12/31/1912  SUBJECTIVE:  CHIEF COMPLAINT:   Chief Complaint  Patient presents with  . Anorexia   Anorexia and weakness. REVIEW OF SYSTEMS:  Review of Systems  Constitutional: Positive for weight loss and malaise/fatigue. Negative for fever and chills.  HENT: Negative for congestion.   Eyes: Negative for blurred vision and double vision.  Respiratory: Negative for cough, hemoptysis, sputum production, shortness of breath and wheezing.   Cardiovascular: Negative for chest pain, palpitations, orthopnea and leg swelling.  Gastrointestinal: Negative for heartburn, nausea, vomiting and abdominal pain.  Genitourinary: Negative for dysuria, urgency and frequency.  Musculoskeletal: Negative for joint pain.  Neurological: Positive for weakness. Negative for dizziness, sensory change, speech change, focal weakness, seizures, loss of consciousness and headaches.  Psychiatric/Behavioral: Negative for depression. The patient is not nervous/anxious.     DRUG ALLERGIES:   Allergies  Allergen Reactions  . Cortisone Other (See Comments)    Unknown reaction  . Dye Fdc Red [Red Dye] Other (See Comments)    Unknown reaction.  . Peanut Oil     Other reaction(s): Unknown  . Peanut-Containing Drug Products     Other reaction(s): Unknown  . Penicillins     patient not sure of reaction   VITALS:  Blood pressure 128/68, pulse 88, temperature 98 F (36.7 C), temperature source Oral, resp. rate 18, height 5\' 6"  (1.676 m), weight 127 lb 1.6 oz (57.652 kg), SpO2 97 %. PHYSICAL EXAMINATION:  Physical Exam  Constitutional: He is oriented to person, place, and time. No distress.  HENT:  Head: Normocephalic.  Mouth/Throat: Oropharynx is clear and moist.  Eyes: Conjunctivae and EOM are normal.  Neck: No JVD present. No thyromegaly present.  Cardiovascular: Normal rate, regular  rhythm and normal heart sounds.   No murmur heard. Pulmonary/Chest: Effort normal and breath sounds normal. He has no wheezes. He has no rales.  Abdominal: Soft. Bowel sounds are normal. He exhibits no distension. There is no tenderness.  Musculoskeletal: Normal range of motion. He exhibits no edema or tenderness.  Neurological: He is alert and oriented to person, place, and time. No cranial nerve deficit.  Skin: No rash noted. No erythema.  Psychiatric: Mood, affect and judgment normal.   LABORATORY PANEL:   CBC  Recent Labs Lab 10/30/15 0440  WBC 6.2  HGB 12.6*  HCT 36.6*  PLT 112*   ------------------------------------------------------------------------------------------------------------------ Chemistries   Recent Labs Lab 10/29/15 1716 10/30/15 0440  NA 128* 133*  K 5.5* 4.9  CL 99* 105  CO2 21* 21*  GLUCOSE 114* 83  BUN 40* 37*  CREATININE 2.14* 1.73*  CALCIUM 8.9 8.4*  AST 32  --   ALT 23  --   ALKPHOS 41  --   BILITOT 0.8  --    RADIOLOGY:  Dg Chest 2 View  10/29/2015  CLINICAL DATA:  Dysphagia.  Hypertension. EXAM: CHEST  2 VIEW COMPARISON:  10/07/2015 and 06/06/2015 FINDINGS: Lungs are adequately inflated demonstrate small posterior layering pleural effusions bilaterally. No focal airspace process. Cardiomediastinal silhouette is within normal. There is calcified plaque over the thoracic aorta. There are degenerative changes of the spine. IMPRESSION: Small bilateral pleural effusions. Aortic atherosclerosis. Electronically Signed   By: Elberta Fortisaniel  Boyle M.D.   On: 10/29/2015 18:33   Dg Esophagus  10/30/2015  CLINICAL DATA:  Loss of appetite EXAM: ESOPHOGRAM/BARIUM SWALLOW TECHNIQUE: Single contrast examination  was performed using thin barium or water soluble. A barium tablet was administered FLUOROSCOPY TIME:  Radiation Exposure Index (as provided by the fluoroscopic device): 17.20 mGy entrance does If the device does not provide the exposure index: Fluoroscopy  Time:  1 minutes, 30 seconds COMPARISON:  None in PACs FINDINGS: The patient ingested the thin barium without difficulty. The cervical esophagus distended well with no obvious laryngeal penetration of the barium. The thoracic esophagus distended reasonably well. Esophageal peristalsis appeared normal. The barium tablet hung transiently in the distal third of the esophagus but no stricture was observed. No hiatal hernia or gastroesophageal reflux was observed. IMPRESSION: Normal esophageal function and appearance for age. No evidence of aspiration. No hiatal hernia or reflux was demonstrated. Electronically Signed   By: David  [place] M.D.   On: 10/30/2015 09:03   ASSESSMENT AND PLAN:   Torien Ramroop is a 82 y.o. male with a known history of Posttraumatic stress disorder, hypertension, coronary artery disease, COPD not on home oxygen, urinary retention with recent indwelling Foley catheter presents to the hospital for weakness.  #1 acute renal failure and hyponatremia -could be prerenal, better. continue IV fluids, discontinued bactrim - has a foley catheter already, measure I&O , f/u BMP.  #2 Hyperkalemia- discontinued bactrim add lisinopril Improved with fluids.  #3 hyotension-secondary to hypovolemia. Improved with IV fluid resuscitation. -Hold metoprolol, Norvasc, lisinopril and Lasix  #4 dysphagia and malnutrition. Normal barium swallow study. No GI on-call. Start full liquid diet. Dietitian consult  #5 weakness- physical therapy consult  All the records are reviewed and case discussed with Care Management/Social Worker. Management plans discussed with the patient, family and they are in agreement.  CODE STATUS: full code.  TOTAL TIME TAKING CARE OF THIS PATIENT: 36 minutes.   More than 50% of the time was spent in counseling/coordination of care: YES  POSSIBLE D/C IN 2 DAYS, DEPENDING ON CLINICAL CONDITION.   Shaune Pollack M.D on 10/30/2015 at 1:17 PM  Between 7am to 6pm - Pager -  [phone]  After 6pm go to [email or web address] - Social research officer, government  [facility] [place] [facility]  Office  [phone]  CC: Primary care physician; [name], MD  Note: This dictation was prepared with Dragon dictation along with smaller phrase technology. Any transcriptional errors that result from this process are unintentional.

## 2015-10-30 NOTE — Progress Notes (Signed)
10/30/2015 6:52 PM  Pt expressed frustration that he had asked for help ordering dinner long ago and was not assisted.  I apologized and called to order his dinner for him.  He expressed appreciation and I gave him my apologies.  Bradly Chrisougherty, Jammal Sarr E, RN

## 2015-10-30 NOTE — Progress Notes (Signed)
Initial Nutrition Assessment  DOCUMENTATION CODES:   Severe malnutrition in context of chronic illness  INTERVENTION:  -Monitor diet advancement and poc -Recommend once diet advanced Ensure Enlive po BID, each supplement provides 350 kcal and 20 grams of protein -Spoke with Judeth CornfieldStephanie in Care Management regarding pt concerns of living at home.  Care Management received consult and will be following   NUTRITION DIAGNOSIS:   Malnutrition related to chronic illness as evidenced by percent weight loss, severe depletion of muscle mass.    GOAL:   Patient will meet greater than or equal to 90% of their needs    MONITOR:   PO intake, Supplement acceptance, Diet advancement  REASON FOR ASSESSMENT:   Consult Poor PO  ASSESSMENT:      Pt admitted with anorexia, hyponatremia, swallowing problems (GI consulted), ARF.  Past Medical History  Diagnosis Date  . Hypertensive heart disease   . PTSD (post-traumatic stress disorder)     with headaches  . Mixed hyperlipidemia   . Coronary artery disease     a. 2012 s/p MI/cardiac arrest--> PCI mRCA.  Marland Kitchen. COPD (chronic obstructive pulmonary disease) (HCC)   . Anxiety    Pt reports no appetite for the past 4 days.  Prior to admission reports eating "fairly" well, eats a variety of foods (meat and veggies). Reports lives alone but daughter lives next door and prepares meals for him.  Reports that he is searching for a caregiver as it is difficult for him to take care of himself.  Asking for coffee this am  Medications reviewed: NS at 5575ml/hr Labs reviewed: Na 133, BUN 37, creatinine 1.73  Nutrition-Focused physical exam completed. Findings are moderate fat depletion, moderate to severe muscle depletion, and none edema.  Pt reports that it is hard for him to move around (physically) his house. Has motorized wheelchair, walker, etc.   Diet Order:  Diet NPO time specified  Skin:   (multiple skin tears noted, upper arm wound)  Last  BM:  PTA  Height:   Ht Readings from Last 1 Encounters:  10/29/15 5\' 6"  (1.676 m)    Weight: 15% wt loss in the last 5 months  Wt Readings from Last 1 Encounters:  10/29/15 127 lb 1.6 oz (57.652 kg)    Ideal Body Weight:     BMI:  Body mass index is 20.52 kg/(m^2).  Estimated Nutritional Needs:   Kcal:  1425-1710 kcals/d  Protein:  68-86 g/d  Fluid:  >/= 146925ml/d  EDUCATION NEEDS:   No education needs identified at this time  Edder Bellanca B. Freida BusmanAllen, RD, LDN [phone] (pager) Weekend/On-Call pager [phone])

## 2015-10-30 NOTE — Consult Note (Signed)
WOC Nurse wound consult note Reason for Consult:Patient sustained a fall at home over a month ago with nonhealing skin tears to bilateral upper extremities.   Wound type:Trauma, nonhealing Pressure Ulcer POA: N/a Measurement: RIght upper arm 4 cm x 0.5 cm x 0.2 cm skin tear no approximated skin flap present.  Right elbow 1 cm x 1 cm x 0.2 cm skin defect present Right wrist 0.5 cm x 0.5 cm x 0.1 cm  Pale pink and moist wound bed  Drainage (amount, consistency, odor) Scant serosanguinous  No odor. Periwound:Ecchymosis Dressing procedure/placement/frequency:Cleanse skin tears to arms.  Apply Bactroban.  Cover with Mepitel silicone contact layer. COver with 4x4, kerlix and tape.  Change daily.  Will not follow at this time.  Please re-consult if needed.  Maple HudsonKaren Juergen Hardenbrook RN BSN CWON Pager [phone]

## 2015-10-30 NOTE — Evaluation (Signed)
Physical Therapy Evaluation Patient Details Name: Daniel Dickson MRN: [id number] DOB: [birth year] Today's Date: 10/30/2015   History of Present Illness  presented to ER secondary to progressive weakness, swallowing difficulty (reports not eating in 4-5 days); admitted with acute renal failure.  Clinical Impression  Upon evaluation, patient sleepy, but arousable; agreeable to limited participation with PT efforts this date.  Demonstrates strength and ROM grossly symmetrical and WFL for basic transfers and mobility, but refuses attempts at any OOB activities despite encouragement.  Multiple skin tears evident (esp R UE)-patient reports 'bumping into things'.  Able to reposition self in bed indep, but noted facial grimacing due to back pain. Patient expressing desire to have additional assist in home vs STR.  Did review with patient that mobility assessment would need to be completed to allow for appropriate recommendations and discharge services; continued to decline at this time. Would benefit from skilled PT to address above deficits and promote optimal return to PLOF; will complete formal discharge recommendations pending full mobility assessment next treatment session.  Re-attempted in PM; patient continued to refuse any OOB activities due to weakness and not having "eaten a good meal".  Will continue efforts next date.    Follow Up Recommendations Other (comment) (TBD pending additional mobility assessment)    Equipment Recommendations       Recommendations for Other Services       Precautions / Restrictions Precautions Precautions: Fall Restrictions Weight Bearing Restrictions: No      Mobility  Bed Mobility               General bed mobility comments: Indep for partial rolling/repositioning; patient refused OOB efforts  Transfers                 General transfer comment: patient refused  Ambulation/Gait             General Gait Details: patient  refused  Stairs            Wheelchair Mobility    Modified Rankin (Stroke Patients Only)       Balance                                             Pertinent Vitals/Pain Pain Assessment: No/denies pain    Home Living Family/patient expects to be discharged to:: Private residence Living Arrangements: Children - son has Down Syndrome; daughter lives next door and provides frequent check in Available Help at Discharge: Family Type of Home: House Home Access: Ramped entrance     Home Layout: One level Home Equipment: Environmental consultant - 2 wheels;Walker - 4 wheels;Grab bars - toilet;Wheelchair - manual Additional Comments: Pt has urinals beside his recliner and his porch chair so that he does not have to ambulate <> bathroom throughout the day (per previous notes)    Prior Function           Comments: Mod indep for limited household mobility with SPC (though previous records indicate RW); assist from aide vs. daughter for ADLs, household chores.  Denies fall history.     Hand Dominance        Extremity/Trunk Assessment   Upper Extremity Assessment: Overall WFL for tasks assessed           Lower Extremity Assessment: Overall WFL for tasks assessed (grossly at least 4/5)         Communication  Communication: HOH  Cognition Arousal/Alertness: Awake/alert Behavior During Therapy: WFL for tasks assessed/performed Overall Cognitive Status: Within Functional Limits for tasks assessed                      General Comments      Exercises Other Exercises Other Exercises: Attempted supine therex, transition to edge of bed after strength/range assessment; patient consistently refusing despite encouragement.      Assessment/Plan    PT Assessment Patient needs continued PT services  PT Diagnosis Difficulty walking;Generalized weakness   PT Problem List Decreased activity tolerance;Decreased mobility  PT Treatment Interventions DME  instruction;Gait training;Functional mobility training;Therapeutic activities;Therapeutic exercise;Balance training;Patient/family education   PT Goals (Current goals can be found in the Care Plan section) Acute Rehab PT Goals Patient Stated Goal: to get some rest and try this later PT Goal Formulation: With patient Time For Goal Achievement: 11/13/15 Potential to Achieve Goals: Fair    Frequency Min 2X/week   Barriers to discharge Decreased caregiver support      Co-evaluation               End of Session   Activity Tolerance: Other (comment) (Treatment limited by patient fatigue and refusal to complete OOB activities) Patient left: in bed;with call bell/phone within reach;with bed alarm set Nurse Communication: Mobility status         Time: [id number] PT Time Calculation (min) (ACUTE ONLY): 11 min   Charges:   PT Evaluation $PT Eval Low Complexity: 1 Procedure     PT G Codes:        Caileen Veracruz H. Manson PasseyBrown, PT, DPT, NCS 10/30/2015, 12:00 PM [phone]

## 2015-10-30 NOTE — Care Management (Signed)
Patient admitted from home with AKI.  Patient lives at home with son who has down's syndrome.  Daughter who is POA lives next door.  [facility]. [facility].  Signed form placed in patient's chart.  [facility] open with [facility] for RN and aide.  Cheryl with [facility] notified.  PT consult pending.  Patient states that he has a walker, cane, and WC in the home.  [facility]  RNCM following.

## 2015-10-31 LAB — BASIC METABOLIC PANEL
ANION GAP: 5 (ref 5–15)
BUN: 24 mg/dL — ABNORMAL HIGH (ref 6–20)
CALCIUM: 8.2 mg/dL — AB (ref 8.9–10.3)
CO2: 19 mmol/L — ABNORMAL LOW (ref 22–32)
CREATININE: 1.21 mg/dL (ref 0.61–1.24)
Chloride: 107 mmol/L (ref 101–111)
GFR, EST AFRICAN AMERICAN: 54 mL/min — AB (ref >60)
GFR, EST NON AFRICAN AMERICAN: 47 mL/min — AB (ref >60)
GLUCOSE: 75 mg/dL (ref 65–99)
Potassium: 4.6 mmol/L (ref 3.5–5.1)
Sodium: 131 mmol/L — ABNORMAL LOW (ref 135–145)

## 2015-10-31 LAB — OSMOLALITY, URINE: Osmolality, Ur: 557 mOsm/kg (ref 300–900)

## 2015-10-31 MED ORDER — PANTOPRAZOLE SODIUM 40 MG PO TBEC
40.0000 mg | DELAYED_RELEASE_TABLET | Freq: Every day | ORAL | Status: DC
  Administered 2015-10-31 – 2015-11-02 (×3): 40 mg via ORAL
  Filled 2015-10-31 (×3): qty 1

## 2015-10-31 MED ORDER — MAGNESIUM HYDROXIDE 400 MG/5ML PO SUSP
30.0000 mL | Freq: Two times a day (BID) | ORAL | Status: DC | PRN
  Administered 2015-10-31: 30 mL via ORAL
  Filled 2015-10-31: qty 30

## 2015-10-31 MED ORDER — BOOST / RESOURCE BREEZE PO LIQD
1.0000 | Freq: Three times a day (TID) | ORAL | Status: DC
  Administered 2015-10-31 – 2015-11-02 (×8): 1 via ORAL

## 2015-10-31 MED ORDER — POLYETHYLENE GLYCOL 3350 17 G PO PACK
17.0000 g | PACK | Freq: Every day | ORAL | Status: DC
  Administered 2015-10-31 – 2015-11-02 (×2): 17 g via ORAL
  Filled 2015-10-31 (×3): qty 1

## 2015-10-31 NOTE — Progress Notes (Signed)
Physical Therapy Treatment Patient Details Name: Daniel Dickson MRN: [id number] DOB: 17-Oct-1912 Today's Date: 10/31/2015    History of Present Illness presented to ER secondary to progressive weakness, swallowing difficulty (reports not eating in 4-5 days); admitted with acute renal failure.    PT Comments    Patient is a pleasant 82 y.o. Male who was excited to participate in PT this afternoon. After assessing strength and bed mobility, patient expressed agreement to participate in gait training. After ambulating 15', patient's vitals were assessed, and PT noted oxygen saturations to be 84%. Returned to room and upon sitting, oxygen saturations quickly returned to 96-98%; however, HR spiked from mid-90s to 113-115 bpm. Took ~10 mins of rest for HR to drop below 100 bpm. Nurse and CNA informed of response. Patient reported that he does utilize 24 hour oxygen at home but is unsure of flow rate. Patient will benefit from further skilled and progressive PT as tolerated when medically stable. Discharge plan updated based on mobility and symptom response of today's session but may change at future f/u.  Follow Up Recommendations  SNF     Equipment Recommendations       Recommendations for Other Services       Precautions / Restrictions Precautions Precautions: Fall Restrictions Weight Bearing Restrictions: No    Mobility  Bed Mobility Overal bed mobility: Needs Assistance Bed Mobility: Supine to Sit;Sit to Supine     Supine to sit: HOB elevated;Min guard Sit to supine: Min assist   General bed mobility comments: Patient required CGA-minimal assistance to perform bed mobility. Was able to bridge to scoot towards HOB.  Transfers Overall transfer level: Needs assistance Equipment used: Rolling walker (2 wheeled) Transfers: Sit to/from Stand Sit to Stand: Min guard         General transfer comment: Patient able to move from sit to stand with good safety awareness and  knowledge of equipment use.  Ambulation/Gait Ambulation/Gait assistance: Min guard Ambulation Distance (Feet): 30 Feet Assistive device: Rolling walker (2 wheeled)       General Gait Details: Patient ambulates at decreased cadence with kyphotic posture. Requires cues to lift head. Patient's O2 saturation/HR assessed in standing after 15' of ambulation, demonstrating O2 saturations 84% and HR in mid-90s. Denies dizziness. Returned to room. Upon sitting, O2 saturations returned to 96-98% quickly, but HR spiked to 113-115 bpm and remained >100 bpm for 10 mins of rest.   Stairs            Wheelchair Mobility    Modified Rankin (Stroke Patients Only)       Balance Overall balance assessment: Needs assistance Sitting-balance support: Feet supported Sitting balance-Leahy Scale: Fair     Standing balance support: Bilateral upper extremity supported Standing balance-Leahy Scale: Fair                      Cognition Arousal/Alertness: Awake/alert Behavior During Therapy: WFL for tasks assessed/performed Overall Cognitive Status: Within Functional Limits for tasks assessed                      Exercises      General Comments        Pertinent Vitals/Pain Pain Assessment: No/denies pain Pain Score: 0-No pain    Home Living                      Prior Function            PT Goals (  current goals can now be found in the care plan section) Acute Rehab PT Goals Patient Stated Goal: "To get moving." PT Goal Formulation: With patient Time For Goal Achievement: 11/13/15 Potential to Achieve Goals: Fair Progress towards PT goals: PT to reassess next treatment    Frequency  Min 2X/week    PT Plan Discharge plan needs to be updated    Co-evaluation             End of Session Equipment Utilized During Treatment: Gait belt Activity Tolerance: Treatment limited secondary to medical complications (Comment) Patient left: in bed;with call  bell/phone within reach;with bed alarm set;with SCD's reapplied     Time: 1240-1306 PT Time Calculation (min) (ACUTE ONLY): 26 min  Charges:  $Gait Training: 8-22 mins                    G Codes:      Neita Carp, PT, DPT 10/31/2015, 1:19 PM

## 2015-10-31 NOTE — Progress Notes (Signed)
[facility] - [place] at [facility] [place]   PATIENT NAME: Grantland Want    MR#:  [id number]  DATE OF BIRTH:  [birth year]  SUBJECTIVE:  CHIEF COMPLAINT:   Chief Complaint  Patient presents with  . Anorexia   Anorexia and weakness.The patient complains of constipation, requests milk of magnesia. Physical therapist evaluated patient and recommended skilled nursing facility placement. REVIEW OF SYSTEMS:  Review of Systems  Constitutional: Positive for weight loss and malaise/fatigue. Negative for fever and chills.  HENT: Negative for congestion.   Eyes: Negative for blurred vision and double vision.  Respiratory: Negative for cough, hemoptysis, sputum production, shortness of breath and wheezing.   Cardiovascular: Negative for chest pain, palpitations, orthopnea and leg swelling.  Gastrointestinal: Negative for heartburn, nausea, vomiting and abdominal pain.  Genitourinary: Negative for dysuria, urgency and frequency.  Musculoskeletal: Negative for joint pain.  Neurological: Positive for weakness. Negative for dizziness, sensory change, speech change, focal weakness, seizures, loss of consciousness and headaches.  Psychiatric/Behavioral: Negative for depression. The patient is not nervous/anxious.     DRUG ALLERGIES:   Allergies  Allergen Reactions  . Cortisone Other (See Comments)    Unknown reaction  . Dye Fdc Red [Red Dye] Other (See Comments)    Unknown reaction.  . Peanut Oil     Other reaction(s): Unknown  . Peanut-Containing Drug Products     Other reaction(s): Unknown  . Penicillins     patient not sure of reaction   VITALS:  Blood pressure 123/67, pulse 96, temperature 97.9 F (36.6 C), temperature source Oral, resp. rate 16, height  (1.676 m), weight 57.652 kg (127 lb 1.6 oz), SpO2 99 %. PHYSICAL EXAMINATION:  Physical Exam  Constitutional: He is oriented to person, place, and time. No distress.  HENT:  Head: Normocephalic.  Mouth/Throat:  Oropharynx is clear and moist.  Eyes: Conjunctivae and EOM are normal.  Neck: No JVD present. No thyromegaly present.  Cardiovascular: Normal rate, regular rhythm and normal heart sounds.   No murmur heard. Pulmonary/Chest: Effort normal and breath sounds normal. He has no wheezes. He has no rales.  Abdominal: Soft. Bowel sounds are normal. He exhibits no distension. There is no tenderness.  Musculoskeletal: Normal range of motion. He exhibits no edema or tenderness.  Neurological: He is alert and oriented to person, place, and time. No cranial nerve deficit.  Skin: No rash noted. No erythema.  Psychiatric: Mood, affect and judgment normal.   LABORATORY PANEL:   CBC  Recent Labs Lab 10/30/15 0440  WBC 6.2  HGB 12.6*  HCT 36.6*  PLT 112*   ------------------------------------------------------------------------------------------------------------------ Chemistries   Recent Labs Lab 10/29/15 1716  10/31/15 0540  NA 128*  < > 131*  K 5.5*  < > 4.6  CL 99*  < > 107  CO2 21*  < > 19*  GLUCOSE 114*  < > 75  BUN 40*  < > 24*  CREATININE 2.14*  < > 1.21  CALCIUM 8.9  < > 8.2*  AST 32  --   --   ALT 23  --   --   ALKPHOS 41  --   --   BILITOT 0.8  --   --   < > = values in this interval not displayed. RADIOLOGY:  No results found. ASSESSMENT AND PLAN:   Gustav Knueppel is a 82 y.o. male with a known history of Posttraumatic stress disorder, hypertension, coronary artery disease, COPD not on home oxygen, urinary retention with recent indwelling  Foley catheter presents to the hospital for weakness.  #1 acute renal failure  -Suspected prerenal, resolving. continue IV fluids, now off bactrim - has a foley catheter already, measure I&O , f/u BMP.  #2 Hyperkalemia- discontinued bactrim , lisinopril Improved with fluids.  #3 hypotension-secondary to hypovolemia. Improved with IV fluid resuscitation. -Holding metoprolol, Norvasc, lisinopril and Lasix  #4 dysphagia and  malnutrition. Normal barium swallow study. No GI on-call. Advance diet to dysphagia 3, follow clinically and observe fro any signs and symptoms of dysphagia. Get SLP. Barium swallowing study was unremarkable  #5 weakness- physical therapy consult, skilled nursing facility was recommended  #6. Constipation, initiate milk of magnesia and MiraLAX, follow output and oral intake  #7. Failure to thrive, adult, malnutrition, weight loss, get gastroenterologist involved for recommendations, initiate Protonix  All the records are reviewed and case discussed with Care Management/Social Worker. Management plans discussed with the patient, family and they are in agreement.  CODE STATUS: full code.  TOTAL TIME TAKING CARE OF THIS PATIENT: 40 minutes.   More than 50% of the time was spent in counseling/coordination of care: YES  POSSIBLE D/C IN 2 DAYS, DEPENDING ON CLINICAL CONDITION.   Katharina Caper M.D on 10/31/2015 at 1:59 PM  Between 7am to 6pm - Pager - [phone]  After 6pm go to [email or web address] - Social research officer, government  [facility] [place] [facility]  Office  [phone]  CC: Primary care physician; [name], MD  Note: This dictation was prepared with Dragon dictation along with smaller phrase technology. Any transcriptional errors that result from this process are unintentional.

## 2015-10-31 NOTE — Care Management Important Message (Signed)
Important Message  Patient Details  Name: Brilee Bethancourt MRN: [id number] Date of Birth: Sep 03, 1912   Medicare Important Message Given:  Yes    Lawerance Sabal, RN 10/31/2015, 12:10 PMImportant Message  Patient Details  Name: Kasi Dain MRN: [id number] Date of Birth: 1913/04/06   Medicare Important Message Given:  Yes    Lawerance Sabal, RN 10/31/2015, 12:09 PM

## 2015-11-01 LAB — BASIC METABOLIC PANEL
Anion gap: 5 (ref 5–15)
BUN: 20 mg/dL (ref 6–20)
CHLORIDE: 103 mmol/L (ref 101–111)
CO2: 21 mmol/L — AB (ref 22–32)
CREATININE: 1.02 mg/dL (ref 0.61–1.24)
Calcium: 8.3 mg/dL — ABNORMAL LOW (ref 8.9–10.3)
GFR calc non Af Amer: 58 mL/min — ABNORMAL LOW (ref >60)
Glucose, Bld: 100 mg/dL — ABNORMAL HIGH (ref 65–99)
POTASSIUM: 4.5 mmol/L (ref 3.5–5.1)
Sodium: 129 mmol/L — ABNORMAL LOW (ref 135–145)

## 2015-11-01 LAB — URINE CULTURE: Culture: 30000 — AB

## 2015-11-01 LAB — CBC
HEMATOCRIT: 37.4 % — AB (ref 40.0–52.0)
HEMOGLOBIN: 13 g/dL (ref 13.0–18.0)
MCH: 33.9 pg (ref 26.0–34.0)
MCHC: 34.6 g/dL (ref 32.0–36.0)
MCV: 97.9 fL (ref 80.0–100.0)
PLATELETS: 149 10*3/uL — AB (ref 150–440)
RBC: 3.82 MIL/uL — AB (ref 4.40–5.90)
RDW: 13.7 % (ref 11.5–14.5)
WBC: 5.4 10*3/uL (ref 3.8–10.6)

## 2015-11-01 MED ORDER — ISOSORBIDE MONONITRATE ER 30 MG PO TB24
30.0000 mg | ORAL_TABLET | Freq: Every day | ORAL | Status: DC
  Administered 2015-11-01 – 2015-11-02 (×2): 30 mg via ORAL
  Filled 2015-11-01 (×2): qty 1

## 2015-11-01 NOTE — NC FL2 (Signed)
[place] MEDICAID FL2 LEVEL OF CARE SCREENING TOOL     IDENTIFICATION  Patient Name: Daniel Dickson Birthdate: February 18, 1913 Sex: male Admission Date (Current Location): 10/29/2015  [place] and [place] Number:  Chiropodist and Address:  [facility], [address], [place], [place] [address]      Provider Number: [id number]  Attending Physician Name and Address:  Katharina Caper, MD  Relative Name and Phone Number:       Current Level of Care: Hospital Recommended Level of Care: Skilled Nursing Facility Prior Approval Number:    Date Approved/Denied:   PASRR Number:  [id number])  Discharge Plan: SNF    Current Diagnoses: Patient Active Problem List   Diagnosis Date Noted  . Protein-calorie malnutrition, severe 10/30/2015  . ARF (acute renal failure) (HCC) 10/29/2015  . Dyspnea 10/07/2015  . Hyponatremia 06/06/2015  . Coronary artery disease   . Hypertensive heart disease   . Mixed hyperlipidemia   . COPD (chronic obstructive pulmonary disease) (HCC)   . COPD exacerbation (HCC) 01/11/2015  . Moderate COPD (chronic obstructive pulmonary disease) (HCC) 12/08/2014  . Fall 09/03/2014  . Hip fracture (HCC) 09/03/2014  . PTSD (post-traumatic stress disorder) 09/03/2014  . Bilateral leg edema 09/03/2014  . SOB (shortness of breath) 10/31/2012  . Essential hypertension 10/31/2012  . CAD (coronary artery disease) 10/31/2012  . Hyperlipidemia 10/31/2012  . Tachycardia 10/31/2012    Orientation RESPIRATION BLADDER Height & Weight     Situation, Place, Time, Self  Normal Continent Weight: 127 lb 1.6 oz (57.7 kg) Height:   (167.6 cm)  BEHAVIORAL SYMPTOMS/MOOD NEUROLOGICAL BOWEL NUTRITION STATUS   (None)  (None) Continent Diet (DYS 3)  AMBULATORY STATUS COMMUNICATION OF NEEDS Skin   Extensive Assist Verbally Other (Comment) (Upper Right Arm and Right Elbow- Open Bleeding;)                       Personal Care Assistance  Level of Assistance  Bathing, Feeding, Dressing Bathing Assistance: Limited assistance Feeding assistance: Independent Dressing Assistance: Limited assistance     Functional Limitations Info  Sight, Hearing, Speech Sight Info: Impaired Hearing Info: Adequate Speech Info: Adequate    SPECIAL CARE FACTORS FREQUENCY  PT (By licensed PT), OT (By licensed OT)     PT Frequency:  (5) OT Frequency:  (5)            Contractures      Additional Factors Info  Code Status, Allergies Code Status Info:  (Full Code) Allergies Info:  (Cortisone, Dye Fdc Red Red Dye, Peanut Oil, Peanut-containing Drug Products, Penicillins)           Current Medications (11/01/2015):  This is the current hospital active medication list Current Facility-Administered Medications  Medication Dose Route Frequency Provider Last Rate Last Dose  . acetaminophen (TYLENOL) tablet 650 mg  650 mg Oral Q6H PRN Enid Baas, MD   650 mg at 11/01/15 1139   Or  . acetaminophen (TYLENOL) suppository 650 mg  650 mg Rectal Q6H PRN Enid Baas, MD      . albuterol (PROVENTIL) (2.5 MG/3ML) 0.083% nebulizer solution 3 mL  3 mL Inhalation Q6H PRN Enid Baas, MD      . benzonatate (TESSALON) capsule 200 mg  200 mg Oral TID PRN Enid Baas, MD      . carisoprodol (SOMA) tablet 350 mg  350 mg Oral TID PRN Alexis Hugelmeyer, DO   350 mg at 11/01/15 0524  . feeding  supplement (BOOST / RESOURCE BREEZE) liquid 1 Container  1 Container Oral TID BM Katharina Caper, MD   1 Container at 11/01/15 0947  . finasteride (PROSCAR) tablet 5 mg  5 mg Oral Daily Enid Baas, MD   5 mg at 11/01/15 0945  . fluticasone furoate-vilanterol (BREO ELLIPTA) 100-25 MCG/INH 1 puff  1 puff Inhalation Daily Enid Baas, MD   1 puff at 11/01/15 0946  . heparin injection 5,000 Units  5,000 Units Subcutaneous Q8H Enid Baas, MD   5,000 Units at 11/01/15 0525  . isosorbide mononitrate (IMDUR) 24 hr tablet 30 mg  30 mg  Oral Daily Katharina Caper, MD   30 mg at 11/01/15 0945  . magnesium hydroxide (MILK OF MAGNESIA) suspension 30 mL  30 mL Oral BID PRN Katharina Caper, MD   30 mL at 10/31/15 1218  . Melatonin TABS 10 mg  10 mg Oral QHS Enid Baas, MD   10 mg at 10/31/15 2202  . mupirocin cream (BACTROBAN) 2 %   Topical Daily Shaune Pollack, MD      . nitroGLYCERIN (NITROSTAT) SL tablet 0.4 mg  0.4 mg Sublingual Q5 min PRN Enid Baas, MD      . ondansetron (ZOFRAN) tablet 4 mg  4 mg Oral Q6H PRN Enid Baas, MD       Or  . ondansetron (ZOFRAN) injection 4 mg  4 mg Intravenous Q6H PRN Enid Baas, MD      . pantoprazole (PROTONIX) EC tablet 40 mg  40 mg Oral Daily Katharina Caper, MD   40 mg at 11/01/15 0945  . polyethylene glycol (MIRALAX / GLYCOLAX) packet 17 g  17 g Oral Daily Katharina Caper, MD   17 g at 10/31/15 1218  . pravastatin (PRAVACHOL) tablet 80 mg  80 mg Oral QHS Enid Baas, MD   80 mg at 10/31/15 2202  . tamsulosin (FLOMAX) capsule 0.4 mg  0.4 mg Oral QPC supper Enid Baas, MD   0.4 mg at 10/31/15 1706  . tiotropium (SPIRIVA) inhalation capsule 18 mcg  18 mcg Inhalation QPM Enid Baas, MD   18 mcg at 10/31/15 1707     Discharge Medications: Please see discharge summary for a list of discharge medications.  Relevant Imaging Results:  Relevant Lab Results:   Additional Information  (SSN [id number])  Verta Ellen Helix Lafontaine, LCSW
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
## 2015-11-01 NOTE — Progress Notes (Signed)
[facility] - [place] [facility]   PATIENT NAME: Daniel Dickson    MR#:  [id number]  DATE OF BIRTH:  Aug 18, 1912  SUBJECTIVE:  CHIEF COMPLAINT:   Chief Complaint  Patient presents with  . Anorexia   Anorexia and weakness.The patient complains of constipation, Which did not resolve with milk of magnesia and MiraLAX, requests enema. Complains of dysphagia, feeling like food does not go down esophagus, coffee gets stuck in the middle esophagus. Physical therapist evaluated patient and recommended skilled nursing facility placement. REVIEW OF SYSTEMS:  Review of Systems  Constitutional: Positive for malaise/fatigue and weight loss. Negative for chills and fever.  HENT: Negative for congestion.   Eyes: Negative for blurred vision and double vision.  Respiratory: Negative for cough, hemoptysis, sputum production, shortness of breath and wheezing.   Cardiovascular: Negative for chest pain, palpitations, orthopnea and leg swelling.  Gastrointestinal: Negative for abdominal pain, heartburn, nausea and vomiting.  Genitourinary: Negative for dysuria, frequency and urgency.  Musculoskeletal: Negative for joint pain.  Neurological: Positive for weakness. Negative for dizziness, sensory change, speech change, focal weakness, seizures, loss of consciousness and headaches.  Psychiatric/Behavioral: Negative for depression. The patient is not nervous/anxious.     DRUG ALLERGIES:   Allergies  Allergen Reactions  . Cortisone Other (See Comments)    Unknown reaction  . Dye Fdc Red [Red Dye] Other (See Comments)    Unknown reaction.  . Peanut Oil     Other reaction(s): Unknown  . Peanut-Containing Drug Products     Other reaction(s): Unknown  . Penicillins     patient not sure of reaction   VITALS:  Blood pressure 101/61, pulse 91, temperature 97.5 F (36.4 C), temperature source Oral, resp. rate 18, height 5\' 6"  (1.676 m), weight 57.7 kg (127 lb 1.6 oz), SpO2 97  %. PHYSICAL EXAMINATION:  Physical Exam  Constitutional: He is oriented to person, place, and time. No distress.  HENT:  Head: Normocephalic.  Mouth/Throat: Oropharynx is clear and moist.  Eyes: Conjunctivae and EOM are normal.  Neck: No JVD present. No thyromegaly present.  Cardiovascular: Normal rate, regular rhythm and normal heart sounds.   No murmur heard. Pulmonary/Chest: Effort normal and breath sounds normal. He has no wheezes. He has no rales.  Abdominal: Soft. Bowel sounds are normal. He exhibits no distension. There is no tenderness.  Musculoskeletal: Normal range of motion. He exhibits no edema or tenderness.  Neurological: He is alert and oriented to person, place, and time. No cranial nerve deficit.  Skin: No rash noted. No erythema.  Psychiatric: Mood, affect and judgment normal.   LABORATORY PANEL:   CBC  Recent Labs Lab 11/01/15 0609  WBC 5.4  HGB 13.0  HCT 37.4*  PLT 149*   ------------------------------------------------------------------------------------------------------------------ Chemistries   Recent Labs Lab 10/29/15 1716  11/01/15 0609  NA 128*  < > 129*  K 5.5*  < > 4.5  CL 99*  < > 103  CO2 21*  < > 21*  GLUCOSE 114*  < > 100*  BUN 40*  < > 20  CREATININE 2.14*  < > 1.02  CALCIUM 8.9  < > 8.3*  AST 32  --   --   ALT 23  --   --   ALKPHOS 41  --   --   BILITOT 0.8  --   --   < > = values in this interval not displayed. RADIOLOGY:  No results found. ASSESSMENT AND PLAN:   Tyreque Schnurr is  a 82 y.o. male with a known history of Posttraumatic stress disorder, hypertension, coronary artery disease, COPD not on home oxygen, urinary retention with recent indwelling Foley catheter presents to the hospital for weakness.  #1 acute renal failure  -Suspected prerenal, Resolved. continue IV fluids, now off bactrim - has a foley catheter already, measure I&O , f/u BMP.  #2 Hyperkalemia- discontinued bactrim , lisinopril Improved with  fluids.  #3 hypotension-secondary to hypovolemia. Improved with IV fluid resuscitation. -Holding metoprolol, Norvasc, lisinopril and Lasix  #4 dysphagia and malnutrition. Normal barium swallow study indicates esophageal spasm. Continue dysphagia 3, patient admits of intermittent esophageal spasms, initiate Imdur, follow clinically .   #5 weakness- physical therapy consult, skilled nursing facility was recommended  #6. Constipation, no improvement with milk of magnesia and MiraLAX, start enemas daily, patient does have a good oral intake, eats about 75% of offered meal, likely discharged to skilled nursing facility tomorrow as long as constipation has  resolved  #7. Failure to thrive, adult, malnutrition, weight loss, would benefit from gastroenterologist input as outpatient, continue Protonix, Imdur. May need to add calcium channel blockers, if blood pressure tolerates, for sophageal spasm control  All the records are reviewed and case discussed with Care Management/Social Worker. Management plans discussed with the patient, family and they are in agreement.  CODE STATUS: full code.  TOTAL TIME TAKING CARE OF THIS PATIENT: 30 minutes.   More than 50% of the time was spent in counseling/coordination of care: YES  POSSIBLE D/C TOMORROW.   Katharina Caper M.D on 11/01/2015 at 1:46 PM  Between 7am to 6pm - Pager - [phone]  After 6pm go to [email or web address] - Social research officer, government  [facility] [place] [facility]  Office  [phone]  CC: Primary care physician; [name], MD  Note: This dictation was prepared with Dragon dictation along with smaller phrase technology. Any transcriptional errors that result from this process are unintentional.

## 2015-11-02 ENCOUNTER — Encounter
Admission: RE | Admit: 2015-11-02 | Discharge: 2015-11-02 | Disposition: A | Discharge status: Home / Self Care | Payer: Medicare PPO | Source: Ambulatory Visit | Attending: Internal Medicine | Admitting: Internal Medicine

## 2015-11-02 DIAGNOSIS — E222 Syndrome of inappropriate secretion of antidiuretic hormone: Secondary | ICD-10-CM

## 2015-11-02 DIAGNOSIS — Z978 Presence of other specified devices: Secondary | ICD-10-CM

## 2015-11-02 DIAGNOSIS — E875 Hyperkalemia: Secondary | ICD-10-CM

## 2015-11-02 DIAGNOSIS — R319 Hematuria, unspecified: Secondary | ICD-10-CM

## 2015-11-02 DIAGNOSIS — I959 Hypotension, unspecified: Secondary | ICD-10-CM

## 2015-11-02 DIAGNOSIS — N401 Enlarged prostate with lower urinary tract symptoms: Secondary | ICD-10-CM

## 2015-11-02 DIAGNOSIS — R338 Other retention of urine: Secondary | ICD-10-CM

## 2015-11-02 DIAGNOSIS — N390 Urinary tract infection, site not specified: Secondary | ICD-10-CM

## 2015-11-02 DIAGNOSIS — E871 Hypo-osmolality and hyponatremia: Secondary | ICD-10-CM | POA: Insufficient documentation

## 2015-11-02 DIAGNOSIS — R1314 Dysphagia, pharyngoesophageal phase: Secondary | ICD-10-CM

## 2015-11-02 DIAGNOSIS — K5900 Constipation, unspecified: Secondary | ICD-10-CM

## 2015-11-02 DIAGNOSIS — Z960 Presence of urogenital implants: Secondary | ICD-10-CM

## 2015-11-02 DIAGNOSIS — K224 Dyskinesia of esophagus: Secondary | ICD-10-CM

## 2015-11-02 LAB — SODIUM: Sodium: 128 mmol/L — ABNORMAL LOW (ref 135–145)

## 2015-11-02 MED ORDER — BOOST / RESOURCE BREEZE PO LIQD: 1.0000 | Freq: Three times a day (TID) | ORAL | 5 refills | Status: DC

## 2015-11-02 MED ORDER — MAGNESIUM HYDROXIDE 400 MG/5ML PO SUSP: 30.0000 mL | Freq: Two times a day (BID) | ORAL | 0 refills | Status: AC | PRN

## 2015-11-02 MED ORDER — GI COCKTAIL ~~LOC~~
30.0000 mL | Freq: Three times a day (TID) | ORAL | Status: DC | PRN
  Administered 2015-11-02: 30 mL via ORAL
  Filled 2015-11-02 (×2): qty 30

## 2015-11-02 MED ORDER — PANTOPRAZOLE SODIUM 40 MG PO TBEC: 40.0000 mg | DELAYED_RELEASE_TABLET | Freq: Every day | ORAL | 5 refills | Status: DC

## 2015-11-02 MED ORDER — POLYETHYLENE GLYCOL 3350 17 G PO PACK
17.0000 g | PACK | Freq: Every day | ORAL | 0 refills | Status: DC
Start: 2015-11-02 — End: 2016-02-18

## 2015-11-02 MED ORDER — ISOSORBIDE MONONITRATE ER 30 MG PO TB24: 30.0000 mg | ORAL_TABLET | Freq: Every day | ORAL | 5 refills | Status: DC

## 2015-11-02 MED ORDER — MUPIROCIN CALCIUM 2 % EX CREA: TOPICAL_CREAM | Freq: Every day | CUTANEOUS | 0 refills | Status: DC

## 2015-11-02 MED ORDER — ONDANSETRON HCL 4 MG PO TABS: 4.0000 mg | ORAL_TABLET | Freq: Four times a day (QID) | ORAL | 0 refills | Status: DC | PRN

## 2015-11-02 MED ORDER — GI COCKTAIL ~~LOC~~: 30.0000 mL | Freq: Three times a day (TID) | ORAL | 3 refills | Status: DC | PRN

## 2015-11-02 NOTE — Progress Notes (Signed)
SNF and Non-Emergent EMS Transport Benefits:  Number called: [phone] Rep: Verdis Frederickson Reference Number: [id number]  [facility] Medicare Replacement PPO  plan active since 04/12/07 with no deductible for SNF or EMS services..  Out of pocket max is $4900, $1250.75 met as of time of call.     In-network SNF: $0 copay for days 1-20 and a $160 daily copay for days 21-100.  Once out of pocket is reached, patient covered at 100% for remainder of 100 day benefit period.   Per rep, all 100 days available at this time.  Josem Kaufmann is required: [phone].    Non-emergent EMS transport: $265 copay per date of service.  Must be medically necessary.  Josem Kaufmann is not required for service codes [phone] or [phone].

## 2015-11-02 NOTE — Evaluation (Signed)
Clinical/Bedside Swallow Evaluation Patient Details  Name: Daniel Dickson MRN: [id number] Date of Birth: 08/09/1912  Today's Date: 11/02/2015 Time: SLP Start Time (ACUTE ONLY): 1030 SLP Stop Time (ACUTE ONLY): 1130 SLP Time Calculation (min) (ACUTE ONLY): 60 min  Past Medical History:  Past Medical History:  Diagnosis Date  . Anxiety   . COPD (chronic obstructive pulmonary disease) (HCC)   . Coronary artery disease    a. 2012 s/p MI/cardiac arrest--> PCI mRCA.  Marland Kitchen Hypertensive heart disease   . Mixed hyperlipidemia   . PTSD (post-traumatic stress disorder)    with headaches   Past Surgical History:  Past Surgical History:  Procedure Laterality Date  . CATARACT EXTRACTION    . CHOLECYSTECTOMY    . CORONARY ANGIOPLASTY     with stent; [facility]  . HAND SURGERY    . HEMORRHOID SURGERY    . HIP SURGERY    . VASCULAR SURGERY     stent R femoral artery   HPI:  Pt is a 82 y.o. male with a known history of Posttraumatic stress disorder, hypertension, coronary artery disease, COPD not on home oxygen, urinary retention with recent indwelling Foley catheter presents to the hospital for weakness. Patient stated his symptoms actually have been going on for 6 months now. He has had previous ER visits for the same. He also expresses swallowing difficulty and decreased appetite and even after swallowing he feels that he has mid-sternal pain while the food tries to go down. Never had an EGD for the same. He has been on Bactrim and Levaquin recently as outpatient, he is not sure why. His labs indicated that he is hyponatremic, hyperkalemic and with acute renal failure. Pt stated drinking liquids is easiest but even those can cause mid-sternal discomfort for him. Pt is alert and oriented x3; follows all instruction.    Assessment / Plan / Recommendation Clinical Impression  Pt appeared to adequately tolerate trials of thin liquids via cup/straw and few trials of puree/softened solids w/ no immediate,  overt s/s of aspiration. However, pt c/o slight-mild mid-sternal discomfort when drinking and eating; this was aided by taking rest breaks during oral intake. Pt exhibited no oral phase deficits; timely bolus management and A-P transfer was noted. Pt was given instruction on general aspiration and Reflux precautions which he seems aware of and practices - he stated the Esophageal discomfort when eating/drinking had been ongoing for several months. Information was given on choosing the right foods and adding more puree foods into his diet if necessary as well as drink supplements from the Dietician. Pt appears at reduced risk for aspiration following general precautions but would benefit from a modified, Dysphagia 3 diet w/ thin liquids. ST updated NSG/MD re: eval results and poc including GI f/u as indicated. MD/NSG agreed.     Aspiration Risk   (reduced from an oropharyngeal standpoint; Reflux+)    Diet Recommendation  Dysphagia 3 w/ thin liquids; gravy added to moisten well. General aspiration precautions; Strict REFLUX precautions.   Medication Administration: Whole meds with puree (if easier for swallowing and Esophageal clearing)    Other  Recommendations Recommended Consults: Consider GI evaluation;Consider esophageal assessment Oral Care Recommendations: Oral care BID;Staff/trained caregiver to provide oral care   Follow up Recommendations  None    Frequency and Duration            Prognosis Prognosis for Safe Diet Advancement: Fair Barriers to Reach Goals:  (Esophageal dysmotility)      Swallow Study  General Date of Onset: 10/29/15 HPI: Pt is a 82 y.o. male with a known history of Posttraumatic stress disorder, hypertension, coronary artery disease, COPD not on home oxygen, urinary retention with recent indwelling Foley catheter presents to the hospital for weakness. Patient stated his symptoms actually have been going on for 6 months now. He has had previous ER visits for the  same. He also expresses swallowing difficulty and decreased appetite and even after swallowing he feels that he has mid-sternal pain while the food tries to go down. Never had an EGD for the same. He has been on Bactrim and Levaquin recently as outpatient, he is not sure why. His labs indicated that he is hyponatremic, hyperkalemic and with acute renal failure. Pt stated drinking liquids is easiest but even those can cause mid-sternal discomfort for him. Pt is alert and oriented x3; follows all instruction.  Type of Study: Bedside Swallow Evaluation Previous Swallow Assessment: none indicated Diet Prior to this Study: Regular;Thin liquids (softer foods) Temperature Spikes Noted: No Respiratory Status: Room air History of Recent Intubation: No Behavior/Cognition: Alert;Cooperative;Pleasant mood (min. HOH) Oral Cavity Assessment: Within Functional Limits Oral Care Completed by SLP: Recent completion by staff Oral Cavity - Dentition: Dentures, top;Dentures, bottom Vision: Functional for self-feeding Self-Feeding Abilities: Able to feed self;Needs set up Patient Positioning: Upright in bed Baseline Vocal Quality: Normal Volitional Cough: Strong Volitional Swallow: Able to elicit    Oral/Motor/Sensory Function Overall Oral Motor/Sensory Function: Within functional limits   Ice Chips Ice chips: Not tested   Thin Liquid Thin Liquid: Within functional limits Presentation: Cup;Self Fed;Straw (~3-4 ozs total) Other Comments: slight-mold mid-sternal discomfort when drinking; aided by rest breaks    Nectar Thick Nectar Thick Liquid: Not tested   Honey Thick Honey Thick Liquid: Not tested   Puree Puree: Within functional limits Presentation: Self Fed;Spoon (5-6 trials)   Solid   GO   Solid: Within functional limits Presentation: Self Fed;Spoon (3 trials) Other Comments: slight-mold mid-sternal discomfort; aided by rest breaks       Daniel Som, MS,  CCC-SLP  Daniel Dickson 11/02/2015,3:27 PM

## 2015-11-02 NOTE — Clinical Social Work Note (Signed)
CSW department assistant was able to check [facility] benefits and this CSW was able to call [facility] and confirm that his stay with them was last year and that he will not be in any copayment days. Patient is agreeable to go to [facility] and had me call his daughter, Dennie Bible: [phone] and she is agreeable to this as well. [employer] Harvey will be obtained by [facility] and patient is to discharge there today. A 5 day LOG was approved by Financial risk analyst due to having to wait for [employer]. Kim at [facility] is aware and MD to discharge patient. Discharge information has been sent. Nurse to call report. CSW will see if patient's daughter can transport patient. York Spaniel MSW,LCSW [phone]

## 2015-11-02 NOTE — Clinical Social Work Placement (Signed)
   CLINICAL SOCIAL WORK PLACEMENT  NOTE  Date:  11/02/2015  Patient Details  Name: Daniel Dickson MRN: [id number] Date of Birth: 1912/08/18  Clinical Social Work is seeking post-discharge placement for this patient at the Skilled  Nursing Facility level of care (*CSW will initial, date and re-position this form in  chart as items are completed):  Yes   Patient/family provided with [place] Clinical Social Work Department's list of facilities offering this level of care within the geographic area requested by the patient (or if unable, by the patient's family).  Yes   Patient/family informed of their freedom to choose among providers that offer the needed level of care, that participate in Medicare, Medicaid or managed care program needed by the patient, have an available bed and are willing to accept the patient.  Yes   Patient/family informed of [facility]'s ownership interest in [facility] and [facility], as well as of the fact that they are under no obligation to receive care at these facilities.  PASRR submitted to EDS on       PASRR number received on       Existing PASRR number confirmed on 10/30/15     FL2 transmitted to all facilities in geographic area requested by pt/family on 10/30/15     FL2 transmitted to all facilities within larger geographic area on       Patient informed that his/her managed care company has contracts with or will negotiate with certain facilities, including the following:        Yes   Patient/family informed of bed offers received.  Patient chooses bed at  [facility])     Physician recommends and patient chooses bed at  [facility])    Patient to be transferred to  [facility]) on 11/02/15.  Patient to be transferred to facility by  (Patient's daughter: Dennie Bible)     Patient family notified on 11/02/15 of transfer.  Name of family member notified:   (Pat: patient's daughter)     PHYSICIAN       Additional Comment:     _______________________________________________ York Spaniel, LCSW 11/02/2015, 12:13 PM

## 2015-11-02 NOTE — Progress Notes (Signed)
11/02/2015 2:15 PM  Called report to Amil Amen, receiving nurse at [facility] where pt will be discharged today.  Bradly Chris, RN

## 2015-11-02 NOTE — Clinical Social Work Note (Signed)
Patient's daughter is requesting transport via EMS. Daughter aware that coverage is not assured by insurance.  York Spaniel MSW,LcSW [phone]

## 2015-11-02 NOTE — Care Management (Signed)
Patient to discharge to SNF.  Daniel Dickson with [employer] of discharge plan

## 2015-11-02 NOTE — Discharge Summary (Addendum)
[facility] - [place] at [facility]   PATIENT NAME: Daniel Dickson    MR#:  [id number]  DATE OF BIRTH:  Apr 14, 1912  DATE OF ADMISSION:  10/29/2015 ADMITTING PHYSICIAN: Enid Baas, MD  DATE OF DISCHARGE: No discharge date for patient encounter.  PRIMARY CARE PHYSICIAN: BLISS, Doreene Nest, MD     ADMISSION DIAGNOSIS:  Dehydration [E86.0] Acute renal injury (HCC) [N17.9] Dysphagia [R13.10]  DISCHARGE DIAGNOSIS:  Principal Problem:   ARF (acute renal failure) (HCC) Active Problems:   Hyperkalemia   Protein-calorie malnutrition, severe   Hypotension   Dysphagia, pharyngoesophageal phase   Esophageal spasm   Constipation   SIADH (syndrome of inappropriate ADH production) (HCC)   Bacteriuria with pyuria   Urinary retention due to benign prostatic hyperplasia   Chronic indwelling Foley catheter   SECONDARY DIAGNOSIS:   Past Medical History:  Diagnosis Date  . Anxiety   . COPD (chronic obstructive pulmonary disease) (HCC)   . Coronary artery disease    a. 2012 s/p MI/cardiac arrest--> PCI mRCA.  Marland Kitchen Hypertensive heart disease   . Mixed hyperlipidemia   . PTSD (post-traumatic stress disorder)    with headaches    .pro HOSPITAL COURSE:  The patient is 82 year old Caucasian male with past history significant for history of COPD, coronary artery disease, hyperlipidemia, posttraumatic stress disorder, who presents to the hospital with complaints of generalized weakness, difficulty swallowing, decreased appetite, constipation. He was also complaining of midsternal chest pain with swallowing, regurgitation. On arrival to the hospital patient had labs done which revealed acute renal failure with creatinine of 2.14, hyperkalemia of 5.5, hyponatremia of 128. Urinalysis revealed pyuria, urine culture showed 30,000 colony forming units of Staphylococcus species, carpal is negative staph, likely contamination. Patient exhibited no signs of infection, and it was  felt that 40 catheter use was responsible for this contamination. No antibiotics were given while patient was in the hospital. He remained afebrile and his [place] blood cell count was normal.  Patient was initiated on IV fluids, and his kidney function normalized. His sodium level also improved to 133, but then decreased back again to 128 on IV fluid administration, concerning for SIADH. At that point, the patient had urine osmolality checked, which was found to be high at 557. Fluid restriction was initiated. Patient was seen by physical therapist and recommended skilled nursing facility placement for rehabilitation, for which he was agreeable.  Discussion by problem: #1 acute renal failure  -Suspected prerenal, Resolved on IV fluid administration. Patient's oral intake has improved and he eats 100% off with meals here in the hospital, it is recommended to follow patient's oral intake closely, as well as his kidney function..  #2. Urinary retention, now with indwelling Foley catheter, patient is to continue finasteride and Flomax, follow-up with urologist for outpatient urodynamic studies  #3 Hyperkalemia, due to renal failure, resolved after discontinuation of bactrim , lisinopril   #4 hypotension-secondary to hypovolemia. Improved with IV fluid resuscitation.. Continue to hold patient's blood pressure medications, including metoprolol, Norvasc, lisinopril and Lasix, reinitiate them as outpatient if needed.   #5 dysphagia and malnutrition. Normal barium swallow study indicates likely esophageal spasm. The patient is to continue  dysphagia 3 diet with which he does well, although he still admits of intermittent esophageal spasms, now on Imdur,  which patient tells me helped with his symptoms.   #6. Generalized weakness- physical therapy consult was obtained while in the hospital, skilled nursing facility was recommended, patient will be discharged today  #7.  Constipation, likely big part of  patient's anorexia, patient is to continue milk of magnesia and MiraLAX, however, only  Enema given yesterday yielded results, he may need to have an enema when necessary.   #8. Failure to thrive, adult, malnutrition, weight loss, improved on Protonix, Imdur for dysphagia symptoms, still stimulants and laxatives for constipation, however, the patient may benefit from gastroenterologist input as outpatient, if anorexia continuous  #9 bacteriuria and pyuria, unlikely urinary tract infection, as patient is afebrile, and has normal [place] blood cell count, likely Foley catheter related, follow for any infectious signs  #10. History of hypertension, relatively hypotensive at present, all blood pressure medications as suspended, reinitiate them as needed  DISCHARGE CONDITIONS:   Stable  CONSULTS OBTAINED:    DRUG ALLERGIES:   Allergies  Allergen Reactions  . Cortisone Other (See Comments)    Unknown reaction  . Dye Fdc Red [Red Dye] Other (See Comments)    Unknown reaction.  . Peanut Oil     Other reaction(s): Unknown  . Peanut-Containing Drug Products     Other reaction(s): Unknown  . Penicillins     patient not sure of reaction    DISCHARGE MEDICATIONS:   Current Discharge Medication List    START taking these medications   Details  Alum & Mag Hydroxide-Simeth (GI COCKTAIL) SUSP suspension Take 30 mLs by mouth 3 (three) times daily as needed for indigestion. Shake well. Qty: 300 mL, Refills: 3    feeding supplement (BOOST / RESOURCE BREEZE) LIQD Take 1 Container by mouth 3 (three) times daily between meals. Qty: 90 Container, Refills: 5    isosorbide mononitrate (IMDUR) 30 MG 24 hr tablet Take 1 tablet (30 mg total) by mouth daily. Qty: 30 tablet, Refills: 5    magnesium hydroxide (MILK OF MAGNESIA) 400 MG/5ML suspension Take 30 mLs by mouth 2 (two) times daily as needed for mild constipation or moderate constipation. Qty: 360 mL, Refills: 0    mupirocin cream (BACTROBAN)  2 % Apply topically daily. Qty: 15 g, Refills: 0    ondansetron (ZOFRAN) 4 MG tablet Take 1 tablet (4 mg total) by mouth every 6 (six) hours as needed for nausea. Qty: 20 tablet, Refills: 0    pantoprazole (PROTONIX) 40 MG tablet Take 1 tablet (40 mg total) by mouth daily. Qty: 30 tablet, Refills: 5    polyethylene glycol (MIRALAX / GLYCOLAX) packet Take 17 g by mouth daily. Qty: 14 each, Refills: 0      CONTINUE these medications which have NOT CHANGED   Details  acetaminophen (TYLENOL) 500 MG tablet Take 500 mg by mouth daily as needed for mild pain or moderate pain.     albuterol (PROAIR HFA) 108 (90 BASE) MCG/ACT inhaler Inhale 2 puffs into the lungs every 6 (six) hours as needed. For wheezing.    albuterol (PROVENTIL) (2.5 MG/3ML) 0.083% nebulizer solution Take 3 mLs (2.5 mg total) by nebulization every 8 (eight) hours as needed for wheezing or shortness of breath. Qty: 75 mL, Refills: 12    aspirin EC 81 MG tablet Take 81 mg by mouth daily.    benzonatate (TESSALON) 200 MG capsule Take 200 mg by mouth 3 (three) times daily as needed for cough.    carisoprodol (SOMA) 350 MG tablet Take 1 tablet (350 mg total) by mouth 3 (three) times daily as needed for muscle spasms. Qty: 15 tablet, Refills: 0    Cholecalciferol (VITAMIN D3) 1000 UNITS CAPS Take 1,000 Units by mouth daily.  finasteride (PROSCAR) 5 MG tablet Take 1 tablet (5 mg total) by mouth daily. Qty: 15 tablet, Refills: 0    fluticasone furoate-vilanterol (BREO ELLIPTA) 100-25 MCG/INH AEPB Inhale 1 puff into the lungs daily. Reported on 10/19/2015    loratadine (CLARITIN) 10 MG tablet Take 10 mg by mouth daily.    Melatonin 5 MG CAPS Take 10 mg by mouth at bedtime.    nitroGLYCERIN (NITROSTAT) 0.4 MG SL tablet Place 0.4 mg under the tongue every 5 (five) minutes as needed for chest pain. Reported on 10/19/2015    pravastatin (PRAVACHOL) 80 MG tablet Take 80 mg by mouth daily.    tamsulosin (FLOMAX) 0.4 MG CAPS  capsule Take 0.4 mg by mouth daily. Take 30 minutes after same meal each day.    tiotropium (SPIRIVA) 18 MCG inhalation capsule Place 1 capsule (18 mcg total) into inhaler and inhale every evening. Qty: 30 capsule, Refills: 12      STOP taking these medications     amLODipine (NORVASC) 5 MG tablet      FUROSEMIDE PO      levofloxacin (LEVAQUIN) 250 MG tablet      lisinopril (PRINIVIL,ZESTRIL) 5 MG tablet      metoprolol tartrate (LOPRESSOR) 25 MG tablet      predniSONE (DELTASONE) 10 MG tablet      sulfamethoxazole-trimethoprim (BACTRIM DS,SEPTRA DS) 800-160 MG tablet          DISCHARGE INSTRUCTIONS:    Patient is to follow-up with primary care physician, urologist as outpatient, may benefit from a gastroenterologist evaluation and follow-up, follow oral intake, stool output  If you experience worsening of your admission symptoms, develop shortness of breath, life threatening emergency, suicidal or homicidal thoughts you must seek medical attention immediately by calling 911 or calling your MD immediately  if symptoms less severe.  You Must read complete instructions/literature along with all the possible adverse reactions/side effects for all the Medicines you take and that have been prescribed to you. Take any new Medicines after you have completely understood and accept all the possible adverse reactions/side effects.   Please note  You were cared for by a hospitalist during your hospital stay. If you have any questions about your discharge medications or the care you received while you were in the hospital after you are discharged, you can call the unit and asked to speak with the hospitalist on call if the hospitalist that took care of you is not available. Once you are discharged, your primary care physician will handle any further medical issues. Please note that NO REFILLS for any discharge medications will be authorized once you are discharged, as it is imperative that  you return to your primary care physician (or establish a relationship with a primary care physician if you do not have one) for your aftercare needs so that they can reassess your need for medications and monitor your lab values.    Today   CHIEF COMPLAINT:   Chief Complaint  Patient presents with  . Anorexia    HISTORY OF PRESENT ILLNESS:  Milind Raether  is a 82 y.o. male with a known history of COPD, coronary artery disease, hyperlipidemia, posttraumatic stress disorder, who presents to the hospital with complaints of generalized weakness, difficulty swallowing, decreased appetite, constipation. He was also complaining of midsternal chest pain with swallowing, regurgitation. On arrival to the hospital patient had labs done which revealed acute renal failure with creatinine of 2.14, hyperkalemia of 5.5, hyponatremia of 128. Urinalysis revealed pyuria, urine  culture showed 30,000 colony forming units of Staphylococcus species, carpal is negative staph, likely contamination. Patient exhibited no signs of infection, and it was felt that 40 catheter use was responsible for this contamination. No antibiotics were given while patient was in the hospital. He remained afebrile and his [place] blood cell count was normal.  Patient was initiated on IV fluids, and his kidney function normalized. His sodium level also improved to 133, but then decreased back again to 128 on IV fluid administration, concerning for SIADH. At that point, the patient had urine osmolality checked, which was found to be high at 557. Fluid restriction was initiated. Patient was seen by physical therapist and recommended skilled nursing facility placement for rehabilitation, for which he was agreeable.  Discussion by problem: #1 acute renal failure  -Suspected prerenal, Resolved on IV fluid administration. Patient's oral intake has improved and he eats 100% off with meals here in the hospital, it is recommended to follow patient's oral  intake closely, as well as his kidney function..  #2. Urinary retention, now with indwelling Foley catheter, patient is to continue finasteride and Flomax, follow-up with urologist for outpatient urodynamic studies  #3 Hyperkalemia, due to renal failure, resolved after discontinuation of bactrim , lisinopril   #4 hypotension-secondary to hypovolemia. Improved with IV fluid resuscitation.. Continue to hold patient's blood pressure medications, including metoprolol, Norvasc, lisinopril and Lasix, reinitiate them as outpatient if needed.   #5 dysphagia and malnutrition. Normal barium swallow study indicates likely esophageal spasm. The patient is to continue  dysphagia 3 diet with which he does well, although he still admits of intermittent esophageal spasms, now on Imdur,  which patient tells me helped with his symptoms.   #6. Generalized weakness- physical therapy consult was obtained while in the hospital, skilled nursing facility was recommended, patient will be discharged today  #7. Constipation, likely big part of patient's anorexia, patient is to continue milk of magnesia and MiraLAX, however, only  Enema given yesterday yielded results, he may need to have an enema when necessary.   #8. Failure to thrive, adult, malnutrition, weight loss, improved on Protonix, Imdur for dysphagia symptoms, still stimulants and laxatives for constipation, however, the patient may benefit from gastroenterologist input as outpatient, if anorexia continuous  #9 bacteriuria and pyuria, unlikely urinary tract infection, as patient is afebrile, and has normal [place] blood cell count, likely Foley catheter related, follow for any infectious signs  #10. History of hypertension, relatively hypotensive at present, all blood pressure medications as suspended, reinitiate them as needed    VITAL SIGNS:  Blood pressure 122/76, pulse 80, temperature 97.7 F (36.5 C), temperature source Oral, resp. rate 18, height  5\' 6"  (1.676 m), weight 57.7 kg (127 lb 1.6 oz), SpO2 98 %.  I/O:    Intake/Output Summary (Last 24 hours) at 11/02/15 1422 Last data filed at 11/02/15 1000  Gross per 24 hour  Intake              480 ml  Output              825 ml  Net             -345 ml    PHYSICAL EXAMINATION:  GENERAL:  82 y.o.-year-old patient lying in the bed with no acute distress.  EYES: Pupils equal, round, reactive to light and accommodation. No scleral icterus. Extraocular muscles intact.  HEENT: Head atraumatic, normocephalic. Oropharynx and nasopharynx clear.  NECK:  Supple, no jugular venous distention. No thyroid  enlargement, no tenderness.  LUNGS: Normal breath sounds bilaterally, no wheezing, rales,rhonchi or crepitation. No use of accessory muscles of respiration.  CARDIOVASCULAR: S1, S2 normal. No murmurs, rubs, or gallops.  ABDOMEN: Soft, non-tender, non-distended. Bowel sounds present. No organomegaly or mass.  EXTREMITIES: No pedal edema, cyanosis, or clubbing.  NEUROLOGIC: Cranial nerves II through XII are intact. Muscle strength 5/5 in all extremities. Sensation intact. Gait not checked.  PSYCHIATRIC: The patient is alert and oriented x 3.  SKIN: No obvious rash, lesion, or ulcer.   DATA REVIEW:   CBC  Recent Labs Lab 11/01/15 0609  WBC 5.4  HGB 13.0  HCT 37.4*  PLT 149*    Chemistries   Recent Labs Lab 10/29/15 1716  11/01/15 0609 11/02/15 0610  NA 128*  < > 129* 128*  K 5.5*  < > 4.5  --   CL 99*  < > 103  --   CO2 21*  < > 21*  --   GLUCOSE 114*  < > 100*  --   BUN 40*  < > 20  --   CREATININE 2.14*  < > 1.02  --   CALCIUM 8.9  < > 8.3*  --   AST 32  --   --   --   ALT 23  --   --   --   ALKPHOS 41  --   --   --   BILITOT 0.8  --   --   --   < > = values in this interval not displayed.  Cardiac Enzymes  Recent Labs Lab 10/29/15 1716  TROPONINI <0.03    Microbiology Results  Results for orders placed or performed during the hospital encounter of 10/29/15   Urine culture     Status: Abnormal   Collection Time: 10/29/15  4:53 PM  Result Value Ref Range Status   Specimen Description URINE, RANDOM  Final   Special Requests NONE  Final   Culture (A)  Final    30,000 COLONIES/mL STAPHYLOCOCCUS SPECIES (COAGULASE NEGATIVE)   Report Status 11/01/2015 FINAL  Final   Organism ID, Bacteria STAPHYLOCOCCUS SPECIES (COAGULASE NEGATIVE) (A)  Final      Susceptibility   Staphylococcus species (coagulase negative) - MIC*    CIPROFLOXACIN >=8 RESISTANT Resistant     GENTAMICIN <=0.5 SENSITIVE Sensitive     NITROFURANTOIN <=16 SENSITIVE Sensitive     OXACILLIN Value in next row Resistant      >=4 RESISTANTWARNING: For oxacillin-resistant S.aureus and coagulase-negative staphylococci (MRS), other beta-lactam agents, ie, penicillins, beta-lactam/beta-lactamase inhibitor combinations, cephems (with the exception of the cephalosporins with anti-MRSA activity), and carbapenems, may appear active in vitro, but are not effective clinically.  --CLSI, Vol.32 No.3, January 2012, pg 70.    TETRACYCLINE Value in next row Resistant      >=4 RESISTANTWARNING: For oxacillin-resistant S.aureus and coagulase-negative staphylococci (MRS), other beta-lactam agents, ie, penicillins, beta-lactam/beta-lactamase inhibitor combinations, cephems (with the exception of the cephalosporins with anti-MRSA activity), and carbapenems, may appear active in vitro, but are not effective clinically.  --CLSI, Vol.32 No.3, January 2012, pg 70.    VANCOMYCIN Value in next row Sensitive      >=4 RESISTANTWARNING: For oxacillin-resistant S.aureus and coagulase-negative staphylococci (MRS), other beta-lactam agents, ie, penicillins, beta-lactam/beta-lactamase inhibitor combinations, cephems (with the exception of the cephalosporins with anti-MRSA activity), and carbapenems, may appear active in vitro, but are not effective clinically.  --CLSI, Vol.32 No.3, January 2012, pg 70.    TRIMETH/SULFA Value in  next row  Resistant      >=4 RESISTANTWARNING: For oxacillin-resistant S.aureus and coagulase-negative staphylococci (MRS), other beta-lactam agents, ie, penicillins, beta-lactam/beta-lactamase inhibitor combinations, cephems (with the exception of the cephalosporins with anti-MRSA activity), and carbapenems, may appear active in vitro, but are not effective clinically.  --CLSI, Vol.32 No.3, January 2012, pg 70.    CLINDAMYCIN Value in next row Resistant      >=4 RESISTANTWARNING: For oxacillin-resistant S.aureus and coagulase-negative staphylococci (MRS), other beta-lactam agents, ie, penicillins, beta-lactam/beta-lactamase inhibitor combinations, cephems (with the exception of the cephalosporins with anti-MRSA activity), and carbapenems, may appear active in vitro, but are not effective clinically.  --CLSI, Vol.32 No.3, January 2012, pg 70.    RIFAMPIN Value in next row Sensitive      >=4 RESISTANTWARNING: For oxacillin-resistant S.aureus and coagulase-negative staphylococci (MRS), other beta-lactam agents, ie, penicillins, beta-lactam/beta-lactamase inhibitor combinations, cephems (with the exception of the cephalosporins with anti-MRSA activity), and carbapenems, may appear active in vitro, but are not effective clinically.  --CLSI, Vol.32 No.3, January 2012, pg 70.    * 30,000 COLONIES/mL STAPHYLOCOCCUS SPECIES (COAGULASE NEGATIVE)    RADIOLOGY:  No results found.  EKG:   Orders placed or performed during the hospital encounter of 10/29/15  . EKG 12-Lead  . EKG 12-Lead      Management plans discussed with the patient, family and they are in agreement.  CODE STATUS:     Code Status Orders        Start     Ordered   10/29/15 2120  Full code  Continuous     10/29/15 2119    Code Status History    Date Active Date Inactive Code Status Order ID Comments User Context   10/07/2015  7:32 AM 10/09/2015  4:29 PM Full Code [id number]  Ihor Austin, MD Inpatient   06/07/2015 12:04 AM  06/10/2015  4:31 PM Full Code [id number]  Oralia Manis, MD Inpatient   01/11/2015  1:54 PM 01/12/2015  1:58 PM Full Code [id number]  Alford Highland, MD ED    Advance Directive Documentation   Flowsheet Row Most Recent Value  Type of Advance Directive  Healthcare Power of Attorney  Pre-existing out of facility DNR order (yellow form or pink MOST form)  No data  "MOST" Form in Place?  No data      TOTAL TIME TAKING CARE OF THIS PATIENT: 40  minutes.    Katharina Caper M.D on 11/02/2015 at 2:22 PM  Between 7am to 6pm - Pager - [phone]  After 6pm go to [email or web address] - password [email or web address] [facility]  [facility]  Office  [phone]  CC: Primary care physician; [name], MD

## 2015-11-02 NOTE — Progress Notes (Signed)
11/02/2015 4:34 PM  Ricki Rodriguez Fugere to be D/C'd Skilled nursing facility per MD order.  Discussed prescriptions and follow up appointments with the patient. Prescriptions given to patient, medication list explained in detail. Pt verbalized understanding.    Medication List    STOP taking these medications   amLODipine 5 MG tablet Commonly known as:  NORVASC   FUROSEMIDE PO   levofloxacin 250 MG tablet Commonly known as:  LEVAQUIN   lisinopril 5 MG tablet Commonly known as:  PRINIVIL,ZESTRIL   metoprolol tartrate 25 MG tablet Commonly known as:  LOPRESSOR   predniSONE 10 MG tablet Commonly known as:  DELTASONE   sulfamethoxazole-trimethoprim 800-160 MG tablet Commonly known as:  BACTRIM DS,SEPTRA DS     TAKE these medications   acetaminophen 500 MG tablet Commonly known as:  TYLENOL Take 500 mg by mouth daily as needed for mild pain or moderate pain.   aspirin EC 81 MG tablet Take 81 mg by mouth daily.   benzonatate 200 MG capsule Commonly known as:  TESSALON Take 200 mg by mouth 3 (three) times daily as needed for cough.   BREO ELLIPTA 100-25 MCG/INH Aepb Generic drug:  fluticasone furoate-vilanterol Inhale 1 puff into the lungs daily. Reported on 10/19/2015   carisoprodol 350 MG tablet Commonly known as:  SOMA Take 1 tablet (350 mg total) by mouth 3 (three) times daily as needed for muscle spasms.   feeding supplement Liqd Take 1 Container by mouth 3 (three) times daily between meals.   finasteride 5 MG tablet Commonly known as:  PROSCAR Take 1 tablet (5 mg total) by mouth daily.   gi cocktail Susp suspension Take 30 mLs by mouth 3 (three) times daily as needed for indigestion. Shake well.   isosorbide mononitrate 30 MG 24 hr tablet Commonly known as:  IMDUR Take 1 tablet (30 mg total) by mouth daily.   loratadine 10 MG tablet Commonly known as:  CLARITIN Take 10 mg by mouth daily.   magnesium hydroxide 400 MG/5ML suspension Commonly known as:   MILK OF MAGNESIA Take 30 mLs by mouth 2 (two) times daily as needed for mild constipation or moderate constipation.   Melatonin 5 MG Caps Take 10 mg by mouth at bedtime.   mupirocin cream 2 % Commonly known as:  BACTROBAN Apply topically daily.   nitroGLYCERIN 0.4 MG SL tablet Commonly known as:  NITROSTAT Place 0.4 mg under the tongue every 5 (five) minutes as needed for chest pain. Reported on 10/19/2015   ondansetron 4 MG tablet Commonly known as:  ZOFRAN Take 1 tablet (4 mg total) by mouth every 6 (six) hours as needed for nausea.   pantoprazole 40 MG tablet Commonly known as:  PROTONIX Take 1 tablet (40 mg total) by mouth daily.   polyethylene glycol packet Commonly known as:  MIRALAX / GLYCOLAX Take 17 g by mouth daily.   pravastatin 80 MG tablet Commonly known as:  PRAVACHOL Take 80 mg by mouth daily.   PROAIR HFA 108 (90 Base) MCG/ACT inhaler Generic drug:  albuterol Inhale 2 puffs into the lungs every 6 (six) hours as needed. For wheezing.   albuterol (2.5 MG/3ML) 0.083% nebulizer solution Commonly known as:  PROVENTIL Take 3 mLs (2.5 mg total) by nebulization every 8 (eight) hours as needed for wheezing or shortness of breath.   tamsulosin 0.4 MG Caps capsule Commonly known as:  FLOMAX Take 0.4 mg by mouth daily. Take 30 minutes after same meal each day.   tiotropium 18  MCG inhalation capsule Commonly known as:  SPIRIVA Place 1 capsule (18 mcg total) into inhaler and inhale every evening.   Vitamin D3 1000 units Caps Take 1,000 Units by mouth daily.       Vitals:   11/02/15 0552 11/02/15 1315  BP: 130/68 122/76  Pulse: 89 80  Resp: 18 18  Temp: 97.9 F (36.6 C) 97.7 F (36.5 C)    Skin clean, dry and intact without evidence of skin break down, no evidence of skin tears noted. IV catheter discontinued intact. Site without signs and symptoms of complications. Dressing and pressure applied. Pt denies pain at this time. No complaints noted.  An  After Visit Summary was printed and given to the patient. Patient escorted via EMS, and D/C to SNF.  Bradly Chris

## 2015-11-05 DIAGNOSIS — E871 Hypo-osmolality and hyponatremia: Secondary | ICD-10-CM | POA: Diagnosis present

## 2015-11-05 LAB — URINALYSIS COMPLETE WITH MICROSCOPIC (ARMC ONLY)
Bilirubin Urine: NEGATIVE
Glucose, UA: NEGATIVE mg/dL
Hgb urine dipstick: NEGATIVE
Ketones, ur: NEGATIVE mg/dL
Leukocytes, UA: NEGATIVE
Nitrite: NEGATIVE
Protein, ur: NEGATIVE mg/dL
Specific Gravity, Urine: 1.006 (ref 1.005–1.030)
Squamous Epithelial / HPF: NONE SEEN
pH: 7 (ref 5.0–8.0)

## 2015-11-05 LAB — CBC WITH DIFFERENTIAL/PLATELET
Basophils Absolute: 0 K/uL (ref 0–0.1)
Basophils Relative: 1 %
Eosinophils Absolute: 0.2 K/uL (ref 0–0.7)
Eosinophils Relative: 4 %
HCT: 37 % — ABNORMAL LOW (ref 40.0–52.0)
Hemoglobin: 12.9 g/dL — ABNORMAL LOW (ref 13.0–18.0)
Lymphocytes Relative: 27 %
Lymphs Abs: 1.4 K/uL (ref 1.0–3.6)
MCH: 34.1 pg — ABNORMAL HIGH (ref 26.0–34.0)
MCHC: 34.9 g/dL (ref 32.0–36.0)
MCV: 97.7 fL (ref 80.0–100.0)
Monocytes Absolute: 0.5 K/uL (ref 0.2–1.0)
Monocytes Relative: 10 %
Neutro Abs: 3 K/uL (ref 1.4–6.5)
Neutrophils Relative %: 58 %
Platelets: 266 K/uL (ref 150–440)
RBC: 3.78 MIL/uL — ABNORMAL LOW (ref 4.40–5.90)
RDW: 13.9 % (ref 11.5–14.5)
WBC: 5.1 K/uL (ref 3.8–10.6)

## 2015-11-05 LAB — COMPREHENSIVE METABOLIC PANEL
ALT: 32 U/L (ref 17–63)
AST: 34 U/L (ref 15–41)
Albumin: 2.8 g/dL — ABNORMAL LOW (ref 3.5–5.0)
Alkaline Phosphatase: 64 U/L (ref 38–126)
Anion gap: 6 (ref 5–15)
BUN: 21 mg/dL — AB (ref 6–20)
CHLORIDE: 101 mmol/L (ref 101–111)
CO2: 23 mmol/L (ref 22–32)
Calcium: 9 mg/dL (ref 8.9–10.3)
Creatinine, Ser: 1.22 mg/dL (ref 0.61–1.24)
GFR calc Af Amer: 54 mL/min — ABNORMAL LOW (ref >60)
GFR calc non Af Amer: 46 mL/min — ABNORMAL LOW (ref >60)
GLUCOSE: 123 mg/dL — AB (ref 65–99)
Potassium: 4.1 mmol/L (ref 3.5–5.1)
SODIUM: 130 mmol/L — AB (ref 135–145)
Total Bilirubin: 0.5 mg/dL (ref 0.3–1.2)
Total Protein: 5.3 g/dL — ABNORMAL LOW (ref 6.5–8.1)

## 2015-11-06 LAB — URINE CULTURE

## 2015-11-10 ENCOUNTER — Encounter
Admission: RE | Admit: 2015-11-10 | Discharge: 2015-11-10 | Disposition: A | Discharge status: Home / Self Care | Payer: Medicare PPO | Source: Ambulatory Visit | Attending: Internal Medicine | Admitting: Internal Medicine

## 2015-11-10 ENCOUNTER — Ambulatory Visit (INDEPENDENT_AMBULATORY_CARE_PROVIDER_SITE_OTHER): Payer: Medicare PPO | Admitting: Urology

## 2015-11-10 ENCOUNTER — Encounter: Payer: Self-pay | Admitting: Urology

## 2015-11-10 VITALS — BP 91/62 | HR 94 | Ht 66.0 in | Wt 126.8 lb

## 2015-11-10 DIAGNOSIS — R339 Retention of urine, unspecified: Secondary | ICD-10-CM

## 2015-11-10 NOTE — Progress Notes (Signed)
11/10/2015 9:54 AM   Daniel Dickson [birth year] [id number]  Referring provider: [name], MD [address] [place], [place] [address]  Chief Complaint  Patient presents with  . Urinary Retention    HPI: 82 year old male who presents today for a voiding trial. The patient was recently admitted to the hospital for COPD exacerbation. At that time he started to be in urinary retention. Foley catheter subsequent replaced. The patient has baseline obstructive voiding symptoms, and was taking tamsulosin. He subsequent was started on finasteride while in the hospital. The patient does not have a history of urinary retention. He has no history of recurrent urinary tract infections. He has a history of gross hematuria or dysuria.  Interval hx: -The patient is here for a voiding trial today. He has failed multiple voiding trials. He is not interested in another voiding trial. He is happy that he no longer gets up at night.    PMH: Past Medical History:  Diagnosis Date  . Anxiety   . COPD (chronic obstructive pulmonary disease) (HCC)   . Coronary artery disease    a. 2012 s/p MI/cardiac arrest--> PCI mRCA.  Marland Kitchen Hypertensive heart disease   . Mixed hyperlipidemia   . PTSD (post-traumatic stress disorder)    with headaches    Surgical History: Past Surgical History:  Procedure Laterality Date  . CATARACT EXTRACTION    . CHOLECYSTECTOMY    . CORONARY ANGIOPLASTY     with stent; [facility]  . HAND SURGERY    . HEMORRHOID SURGERY    . HIP SURGERY    . VASCULAR SURGERY     stent R femoral artery    Home Medications:    Medication List       Accurate as of 11/10/15  9:54 AM. Always use your most recent med list.          acetaminophen 500 MG tablet Commonly known as:  TYLENOL Take 500 mg by mouth daily as needed for mild pain or moderate pain.   aspirin EC 81 MG tablet Take 81 mg by mouth daily.   benzonatate 200 MG capsule Commonly known as:  TESSALON Take 200 mg by  mouth 3 (three) times daily as needed for cough.   BREO ELLIPTA 100-25 MCG/INH Aepb Generic drug:  fluticasone furoate-vilanterol Inhale 1 puff into the lungs daily. Reported on 10/19/2015   carisoprodol 350 MG tablet Commonly known as:  SOMA Take 1 tablet (350 mg total) by mouth 3 (three) times daily as needed for muscle spasms.   feeding supplement Liqd Take 1 Container by mouth 3 (three) times daily between meals.   finasteride 5 MG tablet Commonly known as:  PROSCAR Take 1 tablet (5 mg total) by mouth daily.   gi cocktail Susp suspension Take 30 mLs by mouth 3 (three) times daily as needed for indigestion. Shake well.   isosorbide mononitrate 30 MG 24 hr tablet Commonly known as:  IMDUR Take 1 tablet (30 mg total) by mouth daily.   loratadine 10 MG tablet Commonly known as:  CLARITIN Take 10 mg by mouth daily.   magnesium hydroxide 400 MG/5ML suspension Commonly known as:  MILK OF MAGNESIA Take 30 mLs by mouth 2 (two) times daily as needed for mild constipation or moderate constipation.   Melatonin 5 MG Caps Take 10 mg by mouth at bedtime.   mupirocin cream 2 % Commonly known as:  BACTROBAN Apply topically daily.   nitroGLYCERIN 0.4 MG SL tablet Commonly known as:  NITROSTAT  Place 0.4 mg under the tongue every 5 (five) minutes as needed for chest pain. Reported on 10/19/2015   ondansetron 4 MG tablet Commonly known as:  ZOFRAN Take 1 tablet (4 mg total) by mouth every 6 (six) hours as needed for nausea.   pantoprazole 40 MG tablet Commonly known as:  PROTONIX Take 1 tablet (40 mg total) by mouth daily.   polyethylene glycol packet Commonly known as:  MIRALAX / GLYCOLAX Take 17 g by mouth daily.   pravastatin 80 MG tablet Commonly known as:  PRAVACHOL Take 80 mg by mouth daily.   PROAIR HFA 108 (90 Base) MCG/ACT inhaler Generic drug:  albuterol Inhale 2 puffs into the lungs every 6 (six) hours as needed. For wheezing.   albuterol (2.5 MG/3ML) 0.083%  nebulizer solution Commonly known as:  PROVENTIL Take 3 mLs (2.5 mg total) by nebulization every 8 (eight) hours as needed for wheezing or shortness of breath.   tamsulosin 0.4 MG Caps capsule Commonly known as:  FLOMAX Take 0.4 mg by mouth daily. Take 30 minutes after same meal each day.   tiotropium 18 MCG inhalation capsule Commonly known as:  SPIRIVA Place 1 capsule (18 mcg total) into inhaler and inhale every evening.   Vitamin D3 1000 units Caps Take 1,000 Units by mouth daily.       Allergies:  Allergies  Allergen Reactions  . Cortisone Other (See Comments)    Other reaction(s): Other (See Comments) GI Upset Unknown reaction  . Dye Fdc Red [Red Dye] Other (See Comments)    Unknown reaction.  . Iodine (Kelp)  [Iodine]     Other reaction(s): Other (See Comments) GI Upset  . Peanut Oil     Other reaction(s): Unknown  . Peanut-Containing Drug Products     Other reaction(s): Unknown  . Penicillins Rash    GI Upset patient not sure of reaction    Family History: Family History  Problem Relation Age of Onset  . COPD Father     Social History:  reports that he has quit smoking. His smoking use included Cigarettes. He has a 120.00 pack-year smoking history. He does not have any smokeless tobacco history on file. He reports that he drinks alcohol. He reports that he does not use drugs.  ROS: UROLOGY Frequent Urination?: No Hard to postpone urination?: No Burning/pain with urination?: Yes Get up at night to urinate?: Yes Leakage of urine?: No Urine stream starts and stops?: No Trouble starting stream?: No Do you have to strain to urinate?: No Blood in urine?: No Urinary tract infection?: No Sexually transmitted disease?: No Injury to kidneys or bladder?: No Painful intercourse?: No Weak stream?: No Erection problems?: No Penile pain?: No  Gastrointestinal Nausea?: No Vomiting?: No Indigestion/heartburn?: No Diarrhea?: No Constipation?:  No  Constitutional Fever: No Night sweats?: No Weight loss?: No Fatigue?: No  Skin Skin rash/lesions?: No Itching?: No  Eyes Blurred vision?: No Double vision?: No  Ears/Nose/Throat Sore throat?: No Sinus problems?: No  Hematologic/Lymphatic Swollen glands?: No Easy bruising?: No  Cardiovascular Leg swelling?: No Chest pain?: No  Respiratory Cough?: No Shortness of breath?: No  Endocrine Excessive thirst?: No  Musculoskeletal Back pain?: No Joint pain?: No  Neurological Headaches?: No Dizziness?: No  Psychologic Depression?: No Anxiety?: No  Physical Exam: BP 91/62   Pulse 94   Ht  (1.676 m)   Wt 57.5 kg (126 lb 12.8 oz)   BMI 20.47 kg/m   Constitutional:  Alert and oriented, No acute distress.  HEENT: [place] AT, moist mucus membranes.  Trachea midline, no masses. Cardiovascular: No clubbing, cyanosis, or edema. Respiratory: Normal respiratory effort, no increased work of breathing. GI: Abdomen is soft, nontender, nondistended, no abdominal masses GU: No CVA tenderness.  Skin: No rashes, bruises or suspicious lesions. Lymph: No cervical or inguinal adenopathy. Neurologic: Grossly intact, no focal deficits, moving all 4 extremities. Psychiatric: Normal mood and affect.  Laboratory Data: Lab Results  Component Value Date   WBC 5.1 11/05/2015   HGB 12.9 (L) 11/05/2015   HCT 37.0 (L) 11/05/2015   MCV 97.7 11/05/2015   PLT 266 11/05/2015    Lab Results  Component Value Date   CREATININE 1.22 11/05/2015    No results found for: PSA  No results found for: TESTOSTERONE  No results found for: HGBA1C  Urinalysis    Component Value Date/Time   COLORURINE STRAW (A) 11/05/2015 0900   APPEARANCEUR CLEAR (A) 11/05/2015 0900   LABSPEC 1.006 11/05/2015 0900   PHURINE 7.0 11/05/2015 0900   GLUCOSEU NEGATIVE 11/05/2015 0900   HGBUR NEGATIVE 11/05/2015 0900   BILIRUBINUR NEGATIVE 11/05/2015 0900   KETONESUR NEGATIVE 11/05/2015 0900    PROTEINUR NEGATIVE 11/05/2015 0900   NITRITE NEGATIVE 11/05/2015 0900   LEUKOCYTESUR NEGATIVE 11/05/2015 0900    Pertinent Imaging: none  Assessment & Plan:    1. Urinary retention -continue indwelling foley  -continue monthly foley changes -RTC 6 months  There are no diagnoses linked to this encounter.  No Follow-up on file.  Wilkie Aye, MD  [facility] [address] [place], [place] [address] [phone]

## 2015-11-13 ENCOUNTER — Non-Acute Institutional Stay (SKILLED_NURSING_FACILITY): Payer: Medicare PPO | Admitting: Gerontology

## 2015-11-13 DIAGNOSIS — N2889 Other specified disorders of kidney and ureter: Secondary | ICD-10-CM | POA: Diagnosis not present

## 2015-11-13 DIAGNOSIS — N401 Enlarged prostate with lower urinary tract symptoms: Secondary | ICD-10-CM

## 2015-11-13 DIAGNOSIS — R338 Other retention of urine: Secondary | ICD-10-CM

## 2015-11-13 DIAGNOSIS — N179 Acute kidney failure, unspecified: Secondary | ICD-10-CM | POA: Diagnosis not present

## 2015-11-14 NOTE — Progress Notes (Signed)
Location:      Place of Service:  SNF (31)  Provider: Lorenso Quarry, NP-C  PCP: [name], MD Patient Care Team: [name], MD as PCP - General (Family Medicine) Antonieta Iba, MD as Consulting Physician (Cardiology)  Extended Emergency Contact Information Primary Emergency Contact: Armstrong,Patricia Address: [address]          [place], [place] [address] [place] Home Phone: [phone] Relation: Daughter Secondary Emergency Contact: Ruthy Dick [place] of [place] Home Phone: [phone] Relation: Son  Code Status: DNR Goals of care:  Advanced Directive information Advanced Directives 10/29/2015  Does patient have an advance directive? Yes  Type of Advance Directive Healthcare Power of Attorney  Does patient want to make changes to advanced directive? No - Patient declined  Copy of advanced directive(s) in chart? No - copy requested  Would patient like information on creating an advanced directive? No - patient declined information     Allergies  Allergen Reactions  . Cortisone Other (See Comments)    Other reaction(s): Other (See Comments) GI Upset Unknown reaction  . Dye Fdc Red [Red Dye] Other (See Comments)    Unknown reaction.  . Iodine (Kelp)  [Iodine]     Other reaction(s): Other (See Comments) GI Upset  . Peanut Oil     Other reaction(s): Unknown  . Peanut-Containing Drug Products     Other reaction(s): Unknown  . Penicillins Rash    GI Upset patient not sure of reaction    Chief Complaint  Patient presents with  . Discharge Note    HPI:  82 y.o. male was admitted to the facility for rehab following hospitalization for acute renal failure, dehydration and urinary retention. Pt progressed well with therapy. He feels he is now ready for discharge. Pt failed foley removal attempt. Foley was reinserted and pt was assessed by urology. The decision was made to leave foley in place and follow up with urologist in  6 months. Pt reports pain has been well controlled, no dyspnea. No other complaints. VSS      Past Medical History:  Diagnosis Date  . Anxiety   . COPD (chronic obstructive pulmonary disease) (HCC)   . Coronary artery disease    a. 2012 s/p MI/cardiac arrest--> PCI mRCA.  Marland Kitchen Hypertensive heart disease   . Mixed hyperlipidemia   . PTSD (post-traumatic stress disorder)    with headaches    Past Surgical History:  Procedure Laterality Date  . CATARACT EXTRACTION    . CHOLECYSTECTOMY    . CORONARY ANGIOPLASTY     with stent; [facility]  . HAND SURGERY    . HEMORRHOID SURGERY    . HIP SURGERY    . VASCULAR SURGERY     stent R femoral artery      reports that he has quit smoking. His smoking use included Cigarettes. He has a 120.00 pack-year smoking history. He does not have any smokeless tobacco history on file. He reports that he drinks alcohol. He reports that he does not use drugs. Social History   Social History  . Marital status: Widowed    Spouse name: N/A  . Number of children: N/A  . Years of education: N/A   Occupational History  . retired    Social History Main Topics  . Smoking status: Former Smoker    Packs/day: 3.00    Years: 40.00    Types: Cigarettes  . Smokeless tobacco: Not on file  . Alcohol  use Yes     Comment: wine at night.   . Drug use: No  . Sexual activity: Not on file   Other Topics Concern  . Not on file   Social History Narrative  . No narrative on file   Functional Status Survey:    Allergies  Allergen Reactions  . Cortisone Other (See Comments)    Other reaction(s): Other (See Comments) GI Upset Unknown reaction  . Dye Fdc Red [Red Dye] Other (See Comments)    Unknown reaction.  . Iodine (Kelp)  [Iodine]     Other reaction(s): Other (See Comments) GI Upset  . Peanut Oil     Other reaction(s): Unknown  . Peanut-Containing Drug Products     Other reaction(s): Unknown  . Penicillins Rash    GI Upset patient not sure of  reaction    Pertinent  Health Maintenance Due  Topic Date Due  . PNA vac Low Risk Adult (1 of 2 - PCV13) 05/14/1979  . INFLUENZA VACCINE  11/10/2015    Medications:   Medication List       Accurate as of 11/13/15 11:59 PM. Always use your most recent med list.          acetaminophen 500 MG tablet Commonly known as:  TYLENOL Take 500 mg by mouth daily as needed for mild pain or moderate pain.   aspirin EC 81 MG tablet Take 81 mg by mouth daily.   benzonatate 200 MG capsule Commonly known as:  TESSALON Take 200 mg by mouth 3 (three) times daily as needed for cough.   BREO ELLIPTA 100-25 MCG/INH Aepb Generic drug:  fluticasone furoate-vilanterol Inhale 1 puff into the lungs daily. Reported on 10/19/2015   carisoprodol 350 MG tablet Commonly known as:  SOMA Take 1 tablet (350 mg total) by mouth 3 (three) times daily as needed for muscle spasms.   feeding supplement Liqd Take 1 Container by mouth 3 (three) times daily between meals.   finasteride 5 MG tablet Commonly known as:  PROSCAR Take 1 tablet (5 mg total) by mouth daily.   gi cocktail Susp suspension Take 30 mLs by mouth 3 (three) times daily as needed for indigestion. Shake well.   isosorbide mononitrate 30 MG 24 hr tablet Commonly known as:  IMDUR Take 1 tablet (30 mg total) by mouth daily.   loratadine 10 MG tablet Commonly known as:  CLARITIN Take 10 mg by mouth daily.   magnesium hydroxide 400 MG/5ML suspension Commonly known as:  MILK OF MAGNESIA Take 30 mLs by mouth 2 (two) times daily as needed for mild constipation or moderate constipation.   Melatonin 5 MG Caps Take 10 mg by mouth at bedtime.   mupirocin cream 2 % Commonly known as:  BACTROBAN Apply topically daily.   nitroGLYCERIN 0.4 MG SL tablet Commonly known as:  NITROSTAT Place 0.4 mg under the tongue every 5 (five) minutes as needed for chest pain. Reported on 10/19/2015   ondansetron 4 MG tablet Commonly known as:  ZOFRAN Take 1  tablet (4 mg total) by mouth every 6 (six) hours as needed for nausea.   pantoprazole 40 MG tablet Commonly known as:  PROTONIX Take 1 tablet (40 mg total) by mouth daily.   polyethylene glycol packet Commonly known as:  MIRALAX / GLYCOLAX Take 17 g by mouth daily.   pravastatin 80 MG tablet Commonly known as:  PRAVACHOL Take 80 mg by mouth daily.   PROAIR HFA 108 (90 Base) MCG/ACT inhaler Generic drug:  albuterol Inhale 2 puffs into the lungs every 6 (six) hours as needed. For wheezing.   albuterol (2.5 MG/3ML) 0.083% nebulizer solution Commonly known as:  PROVENTIL Take 3 mLs (2.5 mg total) by nebulization every 8 (eight) hours as needed for wheezing or shortness of breath.   tamsulosin 0.4 MG Caps capsule Commonly known as:  FLOMAX Take 0.4 mg by mouth daily. Take 30 minutes after same meal each day.   tiotropium 18 MCG inhalation capsule Commonly known as:  SPIRIVA Place 1 capsule (18 mcg total) into inhaler and inhale every evening.   Vitamin D3 1000 units Caps Take 1,000 Units by mouth daily.       Review of Systems  Constitutional: Negative for activity change, appetite change, chills, diaphoresis and fever.  HENT: Negative for congestion, sneezing, sore throat, trouble swallowing and voice change.   Eyes: Negative for pain, redness and visual disturbance.  Respiratory: Negative for apnea, cough, choking, chest tightness, shortness of breath and wheezing.   Cardiovascular: Negative for chest pain, palpitations and leg swelling.  Gastrointestinal: Negative for abdominal distention, abdominal pain, constipation, diarrhea and nausea.  Genitourinary: Positive for decreased urine volume, difficulty urinating and dysuria. Negative for frequency and urgency.  Musculoskeletal: Positive for arthralgias (typical arthritis). Negative for back pain, gait problem and myalgias.  Skin: Negative for color change, pallor, rash and wound.  Neurological: Negative for dizziness,  tremors, syncope, speech difficulty, weakness, numbness and headaches.  Psychiatric/Behavioral: Negative for agitation and behavioral problems.  All other systems reviewed and are negative.   Vitals:   11/14/15 0121  BP: (!) 142/82  Pulse: 85  Resp: 17  Temp: 97.6 F (36.4 C)  SpO2: 98%   There is no height or weight on file to calculate BMI. Physical Exam  Constitutional: He is oriented to person, place, and time. Vital signs are normal. He appears well-developed and well-nourished. He is active and cooperative. He does not appear ill. No distress.  HENT:  Head: Normocephalic and atraumatic.  Mouth/Throat: Uvula is midline, oropharynx is clear and moist and mucous membranes are normal. Mucous membranes are not pale, not dry and not cyanotic.  Eyes: Conjunctivae, EOM and lids are normal. Pupils are equal, round, and reactive to light.  Neck: Trachea normal, normal range of motion and full passive range of motion without pain. Neck supple. No JVD present. No tracheal deviation, no edema and no erythema present. No thyromegaly present.  Cardiovascular: Normal rate, regular rhythm, normal heart sounds, intact distal pulses and normal pulses.  Exam reveals no gallop, no distant heart sounds and no friction rub.   No murmur heard. Pulmonary/Chest: Effort normal and breath sounds normal. No accessory muscle usage. No respiratory distress. He has no wheezes. He has no rales. He exhibits no tenderness.  Abdominal: Soft. Normal appearance and bowel sounds are normal. He exhibits no distension and no ascites. There is no tenderness.  Musculoskeletal: Normal range of motion. He exhibits no edema or tenderness.  Expected osteoarthritis, stiffness  Neurological: He is alert and oriented to person, place, and time. He has normal strength.  Skin: Skin is warm, dry and intact. No rash noted. He is not diaphoretic. No cyanosis or erythema. No pallor. Nails show no clubbing.  Psychiatric: He has a normal  mood and affect. His speech is normal and behavior is normal. Judgment and thought content normal. Cognition and memory are normal.  Nursing note and vitals reviewed.   Labs reviewed: Basic Metabolic Panel:  Recent Labs  16/10/96 0540  11/01/15 0609 11/02/15 0610 11/05/15 1150  NA 131* 129* 128* 130*  K 4.6 4.5  --  4.1  CL 107 103  --  101  CO2 19* 21*  --  23  GLUCOSE 75 100*  --  123*  BUN 24* 20  --  21*  CREATININE 1.21 1.02  --  1.22  CALCIUM 8.2* 8.3*  --  9.0   Liver Function Tests:  Recent Labs  10/29/15 1716 11/05/15 1150  AST 32 34  ALT 23 32  ALKPHOS 41 64  BILITOT 0.8 0.5  PROT 5.7* 5.3*  ALBUMIN 3.2* 2.8*   No results for input(s): LIPASE, AMYLASE in the last 8760 hours. No results for input(s): AMMONIA in the last 8760 hours. CBC:  Recent Labs  10/20/15 2226 10/29/15 1716 10/30/15 0440 11/01/15 0609 11/05/15 1150  WBC 12.8* 8.8 6.2 5.4 5.1  NEUTROABS 10.9* 6.9*  --   --  3.0  HGB 13.9 13.6 12.6* 13.0 12.9*  HCT 40.7 39.1* 36.6* 37.4* 37.0*  MCV 96.4 99.0 98.5 97.9 97.7  PLT 171 140* 112* 149* 266   Cardiac Enzymes:  Recent Labs  06/07/15 1155 10/07/15 0245 10/29/15 1716  TROPONINI 0.04* <0.03 <0.03   BNP: Invalid input(s): POCBNP CBG: No results for input(s): GLUCAP in the last 8760 hours.  Procedures and Imaging Studies During Stay: Dg Chest 2 View  Result Date: 10/29/2015 CLINICAL DATA:  Dysphagia.  Hypertension. EXAM: CHEST  2 VIEW COMPARISON:  10/07/2015 and 06/06/2015 FINDINGS: Lungs are adequately inflated demonstrate small posterior layering pleural effusions bilaterally. No focal airspace process. Cardiomediastinal silhouette is within normal. There is calcified plaque over the thoracic aorta. There are degenerative changes of the spine. IMPRESSION: Small bilateral pleural effusions. Aortic atherosclerosis. Electronically Signed   By: Elberta Fortis M.D.   On: 10/29/2015 18:33   Dg Pelvis 1-2 Views  Result Date:  10/24/2015 CLINICAL DATA:  82 year old male with acute right hip pain following fall yesterday. Initial encounter. History of right hip fracture. EXAM: PELVIS - 1-2 VIEW COMPARISON:  None. FINDINGS: Screws within the proximal right femur are again noted. There is no evidence of acute fracture, subluxation or dislocation. Diffuse osteopenia is noted. IMPRESSION: No evidence of acute bony abnormality. Proximal right femur fixation screws again noted. Electronically Signed   By: Harmon Pier M.D.   On: 10/24/2015 15:58   Ct Pelvis Wo Contrast  Result Date: 10/24/2015 CLINICAL DATA:  Patient fell yesterday. Patient denies pain except for when he tries to move his right leg then he has pain in his lower back. Patient denies LOC or hitting head, states that he lost his footing and fell. EXAM: CT PELVIS WITHOUT CONTRAST TECHNIQUE: Multidetector CT imaging of the pelvis was performed following the standard protocol without intravenous contrast. COMPARISON:  10/24/2015 pelvis FINDINGS: Three Knowles pins traverse the right femoral neck. Femoral head is well seated in the acetabulum. There is no acute fracture or subluxation. Bones appear osteopenic. There is a bone-on-bone within the right ilium. There is significant degenerative change within the visualized portion of the lower lumbar spine, disc height loss identified at L4-5 and L5-S1. No new fractures are identified. There is atherosclerotic calcification of the abdominal aorta. Moderate sigmoid diverticulosis. Patient has a Foley catheter. There is no free pelvic fluid. Moderate right abdominal wall lipoma incidentally noted. IMPRESSION: 1. Postoperative changes of right hip. 2.  No evidence for acute osseous abnormality. 3. Moderate atherosclerosis of the abdominal aorta. Electronically Signed   By: Lanora Manis  Manson Passey M.D.   On: 10/24/2015 17:46   Dg Esophagus  Result Date: 10/30/2015 CLINICAL DATA:  Loss of appetite EXAM: ESOPHOGRAM/BARIUM SWALLOW TECHNIQUE:  Single contrast examination was performed using thin barium or water soluble. A barium tablet was administered FLUOROSCOPY TIME:  Radiation Exposure Index (as provided by the fluoroscopic device): 17.20 mGy entrance does If the device does not provide the exposure index: Fluoroscopy Time:  1 minutes, 30 seconds COMPARISON:  None in PACs FINDINGS: The patient ingested the thin barium without difficulty. The cervical esophagus distended well with no obvious laryngeal penetration of the barium. The thoracic esophagus distended reasonably well. Esophageal peristalsis appeared normal. The barium tablet hung transiently in the distal third of the esophagus but no stricture was observed. No hiatal hernia or gastroesophageal reflux was observed. IMPRESSION: Normal esophageal function and appearance for age. No evidence of aspiration. No hiatal hernia or reflux was demonstrated. Electronically Signed   By: David  [place] M.D.   On: 10/30/2015 09:03    Assessment/Plan:   1. Acute renal failure, unspecified acute renal failure type (HCC) Condition resolved. Continue to maintain hydration at home. Prescriptions faxed into pharmacy. All questions answered  2. Urinary retention due to benign prostatic hyperplasia D/C home with indwelling catheter and foley bag. Pt was educated yesterday on foley care. Pt verbalized understanding and willingness to continue use of foley. Pt to follow up with urologist in 6 months.   Patient is being discharged with the following home health services:  [facility] PT/OT/RN  Patient is being discharged with the following durable medical equipment:  Foley  Patient has been advised to f/u with their PCP in 1-2 weeks to bring them up to date on their rehab stay.  Social services at facility was responsible for arranging this appointment.  Pt was provided with a 30 day supply of prescriptions for medications and refills must be obtained from their PCP.  For controlled substances, a more limited  supply may be provided adequate until PCP appointment only.  Future labs/tests needed:  None  Family/ staff Communication:  Total Time: 35 minutes  Documentation: 15 minutes  Face to Face: 20 minutes  Family/Phone:  Brynda Rim, NP-C [facility] [address], [place] [address] Cell Phone (Mon-Fri 8am-5pm):  [phone] On Call:  [phone] & follow prompts after 5pm & weekends Office Phone:  [phone] Office Fax:  [phone]

## 2015-11-17 ENCOUNTER — Other Ambulatory Visit: Payer: Self-pay

## 2015-12-11 ENCOUNTER — Ambulatory Visit (INDEPENDENT_AMBULATORY_CARE_PROVIDER_SITE_OTHER): Payer: Medicare PPO | Admitting: Family Medicine

## 2015-12-11 VITALS — BP 90/61 | HR 90 | Ht 66.0 in

## 2015-12-11 DIAGNOSIS — R339 Retention of urine, unspecified: Secondary | ICD-10-CM | POA: Diagnosis not present

## 2015-12-11 NOTE — Progress Notes (Signed)
Cath Change/ Replacement  Patient is present today for a catheter change due to urinary retention.  8ml of water was removed from the balloon, a 16FR foley cath was removed with out difficulty.  Patient was cleaned and prepped in a sterile fashion with betadine. A 16 FR foley cath was replaced into the bladder no complications were noted Urine return was noted 1ml and urine was yellow in color. The balloon was filled with 10ml of sterile water. A night bag was attached for drainage.  A leg bag was also given to the patient and patient was given instruction on how to change from one bag to another. Patient was given proper instruction on catheter care.    Preformed by: Teressa Lowerarrie Noora Locascio, CMA Follow up: 4 weeks for Catheter change

## 2015-12-13 ENCOUNTER — Encounter: Payer: Self-pay | Admitting: Emergency Medicine

## 2015-12-13 ENCOUNTER — Emergency Department
Admission: EM | Admit: 2015-12-13 | Discharge: 2015-12-13 | Disposition: A | Discharge status: Home / Self Care | Payer: Medicare PPO | Attending: Emergency Medicine | Admitting: Emergency Medicine

## 2015-12-13 DIAGNOSIS — I119 Hypertensive heart disease without heart failure: Secondary | ICD-10-CM | POA: Diagnosis not present

## 2015-12-13 DIAGNOSIS — I2510 Atherosclerotic heart disease of native coronary artery without angina pectoris: Secondary | ICD-10-CM | POA: Insufficient documentation

## 2015-12-13 DIAGNOSIS — Z87891 Personal history of nicotine dependence: Secondary | ICD-10-CM | POA: Diagnosis not present

## 2015-12-13 DIAGNOSIS — Z79899 Other long term (current) drug therapy: Secondary | ICD-10-CM | POA: Diagnosis not present

## 2015-12-13 DIAGNOSIS — R339 Retention of urine, unspecified: Secondary | ICD-10-CM | POA: Diagnosis not present

## 2015-12-13 DIAGNOSIS — Z7982 Long term (current) use of aspirin: Secondary | ICD-10-CM | POA: Diagnosis not present

## 2015-12-13 DIAGNOSIS — J449 Chronic obstructive pulmonary disease, unspecified: Secondary | ICD-10-CM | POA: Diagnosis not present

## 2015-12-13 DIAGNOSIS — R300 Dysuria: Secondary | ICD-10-CM | POA: Diagnosis present

## 2015-12-13 LAB — URINALYSIS COMPLETE WITH MICROSCOPIC (ARMC ONLY)
BACTERIA UA: NONE SEEN
Bilirubin Urine: NEGATIVE
Glucose, UA: NEGATIVE mg/dL
KETONES UR: NEGATIVE mg/dL
Nitrite: NEGATIVE
PH: 6 (ref 5.0–8.0)
PROTEIN: NEGATIVE mg/dL
SQUAMOUS EPITHELIAL / LPF: NONE SEEN
Specific Gravity, Urine: 1.008 (ref 1.005–1.030)

## 2015-12-13 MED ORDER — LIDOCAINE HCL 2 % EX GEL
CUTANEOUS | Status: AC
  Filled 2015-12-13: qty 10

## 2015-12-13 NOTE — ED Triage Notes (Signed)
Patient has indwelling foley that is changed monthly.  Cath was changed on Friday and patient began experiencing pain in his penis at approximately midnight.

## 2015-12-13 NOTE — ED Provider Notes (Signed)
[facility] Emergency Department Provider Note  Time seen: 4:19 AM  I have reviewed the triage vital signs and the nursing notes.   HISTORY  Chief Complaint Dysuria    HPI Daniel Dickson is a 82 y.o. male much younger appearing than stated age who presents to the emergency department with urinary retention and lower abdominal pain. According to the patient he has chronic urinary retention for which he has a chronic indwelling Foley catheter. The catheter is exchanged every month. The catheter was last exchanged yesterday, he states around midnight this evening he began feeling a dull lower abdominal pain. And noticed that the catheter has not been draining urine. States the pain is worse and so he came to the emergency department for evaluation. Describes the pain is located in the lower abdomen and penis. Denies any bloody drainage. Denies any fever.  Past Medical History:  Diagnosis Date  . Anxiety   . COPD (chronic obstructive pulmonary disease) (HCC)   . Coronary artery disease    a. 2012 s/p MI/cardiac arrest--> PCI mRCA.  Marland Kitchen Hypertensive heart disease   . Mixed hyperlipidemia   . PTSD (post-traumatic stress disorder)    with headaches    Patient Active Problem List   Diagnosis Date Noted  . Urinary retention 11/10/2015  . Hyperkalemia 11/02/2015  . Hypotension 11/02/2015  . Dysphagia, pharyngoesophageal phase 11/02/2015  . Esophageal spasm 11/02/2015  . Constipation 11/02/2015  . SIADH (syndrome of inappropriate ADH production) (HCC) 11/02/2015  . Urinary retention due to benign prostatic hyperplasia 11/02/2015  . Chronic indwelling Foley catheter 11/02/2015  . Bacteriuria with pyuria 11/02/2015  . Protein-calorie malnutrition, severe 10/30/2015  . ARF (acute renal failure) (HCC) 10/29/2015  . Dyspnea 10/07/2015  . Hyponatremia 06/06/2015  . Coronary artery disease   . Hypertensive heart disease   . Mixed hyperlipidemia   . COPD (chronic  obstructive pulmonary disease) (HCC)   . COPD exacerbation (HCC) 01/11/2015  . Moderate COPD (chronic obstructive pulmonary disease) (HCC) 12/08/2014  . Fall 09/03/2014  . Hip fracture (HCC) 09/03/2014  . PTSD (post-traumatic stress disorder) 09/03/2014  . Bilateral leg edema 09/03/2014  . SOB (shortness of breath) 10/31/2012  . Essential hypertension 10/31/2012  . CAD (coronary artery disease) 10/31/2012  . Hyperlipidemia 10/31/2012  . Tachycardia 10/31/2012    Past Surgical History:  Procedure Laterality Date  . CATARACT EXTRACTION    . CHOLECYSTECTOMY    . CORONARY ANGIOPLASTY     with stent; [facility]  . HAND SURGERY    . HEMORRHOID SURGERY    . HIP SURGERY    . VASCULAR SURGERY     stent R femoral artery    Prior to Admission medications   Medication Sig Start Date End Date Taking? Authorizing Provider  acetaminophen (TYLENOL) 500 MG tablet Take 500 mg by mouth daily as needed for mild pain or moderate pain.     Historical Provider, MD  albuterol (PROAIR HFA) 108 (90 BASE) MCG/ACT inhaler Inhale 2 puffs into the lungs every 6 (six) hours as needed. For wheezing.    Historical Provider, MD  albuterol (PROVENTIL) (2.5 MG/3ML) 0.083% nebulizer solution Take 3 mLs (2.5 mg total) by nebulization every 8 (eight) hours as needed for wheezing or shortness of breath. Patient not taking: Reported on 11/10/2015 10/09/15   Adrian Saran, MD  Alum & Mag Hydroxide-Simeth (GI COCKTAIL) SUSP suspension Take 30 mLs by mouth 3 (three) times daily as needed for indigestion. Shake well. Patient not taking: Reported on  11/10/2015 11/02/15   Katharina Caper, MD  aspirin EC 81 MG tablet Take 81 mg by mouth daily.    Historical Provider, MD  benzonatate (TESSALON) 200 MG capsule Take 200 mg by mouth 3 (three) times daily as needed for cough.    Historical Provider, MD  carisoprodol (SOMA) 350 MG tablet Take 1 tablet (350 mg total) by mouth 3 (three) times daily as needed for muscle spasms. Patient not taking:  Reported on 11/10/2015 10/24/15   Myrna Blazer, MD  Cholecalciferol (VITAMIN D3) 1000 UNITS CAPS Take 1,000 Units by mouth daily.    Historical Provider, MD  feeding supplement (BOOST / RESOURCE BREEZE) LIQD Take 1 Container by mouth 3 (three) times daily between meals. 11/02/15   Katharina Caper, MD  finasteride (PROSCAR) 5 MG tablet Take 1 tablet (5 mg total) by mouth daily. 10/20/15 10/19/16  Nita Sickle, MD  fluticasone furoate-vilanterol (BREO ELLIPTA) 100-25 MCG/INH AEPB Inhale 1 puff into the lungs daily. Reported on 10/19/2015    Historical Provider, MD  isosorbide mononitrate (IMDUR) 30 MG 24 hr tablet Take 1 tablet (30 mg total) by mouth daily. 11/02/15   Katharina Caper, MD  loratadine (CLARITIN) 10 MG tablet Take 10 mg by mouth daily.    Historical Provider, MD  magnesium hydroxide (MILK OF MAGNESIA) 400 MG/5ML suspension Take 30 mLs by mouth 2 (two) times daily as needed for mild constipation or moderate constipation. 11/02/15   Katharina Caper, MD  Melatonin 5 MG CAPS Take 10 mg by mouth at bedtime.    Historical Provider, MD  mupirocin cream (BACTROBAN) 2 % Apply topically daily. 11/02/15   Katharina Caper, MD  nitroGLYCERIN (NITROSTAT) 0.4 MG SL tablet Place 0.4 mg under the tongue every 5 (five) minutes as needed for chest pain. Reported on 10/19/2015    Historical Provider, MD  ondansetron (ZOFRAN) 4 MG tablet Take 1 tablet (4 mg total) by mouth every 6 (six) hours as needed for nausea. 11/02/15   Katharina Caper, MD  pantoprazole (PROTONIX) 40 MG tablet Take 1 tablet (40 mg total) by mouth daily. 11/02/15   Katharina Caper, MD  polyethylene glycol (MIRALAX / GLYCOLAX) packet Take 17 g by mouth daily. Patient not taking: Reported on 11/10/2015 11/02/15   Katharina Caper, MD  pravastatin (PRAVACHOL) 80 MG tablet Take 80 mg by mouth daily.    Historical Provider, MD  tamsulosin (FLOMAX) 0.4 MG CAPS capsule Take 0.4 mg by mouth daily. Take 30 minutes after same meal each day.    Historical  Provider, MD  tiotropium (SPIRIVA) 18 MCG inhalation capsule Place 1 capsule (18 mcg total) into inhaler and inhale every evening. 10/09/15 10/08/16  Adrian Saran, MD    Allergies  Allergen Reactions  . Cortisone Other (See Comments)    Other reaction(s): Other (See Comments) GI Upset Unknown reaction  . Dye Fdc Red [Red Dye] Other (See Comments)    Unknown reaction.  . Iodine (Kelp)  [Iodine]     Other reaction(s): Other (See Comments) GI Upset  . Peanut Oil     Other reaction(s): Unknown  . Peanut-Containing Drug Products     Other reaction(s): Unknown  . Penicillins Rash    GI Upset patient not sure of reaction    Family History  Problem Relation Age of Onset  . COPD Father     Social History Social History  Substance Use Topics  . Smoking status: Former Smoker    Packs/day: 3.00    Years: 40.00    Types:  Cigarettes  . Smokeless tobacco: Never Used  . Alcohol use Yes     Comment: wine at night.     Review of Systems Constitutional: Negative for fever. Cardiovascular: Negative for chest pain. Respiratory: Negative for shortness of breath. Gastrointestinal: Moderate lower abdominal pain. Negative for nausea, vomiting, diarrhea. Genitourinary: Decreased urine output via Foley catheter. Musculoskeletal: Negative for back pain. Neurological: Negative for headache 10-point ROS otherwise negative.  ____________________________________________   PHYSICAL EXAM:  VITAL SIGNS: ED Triage Vitals  Enc Vitals Group     BP 12/13/15 0340 (!) 164/84     Pulse Rate 12/13/15 0340 92     Resp 12/13/15 0340 20     Temp 12/13/15 0340 97.8 F (36.6 C)     Temp Source 12/13/15 0340 Oral     SpO2 12/13/15 0340 100 %     Weight 12/13/15 0341 135 lb (61.2 kg)     Height --      Head Circumference --      Peak Flow --      Pain Score 12/13/15 0341 9     Pain Loc --      Pain Edu? --      Excl. in GC? --     Constitutional: Alert and oriented. Well appearing and in no  distress. Eyes: Normal exam ENT   Head: Normocephalic and atraumatic.   Mouth/Throat: Mucous membranes are moist. Cardiovascular: Normal rate, regular rhythm. No murmur Respiratory: Normal respiratory effort without tachypnea nor retractions. Breath sounds are clear  Gastrointestinal: Soft, moderate lower abdominal tenderness palpation. No rebound or guarding. No upper abdominal tenderness. Musculoskeletal: Nontender with normal range of motion in all extremities.  Neurologic:  Normal speech and language. No gross focal neurologic deficits Skin:  Skin is warm, dry and intact.  Psychiatric: Mood and affect are normal.   ____________________________________________   INITIAL IMPRESSION / ASSESSMENT AND PLAN / ED COURSE  Pertinent labs & imaging results that were available during my care of the patient were reviewed by me and considered in my medical decision making (see chart for details).  Patient presents the emergency department with lower abdominal pain, with a chronic indwelling Foley catheter that does not appear to be draining. Catheter was flushed and repositioned by the nurse, it is draining greater and 500 cc thus far. Patient states significant pain reduction. We will send a urinalysis and continue to monitor the patient to ensure resolution of discomfort.  Patient states pain is significantly reduced. Urinalysis is negative. We will discharge the patient with PCP follow-up. I discussed return precautions for any increased abdominal pain or decreased urine output.  ____________________________________________   FINAL CLINICAL IMPRESSION(S) / ED DIAGNOSES  Lower abdominal pain Urinary retention    Minna AntisKevin Sherie Dobrowolski, MD 12/13/15 [phone]

## 2015-12-15 ENCOUNTER — Encounter: Payer: Self-pay | Admitting: Emergency Medicine

## 2015-12-15 ENCOUNTER — Emergency Department
Admission: EM | Admit: 2015-12-15 | Discharge: 2015-12-15 | Disposition: A | Discharge status: Home / Self Care | Payer: Medicare PPO | Attending: Student in an Organized Health Care Education/Training Program | Admitting: Student in an Organized Health Care Education/Training Program

## 2015-12-15 DIAGNOSIS — J441 Chronic obstructive pulmonary disease with (acute) exacerbation: Secondary | ICD-10-CM | POA: Insufficient documentation

## 2015-12-15 DIAGNOSIS — Z87891 Personal history of nicotine dependence: Secondary | ICD-10-CM | POA: Insufficient documentation

## 2015-12-15 DIAGNOSIS — I119 Hypertensive heart disease without heart failure: Secondary | ICD-10-CM | POA: Insufficient documentation

## 2015-12-15 DIAGNOSIS — Z79899 Other long term (current) drug therapy: Secondary | ICD-10-CM | POA: Insufficient documentation

## 2015-12-15 DIAGNOSIS — R1030 Lower abdominal pain, unspecified: Secondary | ICD-10-CM | POA: Insufficient documentation

## 2015-12-15 DIAGNOSIS — I2510 Atherosclerotic heart disease of native coronary artery without angina pectoris: Secondary | ICD-10-CM | POA: Insufficient documentation

## 2015-12-15 DIAGNOSIS — Z7982 Long term (current) use of aspirin: Secondary | ICD-10-CM | POA: Insufficient documentation

## 2015-12-15 DIAGNOSIS — R3989 Other symptoms and signs involving the genitourinary system: Secondary | ICD-10-CM

## 2015-12-15 DIAGNOSIS — Z791 Long term (current) use of non-steroidal anti-inflammatories (NSAID): Secondary | ICD-10-CM | POA: Insufficient documentation

## 2015-12-15 DIAGNOSIS — R339 Retention of urine, unspecified: Secondary | ICD-10-CM | POA: Diagnosis not present

## 2015-12-15 DIAGNOSIS — N368 Other specified disorders of urethra: Secondary | ICD-10-CM | POA: Insufficient documentation

## 2015-12-15 LAB — URINALYSIS COMPLETE WITH MICROSCOPIC (ARMC ONLY)
Bacteria, UA: NONE SEEN
Bilirubin Urine: NEGATIVE
GLUCOSE, UA: NEGATIVE mg/dL
Hgb urine dipstick: NEGATIVE
KETONES UR: NEGATIVE mg/dL
Nitrite: NEGATIVE
Protein, ur: NEGATIVE mg/dL
SPECIFIC GRAVITY, URINE: 1.009 (ref 1.005–1.030)
Squamous Epithelial / LPF: NONE SEEN
pH: 6 (ref 5.0–8.0)

## 2015-12-15 MED ORDER — NAPROXEN 500 MG PO TABS
500.0000 mg | ORAL_TABLET | Freq: Once | ORAL | Status: AC
  Administered 2015-12-15: 500 mg via ORAL

## 2015-12-15 MED ORDER — FINASTERIDE 5 MG PO TABS
5.0000 mg | ORAL_TABLET | Freq: Every day | ORAL | Status: DC
  Administered 2015-12-15: 5 mg via ORAL
  Filled 2015-12-15 (×2): qty 1

## 2015-12-15 MED ORDER — NAPROXEN 500 MG PO TABS
ORAL_TABLET | ORAL | Status: AC
  Administered 2015-12-15: 500 mg via ORAL
  Filled 2015-12-15: qty 1

## 2015-12-15 MED ORDER — PHENAZOPYRIDINE HCL 200 MG PO TABS
ORAL_TABLET | ORAL | Status: AC
  Filled 2015-12-15: qty 1

## 2015-12-15 MED ORDER — ACETAMINOPHEN 500 MG PO TABS
ORAL_TABLET | ORAL | Status: AC
  Administered 2015-12-15: 1000 mg via ORAL
  Filled 2015-12-15: qty 2

## 2015-12-15 MED ORDER — LIDOCAINE HCL 2 % EX GEL
1.0000 "application " | Freq: Once | CUTANEOUS | Status: AC
  Administered 2015-12-15: 1 via URETHRAL
  Filled 2015-12-15: qty 5

## 2015-12-15 MED ORDER — PHENAZOPYRIDINE HCL 100 MG PO TABS
95.0000 mg | ORAL_TABLET | Freq: Once | ORAL | Status: AC
  Administered 2015-12-15: 100 mg via ORAL
  Filled 2015-12-15 (×2): qty 1

## 2015-12-15 MED ORDER — ACETAMINOPHEN 500 MG PO TABS
1000.0000 mg | ORAL_TABLET | Freq: Once | ORAL | Status: AC
  Administered 2015-12-15: 1000 mg via ORAL

## 2015-12-15 MED ORDER — NAPROXEN 375 MG PO TABS
375.0000 mg | ORAL_TABLET | Freq: Once | ORAL | Status: DC
  Filled 2015-12-15: qty 1

## 2015-12-15 NOTE — ED Provider Notes (Signed)
[facility] Emergency Department Provider Note    First MD Initiated Contact with Patient 12/15/15 1814     (approximate)  I have reviewed the triage vital signs and the nursing notes.   HISTORY  Chief Complaint Urinary Retention    HPI Daniel Dickson is a [age] y.o. male with a chronic indwelling Foley presents with lower abdominal pain and urinary retention for the past 4 hours. Patient had similar presentation on the third of this month. Intermittently will have catheter-related issues.  The patient denies any blood in his urine. No flank pain. No abdominal epigastric discomfort. No flank pain. Denies any shortness of breath. Denies any pain in his testicles. Denies any fevers.     Past Medical History:  Diagnosis Date  . Anxiety   . COPD (chronic obstructive pulmonary disease) (HCC)   . Coronary artery disease    a. 2012 s/p MI/cardiac arrest--> PCI mRCA.  Marland Kitchen. Hypertensive heart disease   . Mixed hyperlipidemia   . PTSD (post-traumatic stress disorder)    with headaches    Patient Active Problem List   Diagnosis Date Noted  . Urinary retention 11/10/2015  . Hyperkalemia 11/02/2015  . Hypotension 11/02/2015  . Dysphagia, pharyngoesophageal phase 11/02/2015  . Esophageal spasm 11/02/2015  . Constipation 11/02/2015  . SIADH (syndrome of inappropriate ADH production) (HCC) 11/02/2015  . Urinary retention due to benign prostatic hyperplasia 11/02/2015  . Chronic indwelling Foley catheter 11/02/2015  . Bacteriuria with pyuria 11/02/2015  . Protein-calorie malnutrition, severe 10/30/2015  . ARF (acute renal failure) (HCC) 10/29/2015  . Dyspnea 10/07/2015  . Hyponatremia 06/06/2015  . Coronary artery disease   . Hypertensive heart disease   . Mixed hyperlipidemia   . COPD (chronic obstructive pulmonary disease) (HCC)   . COPD exacerbation (HCC) 01/11/2015  . Moderate COPD (chronic obstructive pulmonary disease) (HCC) 12/08/2014  . Fall  09/03/2014  . Hip fracture (HCC) 09/03/2014  . PTSD (post-traumatic stress disorder) 09/03/2014  . Bilateral leg edema 09/03/2014  . SOB (shortness of breath) 10/31/2012  . Essential hypertension 10/31/2012  . CAD (coronary artery disease) 10/31/2012  . Hyperlipidemia 10/31/2012  . Tachycardia 10/31/2012    Past Surgical History:  Procedure Laterality Date  . CATARACT EXTRACTION    . CHOLECYSTECTOMY    . CORONARY ANGIOPLASTY     with stent; [facility]  . HAND SURGERY    . HEMORRHOID SURGERY    . HIP SURGERY    . VASCULAR SURGERY     stent R femoral artery    Prior to Admission medications   Medication Sig Start Date End Date Taking? Authorizing Provider  acetaminophen (TYLENOL) 500 MG tablet Take 500 mg by mouth daily as needed for mild pain or moderate pain.     Historical Provider, MD  albuterol (PROAIR HFA) 108 (90 BASE) MCG/ACT inhaler Inhale 2 puffs into the lungs every 6 (six) hours as needed. For wheezing.    Historical Provider, MD  albuterol (PROVENTIL) (2.5 MG/3ML) 0.083% nebulizer solution Take 3 mLs (2.5 mg total) by nebulization every 8 (eight) hours as needed for wheezing or shortness of breath. Patient not taking: Reported on 11/10/2015 10/09/15   Adrian SaranSital Mody, MD  Alum & Mag Hydroxide-Simeth (GI COCKTAIL) SUSP suspension Take 30 mLs by mouth 3 (three) times daily as needed for indigestion. Shake well. Patient not taking: Reported on 11/10/2015 11/02/15   Katharina Caperima Vaickute, MD  aspirin EC 81 MG tablet Take 81 mg by mouth daily.    Historical Provider,  MD  benzonatate (TESSALON) 200 MG capsule Take 200 mg by mouth 3 (three) times daily as needed for cough.    Historical Provider, MD  carisoprodol (SOMA) 350 MG tablet Take 1 tablet (350 mg total) by mouth 3 (three) times daily as needed for muscle spasms. Patient not taking: Reported on 11/10/2015 10/24/15   Myrna Blazer, MD  Cholecalciferol (VITAMIN D3) 1000 UNITS CAPS Take 1,000 Units by mouth daily.    Historical Provider,  MD  feeding supplement (BOOST / RESOURCE BREEZE) LIQD Take 1 Container by mouth 3 (three) times daily between meals. 11/02/15   Katharina Caper, MD  finasteride (PROSCAR) 5 MG tablet Take 1 tablet (5 mg total) by mouth daily. 10/20/15 10/19/16  Nita Sickle, MD  fluticasone furoate-vilanterol (BREO ELLIPTA) 100-25 MCG/INH AEPB Inhale 1 puff into the lungs daily. Reported on 10/19/2015    Historical Provider, MD  isosorbide mononitrate (IMDUR) 30 MG 24 hr tablet Take 1 tablet (30 mg total) by mouth daily. 11/02/15   Katharina Caper, MD  loratadine (CLARITIN) 10 MG tablet Take 10 mg by mouth daily.    Historical Provider, MD  magnesium hydroxide (MILK OF MAGNESIA) 400 MG/5ML suspension Take 30 mLs by mouth 2 (two) times daily as needed for mild constipation or moderate constipation. 11/02/15   Katharina Caper, MD  Melatonin 5 MG CAPS Take 10 mg by mouth at bedtime.    Historical Provider, MD  mupirocin cream (BACTROBAN) 2 % Apply topically daily. 11/02/15   Katharina Caper, MD  nitroGLYCERIN (NITROSTAT) 0.4 MG SL tablet Place 0.4 mg under the tongue every 5 (five) minutes as needed for chest pain. Reported on 10/19/2015    Historical Provider, MD  ondansetron (ZOFRAN) 4 MG tablet Take 1 tablet (4 mg total) by mouth every 6 (six) hours as needed for nausea. 11/02/15   Katharina Caper, MD  pantoprazole (PROTONIX) 40 MG tablet Take 1 tablet (40 mg total) by mouth daily. 11/02/15   Katharina Caper, MD  polyethylene glycol (MIRALAX / GLYCOLAX) packet Take 17 g by mouth daily. Patient not taking: Reported on 11/10/2015 11/02/15   Katharina Caper, MD  pravastatin (PRAVACHOL) 80 MG tablet Take 80 mg by mouth daily.    Historical Provider, MD  tamsulosin (FLOMAX) 0.4 MG CAPS capsule Take 0.4 mg by mouth daily. Take 30 minutes after same meal each day.    Historical Provider, MD  tiotropium (SPIRIVA) 18 MCG inhalation capsule Place 1 capsule (18 mcg total) into inhaler and inhale every evening. 10/09/15 10/08/16  Adrian Saran, MD     Allergies Cortisone; Dye fdc red [red dye]; Iodine (kelp)  [iodine]; Peanut oil; Peanut-containing drug products; and Penicillins  Family History  Problem Relation Age of Onset  . COPD Father     Social History Social History  Substance Use Topics  . Smoking status: Former Smoker    Packs/day: 3.00    Years: 40.00    Types: Cigarettes  . Smokeless tobacco: Never Used  . Alcohol use Yes     Comment: wine at night.     Review of Systems Patient denies headaches, rhinorrhea, blurry vision, numbness, shortness of breath, chest pain, edema, cough, abdominal pain, nausea, vomiting, diarrhea, dysuria, fevers, rashes or hallucinations unless otherwise stated above in HPI. ____________________________________________   PHYSICAL EXAM:  VITAL SIGNS: Vitals:   12/15/15 1702  BP: 122/74  Pulse: 92  Resp: 16  Temp: 97.6 F (36.4 C)    Constitutional: Alert and oriented. Appears younger than stated age Eyes: Conjunctivae  are normal. PERRL. EOMI. Head: Atraumatic. Nose: No congestion/rhinnorhea. Mouth/Throat: Mucous membranes are moist.  Oropharynx non-erythematous. Neck: No stridor. Painless ROM. No cervical spine tenderness to palpation Hematological/Lymphatic/Immunilogical: No cervical lymphadenopathy. Cardiovascular: Normal rate, regular rhythm. Grossly normal heart sounds.  Good peripheral circulation. Respiratory: Normal respiratory effort.  No retractions. Lungs CTAB. Gastrointestinal: Suprapubic fullness and tenderness to palpation without guarding or rebound. Normal-appearing penis as well as scrotum. Foley catheter is in place with very limited urine in the Foley bag. There is no hematuria Genitourinary:  Musculoskeletal: No lower extremity tenderness nor edema.  No joint effusions. Neurologic:  Normal speech and language. No gross focal neurologic deficits are appreciated. No gait instability. Skin:  Skin is warm, dry and intact. No rash noted. Psychiatric: Mood and  affect are normal. Speech and behavior are normal.  ____________________________________________   LABS (all labs ordered are listed, but only abnormal results are displayed)  No results found for this or any previous visit (from the past 24 hour(s)). ____________________________________________  EKG____________________________________________  RADIOLOGY  none ____________________________________________   PROCEDURES  Procedure(s) performed: none    Critical Care performed: no ____________________________________________   INITIAL IMPRESSION / ASSESSMENT AND PLAN / ED COURSE  Pertinent labs & imaging results that were available during my care of the patient were reviewed by me and considered in my medical decision making (see chart for details).  DDX: Acute urinary retention, hematuria, catheter problem  Daniel Dickson is a 82 y.o. who presents to the ED with chronic indwelling Foley presenting with urinary retention and lower abdominal pain. Patient afebrile and hemodynamically stable. Does have mild urinary retention 300 cc on bladder scan without any significant drainage from the Foley. Will replace Foley..   Clinical Course  Comment By Time  Catheter exchanged with adequate drainage of urine. Willy Eddy, MD 09/05 1915  Symptoms markedly improved. Patient requesting discharge home. Willy Eddy, MD 09/05 [phone]  Patient had interval worsening in pain that appeared to be localized to the distal urethra. Patient provided with topical lidocaine with improvement in symptoms. To the verify appropriate letter placement bedside ultrasound does show Foley bladder appropriately decompressing the urinary bladder. I was able to inject 60 cc of sterile saline into the bladder and actively withdrawal of fluid. I do feel that the Foley catheter is in appropriate position. I do suspect the patient's discomfort is secondary to urethral spasm and irritation.  UA without  infection.  Stable for outpatient follow up.  Have discussed with the patient and available family all diagnostics and treatments performed thus far and all questions were answered to the best of my ability. The patient demonstrates understanding and agreement with plan.  Willy Eddy, MD 09/05 2047     ____________________________________________   FINAL CLINICAL IMPRESSION(S) / ED DIAGNOSES  Final diagnoses:  Urinary retention  Urethral pain      NEW MEDICATIONS STARTED DURING THIS VISIT:  New Prescriptions   No medications on file     Note:  This document was prepared using Dragon voice recognition software and may include unintentional dictation errors.    Willy Eddy, MD 12/15/15 2125

## 2015-12-15 NOTE — ED Notes (Signed)
This RN educated pt about catheter care and usage of leg bag, pt verbalized understanding

## 2015-12-15 NOTE — ED Triage Notes (Signed)
Pt to ED c/o urinary retention.  States was here on Sunday for same, has had foley catheter x3 months approx.  States had catheter changed this past Friday routinely.  Pt has approx. 50cc output since 10am this morning.  States painful and full feeling.

## 2015-12-15 NOTE — ED Notes (Signed)
Bladder scan .

## 2015-12-15 NOTE — Discharge Instructions (Signed)
Please follow-up with urology.  Return for worsening pain or discomfort.

## 2015-12-15 NOTE — ED Notes (Signed)
Pts catheter changed without issue, urine draining

## 2015-12-16 ENCOUNTER — Telehealth: Payer: Self-pay

## 2015-12-16 NOTE — Telephone Encounter (Signed)
Pt daughter called stating pt had catheter changed in our office on Friday, Sunday pt went to ER due to cath not draining, and then yesterday pt returned to the ER due to cath not draining again. Per daughter pt cath was changed in the ER yesterday. Per daughter since returning home from ER yesterday pt has develop blood in his foley and was concerned. Reinforced with daughter for pt to drink plenty of fluids as blood in the urine is normal esp after the manipulation pt has currently had to cath. Daughter voiced understanding of whole conversation.

## 2016-01-11 ENCOUNTER — Ambulatory Visit (INDEPENDENT_AMBULATORY_CARE_PROVIDER_SITE_OTHER): Payer: Medicare PPO

## 2016-01-11 VITALS — BP 126/74 | HR 99 | Ht 66.0 in | Wt 131.0 lb

## 2016-01-11 DIAGNOSIS — R339 Retention of urine, unspecified: Secondary | ICD-10-CM

## 2016-01-11 NOTE — Progress Notes (Signed)
Cath Change/ Replacement  Patient is present today for a catheter change due to urinary retention.  15ml of water was removed from the balloon, a 16FR foley cath was removed with out difficulty.  Patient was cleaned and prepped in a sterile fashion with betadine and 2% lidocaine jelly was instilled into the urethra. A 16 FR foley cath was replaced into the bladder no complications were noted Urine return was noted 50ml and urine was light yellow in color. The balloon was filled with 10ml of sterile water. A night bag was attached for drainage.  A night bag was also given to the patient and patient was given instruction on how to change from one bag to another. Patient was given proper instruction on catheter care.    Preformed by: Leeroy Bockhelsea Nesreen Albano, LPN  Follow up: 53mo.  Blood pressure 126/74, pulse 99, height 5\' 6"  (1.676 m), weight 131 lb (59.4 kg).

## 2016-02-11 ENCOUNTER — Ambulatory Visit (INDEPENDENT_AMBULATORY_CARE_PROVIDER_SITE_OTHER): Payer: Medicare PPO

## 2016-02-11 VITALS — BP 118/74 | HR 111 | Ht 66.0 in | Wt 126.0 lb

## 2016-02-11 DIAGNOSIS — R339 Retention of urine, unspecified: Secondary | ICD-10-CM | POA: Diagnosis not present

## 2016-02-11 NOTE — Progress Notes (Signed)
Cath Change/ Replacement  Patient is present today for a catheter change due to urinary retention.  10ml of water was removed from the balloon, a 16FR foley cath was removed with out difficulty.  Patient was cleaned and prepped in a sterile fashion with betadine and 2% lidocaine jelly was instilled into the urethra. A 16 FR foley cath was replaced into the bladder no complications were noted Urine return was noted 50ml and urine was yellow in color. The balloon was filled with 10ml of sterile water. A leg bag was attached for drainage.  A night bag was also given to the patient and patient was given instruction on how to change from one bag to another. Patient was given proper instruction on catheter care.    Preformed by: Rupert Stackshelsea Laiba Fuerte, LPN   Follow up: pt will f/u next month with a provider.   Blood pressure 118/74, pulse (!) 111, height 5\' 6"  (1.676 m), weight 126 lb (57.2 kg).

## 2016-02-16 ENCOUNTER — Ambulatory Visit: Payer: Medicare PPO | Admitting: Cardiovascular Disease

## 2016-02-18 ENCOUNTER — Emergency Department: Payer: Medicare PPO

## 2016-02-18 ENCOUNTER — Inpatient Hospital Stay
Admit: 2016-02-18 | Discharge: 2016-02-18 | Disposition: A | Discharge status: Home / Self Care | Payer: Medicare PPO | Attending: Internal Medicine | Admitting: Internal Medicine

## 2016-02-18 ENCOUNTER — Encounter: Payer: Self-pay | Admitting: Emergency Medicine

## 2016-02-18 ENCOUNTER — Inpatient Hospital Stay
Admission: EM | Admit: 2016-02-18 | Discharge: 2016-02-22 | DRG: 193 | Disposition: A | Discharge status: Skilled Nursing Facility | Payer: Medicare PPO | Attending: Internal Medicine | Admitting: Internal Medicine

## 2016-02-18 DIAGNOSIS — R600 Localized edema: Secondary | ICD-10-CM | POA: Diagnosis present

## 2016-02-18 DIAGNOSIS — I248 Other forms of acute ischemic heart disease: Secondary | ICD-10-CM | POA: Diagnosis present

## 2016-02-18 DIAGNOSIS — R7881 Bacteremia: Secondary | ICD-10-CM | POA: Diagnosis present

## 2016-02-18 DIAGNOSIS — I2510 Atherosclerotic heart disease of native coronary artery without angina pectoris: Secondary | ICD-10-CM | POA: Diagnosis present

## 2016-02-18 DIAGNOSIS — R7989 Other specified abnormal findings of blood chemistry: Secondary | ICD-10-CM

## 2016-02-18 DIAGNOSIS — R1310 Dysphagia, unspecified: Secondary | ICD-10-CM | POA: Diagnosis present

## 2016-02-18 DIAGNOSIS — Z9181 History of falling: Secondary | ICD-10-CM

## 2016-02-18 DIAGNOSIS — Z91010 Allergy to peanuts: Secondary | ICD-10-CM

## 2016-02-18 DIAGNOSIS — E782 Mixed hyperlipidemia: Secondary | ICD-10-CM | POA: Diagnosis present

## 2016-02-18 DIAGNOSIS — J9601 Acute respiratory failure with hypoxia: Secondary | ICD-10-CM | POA: Diagnosis present

## 2016-02-18 DIAGNOSIS — R2681 Unsteadiness on feet: Secondary | ICD-10-CM

## 2016-02-18 DIAGNOSIS — Z87891 Personal history of nicotine dependence: Secondary | ICD-10-CM | POA: Diagnosis not present

## 2016-02-18 DIAGNOSIS — Z79899 Other long term (current) drug therapy: Secondary | ICD-10-CM

## 2016-02-18 DIAGNOSIS — J440 Chronic obstructive pulmonary disease with acute lower respiratory infection: Secondary | ICD-10-CM | POA: Diagnosis present

## 2016-02-18 DIAGNOSIS — B952 Enterococcus as the cause of diseases classified elsewhere: Secondary | ICD-10-CM | POA: Diagnosis present

## 2016-02-18 DIAGNOSIS — M6281 Muscle weakness (generalized): Secondary | ICD-10-CM

## 2016-02-18 DIAGNOSIS — I252 Old myocardial infarction: Secondary | ICD-10-CM | POA: Diagnosis not present

## 2016-02-18 DIAGNOSIS — J189 Pneumonia, unspecified organism: Secondary | ICD-10-CM | POA: Diagnosis not present

## 2016-02-18 DIAGNOSIS — R0600 Dyspnea, unspecified: Secondary | ICD-10-CM

## 2016-02-18 DIAGNOSIS — R634 Abnormal weight loss: Secondary | ICD-10-CM | POA: Diagnosis present

## 2016-02-18 DIAGNOSIS — Z8249 Family history of ischemic heart disease and other diseases of the circulatory system: Secondary | ICD-10-CM | POA: Diagnosis not present

## 2016-02-18 DIAGNOSIS — R778 Other specified abnormalities of plasma proteins: Secondary | ICD-10-CM

## 2016-02-18 DIAGNOSIS — Z955 Presence of coronary angioplasty implant and graft: Secondary | ICD-10-CM | POA: Diagnosis not present

## 2016-02-18 DIAGNOSIS — R0902 Hypoxemia: Secondary | ICD-10-CM

## 2016-02-18 DIAGNOSIS — N400 Enlarged prostate without lower urinary tract symptoms: Secondary | ICD-10-CM | POA: Diagnosis present

## 2016-02-18 DIAGNOSIS — J9600 Acute respiratory failure, unspecified whether with hypoxia or hypercapnia: Secondary | ICD-10-CM | POA: Diagnosis present

## 2016-02-18 DIAGNOSIS — Z7982 Long term (current) use of aspirin: Secondary | ICD-10-CM | POA: Diagnosis not present

## 2016-02-18 DIAGNOSIS — B377 Candidal sepsis: Secondary | ICD-10-CM | POA: Diagnosis present

## 2016-02-18 DIAGNOSIS — Z880 Allergy status to penicillin: Secondary | ICD-10-CM | POA: Diagnosis not present

## 2016-02-18 DIAGNOSIS — Z888 Allergy status to other drugs, medicaments and biological substances status: Secondary | ICD-10-CM | POA: Diagnosis not present

## 2016-02-18 DIAGNOSIS — R0602 Shortness of breath: Secondary | ICD-10-CM | POA: Diagnosis not present

## 2016-02-18 LAB — CBC WITH DIFFERENTIAL/PLATELET
BASOS ABS: 0.1 10*3/uL (ref 0–0.1)
BASOS PCT: 1 %
EOS ABS: 0.3 10*3/uL (ref 0–0.7)
EOS PCT: 3 %
HEMATOCRIT: 43.3 % (ref 40.0–52.0)
Hemoglobin: 14.7 g/dL (ref 13.0–18.0)
Lymphocytes Relative: 11 %
Lymphs Abs: 1.4 10*3/uL (ref 1.0–3.6)
MCH: 31.8 pg (ref 26.0–34.0)
MCHC: 33.9 g/dL (ref 32.0–36.0)
MCV: 93.7 fL (ref 80.0–100.0)
MONO ABS: 0.5 10*3/uL (ref 0.2–1.0)
MONOS PCT: 4 %
NEUTROS ABS: 10.3 10*3/uL — AB (ref 1.4–6.5)
Neutrophils Relative %: 81 %
PLATELETS: 396 10*3/uL (ref 150–440)
RBC: 4.63 MIL/uL (ref 4.40–5.90)
RDW: 15.6 % — AB (ref 11.5–14.5)
WBC: 12.7 10*3/uL — ABNORMAL HIGH (ref 3.8–10.6)

## 2016-02-18 LAB — COMPREHENSIVE METABOLIC PANEL
ALBUMIN: 3.1 g/dL — AB (ref 3.5–5.0)
ALT: 36 U/L (ref 17–63)
AST: 50 U/L — AB (ref 15–41)
Alkaline Phosphatase: 96 U/L (ref 38–126)
Anion gap: 9 (ref 5–15)
BUN: 22 mg/dL — AB (ref 6–20)
CHLORIDE: 96 mmol/L — AB (ref 101–111)
CO2: 26 mmol/L (ref 22–32)
Calcium: 10.1 mg/dL (ref 8.9–10.3)
Creatinine, Ser: 1.12 mg/dL (ref 0.61–1.24)
GFR calc Af Amer: 60 mL/min — ABNORMAL LOW (ref >60)
GFR calc non Af Amer: 51 mL/min — ABNORMAL LOW (ref >60)
GLUCOSE: 113 mg/dL — AB (ref 65–99)
POTASSIUM: 4.3 mmol/L (ref 3.5–5.1)
Sodium: 131 mmol/L — ABNORMAL LOW (ref 135–145)
Total Bilirubin: 1 mg/dL (ref 0.3–1.2)
Total Protein: 6.3 g/dL — ABNORMAL LOW (ref 6.5–8.1)

## 2016-02-18 LAB — BLOOD CULTURE ID PANEL (REFLEXED)
Acinetobacter baumannii: NOT DETECTED
CANDIDA ALBICANS: DETECTED — AB
CANDIDA GLABRATA: NOT DETECTED
CANDIDA KRUSEI: NOT DETECTED
Candida parapsilosis: NOT DETECTED
Candida tropicalis: NOT DETECTED
ENTEROBACTER CLOACAE COMPLEX: NOT DETECTED
ENTEROBACTERIACEAE SPECIES: NOT DETECTED
ENTEROCOCCUS SPECIES: DETECTED — AB
ESCHERICHIA COLI: NOT DETECTED
Haemophilus influenzae: NOT DETECTED
KLEBSIELLA PNEUMONIAE: NOT DETECTED
Klebsiella oxytoca: NOT DETECTED
Listeria monocytogenes: NOT DETECTED
NEISSERIA MENINGITIDIS: NOT DETECTED
Proteus species: NOT DETECTED
Pseudomonas aeruginosa: NOT DETECTED
STREPTOCOCCUS AGALACTIAE: NOT DETECTED
STREPTOCOCCUS PNEUMONIAE: NOT DETECTED
STREPTOCOCCUS PYOGENES: NOT DETECTED
STREPTOCOCCUS SPECIES: NOT DETECTED
Serratia marcescens: NOT DETECTED
Staphylococcus aureus (BCID): NOT DETECTED
Staphylococcus species: NOT DETECTED
VANCOMYCIN RESISTANCE: NOT DETECTED

## 2016-02-18 LAB — URINALYSIS COMPLETE WITH MICROSCOPIC (ARMC ONLY)
BACTERIA UA: NONE SEEN
Bilirubin Urine: NEGATIVE
Glucose, UA: NEGATIVE mg/dL
HGB URINE DIPSTICK: NEGATIVE
Ketones, ur: NEGATIVE mg/dL
LEUKOCYTES UA: NEGATIVE
Nitrite: NEGATIVE
PROTEIN: 30 mg/dL — AB
SPECIFIC GRAVITY, URINE: 1.013 (ref 1.005–1.030)
SQUAMOUS EPITHELIAL / LPF: NONE SEEN
pH: 6 (ref 5.0–8.0)

## 2016-02-18 LAB — TROPONIN I
TROPONIN I: 0.89 ng/mL — AB (ref <0.03)
TROPONIN I: 2.06 ng/mL — AB (ref <0.03)
Troponin I: 2.74 ng/mL (ref <0.03)
Troponin I: 2.63 ng/mL (ref <0.03)

## 2016-02-18 LAB — LACTIC ACID, PLASMA: LACTIC ACID, VENOUS: 1.9 mmol/L (ref 0.5–1.9)

## 2016-02-18 LAB — BRAIN NATRIURETIC PEPTIDE: B NATRIURETIC PEPTIDE 5: 93 pg/mL (ref 0.0–100.0)

## 2016-02-18 MED ORDER — PRAVASTATIN SODIUM 20 MG PO TABS
80.0000 mg | ORAL_TABLET | Freq: Every day | ORAL | Status: DC
  Administered 2016-02-18 – 2016-02-22 (×5): 80 mg via ORAL
  Filled 2016-02-18 (×5): qty 4

## 2016-02-18 MED ORDER — ONDANSETRON HCL 4 MG/2ML IJ SOLN: 4.0000 mg | Freq: Four times a day (QID) | INTRAMUSCULAR | Status: DC | PRN

## 2016-02-18 MED ORDER — BENZONATATE 100 MG PO CAPS: 200.0000 mg | ORAL_CAPSULE | Freq: Three times a day (TID) | ORAL | Status: DC | PRN

## 2016-02-18 MED ORDER — MAGNESIUM HYDROXIDE 400 MG/5ML PO SUSP: 30.0000 mL | Freq: Two times a day (BID) | ORAL | Status: DC | PRN

## 2016-02-18 MED ORDER — ENOXAPARIN SODIUM 30 MG/0.3ML ~~LOC~~ SOLN
30.0000 mg | SUBCUTANEOUS | Status: DC
  Administered 2016-02-18: 11:00:00 30 mg via SUBCUTANEOUS
  Filled 2016-02-18: qty 0.3

## 2016-02-18 MED ORDER — ENOXAPARIN SODIUM 60 MG/0.6ML ~~LOC~~ SOLN
1.0000 mg/kg | SUBCUTANEOUS | Status: DC
  Administered 2016-02-18 – 2016-02-20 (×2): 60 mg via SUBCUTANEOUS
  Filled 2016-02-18 (×3): qty 0.6

## 2016-02-18 MED ORDER — LEVOFLOXACIN IN D5W 500 MG/100ML IV SOLN
500.0000 mg | INTRAVENOUS | Status: DC
  Filled 2016-02-18: qty 100

## 2016-02-18 MED ORDER — ALBUTEROL SULFATE HFA 108 (90 BASE) MCG/ACT IN AERS: 2.0000 | INHALATION_SPRAY | Freq: Four times a day (QID) | RESPIRATORY_TRACT | Status: DC | PRN

## 2016-02-18 MED ORDER — ALBUTEROL SULFATE (2.5 MG/3ML) 0.083% IN NEBU
2.5000 mg | INHALATION_SOLUTION | Freq: Three times a day (TID) | RESPIRATORY_TRACT | Status: DC | PRN
  Filled 2016-02-18 (×2): qty 3

## 2016-02-18 MED ORDER — ACETAMINOPHEN 325 MG PO TABS
650.0000 mg | ORAL_TABLET | Freq: Four times a day (QID) | ORAL | Status: DC | PRN
  Administered 2016-02-19: 12:00:00 650 mg via ORAL
  Filled 2016-02-18: qty 2

## 2016-02-18 MED ORDER — FINASTERIDE 5 MG PO TABS
5.0000 mg | ORAL_TABLET | Freq: Every day | ORAL | Status: DC
  Administered 2016-02-18 – 2016-02-22 (×5): 5 mg via ORAL
  Filled 2016-02-18 (×5): qty 1

## 2016-02-18 MED ORDER — LORATADINE 10 MG PO TABS
10.0000 mg | ORAL_TABLET | Freq: Every day | ORAL | Status: DC
  Administered 2016-02-18 – 2016-02-22 (×5): 10 mg via ORAL
  Filled 2016-02-18 (×5): qty 1

## 2016-02-18 MED ORDER — TRAMADOL HCL 50 MG PO TABS
50.0000 mg | ORAL_TABLET | Freq: Three times a day (TID) | ORAL | Status: DC | PRN
  Administered 2016-02-18 – 2016-02-21 (×4): 50 mg via ORAL
  Filled 2016-02-18 (×5): qty 1

## 2016-02-18 MED ORDER — ACETAMINOPHEN 650 MG RE SUPP: 650.0000 mg | Freq: Four times a day (QID) | RECTAL | Status: DC | PRN

## 2016-02-18 MED ORDER — TIOTROPIUM BROMIDE MONOHYDRATE 18 MCG IN CAPS
18.0000 ug | ORAL_CAPSULE | Freq: Every evening | RESPIRATORY_TRACT | Status: DC
  Administered 2016-02-18 – 2016-02-21 (×4): 18 ug via RESPIRATORY_TRACT
  Filled 2016-02-18: qty 5

## 2016-02-18 MED ORDER — VITAMIN D 1000 UNITS PO TABS
1000.0000 [IU] | ORAL_TABLET | Freq: Every day | ORAL | Status: DC
  Administered 2016-02-18 – 2016-02-22 (×5): 1000 [IU] via ORAL
  Filled 2016-02-18 (×5): qty 1

## 2016-02-18 MED ORDER — METOPROLOL TARTRATE 25 MG PO TABS
12.5000 mg | ORAL_TABLET | Freq: Two times a day (BID) | ORAL | Status: DC
  Administered 2016-02-18 – 2016-02-22 (×3): 12.5 mg via ORAL
  Filled 2016-02-18 (×9): qty 1

## 2016-02-18 MED ORDER — BOOST / RESOURCE BREEZE PO LIQD
1.0000 | Freq: Three times a day (TID) | ORAL | Status: DC
  Administered 2016-02-18: 1 via ORAL

## 2016-02-18 MED ORDER — LEVOFLOXACIN IN D5W 250 MG/50ML IV SOLN
250.0000 mg | INTRAVENOUS | Status: DC
  Administered 2016-02-19: 10:00:00 250 mg via INTRAVENOUS
  Filled 2016-02-18: qty 50

## 2016-02-18 MED ORDER — ALBUTEROL SULFATE (2.5 MG/3ML) 0.083% IN NEBU
2.5000 mg | INHALATION_SOLUTION | Freq: Four times a day (QID) | RESPIRATORY_TRACT | Status: DC
  Administered 2016-02-18 – 2016-02-21 (×11): 2.5 mg via RESPIRATORY_TRACT
  Filled 2016-02-18 (×12): qty 3

## 2016-02-18 MED ORDER — MELATONIN 5 MG PO CAPS: 10.0000 mg | ORAL_CAPSULE | Freq: Every day | ORAL | Status: DC

## 2016-02-18 MED ORDER — ACETAMINOPHEN 500 MG PO TABS: 500.0000 mg | ORAL_TABLET | Freq: Every day | ORAL | Status: DC | PRN

## 2016-02-18 MED ORDER — ENSURE ENLIVE PO LIQD
237.0000 mL | Freq: Three times a day (TID) | ORAL | Status: DC
  Administered 2016-02-18 – 2016-02-21 (×10): 237 mL via ORAL

## 2016-02-18 MED ORDER — VITAMIN D3 25 MCG (1000 UT) PO CAPS: 1000.0000 [IU] | ORAL_CAPSULE | Freq: Every day | ORAL | Status: DC

## 2016-02-18 MED ORDER — SENNOSIDES-DOCUSATE SODIUM 8.6-50 MG PO TABS: 1.0000 | ORAL_TABLET | Freq: Every evening | ORAL | Status: DC | PRN

## 2016-02-18 MED ORDER — ENOXAPARIN SODIUM 40 MG/0.4ML ~~LOC~~ SOLN: 40.0000 mg | SUBCUTANEOUS | Status: DC

## 2016-02-18 MED ORDER — ONDANSETRON HCL 4 MG PO TABS: 4.0000 mg | ORAL_TABLET | Freq: Four times a day (QID) | ORAL | Status: DC | PRN

## 2016-02-18 MED ORDER — ASPIRIN 81 MG PO CHEW
324.0000 mg | CHEWABLE_TABLET | Freq: Once | ORAL | Status: AC
  Administered 2016-02-18: 324 mg via ORAL
  Filled 2016-02-18: qty 4

## 2016-02-18 MED ORDER — PANTOPRAZOLE SODIUM 40 MG PO TBEC
40.0000 mg | DELAYED_RELEASE_TABLET | Freq: Every day | ORAL | Status: DC
  Administered 2016-02-18 – 2016-02-22 (×5): 40 mg via ORAL
  Filled 2016-02-18 (×5): qty 1

## 2016-02-18 MED ORDER — FLUTICASONE FUROATE-VILANTEROL 100-25 MCG/INH IN AEPB
1.0000 | INHALATION_SPRAY | Freq: Every day | RESPIRATORY_TRACT | Status: DC
  Administered 2016-02-18 – 2016-02-22 (×5): 1 via RESPIRATORY_TRACT
  Filled 2016-02-18: qty 28

## 2016-02-18 MED ORDER — TAMSULOSIN HCL 0.4 MG PO CAPS
0.4000 mg | ORAL_CAPSULE | Freq: Every day | ORAL | Status: DC
  Administered 2016-02-18 – 2016-02-22 (×5): 0.4 mg via ORAL
  Filled 2016-02-18 (×5): qty 1

## 2016-02-18 MED ORDER — NITROGLYCERIN 0.4 MG SL SUBL: 0.4000 mg | SUBLINGUAL_TABLET | SUBLINGUAL | Status: DC | PRN

## 2016-02-18 MED ORDER — MELATONIN 10 MG PO TABS
10.0000 mg | ORAL_TABLET | Freq: Every day | ORAL | Status: DC
  Administered 2016-02-18: 22:00:00 10 mg via ORAL
  Filled 2016-02-18 (×2): qty 1

## 2016-02-18 MED ORDER — ASPIRIN EC 81 MG PO TBEC
81.0000 mg | DELAYED_RELEASE_TABLET | Freq: Every day | ORAL | Status: DC
  Administered 2016-02-18 – 2016-02-22 (×5): 81 mg via ORAL
  Filled 2016-02-18 (×6): qty 1

## 2016-02-18 MED ORDER — ISOSORBIDE MONONITRATE ER 30 MG PO TB24
30.0000 mg | ORAL_TABLET | Freq: Every day | ORAL | Status: DC
  Administered 2016-02-18 – 2016-02-22 (×4): 30 mg via ORAL
  Filled 2016-02-18 (×5): qty 1

## 2016-02-18 MED ORDER — LEVOFLOXACIN IN D5W 500 MG/100ML IV SOLN
500.0000 mg | Freq: Once | INTRAVENOUS | Status: AC
  Administered 2016-02-18: 500 mg via INTRAVENOUS
  Filled 2016-02-18: qty 100

## 2016-02-18 NOTE — Progress Notes (Signed)
Initial Nutrition Assessment  DOCUMENTATION CODES:   Not applicable  INTERVENTION:  1. SLP eval recommended - he states he has been unable to swallow for the past 5 weeks 2. Ensure Enlive po TID w/ meals, each supplement provides 350 kcal and 20 grams of protein  NUTRITION DIAGNOSIS:   Inadequate oral intake related to dysphagia, poor appetite as evidenced by per patient/family report.  GOAL:   Patient will meet greater than or equal to 90% of their needs  MONITOR:   PO intake, I & O's, Labs, Weight trends, Supplement acceptance  REASON FOR ASSESSMENT:   Malnutrition Screening Tool    ASSESSMENT:   [name] is a [age] yo male Admitted to [facility] with the diagnosis of acute respiratory failure  Spoke with him at bedside. [facility], [place] [employer]. He admits to poor appetite, inability to swallow, 25# wt loss over the past 4-5 weeks. Could not find any documentation of weight any sooner than 05/2015 - he was 148# Exhibits an 18#/12.2% insignificant wt loss over 9 months. He also complains of cough that makes him "very weak." States he normally does not eat breakfast, currently on SOFT diet. Performed NFPA - he appears well-nourished for age given expected sarcopenia. Some mild-moderate wasting in his upper body/extremities, lower extremities are good. Labs and medications reviewed: Na 131 Vitamin D  Diet Order:  DIET SOFT Room service appropriate? Yes; Fluid consistency: Thin  Skin:  Reviewed, no issues  Last BM:  PTA  Height:   Ht Readings from Last 1 Encounters:  02/18/16 5\' 6"  (1.676 m)    Weight:   Wt Readings from Last 1 Encounters:  02/18/16 130 lb (59 kg)    Ideal Body Weight:  64.54 kg  BMI:  Body mass index is 20.98 kg/m.  Estimated Nutritional Needs:   Kcal:  1475-1770 calories  Protein:  70-83 gm  Fluid:  >/= 1.5L  EDUCATION NEEDS:   No education needs identified at this  time  Dionne AnoWilliam M. Graciela Plato, MS, RD LDN Inpatient Clinical Dietitian Pager [phone]

## 2016-02-18 NOTE — Progress Notes (Signed)
Anticoagulation Protocol:    [age] yo male ordered enoxaparin 40mg  SQ Q24hr. Patient's est CrCl is 28.215mL/hr. Per protocol for patients with CrCl < 2730mL/min, will transition patient to enoxaparin 30mg  SQ Q24hr.    Pharmacy will continue to monitor and adjust per protocol.   MLS

## 2016-02-18 NOTE — Progress Notes (Signed)
[facility] Pt will transfer once bed available

## 2016-02-18 NOTE — Progress Notes (Signed)
Anticoagulation Protocol:    [age] yo male ordered enoxaparin 60mg  SQ BID. Patient's est CrCl is 28.675mL/hr. Per protocol for patients with CrCl < 5430mL/min, will transition patient to enoxaparin 60mg  SQ Q24hr.    Pharmacy will continue to monitor and adjust per protocol.   Stormy CardKatsoudas,Carlyn Mullenbach K, [facility] 02/18/16, 2:01 PM

## 2016-02-18 NOTE — Care Management Important Message (Signed)
Important Message  Patient Details  Name: Calton GoldsGeorge Henry Danese MRN: [id number] Date of Birth: 04/13/1912   Medicare Important Message Given:  Yes    Gwenette GreetBrenda S Indiya Izquierdo, RN 02/18/2016, 8:43 AM

## 2016-02-18 NOTE — Progress Notes (Signed)
Reported to Dr Thersa SaltVachhanni that [facility]. MD order to discontinue discharge order.

## 2016-02-18 NOTE — Care Management (Addendum)
Admitted [facility] with the diagnosis of acute respiratory failure.  Daughter/HCPOA  is Chase PicketPatricia Armstrong [phone]). [name] states his "daughter cooks for him and comes in and out to help. States an aide comes in Monday-Friday for 2 hours. [facility] in the past. Daughter is in the home on the weekend. [facility] for rehabilitation last July 2017. States he has been to other skilled nursing facilities in the past. "[place] Primary care physician is listed as Dr. Quillian [facility]inceBliss at [facility]. States [facility] [place] times in the past. Fair appetite. Walker, cane, and wheelchair in the home. Chronic foley in place.  Daughter will transport. Will fax update to Vern[facility]Laura [facility]  Gwenette GreetBrenda S Asmaa Tirpak RN MSN CCM Care Management [phone]

## 2016-02-18 NOTE — Care Management Note (Signed)
Case Management Note  Patient Details  Name: Daniel Dickson MRN: [id number] Date of Birth: 03/18/1913  Subjective/Objective:       Patient has VA coverage. Secretary has faxed VA packet. They are on diversion.             Action/Plan:   Expected Discharge Date:                  Expected Discharge Plan:     In-House Referral:     Discharge planning Services     Post Acute Care Choice:    Choice offered to:     DME Arranged:    DME Agency:     HH Arranged:    HH Agency:     Status of Service:     If discussed at [employer] Length of Stay Meetings, dates discussed:    Additional Comments:  Berna BueCheryl Mylani Gentry, RN 02/18/2016, 7:36 AM

## 2016-02-18 NOTE — Consult Note (Signed)
Pharmacy Antibiotic Note  Daniel Dickson is a [age] y.o. male admitted on 02/18/2016 with pneumonia/UTI.  Pharmacy has been consulted for levofloxacin dosing.  Plan: Patient was given 1 dose of levofloxacin 500mg  in ED. Will dose redue to 250mg  q 24hr for renal function  Height: 5\' 6"  (167.6 cm) Weight: 130 lb (59 kg) IBW/kg (Calculated) : 63.8  Temp (24hrs), Avg:98 F (36.7 C), Min:97.8 F (36.6 C), Max:98.1 F (36.7 C)   Recent Labs Lab 02/18/16 0526  WBC 12.7*  CREATININE 1.12  LATICACIDVEN 1.9    Estimated Creatinine Clearance: 28.5 mL/min (by C-G formula based on SCr of 1.12 mg/dL).    Allergies  Allergen Reactions  . Cortisone Other (See Comments)    Other reaction(s): Other (See Comments) GI Upset Unknown reaction  . Dye Fdc Red [Red Dye] Other (See Comments)    Unknown reaction.  . Iodine (Kelp)  [Iodine]     Other reaction(s): Other (See Comments) GI Upset  . Peanut Oil     Other reaction(s): Unknown  . Peanut-Containing Drug Products     Other reaction(s): Unknown  . Penicillins Rash    GI Upset patient not sure of reaction    Antimicrobials this admission: levofloxacin 11/9 >>   Dose adjustments this admission:   Microbiology results: 11/9 BCx:  11/9 UCx:     Thank you for allowing pharmacy to be a part of this patient's care.  Olene FlossMelissa D Chavis Tessler 02/18/2016 10:12 AM

## 2016-02-18 NOTE — Discharge Summary (Signed)
[facility]   PATIENT NAME: Daniel Dickson    MR#:  [id number]  DATE OF BIRTH:  [birth year]  DATE OF ADMISSION:  02/18/2016 ADMITTING PHYSICIAN: Enedina Finner, MD  DATE OF DISCHARGE:02/18/16  PRIMARY CARE PHYSICIAN: BLISS, Doreene Nest, MD    ADMISSION DIAGNOSIS:  Hypoxia [R09.02] Elevated troponin [R74.8] Dyspnea, unspecified type [R06.00]  DISCHARGE DIAGNOSIS:  Acute respiratory failure due to right LL pneumonia -Acute NQWMI H/o CAD with stent in the past COPD-stable  SECONDARY DIAGNOSIS:   Past Medical History:  Diagnosis Date  . Anxiety   . COPD (chronic obstructive pulmonary disease) (HCC)   . Coronary artery disease    a. 2012 s/p MI/cardiac arrest--> PCI mRCA.  Marland Kitchen Hypertensive heart disease   . Mixed hyperlipidemia   . PTSD (post-traumatic stress disorder)    with headaches    HOSPITAL COURSE:  Daniel Dickson  is a 82 y.o. male with a known history of COPD, CAD, hyperlipidemia comes to the emergency room with increasing shortness of breath for last 2 days. Patient [facility] room diagnosed with pneumonia and UTI discharged to home yesterday.  1. Acute respiratory failure secondary to right lower lobe pneumonia. Patient came in with increasing shortness of breath fatigue elevated Litaker count and chest distress suggestive off right lower lobe pneumonia. -IV Levaquin -Incentive spirometer -When necessary nebs -Wean oxygen as able to -Follow blood cultures  2. Leukocytosis secondary to pneumonia  3. History of COPD -Patient appears not wheezing at present I'll hold off on steroids. Continue nebulizer as needed   4.NSTEMI vs  elevated troponin appears to demand ischemia in the setting of pneumonia  howeversincesince troponin is trending up - we'll do cardiology consultation.d/w dr Juliann Pares Echo results pending -Lovenox 1 mg/kg bid, metoprolol -vitals stblae. Pt denies cp -Aspirin, nitroglycerin  when necessary,Imdur, continue statins  5. Chronic Foley catheter for last 7 months likely due to BPH  6. DVT prophylaxis Lovenox  Pt will transfer to [facility] when bed avialable CONSULTS OBTAINED:  Treatment Team:  Alwyn Pea, MD  DRUG ALLERGIES:   Allergies  Allergen Reactions  . Cortisone Other (See Comments)    Other reaction(s): Other (See Comments) GI Upset Unknown reaction  . Dye Fdc Red [Red Dye] Other (See Comments)    Unknown reaction.  . Iodine (Kelp)  [Iodine]     Other reaction(s): Other (See Comments) GI Upset  . Peanut Oil     Other reaction(s): Unknown  . Peanut-Containing Drug Products     Other reaction(s): Unknown  . Penicillins Rash    GI Upset patient not sure of reaction    DISCHARGE MEDICATIONS:   Current Discharge Medication List    CONTINUE these medications which have NOT CHANGED   Details  acetaminophen (TYLENOL) 500 MG tablet Take 500 mg by mouth daily as needed for mild pain or moderate pain.     albuterol (PROAIR HFA) 108 (90 BASE) MCG/ACT inhaler Inhale 2 puffs into the lungs every 6 (six) hours as needed. For wheezing.    albuterol (PROVENTIL) (2.5 MG/3ML) 0.083% nebulizer solution Take 3 mLs (2.5 mg total) by nebulization every 8 (eight) hours as needed for wheezing or shortness of breath. Qty: 75 mL, Refills: 12    aspirin EC 81 MG tablet Take 81 mg by mouth daily.    benzonatate (TESSALON) 200 MG capsule Take 200 mg by mouth 3 (three) times daily as needed for cough.  Cholecalciferol (VITAMIN D3) 1000 UNITS CAPS Take 1,000 Units by mouth daily.    finasteride (PROSCAR) 5 MG tablet Take 1 tablet (5 mg total) by mouth daily. Qty: 15 tablet, Refills: 0    fluticasone furoate-vilanterol (BREO ELLIPTA) 100-25 MCG/INH AEPB Inhale 1 puff into the lungs daily. Reported on 10/19/2015    isosorbide mononitrate (IMDUR) 30 MG 24 hr tablet Take 1 tablet (30 mg total) by mouth daily. Qty: 30 tablet, Refills: 5    loratadine  (CLARITIN) 10 MG tablet Take 10 mg by mouth daily.    magnesium hydroxide (MILK OF MAGNESIA) 400 MG/5ML suspension Take 30 mLs by mouth 2 (two) times daily as needed for mild constipation or moderate constipation. Qty: 360 mL, Refills: 0    Melatonin 5 MG CAPS Take 10 mg by mouth at bedtime.    mupirocin cream (BACTROBAN) 2 % Apply topically daily. Qty: 15 g, Refills: 0    nitroGLYCERIN (NITROSTAT) 0.4 MG SL tablet Place 0.4 mg under the tongue every 5 (five) minutes as needed for chest pain. Reported on 10/19/2015    ondansetron (ZOFRAN) 4 MG tablet Take 1 tablet (4 mg total) by mouth every 6 (six) hours as needed for nausea. Qty: 20 tablet, Refills: 0    pantoprazole (PROTONIX) 40 MG tablet Take 1 tablet (40 mg total) by mouth daily. Qty: 30 tablet, Refills: 5    pravastatin (PRAVACHOL) 80 MG tablet Take 80 mg by mouth daily.    tamsulosin (FLOMAX) 0.4 MG CAPS capsule Take 0.4 mg by mouth daily. Take 30 minutes after same meal each day.    tiotropium (SPIRIVA) 18 MCG inhalation capsule Place 1 capsule (18 mcg total) into inhaler and inhale every evening. Qty: 30 capsule, Refills: 12    feeding supplement (BOOST / RESOURCE BREEZE) LIQD Take 1 Container by mouth 3 (three) times daily between meals. Qty: 90 Container, Refills: 5        If you experience worsening of your admission symptoms, develop shortness of breath, life threatening emergency, suicidal or homicidal thoughts you must seek medical attention immediately by calling 911 or calling your MD immediately  if symptoms less severe.  You Must read complete instructions/literature along with all the possible adverse reactions/side effects for all the Medicines you take and that have been prescribed to you. Take any new Medicines after you have completely understood and accept all the possible adverse reactions/side effects.   Please note  You were cared for by a hospitalist during your hospital stay. If you have any  questions about your discharge medications or the care you received while you were in the hospital after you are discharged, you can call the unit and asked to speak with the hospitalist on call if the hospitalist that took care of you is not available. Once you are discharged, your primary care physician will handle any further medical issues. Please note that NO REFILLS for any discharge medications will be authorized once you are discharged, as it is imperative that you return to your primary care physician (or establish a relationship with a primary care physician if you do not have one) for your aftercare needs so that they can reassess your need for medications and monitor your lab values.   DATA REVIEW:   CBC   Recent Labs Lab 02/18/16 0526  WBC 12.7*  HGB 14.7  HCT 43.3  PLT 396    Chemistries   Recent Labs Lab 02/18/16 0526  NA 131*  K 4.3  CL 96*  CO2 26  GLUCOSE 113*  BUN 22*  CREATININE 1.12  CALCIUM 10.1  AST 50*  ALT 36  ALKPHOS 96  BILITOT 1.0    Microbiology Results   Recent Results (from the past 240 hour(s))  Culture, blood (routine x 2)     Status: None (Preliminary result)   Collection Time: 02/18/16  5:25 AM  Result Value Ref Range Status   Specimen Description BLOOD RIGHT FOREARM  Final   Special Requests   Final    BOTTLES DRAWN AEROBIC AND ANAEROBIC  ANA 1ML AER 2ML   Culture  Setup Time Organism ID to follow  Final   Culture NO GROWTH <12 HOURS  Final   Report Status PENDING  Incomplete  Culture, blood (routine x 2)     Status: None (Preliminary result)   Collection Time: 02/18/16  5:26 AM  Result Value Ref Range Status   Specimen Description BLOOD LEFT FOREARM  Final   Special Requests   Final    BOTTLES DRAWN AEROBIC AND ANAEROBIC  ANA 6ML AER 6ML   Culture NO GROWTH <12 HOURS  Final   Report Status PENDING  Incomplete    RADIOLOGY:  Dg Chest Portable 1 View  Result Date: 02/18/2016 CLINICAL DATA:  Shortness of breath and wheezing  tonight. Diagn[facility] with [facility] and [facility]. EXAM: PORTABLE CHEST 1 VIEW COMPARISON:  10/29/2015 FINDINGS: Normal heart size and pulmonary vascularity. Emphysematous changes and chronic bronchitic changes in the lungs. Increasing infiltration in the lung bases may represent acute pneumonia. Probable small bilateral pleural effusions. No pneumothorax. Calcified and tortuous aorta. IMPRESSION: Emphysematous and chronic bronchitic changes in the lungs. Increasing infiltrates in the lung bases with small bilateral pleural effusions suggesting pneumonia. Electronically Signed   By: Burman NievesWilliam  Stevens M.D.   On: 02/18/2016 05:37     Management plans discussed with the patient, family and they are in agreement.  CODE STATUS:     Code Status Orders        Start     Ordered   02/18/16 0828  Full code  Continuous     02/18/16 0827    Code Status History    Date Active Date Inactive Code Status Order ID Comments User Context   10/29/2015  9:19 PM 11/02/2015  2:22 PM Full Code [id number]  Enid Baasadhika Kalisetti, MD Inpatient   10/07/2015  7:32 AM 10/09/2015  4:29 PM Full Code [id number]  Ihor AustinPavan Pyreddy, MD Inpatient   06/07/2015 12:04 AM 06/10/2015  4:31 PM Full Code [id number]  Oralia Manisavid Willis, MD Inpatient   01/11/2015  1:54 PM 01/12/2015  1:58 PM Full Code [id number]  Alford Highlandichard Wieting, MD ED      TOTAL TIME TAKING CARE OF THIS PATIENT: 40 minutes.    Rosan Calbert M.D on 02/18/2016 at 6:09 PM  Between 7am to 6pm - Pager - [phone] After 6pm go to [email or web address] - password [email or web address] [facility]  [facility]  Office  [phone]  CC: Primary care physician; Dortha KernBLISS, LAURA K, MD

## 2016-02-18 NOTE — ED Notes (Signed)
Called [facility]  Spoke to brad no beds available

## 2016-02-18 NOTE — Plan of Care (Signed)
Spoke with Dr. Allena KatzPatel regarding results of ECHO and elevated troponins 0.89, 2.74 and 2.63. Patient on cardiac monitor, but spoke with MD regarding transfer to 2A. No order received to transfer off of 1C. MD aware of elevated troponins and result of Echo. MD has consulted cardiologist. Awaiting cardiologist. Dr. Allena KatzPatel spoke with RN [facility]. RN updated Press photographerCharge Nurse.

## 2016-02-18 NOTE — ED Notes (Signed)
Verified with admitting MD pt ok to go to non telemetry room

## 2016-02-18 NOTE — ED Provider Notes (Addendum)
[facility] Emergency Department Provider Note   ____________________________________________   First MD Initiated Contact with Patient 02/18/16 [phone]     (approximate)  I have reviewed the triage vital signs and the nursing notes.   HISTORY  Chief Complaint Shortness of Breath   HPI Daniel Dickson is a 82 y.o. male who was seen at [facility] earlier yesterday. He was diagnosed with pneumonia and UTI and sent home. Tonight he developed increasing shortness of breath. His daughter who he lives with called 911. EMS found his O2 sats to be 90%. He got 1 breathing treatment was put on 2 L of oxygen and brought to the emergency room. He does not usually take oxygen. He feels better now. He does not have any other complaints. He does have a deep cough on exam. The cough is not productive.  Past Medical History:  Diagnosis Date  . Anxiety   . COPD (chronic obstructive pulmonary disease) (HCC)   . Coronary artery disease    a. 2012 s/p MI/cardiac arrest--> PCI mRCA.  Marland Kitchen Hypertensive heart disease   . Mixed hyperlipidemia   . PTSD (post-traumatic stress disorder)    with headaches    Patient Active Problem List   Diagnosis Date Noted  . Urinary retention 11/10/2015  . Hyperkalemia 11/02/2015  . Hypotension 11/02/2015  . Dysphagia, pharyngoesophageal phase 11/02/2015  . Esophageal spasm 11/02/2015  . Constipation 11/02/2015  . SIADH (syndrome of inappropriate ADH production) (HCC) 11/02/2015  . Urinary retention due to benign prostatic hyperplasia 11/02/2015  . Chronic indwelling Foley catheter 11/02/2015  . Bacteriuria with pyuria 11/02/2015  . Protein-calorie malnutrition, severe 10/30/2015  . ARF (acute renal failure) (HCC) 10/29/2015  . Dyspnea 10/07/2015  . Hyponatremia 06/06/2015  . Coronary artery disease   . Hypertensive heart disease   . Mixed hyperlipidemia   . COPD (chronic obstructive pulmonary disease) (HCC)   . COPD exacerbation (HCC)  01/11/2015  . Moderate COPD (chronic obstructive pulmonary disease) (HCC) 12/08/2014  . Fall 09/03/2014  . Hip fracture (HCC) 09/03/2014  . PTSD (post-traumatic stress disorder) 09/03/2014  . Bilateral leg edema 09/03/2014  . SOB (shortness of breath) 10/31/2012  . Essential hypertension 10/31/2012  . CAD (coronary artery disease) 10/31/2012  . Hyperlipidemia 10/31/2012  . Tachycardia 10/31/2012    Past Surgical History:  Procedure Laterality Date  . CATARACT EXTRACTION    . CHOLECYSTECTOMY    . CORONARY ANGIOPLASTY     with stent; [facility]  . HAND SURGERY    . HEMORRHOID SURGERY    . HIP SURGERY    . VASCULAR SURGERY     stent R femoral artery    Prior to Admission medications   Medication Sig Start Date End Date Taking? Authorizing Provider  acetaminophen (TYLENOL) 500 MG tablet Take 500 mg by mouth daily as needed for mild pain or moderate pain.     Historical Provider, MD  albuterol (PROAIR HFA) 108 (90 BASE) MCG/ACT inhaler Inhale 2 puffs into the lungs every 6 (six) hours as needed. For wheezing.    Historical Provider, MD  albuterol (PROVENTIL) (2.5 MG/3ML) 0.083% nebulizer solution Take 3 mLs (2.5 mg total) by nebulization every 8 (eight) hours as needed for wheezing or shortness of breath. Patient not taking: Reported on 11/10/2015 10/09/15   Adrian Saran, MD  Alum & Mag Hydroxide-Simeth (GI COCKTAIL) SUSP suspension Take 30 mLs by mouth 3 (three) times daily as needed for indigestion. Shake well. Patient not taking: Reported on 11/10/2015 11/02/15  Katharina Caper, MD  aspirin EC 81 MG tablet Take 81 mg by mouth daily.    Historical Provider, MD  benzonatate (TESSALON) 200 MG capsule Take 200 mg by mouth 3 (three) times daily as needed for cough.    Historical Provider, MD  carisoprodol (SOMA) 350 MG tablet Take 1 tablet (350 mg total) by mouth 3 (three) times daily as needed for muscle spasms. Patient not taking: Reported on 11/10/2015 10/24/15   Myrna Blazer, MD    Cholecalciferol (VITAMIN D3) 1000 UNITS CAPS Take 1,000 Units by mouth daily.    Historical Provider, MD  feeding supplement (BOOST / RESOURCE BREEZE) LIQD Take 1 Container by mouth 3 (three) times daily between meals. 11/02/15   Katharina Caper, MD  finasteride (PROSCAR) 5 MG tablet Take 1 tablet (5 mg total) by mouth daily. 10/20/15 10/19/16  Nita Sickle, MD  fluticasone furoate-vilanterol (BREO ELLIPTA) 100-25 MCG/INH AEPB Inhale 1 puff into the lungs daily. Reported on 10/19/2015    Historical Provider, MD  isosorbide mononitrate (IMDUR) 30 MG 24 hr tablet Take 1 tablet (30 mg total) by mouth daily. 11/02/15   Katharina Caper, MD  loratadine (CLARITIN) 10 MG tablet Take 10 mg by mouth daily.    Historical Provider, MD  magnesium hydroxide (MILK OF MAGNESIA) 400 MG/5ML suspension Take 30 mLs by mouth 2 (two) times daily as needed for mild constipation or moderate constipation. 11/02/15   Katharina Caper, MD  Melatonin 5 MG CAPS Take 10 mg by mouth at bedtime.    Historical Provider, MD  mupirocin cream (BACTROBAN) 2 % Apply topically daily. 11/02/15   Katharina Caper, MD  nitroGLYCERIN (NITROSTAT) 0.4 MG SL tablet Place 0.4 mg under the tongue every 5 (five) minutes as needed for chest pain. Reported on 10/19/2015    Historical Provider, MD  ondansetron (ZOFRAN) 4 MG tablet Take 1 tablet (4 mg total) by mouth every 6 (six) hours as needed for nausea. 11/02/15   Katharina Caper, MD  pantoprazole (PROTONIX) 40 MG tablet Take 1 tablet (40 mg total) by mouth daily. 11/02/15   Katharina Caper, MD  polyethylene glycol (MIRALAX / GLYCOLAX) packet Take 17 g by mouth daily. Patient not taking: Reported on 11/10/2015 11/02/15   Katharina Caper, MD  pravastatin (PRAVACHOL) 80 MG tablet Take 80 mg by mouth daily.    Historical Provider, MD  tamsulosin (FLOMAX) 0.4 MG CAPS capsule Take 0.4 mg by mouth daily. Take 30 minutes after same meal each day.    Historical Provider, MD  tiotropium (SPIRIVA) 18 MCG inhalation capsule Place  1 capsule (18 mcg total) into inhaler and inhale every evening. 10/09/15 10/08/16  Adrian Saran, MD    Allergies Cortisone; Dye fdc red [red dye]; Iodine (kelp)  [iodine]; Peanut oil; Peanut-containing drug products; and Penicillins  Family History  Problem Relation Age of Onset  . COPD Father     Social History Social History  Substance Use Topics  . Smoking status: Former Smoker    Packs/day: 3.00    Years: 40.00    Types: Cigarettes  . Smokeless tobacco: Never Used  . Alcohol use Yes     Comment: wine at night.     Review of Systems Constitutional: No fever/chills Eyes: No visual changes. ENT: No sore throat. Cardiovascular: Denies chest pain. Respiratory: shortness of breath. Gastrointestinal: No abdominal pain.  No nausea, no vomiting.  No diarrhea.  No constipation. Genitourinary: Negative for dysuria. Musculoskeletal: Negative for back pain. Skin: Negative for rash. Neurological: Negative for headaches,  focal weakness or numbness.  10-point ROS otherwise negative.  ____________________________________________   PHYSICAL EXAM:  VITAL SIGNS: ED Triage Vitals  Enc Vitals Group     BP      Pulse      Resp      Temp      Temp src      SpO2      Weight      Height      Head Circumference      Peak Flow      Pain Score      Pain Loc      Pain Edu?      Excl. in GC?     Constitutional: Alert and oriented. Well appearing and in no acute distress. Eyes: Conjunctivae are normal. PERRL. EOMI. Head: Atraumatic. Nose: No congestion/rhinnorhea. Mouth/Throat: Mucous membranes are moist.  Oropharynx non-erythematous. Neck: No stridor.  Cardiovascular: Normal rate, regular rhythm. Grossly normal heart sounds.  Good peripheral circulation. Respiratory: Normal respiratory effort.  No retractions. Lungs Scattered crackles especially in left base.. Gastrointestinal: Soft and nontender. No distention. No abdominal bruits. No CVA tenderness. Musculoskeletal: No lower  extremity tenderness nor edema.  No joint effusions. Neurologic:  Normal speech and language. No gross focal neurologic deficits are appreciated.  Skin:  Skin is warm, dry and intact. No rash noted. Psychiatric: Mood and affect are normal. Speech and behavior are normal.  ____________________________________________   LABS (all labs ordered are listed, but only abnormal results are displayed)  Labs Reviewed  URINALYSIS COMPLETEWITH MICROSCOPIC ([facility] ONLY) - Abnormal; Notable for the following:       Result Value   Color, Urine ORANGE (*)    APPearance CLEAR (*)    Protein, ur 30 (*)    All other components within normal limits  COMPREHENSIVE METABOLIC PANEL - Abnormal; Notable for the following:    Sodium 131 (*)    Chloride 96 (*)    Glucose, Bld 113 (*)    BUN 22 (*)    Total Protein 6.3 (*)    Albumin 3.1 (*)    AST 50 (*)    GFR calc non Af Amer 51 (*)    GFR calc Af Amer 60 (*)    All other components within normal limits  TROPONIN I - Abnormal; Notable for the following:    Troponin I 0.89 (*)    All other components within normal limits  CBC WITH DIFFERENTIAL/PLATELET - Abnormal; Notable for the following:    WBC 12.7 (*)    RDW 15.6 (*)    Neutro Abs 10.3 (*)    All other components within normal limits  URINE CULTURE  CULTURE, BLOOD (ROUTINE X 2)  CULTURE, BLOOD (ROUTINE X 2)  LACTIC ACID, PLASMA  LACTIC ACID, PLASMA  BRAIN NATRIURETIC PEPTIDE   ____________________________________________  EKG  EKG read and interpreted by me shows sinus tachycardia rate of 104 right axis deviation no apparent ST-T wave changes. ____________________________________________  RADIOLOGY  Study Result   CLINICAL DATA:  Shortness of breath and wheezing tonight. [facility]  EXAM: PORTABLE CHEST 1 VIEW  COMPARISON:  10/29/2015  FINDINGS: Normal heart size and pulmonary vascularity.  Emphysematous changes and chronic bronchitic changes in the lungs. Increasing infiltration in the lung bases may represent acute pneumonia. Probable small bilateral pleural effusions. No pneumothorax. Calcified and tortuous aorta.  IMPRESSION: Emphysematous and chronic bronchitic changes in the lungs. Increasing infiltrates in the lung bases  with small bilateral pleural effusions suggesting pneumonia.   Electronically Signed   By: Burman NievesWilliam  Stevens M.D.   On: 02/18/2016 05:37     ____________________________________________   PROCEDURES  Procedure(s) performed:  Procedures  Critical Care performed:  ____________________________________________   INITIAL IMPRESSION / ASSESSMENT AND PLAN / ED COURSE  Pertinent labs & imaging results that were available during my care of the patient were reviewed by me and considered in my medical decision making (see chart for details).       Clinical Course    Patient's troponin level is elevated significantly. Also his chest x-ray looks like pneumonia. I will treat him with Levaquin since he is allergic to penicillins and since he was in the hospital recently. We'll give him aspirin which she says he can take. Hospitalist is on the phone now.  Also the patient's oxygen level is now 91%.We'll put him on oxygen. Patient is a [facility] patient really just contacted the [facility] they do not have any ICU beds. They would put him in an ICU bed.      FINAL CLINICAL IMPRESSION(S) / ED DIAGNOSES  Final diagnoses:  Dyspnea, unspecified type  Hypoxia  Elevated troponin      NEW MEDICATIONS STARTED DURING THIS VISIT:  New Prescriptions   No medications on file     Note:  This document was prepared using Dragon voice recognition software and may include unintentional dictation errors.    Arnaldo NatalPaul F Seda Kronberg, MD 02/18/16 [id number]    Arnaldo NatalPaul F Shaterrica Territo, MD 02/18/16 [id number]    Arnaldo NatalPaul F Mamoudou Mulvehill, MD 02/18/16 [phone]

## 2016-02-18 NOTE — Progress Notes (Addendum)
PT Hold Note  Patient Details Name: Daniel Dickson MRN: [id number] DOB: 10/20/1912   Cancelled Treatment:    Reason Eval/Treat Not Completed: Medical issues which prohibited therapy. Chart reviewed. Pt with elevated troponin 0.89>2.74>2.63. Pt with PMH of CAD with prior MI. Per H&P cardiology will be consulted. Will attempt PT evaluation on later date once pt has ruled out for MI.  Lynnea MaizesJason D Zen Cedillos PT, DPT   Zacaria Pousson 02/18/2016, 3:25 PM

## 2016-02-18 NOTE — Progress Notes (Signed)
*  PRELIMINARY RESULTS* Echocardiogram 2D Echocardiogram has been performed.  Cristela BlueHege, Towanda Hornstein 02/18/2016, 3:21 PM

## 2016-02-18 NOTE — Progress Notes (Signed)
PHARMACY - PHYSICIAN COMMUNICATION CRITICAL VALUE ALERT - BLOOD CULTURE IDENTIFICATION (BCID)  Results for orders placed or performed during the hospital encounter of 02/18/16  Blood Culture ID Panel (Reflexed) (Collected: 02/18/2016  5:25 AM)  Result Value Ref Range   Enterococcus species DETECTED (A) NOT DETECTED   Vancomycin resistance NOT DETECTED NOT DETECTED   Listeria monocytogenes NOT DETECTED NOT DETECTED   Staphylococcus species NOT DETECTED NOT DETECTED   Staphylococcus aureus NOT DETECTED NOT DETECTED   Streptococcus species NOT DETECTED NOT DETECTED   Streptococcus agalactiae NOT DETECTED NOT DETECTED   Streptococcus pneumoniae NOT DETECTED NOT DETECTED   Streptococcus pyogenes NOT DETECTED NOT DETECTED   Acinetobacter baumannii NOT DETECTED NOT DETECTED   Enterobacteriaceae species NOT DETECTED NOT DETECTED   Enterobacter cloacae complex NOT DETECTED NOT DETECTED   Escherichia coli NOT DETECTED NOT DETECTED   Klebsiella oxytoca NOT DETECTED NOT DETECTED   Klebsiella pneumoniae NOT DETECTED NOT DETECTED   Proteus species NOT DETECTED NOT DETECTED   Serratia marcescens NOT DETECTED NOT DETECTED   Haemophilus influenzae NOT DETECTED NOT DETECTED   Neisseria meningitidis NOT DETECTED NOT DETECTED   Pseudomonas aeruginosa NOT DETECTED NOT DETECTED   Candida albicans DETECTED (A) NOT DETECTED   Candida glabrata NOT DETECTED NOT DETECTED   Candida krusei NOT DETECTED NOT DETECTED   Candida parapsilosis NOT DETECTED NOT DETECTED   Candida tropicalis NOT DETECTED NOT DETECTED    Name of physician (or Provider) Contacted: Hower   Changes to prescribed antibiotics required: No, Will continue pt on levaquin.   Eryanna Regal D 02/18/2016  8:15 PM

## 2016-02-18 NOTE — H&P (Signed)
[facility] - [place] at [facility]   PATIENT NAME: Daniel Dickson    MR#:  [id number]  DATE OF BIRTH:  11/19/1912  DATE OF ADMISSION:  02/18/2016  PRIMARY CARE PHYSICIAN: BLISS, Doreene NestLAURA K, MD   REQUESTING/REFERRING PHYSICIAN: Dr Darnelle CatalanMalinda  CHIEF COMPLAINT:   Increasing shortness of breath and cough HISTORY OF PRESENT ILLNESS:  Daniel Dickson  is a [age] y.o. male with a known history of COPD, CAD, hyperlipidemia comes to the emergency room with increasing shortness of breath for last 2 days. Patient [facility] room diagnosed with pneumonia and UTI discharged to home yesterday. He continued to feel fatigued short of breath came to the emergency room sats were 90-91% on room air. Patient was mildly tachycardic no fever was noted and had elevated Zanetti count with a chest x-ray suggestive of right lower lobe pneumonia. Given hand failing outpatient antibiotic treatment patient is being admitted for acute respiratory failure secondary to right lower lobe pneumonia. He received IV Levaquin in the emergency room.  PAST MEDICAL HISTORY:   Past Medical History:  Diagnosis Date  . Anxiety   . COPD (chronic obstructive pulmonary disease) (HCC)   . Coronary artery disease    a. 2012 s/p MI/cardiac arrest--> PCI mRCA.  Marland Kitchen. Hypertensive heart disease   . Mixed hyperlipidemia   . PTSD (post-traumatic stress disorder)    with headaches    PAST SURGICAL HISTOIRY:   Past Surgical History:  Procedure Laterality Date  . CATARACT EXTRACTION    . CHOLECYSTECTOMY    . CORONARY ANGIOPLASTY     with stent; [facility]  . HAND SURGERY    . HEMORRHOID SURGERY    . HIP SURGERY    . VASCULAR SURGERY     stent R femoral artery    SOCIAL HISTORY:   Social History  Substance Use Topics  . Smoking status: Former Smoker    Packs/day: 3.00    Years: 40.00    Types: Cigarettes  . Smokeless tobacco: Never Used  . Alcohol use Yes     Comment: wine at night.      FAMILY HISTORY:   Family History  Problem Relation Age of Onset  . COPD Father     DRUG ALLERGIES:   Allergies  Allergen Reactions  . Cortisone Other (See Comments)    Other reaction(s): Other (See Comments) GI Upset Unknown reaction  . Dye Fdc Red [Red Dye] Other (See Comments)    Unknown reaction.  . Iodine (Kelp)  [Iodine]     Other reaction(s): Other (See Comments) GI Upset  . Peanut Oil     Other reaction(s): Unknown  . Peanut-Containing Drug Products     Other reaction(s): Unknown  . Penicillins Rash    GI Upset patient not sure of reaction    REVIEW OF SYSTEMS:  Review of Systems  Constitutional: Positive for fever. Negative for chills and weight loss.  HENT: Negative for ear discharge, ear pain and nosebleeds.   Eyes: Negative for blurred vision, pain and discharge.  Respiratory: Positive for shortness of breath. Negative for sputum production, wheezing and stridor.   Cardiovascular: Negative for chest pain, palpitations, orthopnea and PND.  Gastrointestinal: Negative for abdominal pain, diarrhea, nausea and vomiting.  Genitourinary: Negative for frequency and urgency.  Musculoskeletal: Negative for back pain and joint pain.  Neurological: Positive for weakness. Negative for sensory change, speech change and focal weakness.  Psychiatric/Behavioral: Negative for depression and hallucinations. The patient is not nervous/anxious.  MEDICATIONS AT HOME:   Prior to Admission medications   Medication Sig Start Date End Date Taking? Authorizing Provider  acetaminophen (TYLENOL) 500 MG tablet Take 500 mg by mouth daily as needed for mild pain or moderate pain.    Yes Historical Provider, MD  albuterol (PROAIR HFA) 108 (90 BASE) MCG/ACT inhaler Inhale 2 puffs into the lungs every 6 (six) hours as needed. For wheezing.   Yes Historical Provider, MD  albuterol (PROVENTIL) (2.5 MG/3ML) 0.083% nebulizer solution Take 3 mLs (2.5 mg total) by nebulization every 8  (eight) hours as needed for wheezing or shortness of breath. 10/09/15  Yes Adrian Saran, MD  aspirin EC 81 MG tablet Take 81 mg by mouth daily.   Yes Historical Provider, MD  benzonatate (TESSALON) 200 MG capsule Take 200 mg by mouth 3 (three) times daily as needed for cough.   Yes Historical Provider, MD  Cholecalciferol (VITAMIN D3) 1000 UNITS CAPS Take 1,000 Units by mouth daily.   Yes Historical Provider, MD  finasteride (PROSCAR) 5 MG tablet Take 1 tablet (5 mg total) by mouth daily. 10/20/15 10/19/16 Yes Nita Sickle, MD  fluticasone furoate-vilanterol (BREO ELLIPTA) 100-25 MCG/INH AEPB Inhale 1 puff into the lungs daily. Reported on 10/19/2015   Yes Historical Provider, MD  isosorbide mononitrate (IMDUR) 30 MG 24 hr tablet Take 1 tablet (30 mg total) by mouth daily. 11/02/15  Yes Katharina Caper, MD  loratadine (CLARITIN) 10 MG tablet Take 10 mg by mouth daily.   Yes Historical Provider, MD  magnesium hydroxide (MILK OF MAGNESIA) 400 MG/5ML suspension Take 30 mLs by mouth 2 (two) times daily as needed for mild constipation or moderate constipation. 11/02/15  Yes Katharina Caper, MD  Melatonin 5 MG CAPS Take 10 mg by mouth at bedtime.   Yes Historical Provider, MD  mupirocin cream (BACTROBAN) 2 % Apply topically daily. 11/02/15  Yes Katharina Caper, MD  nitroGLYCERIN (NITROSTAT) 0.4 MG SL tablet Place 0.4 mg under the tongue every 5 (five) minutes as needed for chest pain. Reported on 10/19/2015   Yes Historical Provider, MD  ondansetron (ZOFRAN) 4 MG tablet Take 1 tablet (4 mg total) by mouth every 6 (six) hours as needed for nausea. 11/02/15  Yes Katharina Caper, MD  pantoprazole (PROTONIX) 40 MG tablet Take 1 tablet (40 mg total) by mouth daily. 11/02/15  Yes Katharina Caper, MD  pravastatin (PRAVACHOL) 80 MG tablet Take 80 mg by mouth daily.   Yes Historical Provider, MD  tamsulosin (FLOMAX) 0.4 MG CAPS capsule Take 0.4 mg by mouth daily. Take 30 minutes after same meal each day.   Yes Historical Provider,  MD  tiotropium (SPIRIVA) 18 MCG inhalation capsule Place 1 capsule (18 mcg total) into inhaler and inhale every evening. 10/09/15 10/08/16 Yes Sital Mody, MD  feeding supplement (BOOST / RESOURCE BREEZE) LIQD Take 1 Container by mouth 3 (three) times daily between meals. 11/02/15   Katharina Caper, MD      VITAL SIGNS:  Blood pressure 109/72, pulse 95, temperature 98.1 F (36.7 C), temperature source Oral, resp. rate 18, height 5\' 6"  (1.676 m), weight 59 kg (130 lb), SpO2 98 %.  PHYSICAL EXAMINATION:  GENERAL:  82 y.o.-year-old patient lying in the bed with no acute distress.  EYES: Pupils equal, round, reactive to light and accommodation. No scleral icterus. Extraocular muscles intact.  HEENT: Head atraumatic, normocephalic. Oropharynx and nasopharynx clear.  NECK:  Supple, no jugular venous distention. No thyroid enlargement, no tenderness.  LUNGS: Normal breath sounds bilaterally, no  wheezing, rales,rhonchi or crepitation. No use of accessory muscles of respiration.  CARDIOVASCULAR: S1, S2 normal. No murmurs, rubs, or gallops.  ABDOMEN: Soft, nontender, nondistended. Bowel sounds present. No organomegaly or mass.  EXTREMITIES: No pedal edema, cyanosis, or clubbing.  NEUROLOGIC: Cranial nerves II through XII are intact. Muscle strength 5/5 in all extremities. Sensation intact. Gait not checked.  PSYCHIATRIC: The patient is alert and oriented x 3.  SKIN: No obvious rash, lesion, or ulcer.   LABORATORY PANEL:   CBC  Recent Labs Lab 02/18/16 0526  WBC 12.7*  HGB 14.7  HCT 43.3  PLT 396   ------------------------------------------------------------------------------------------------------------------  Chemistries   Recent Labs Lab 02/18/16 0526  NA 131*  K 4.3  CL 96*  CO2 26  GLUCOSE 113*  BUN 22*  CREATININE 1.12  CALCIUM 10.1  AST 50*  ALT 36  ALKPHOS 96  BILITOT 1.0    ------------------------------------------------------------------------------------------------------------------  Cardiac Enzymes  Recent Labs Lab 02/18/16 1130  TROPONINI 2.74*   ------------------------------------------------------------------------------------------------------------------  RADIOLOGY:  Dg Chest Portable 1 View  Result Date: 02/18/2016 CLINICAL DATA:  Shortness of breath and wheezing tonight. Diagnosed with pneumonia and [facility] tract [facility] at [facility] yesterday. EXAM: PORTABLE CHEST 1 VIEW COMPARISON:  10/29/2015 FINDINGS: Normal heart size and pulmonary vascularity. Emphysematous changes and chronic bronchitic changes in the lungs. Increasing infiltration in the lung bases may represent acute pneumonia. Probable small bilateral pleural effusions. No pneumothorax. Calcified and tortuous aorta. IMPRESSION: Emphysematous and chronic bronchitic changes in the lungs. Increasing infiltrates in the lung bases with small bilateral pleural effusions suggesting pneumonia. Electronically Signed   By: Burman NievesWilliam  Stevens M.D.   On: 02/18/2016 05:37    EKG:  Sinus rhythm, Q waves in anterior leads. No acute ST elevation or depression  IMPRESSION AND PLAN:   Daniel Dickson  is a [age] y.o. male with a known history of COPD, CAD, hyperlipidemia comes to the emergency room with increasing shortness of breath for last 2 days. [facility] diagnosed with pneumonia and UTI discharged to home yesterday.  1. Acute respiratory failure secondary to right lower lobe pneumonia. Patient came in with increasing shortness of breath fatigue elevated Manalo count and chest distress suggestive off right lower lobe pneumonia. -IV Levaquin -Incentive spirometer -When necessary nebs -Wean oxygen as able to -Follow blood cultures  2. Leukocytosis secondary to pneumonia  3. History of COPD -Patient appears not wheezing at present I'll hold off on  steroids. Continue nebulizer as needed   4. elevated troponin appears to demand ischemia in the setting of pneumonia  howeversincesince troponin is trending up we'll do cardiology consultation. -Lovenox 1 make for kidney twice a day -Aspirin, nitroglycerin when necessary,Imdur, continue statins  5. Chronic Foley catheter for last 7 months likely due to BPH  6. DVT prophylaxis Lovenox  All the records are reviewed and case discussed with ED provider. Management plans discussed with the patient, family and they are in agreement.  CODE STATUS: full (d/w pt)  TOTAL TIME TAKING CARE OF THIS PATIENT: 50minutes.    Obert Espindola M.D on 02/18/2016 at 1:49 PM  Between 7am to 6pm - Pager - [phone]  After 6pm go to [email or web address] - password [email or web address]  [facility]  Office  [phone]  CC: Primary care physician; Dortha KernBLISS, LAURA K, MD

## 2016-02-18 NOTE — ED Triage Notes (Signed)
Pt presents to ED via [facility] with c/o SOB and wheeze. Pt was diagnosed with pneumonia and UTI and [facility] yesterday. O2-90% on room air per EMS, given breathing Tx en-route. Chronic foley catheter. Pt alerts and oriented x3 at this time.

## 2016-02-19 LAB — CBC
HCT: 41.1 % (ref 40.0–52.0)
HEMOGLOBIN: 13.8 g/dL (ref 13.0–18.0)
MCH: 31.4 pg (ref 26.0–34.0)
MCHC: 33.7 g/dL (ref 32.0–36.0)
MCV: 93.2 fL (ref 80.0–100.0)
PLATELETS: 369 10*3/uL (ref 150–440)
RBC: 4.4 MIL/uL (ref 4.40–5.90)
RDW: 15.7 % — ABNORMAL HIGH (ref 11.5–14.5)
WBC: 14.7 10*3/uL — ABNORMAL HIGH (ref 3.8–10.6)

## 2016-02-19 LAB — ECHOCARDIOGRAM COMPLETE
Height: 66 in
Weight: 2080 oz

## 2016-02-19 LAB — URINE CULTURE: Culture: NO GROWTH

## 2016-02-19 LAB — TROPONIN I: TROPONIN I: 1.36 ng/mL — AB (ref <0.03)

## 2016-02-19 MED ORDER — FLUCONAZOLE IN SODIUM CHLORIDE 400-0.9 MG/200ML-% IV SOLN
400.0000 mg | Freq: Once | INTRAVENOUS | Status: AC
  Administered 2016-02-19: 400 mg via INTRAVENOUS
  Filled 2016-02-19: qty 200

## 2016-02-19 MED ORDER — MEROPENEM-SODIUM CHLORIDE 1 GM/50ML IV SOLR
1.0000 g | Freq: Two times a day (BID) | INTRAVENOUS | Status: DC
  Administered 2016-02-19 – 2016-02-22 (×6): 1 g via INTRAVENOUS
  Filled 2016-02-19 (×7): qty 50

## 2016-02-19 MED ORDER — SODIUM CHLORIDE 0.9 % IV SOLN: 1.0000 g | Freq: Two times a day (BID) | INTRAVENOUS | Status: DC

## 2016-02-19 MED ORDER — MELATONIN 5 MG PO TABS
10.0000 mg | ORAL_TABLET | Freq: Every day | ORAL | Status: DC
  Administered 2016-02-19 – 2016-02-21 (×3): 10 mg via ORAL
  Filled 2016-02-19 (×4): qty 2

## 2016-02-19 MED ORDER — FLUCONAZOLE IN SODIUM CHLORIDE 200-0.9 MG/100ML-% IV SOLN
200.0000 mg | INTRAVENOUS | Status: DC
  Administered 2016-02-20: 17:00:00 200 mg via INTRAVENOUS
  Filled 2016-02-19 (×2): qty 100

## 2016-02-19 NOTE — Progress Notes (Signed)
Pharmacy Antibiotic Note  Daniel Dickson is a [age] y.o. male admitted on 02/18/2016 with bacteremia.  Pharmacy has been consulted for fluconazole and meropenem dosing.  Patient was receiving levofloxacin. Antibiotics being changed to fluconazole and meropenem per BCID results of enterococcus and candida albicans bacteremia. Patient has a chronic foley and PCN allergy. He was [facility] ID has been consulted.  Plan: Meropenem 1 g IV q12h  Fluconazole 400 mg IV loading dose now followed by fluconazole 200 mg IV daily per renal function.  Height: 5\' 6"  (167.6 cm) Weight: 130 lb (59 kg) IBW/kg (Calculated) : 63.8  Temp (24hrs), Avg:97.9 F (36.6 C), Min:97.7 F (36.5 C), Max:98.4 F (36.9 C)   Recent Labs Lab 02/18/16 0526 02/19/16 0733  WBC 12.7* 14.7*  CREATININE 1.12  --   LATICACIDVEN 1.9  --     Estimated Creatinine Clearance: 28.5 mL/min (by C-G formula based on SCr of 1.12 mg/dL).    Allergies  Allergen Reactions  . Cortisone Other (See Comments)    Other reaction(s): Other (See Comments) GI Upset Unknown reaction  . Dye Fdc Red [Red Dye] Other (See Comments)    Unknown reaction.  . Iodine (Kelp)  [Iodine]     Other reaction(s): Other (See Comments) GI Upset  . Peanut Oil     Other reaction(s): Unknown  . Peanut-Containing Drug Products     Other reaction(s): Unknown  . Penicillins Rash    GI Upset patient not sure of reaction    Antimicrobials this admission: meropenem 11/10 >>  fluconazole 11/10 >>  Levofloxacin 11/9 >> 11/10  Dose adjustments this admission:  Microbiology results: 11/9 BCx: Enterococcus and candida albicans, sensitivities pending 11/9 UCx: No growth final   Thank you for allowing pharmacy to be a part of this patient's care.  Cindi CarbonMary M Briena Swingler, PharmD, BCPS Clinical Pharmacist 02/19/2016 3:17 PM

## 2016-02-19 NOTE — Consult Note (Signed)
Reason for Consult: Shortness of breath elevated troponins Referring Physician: Sona Patel hospitalist, Katherine Bliss primary  Daniel Dickson is an 82 y.o. male.  HPI: 82-year-old male known history of COPD coronary disease hyperlipidemia shortness of breath dyspnea. Patient was seen in emergency room treated for bronchitis at [facility]. Patient got progressively worse with fever cough congestion came back to emergency room with shortness of breath and hypoxemia chest x-ray suggested right lower lobe pneumonia as he was admitted because of failing outpatient therapy now receiving IV Levaquin. Incidentally was found to have elevated troponin denied any significant chest pain just congestion and shortness of breath. Patient now being admitted for further evaluation and management he has known myocardial infarction PCI and stent of RCA 2012.  Past Medical History:  Diagnosis Date  . Anxiety   . COPD (chronic obstructive pulmonary disease) (HCC)   . Coronary artery disease    a. 2012 s/p MI/cardiac arrest--> PCI mRCA.  . Hypertensive heart disease   . Mixed hyperlipidemia   . PTSD (post-traumatic stress disorder)    with headaches    Past Surgical History:  Procedure Laterality Date  . CATARACT EXTRACTION    . CHOLECYSTECTOMY    . CORONARY ANGIOPLASTY     with stent; [facility]  . HAND SURGERY    . HEMORRHOID SURGERY    . HIP SURGERY    . VASCULAR SURGERY     stent R femoral artery    Family History  Problem Relation Age of Onset  . COPD Father     Social History:  reports that he has quit smoking. His smoking use included Cigarettes. He has a 120.00 pack-year smoking history. He has never used smokeless tobacco. He reports that he drinks alcohol. He reports that he does not use drugs.  Allergies:  Allergies  Allergen Reactions  . Cortisone Other (See Comments)    Other reaction(s): Other (See Comments) GI Upset Unknown reaction  . Dye Fdc Red [Red Dye] Other (See Comments)     Unknown reaction.  . Iodine (Kelp)  [Iodine]     Other reaction(s): Other (See Comments) GI Upset  . Peanut Oil     Other reaction(s): Unknown  . Peanut-Containing Drug Products     Other reaction(s): Unknown  . Penicillins Rash    GI Upset patient not sure of reaction    Medications: I have reviewed the patient's current medications.  Results for orders placed or performed during the hospital encounter of 02/18/16 (from the past 48 hour(s))  Urinalysis complete, with microscopic     Status: Abnormal   Collection Time: 02/18/16  5:25 AM  Result Value Ref Range   Color, Urine ORANGE (A) YELLOW   APPearance CLEAR (A) CLEAR   Glucose, UA NEGATIVE NEGATIVE mg/dL   Bilirubin Urine NEGATIVE NEGATIVE   Ketones, ur NEGATIVE NEGATIVE mg/dL   Specific Gravity, Urine 1.013 1.005 - 1.030   Hgb urine dipstick NEGATIVE NEGATIVE   pH 6.0 5.0 - 8.0   Protein, ur 30 (A) NEGATIVE mg/dL   Nitrite NEGATIVE NEGATIVE   Leukocytes, UA NEGATIVE NEGATIVE   RBC / HPF 0-5 0 - 5 RBC/hpf   WBC, UA 0-5 0 - 5 WBC/hpf   Bacteria, UA NONE SEEN NONE SEEN   Squamous Epithelial / LPF NONE SEEN NONE SEEN   Mucous PRESENT    Hyaline Casts, UA PRESENT   Culture, blood (routine x 2)     Status: None (Preliminary result)   Collection Time: 02/18/16  5:25   AM  Result Value Ref Range   Specimen Description BLOOD RIGHT FOREARM    Special Requests      BOTTLES DRAWN AEROBIC AND ANAEROBIC  ANA 1ML AER 2ML   Culture  Setup Time      GRAM POSITIVE COCCI IN BOTH AEROBIC AND ANAEROBIC BOTTLES CRITICAL RESULT CALLED TO, READ BACK BY AND VERIFIED WITH: JASON ROBBINS 02/18/16 @ 1922  MLK Performed at [facility]    Culture GRAM POSITIVE COCCI    Report Status PENDING   Blood Culture ID Panel (Reflexed)     Status: Abnormal   Collection Time: 02/18/16  5:25 AM  Result Value Ref Range   Enterococcus species DETECTED (A) NOT DETECTED    Comment: CRITICAL RESULT CALLED TO, READ BACK BY AND VERIFIED WITH: JASON  ROBBINS 02/18/16 @ 1922  MLK    Vancomycin resistance NOT DETECTED NOT DETECTED   Listeria monocytogenes NOT DETECTED NOT DETECTED   Staphylococcus species NOT DETECTED NOT DETECTED   Staphylococcus aureus NOT DETECTED NOT DETECTED   Streptococcus species NOT DETECTED NOT DETECTED   Streptococcus agalactiae NOT DETECTED NOT DETECTED   Streptococcus pneumoniae NOT DETECTED NOT DETECTED   Streptococcus pyogenes NOT DETECTED NOT DETECTED   Acinetobacter baumannii NOT DETECTED NOT DETECTED   Enterobacteriaceae species NOT DETECTED NOT DETECTED   Enterobacter cloacae complex NOT DETECTED NOT DETECTED   Escherichia coli NOT DETECTED NOT DETECTED   Klebsiella oxytoca NOT DETECTED NOT DETECTED   Klebsiella pneumoniae NOT DETECTED NOT DETECTED   Proteus species NOT DETECTED NOT DETECTED   Serratia marcescens NOT DETECTED NOT DETECTED   Haemophilus influenzae NOT DETECTED NOT DETECTED   Neisseria meningitidis NOT DETECTED NOT DETECTED   Pseudomonas aeruginosa NOT DETECTED NOT DETECTED   Candida albicans DETECTED (A) NOT DETECTED    Comment: CRITICAL RESULT CALLED TO, READ BACK BY AND VERIFIED WITH: JASON ROBBINS 02/18/16 @ 1922  MLK    Candida glabrata NOT DETECTED NOT DETECTED   Candida krusei NOT DETECTED NOT DETECTED   Candida parapsilosis NOT DETECTED NOT DETECTED   Candida tropicalis NOT DETECTED NOT DETECTED  Culture, blood (routine x 2)     Status: None (Preliminary result)   Collection Time: 02/18/16  5:26 AM  Result Value Ref Range   Specimen Description BLOOD LEFT FOREARM    Special Requests      BOTTLES DRAWN AEROBIC AND ANAEROBIC  ANA 6ML AER 6ML   Culture NO GROWTH <12 HOURS    Report Status PENDING   Comprehensive metabolic panel     Status: Abnormal   Collection Time: 02/18/16  5:26 AM  Result Value Ref Range   Sodium 131 (L) 135 - 145 mmol/L   Potassium 4.3 3.5 - 5.1 mmol/L   Chloride 96 (L) 101 - 111 mmol/L   CO2 26 22 - 32 mmol/L   Glucose, Bld 113 (H) 65 - 99 mg/dL    BUN 22 (H) 6 - 20 mg/dL   Creatinine, Ser 1.12 0.61 - 1.24 mg/dL   Calcium 10.1 8.9 - 10.3 mg/dL   Total Protein 6.3 (L) 6.5 - 8.1 g/dL   Albumin 3.1 (L) 3.5 - 5.0 g/dL   AST 50 (H) 15 - 41 U/L   ALT 36 17 - 63 U/L   Alkaline Phosphatase 96 38 - 126 U/L   Total Bilirubin 1.0 0.3 - 1.2 mg/dL   GFR calc non Af Amer 51 (L) >60 mL/min   GFR calc Af Amer 60 (L) >60 mL/min      Comment: (NOTE) The eGFR has been calculated using the CKD EPI equation. This calculation has not been validated in all clinical situations. eGFR's persistently <60 mL/min signify possible Chronic Kidney Disease.    Anion gap 9 5 - 15  Troponin I     Status: Abnormal   Collection Time: 02/18/16  5:26 AM  Result Value Ref Range   Troponin I 0.89 (HH) <0.03 ng/mL    Comment: CRITICAL RESULT CALLED TO, READ BACK BY AND VERIFIED WITH KUCH YUAL AT 0620 ON 02/18/16...[facility]   Lactic acid, plasma     Status: None   Collection Time: 02/18/16  5:26 AM  Result Value Ref Range   Lactic Acid, Venous 1.9 0.5 - 1.9 mmol/L  CBC with Differential     Status: Abnormal   Collection Time: 02/18/16  5:26 AM  Result Value Ref Range   WBC 12.7 (H) 3.8 - 10.6 K/uL   RBC 4.63 4.40 - 5.90 MIL/uL   Hemoglobin 14.7 13.0 - 18.0 g/dL   HCT 43.3 40.0 - 52.0 %   MCV 93.7 80.0 - 100.0 fL   MCH 31.8 26.0 - 34.0 pg   MCHC 33.9 32.0 - 36.0 g/dL   RDW 15.6 (H) 11.5 - 14.5 %   Platelets 396 150 - 440 K/uL   Neutrophils Relative % 81 %   Neutro Abs 10.3 (H) 1.4 - 6.5 K/uL   Lymphocytes Relative 11 %   Lymphs Abs 1.4 1.0 - 3.6 K/uL   Monocytes Relative 4 %   Monocytes Absolute 0.5 0.2 - 1.0 K/uL   Eosinophils Relative 3 %   Eosinophils Absolute 0.3 0 - 0.7 K/uL   Basophils Relative 1 %   Basophils Absolute 0.1 0 - 0.1 K/uL  Brain natriuretic peptide     Status: None   Collection Time: 02/18/16  5:26 AM  Result Value Ref Range   B Natriuretic Peptide 93.0 0.0 - 100.0 pg/mL  Troponin I     Status: Abnormal   Collection Time: 02/18/16  11:30 AM  Result Value Ref Range   Troponin I 2.74 (HH) <0.03 ng/mL    Comment: CRITICAL VALUE NOTED. VALUE IS CONSISTENT WITH PREVIOUSLY REPORTED/CALLED VALUE KLW  Troponin I     Status: Abnormal   Collection Time: 02/18/16  2:28 PM  Result Value Ref Range   Troponin I 2.63 (HH) <0.03 ng/mL    Comment: CRITICAL VALUE NOTED. VALUE IS CONSISTENT WITH PREVIOUSLY REPORTED/CALLED VALUE KLW  Troponin I     Status: Abnormal   Collection Time: 02/18/16 10:10 PM  Result Value Ref Range   Troponin I 2.06 (HH) <0.03 ng/mL    Comment: CRITICAL VALUE NOTED. VALUE IS CONSISTENT WITH PREVIOUSLY REPORTED/CALLED VALUE SNJ  CBC     Status: Abnormal   Collection Time: 02/19/16  7:33 AM  Result Value Ref Range   WBC 14.7 (H) 3.8 - 10.6 K/uL   RBC 4.40 4.40 - 5.90 MIL/uL   Hemoglobin 13.8 13.0 - 18.0 g/dL   HCT 41.1 40.0 - 52.0 %   MCV 93.2 80.0 - 100.0 fL   MCH 31.4 26.0 - 34.0 pg   MCHC 33.7 32.0 - 36.0 g/dL   RDW 15.7 (H) 11.5 - 14.5 %   Platelets 369 150 - 440 K/uL    Dg Chest Portable 1 View  Result Date: 02/18/2016 CLINICAL DATA:  Shortness of breath and wheezing tonight. Diagnosed with pneumonia and urinary tract infection at [facility] yesterday. EXAM: PORTABLE CHEST 1 VIEW COMPARISON:    10/29/2015 FINDINGS: Normal heart size and pulmonary vascularity. Emphysematous changes and chronic bronchitic changes in the lungs. Increasing infiltration in the lung bases may represent acute pneumonia. Probable small bilateral pleural effusions. No pneumothorax. Calcified and tortuous aorta. IMPRESSION: Emphysematous and chronic bronchitic changes in the lungs. Increasing infiltrates in the lung bases with small bilateral pleural effusions suggesting pneumonia. Electronically Signed   By: William  Stevens M.D.   On: 02/18/2016 05:37    Review of Systems  Constitutional: Positive for chills, diaphoresis, fever, malaise/fatigue and weight loss.  HENT: Positive for congestion.   Eyes: Negative.    Respiratory: Positive for cough, sputum production and shortness of breath.   Cardiovascular: Positive for palpitations.  Gastrointestinal: Negative.   Genitourinary: Negative.   Musculoskeletal: Negative.   Skin: Negative.   Neurological: Positive for weakness.  Endo/Heme/Allergies: Negative.   Psychiatric/Behavioral: Negative.    Blood pressure 120/65, pulse (!) 101, temperature 97.7 F (36.5 C), temperature source Oral, resp. rate 20, height 5' 6" (1.676 m), weight 59 kg (130 lb), SpO2 93 %. Physical Exam  Nursing note and vitals reviewed. Constitutional: He is oriented to person, place, and time. He appears well-developed and well-nourished.  HENT:  Head: Normocephalic and atraumatic.  Eyes: Conjunctivae and EOM are normal. Pupils are equal, round, and reactive to light.  Neck: Normal range of motion. Neck supple.  Cardiovascular: Normal rate and regular rhythm.   Respiratory: Effort normal and breath sounds normal.  GI: Soft. Bowel sounds are normal.  Musculoskeletal: Normal range of motion.  Neurological: He is alert and oriented to person, place, and time. He has normal reflexes.  Skin: Skin is warm and dry.  Psychiatric: He has a normal mood and affect.    Assessment/Plan: Shortness of breath Elevated troponin Pneumonia Congestion Respiratory failure Hypertension Coronary disease Hyperlipidemia Tachycardia COPD Hyperlipidemia Leg edema PTSD . PLAN Agree with supplemental oxygen therapy Broad-spectrum antibiotic therapy Continue inhalers Continue short-term anticoagulation for 24-48 hours Agree with echocardiogram Do not recommend invasive strategy Previous history of smoking continue to advise to refrain from tobacco abuse Continue with diuretics therapy for GERD symptoms Agree with Pravachol for hyperlipidemia Continue chronic Foley because of severe BPH   D  02/19/2016, 8:04 AM     

## 2016-02-19 NOTE — Consult Note (Signed)
[facility]     Reason for Consult:Candidemia, bactremia    Referring Physician: Nicholes Mango Date of Admission:  02/18/2016   Active Problems:   Acute respiratory failure (HCC)   HPI: Daniel Dickson is a 82 y.o. male admitted with sob and recent dx of PNA and UTI at [facility].  On admit his wbc was 12 and he has been afebrile but bcx is + candida and enterococcus.  He reports mainly chronic cough, 30 # wt loss, and great difficulty swallowing. He has a chronic foley for several months but cannot tell me why.   Past Medical History:  Diagnosis Date  . Anxiety   . COPD (chronic obstructive pulmonary disease) ([place])   . Coronary artery disease    a. 2012 s/p MI/cardiac arrest--> PCI mRCA.  Marland Kitchen Hypertensive heart disease   . Mixed hyperlipidemia   . PTSD (post-traumatic stress disorder)    with headaches   Past Surgical History:  Procedure Laterality Date  . CATARACT EXTRACTION    . CHOLECYSTECTOMY    . CORONARY ANGIOPLASTY     with stent; [facility]  . HAND SURGERY    . HEMORRHOID SURGERY    . HIP SURGERY    . VASCULAR SURGERY     stent R femoral artery   Social History  Substance Use Topics  . Smoking status: Former Smoker    Packs/day: 3.00    Years: 40.00    Types: Cigarettes  . Smokeless tobacco: Never Used  . Alcohol use Yes     Comment: wine at night.    Family History  Problem Relation Age of Onset  . COPD Father     Allergies:  Allergies  Allergen Reactions  . Cortisone Other (See Comments)    Other reaction(s): Other (See Comments) GI Upset Unknown reaction  . Dye Fdc Red [Red Dye] Other (See Comments)    Unknown reaction.  . Iodine (Kelp)  [Iodine]     Other reaction(s): Other (See Comments) GI Upset  . Peanut Oil     Other reaction(s): Unknown  . Peanut-Containing Drug Products     Other reaction(s): Unknown  . Penicillins Rash    GI Upset patient not sure of reaction    Current antibiotics: Antibiotics Given (last 72  hours)    Date/Time Action Medication Dose Rate   02/19/16 0937 Given   Levofloxacin (LEVAQUIN) IVPB 250 mg 250 mg 50 mL/hr      MEDICATIONS: . albuterol  2.5 mg Nebulization Q6H  . aspirin EC  81 mg Oral Daily  . cholecalciferol  1,000 Units Oral Daily  . enoxaparin (LOVENOX) injection  1 mg/kg Subcutaneous Q24H  . feeding supplement (ENSURE ENLIVE)  237 mL Oral TID WC  . finasteride  5 mg Oral Daily  . fluticasone furoate-vilanterol  1 puff Inhalation Daily  . isosorbide mononitrate  30 mg Oral Daily  . levofloxacin (LEVAQUIN) IV  250 mg Intravenous Q24H  . loratadine  10 mg Oral Daily  . Melatonin  10 mg Oral QHS  . metoprolol tartrate  12.5 mg Oral BID  . pantoprazole  40 mg Oral Daily  . pravastatin  80 mg Oral Daily  . tamsulosin  0.4 mg Oral Daily  . tiotropium  18 mcg Inhalation QPM   Review of Systems - 11 systems reviewed and negative per HPI  OBJECTIVE: Temp:  [97.7 F (36.5 C)-98.4 F (36.9 C)] 97.7 F (36.5 C) (11/10 1351) Pulse Rate:  [98-107] 103 (11/10 1351) Resp:  [  16-20] 19 (11/10 1351) BP: (90-120)/(56-71) 91/56 (11/10 1351) SpO2:  [92 %-98 %] 98 % (11/10 1351) Physical Exam  Constitutional: He is oriented to person, place, and time. Frail, HENT: anicteric Mouth/Throat: Oropharynx is clear and dry. No oropharyngeal exudate.  Cardiovascular:very distant Pulmonary/Chest: poor air movement Abdominal: Soft. Bowel sounds are normal. He exhibits no distension. There is no tenderness.  Lymphadenopathy: He has no cervical adenopathy.  Neurological: He is alert and oriented to person, place, and time.  Skin: multiple skin bruising and scaly skin  Psychiatric: He has a normal mood and affect. His behavior is normal.    LABS: Results for orders placed or performed during the hospital encounter of 02/18/16 (from the past 48 hour(s))  Urinalysis complete, with microscopic     Status: Abnormal   Collection Time: 02/18/16  5:25 AM  Result Value Ref Range    Color, Urine ORANGE (A) YELLOW   APPearance CLEAR (A) CLEAR   Glucose, UA NEGATIVE NEGATIVE mg/dL   Bilirubin Urine NEGATIVE NEGATIVE   Ketones, ur NEGATIVE NEGATIVE mg/dL   Specific Gravity, Urine 1.013 1.005 - 1.030   Hgb urine dipstick NEGATIVE NEGATIVE   pH 6.0 5.0 - 8.0   Protein, ur 30 (A) NEGATIVE mg/dL   Nitrite NEGATIVE NEGATIVE   Leukocytes, UA NEGATIVE NEGATIVE   RBC / HPF 0-5 0 - 5 RBC/hpf   WBC, UA 0-5 0 - 5 WBC/hpf   Bacteria, UA NONE SEEN NONE SEEN   Squamous Epithelial / LPF NONE SEEN NONE SEEN   Mucous PRESENT    Hyaline Casts, UA PRESENT   Urine culture     Status: None   Collection Time: 02/18/16  5:25 AM  Result Value Ref Range   Specimen Description URINE, CATHETERIZED    Special Requests NONE    Culture NO GROWTH Performed at [facility]     Report Status 02/19/2016 FINAL   Culture, blood (routine x 2)     Status: None (Preliminary result)   Collection Time: 02/18/16  5:25 AM  Result Value Ref Range   Specimen Description BLOOD RIGHT FOREARM    Special Requests      BOTTLES DRAWN AEROBIC AND ANAEROBIC  ANA 1ML AER 2ML   Culture  Setup Time      GRAM POSITIVE COCCI IN BOTH AEROBIC AND ANAEROBIC BOTTLES CRITICAL RESULT CALLED TO, READ BACK BY AND VERIFIED WITH: JASON ROBBINS 02/18/16 @ 1922  MLK    Culture      GRAM POSITIVE COCCI CULTURE REINCUBATED FOR BETTER GROWTH Performed at [facility]    Report Status PENDING   Blood Culture ID Panel (Reflexed)     Status: Abnormal   Collection Time: 02/18/16  5:25 AM  Result Value Ref Range   Enterococcus species DETECTED (A) NOT DETECTED    Comment: CRITICAL RESULT CALLED TO, READ BACK BY AND VERIFIED WITH: JASON ROBBINS 02/18/16 @ 1922  MLK    Vancomycin resistance NOT DETECTED NOT DETECTED   Listeria monocytogenes NOT DETECTED NOT DETECTED   Staphylococcus species NOT DETECTED NOT DETECTED   Staphylococcus aureus NOT DETECTED NOT DETECTED   Streptococcus species NOT DETECTED NOT  DETECTED   Streptococcus agalactiae NOT DETECTED NOT DETECTED   Streptococcus pneumoniae NOT DETECTED NOT DETECTED   Streptococcus pyogenes NOT DETECTED NOT DETECTED   Acinetobacter baumannii NOT DETECTED NOT DETECTED   Enterobacteriaceae species NOT DETECTED NOT DETECTED   Enterobacter cloacae complex NOT DETECTED NOT DETECTED   Escherichia coli NOT DETECTED NOT DETECTED  Klebsiella oxytoca NOT DETECTED NOT DETECTED   Klebsiella pneumoniae NOT DETECTED NOT DETECTED   Proteus species NOT DETECTED NOT DETECTED   Serratia marcescens NOT DETECTED NOT DETECTED   Haemophilus influenzae NOT DETECTED NOT DETECTED   Neisseria meningitidis NOT DETECTED NOT DETECTED   Pseudomonas aeruginosa NOT DETECTED NOT DETECTED   Candida albicans DETECTED (A) NOT DETECTED    Comment: CRITICAL RESULT CALLED TO, READ BACK BY AND VERIFIED WITH: JASON ROBBINS 02/18/16 @ 1922  MLK    Candida glabrata NOT DETECTED NOT DETECTED   Candida krusei NOT DETECTED NOT DETECTED   Candida parapsilosis NOT DETECTED NOT DETECTED   Candida tropicalis NOT DETECTED NOT DETECTED  Culture, blood (routine x 2)     Status: None (Preliminary result)   Collection Time: 02/18/16  5:26 AM  Result Value Ref Range   Specimen Description BLOOD LEFT FOREARM    Special Requests      BOTTLES DRAWN AEROBIC AND ANAEROBIC  ANA 6ML AER 6ML   Culture NO GROWTH 1 DAY    Report Status PENDING   Comprehensive metabolic panel     Status: Abnormal   Collection Time: 02/18/16  5:26 AM  Result Value Ref Range   Sodium 131 (L) 135 - 145 mmol/L   Potassium 4.3 3.5 - 5.1 mmol/L   Chloride 96 (L) 101 - 111 mmol/L   CO2 26 22 - 32 mmol/L   Glucose, Bld 113 (H) 65 - 99 mg/dL   BUN 22 (H) 6 - 20 mg/dL   Creatinine, Ser 1.12 0.61 - 1.24 mg/dL   Calcium 10.1 8.9 - 10.3 mg/dL   Total Protein 6.3 (L) 6.5 - 8.1 g/dL   Albumin 3.1 (L) 3.5 - 5.0 g/dL   AST 50 (H) 15 - 41 U/L   ALT 36 17 - 63 U/L   Alkaline Phosphatase 96 38 - 126 U/L   Total Bilirubin  1.0 0.3 - 1.2 mg/dL   GFR calc non Af Amer 51 (L) >60 mL/min   GFR calc Af Amer 60 (L) >60 mL/min    Comment: (NOTE) The eGFR has been calculated using the CKD EPI equation. This calculation has not been validated in all clinical situations. eGFR's persistently <60 mL/min signify possible Chronic Kidney Disease.    Anion gap 9 5 - 15  Troponin I     Status: Abnormal   Collection Time: 02/18/16  5:26 AM  Result Value Ref Range   Troponin I 0.89 (HH) <0.03 ng/mL    Comment: CRITICAL RESULT CALLED TO, READ BACK BY AND VERIFIED WITH [facility] YUAL AT 2683 ON 02/18/16.Marland KitchenMarland KitchenHarahan   Lactic acid, plasma     Status: None   Collection Time: 02/18/16  5:26 AM  Result Value Ref Range   Lactic Acid, Venous 1.9 0.5 - 1.9 mmol/L  CBC with Differential     Status: Abnormal   Collection Time: 02/18/16  5:26 AM  Result Value Ref Range   WBC 12.7 (H) 3.8 - 10.6 K/uL   RBC 4.63 4.40 - 5.90 MIL/uL   Hemoglobin 14.7 13.0 - 18.0 g/dL   HCT 43.3 40.0 - 52.0 %   MCV 93.7 80.0 - 100.0 fL   MCH 31.8 26.0 - 34.0 pg   MCHC 33.9 32.0 - 36.0 g/dL   RDW 15.6 (H) 11.5 - 14.5 %   Platelets 396 150 - 440 K/uL   Neutrophils Relative % 81 %   Neutro Abs 10.3 (H) 1.4 - 6.5 K/uL   Lymphocytes Relative 11 %  Lymphs Abs 1.4 1.0 - 3.6 K/uL   Monocytes Relative 4 %   Monocytes Absolute 0.5 0.2 - 1.0 K/uL   Eosinophils Relative 3 %   Eosinophils Absolute 0.3 0 - 0.7 K/uL   Basophils Relative 1 %   Basophils Absolute 0.1 0 - 0.1 K/uL  Brain natriuretic peptide     Status: None   Collection Time: 02/18/16  5:26 AM  Result Value Ref Range   B Natriuretic Peptide 93.0 0.0 - 100.0 pg/mL  Troponin I     Status: Abnormal   Collection Time: 02/18/16 11:30 AM  Result Value Ref Range   Troponin I 2.74 (HH) <0.03 ng/mL    Comment: CRITICAL VALUE NOTED. VALUE IS CONSISTENT WITH PREVIOUSLY REPORTED/CALLED VALUE KLW  Troponin I     Status: Abnormal   Collection Time: 02/18/16  2:28 PM  Result Value Ref Range   Troponin I 2.63  (HH) <0.03 ng/mL    Comment: CRITICAL VALUE NOTED. VALUE IS CONSISTENT WITH PREVIOUSLY REPORTED/CALLED VALUE KLW  Troponin I     Status: Abnormal   Collection Time: 02/18/16 10:10 PM  Result Value Ref Range   Troponin I 2.06 (HH) <0.03 ng/mL    Comment: CRITICAL VALUE NOTED. VALUE IS CONSISTENT WITH PREVIOUSLY REPORTED/CALLED VALUE SNJ  CBC     Status: Abnormal   Collection Time: 02/19/16  7:33 AM  Result Value Ref Range   WBC 14.7 (H) 3.8 - 10.6 K/uL   RBC 4.40 4.40 - 5.90 MIL/uL   Hemoglobin 13.8 13.0 - 18.0 g/dL   HCT 41.1 40.0 - 52.0 %   MCV 93.2 80.0 - 100.0 fL   MCH 31.4 26.0 - 34.0 pg   MCHC 33.7 32.0 - 36.0 g/dL   RDW 15.7 (H) 11.5 - 14.5 %   Platelets 369 150 - 440 K/uL  Troponin I (q 6hr x 3)     Status: Abnormal   Collection Time: 02/19/16  7:33 AM  Result Value Ref Range   Troponin I 1.36 (HH) <0.03 ng/mL    Comment: CRITICAL RESULT CALLED TO, READ BACK BY AND VERIFIED WITH KRISTA YOUNG AT 0825 02/19/16 DAS    No components found for: ESR, C REACTIVE PROTEIN MICRO: Recent Results (from the past 720 hour(s))  Urine culture     Status: None   Collection Time: 02/18/16  5:25 AM  Result Value Ref Range Status   Specimen Description URINE, CATHETERIZED  Final   Special Requests NONE  Final   Culture NO GROWTH Performed at [facility]   Final   Report Status 02/19/2016 FINAL  Final  Culture, blood (routine x 2)     Status: None (Preliminary result)   Collection Time: 02/18/16  5:25 AM  Result Value Ref Range Status   Specimen Description BLOOD RIGHT FOREARM  Final   Special Requests   Final    BOTTLES DRAWN AEROBIC AND ANAEROBIC  ANA 1ML AER 2ML   Culture  Setup Time   Final    GRAM POSITIVE COCCI IN BOTH AEROBIC AND ANAEROBIC BOTTLES CRITICAL RESULT CALLED TO, READ BACK BY AND VERIFIED WITH: JASON ROBBINS 02/18/16 @ 1922  [place]    Culture   Final    GRAM POSITIVE COCCI CULTURE REINCUBATED FOR BETTER GROWTH Performed at [facility]    Report  Status PENDING  Incomplete  Blood Culture ID Panel (Reflexed)     Status: Abnormal   Collection Time: 02/18/16  5:25 AM  Result Value Ref Range Status  Enterococcus species DETECTED (A) NOT DETECTED Final    Comment: CRITICAL RESULT CALLED TO, READ BACK BY AND VERIFIED WITH: JASON ROBBINS 02/18/16 @ 1922  MLK    Vancomycin resistance NOT DETECTED NOT DETECTED Final   Listeria monocytogenes NOT DETECTED NOT DETECTED Final   Staphylococcus species NOT DETECTED NOT DETECTED Final   Staphylococcus aureus NOT DETECTED NOT DETECTED Final   Streptococcus species NOT DETECTED NOT DETECTED Final   Streptococcus agalactiae NOT DETECTED NOT DETECTED Final   Streptococcus pneumoniae NOT DETECTED NOT DETECTED Final   Streptococcus pyogenes NOT DETECTED NOT DETECTED Final   Acinetobacter baumannii NOT DETECTED NOT DETECTED Final   Enterobacteriaceae species NOT DETECTED NOT DETECTED Final   Enterobacter cloacae complex NOT DETECTED NOT DETECTED Final   Escherichia coli NOT DETECTED NOT DETECTED Final   Klebsiella oxytoca NOT DETECTED NOT DETECTED Final   Klebsiella pneumoniae NOT DETECTED NOT DETECTED Final   Proteus species NOT DETECTED NOT DETECTED Final   Serratia marcescens NOT DETECTED NOT DETECTED Final   Haemophilus influenzae NOT DETECTED NOT DETECTED Final   Neisseria meningitidis NOT DETECTED NOT DETECTED Final   Pseudomonas aeruginosa NOT DETECTED NOT DETECTED Final   Candida albicans DETECTED (A) NOT DETECTED Final    Comment: CRITICAL RESULT CALLED TO, READ BACK BY AND VERIFIED WITH: JASON ROBBINS 02/18/16 @ 1922  MLK    Candida glabrata NOT DETECTED NOT DETECTED Final   Candida krusei NOT DETECTED NOT DETECTED Final   Candida parapsilosis NOT DETECTED NOT DETECTED Final   Candida tropicalis NOT DETECTED NOT DETECTED Final  Culture, blood (routine x 2)     Status: None (Preliminary result)   Collection Time: 02/18/16  5:26 AM  Result Value Ref Range Status   Specimen Description  BLOOD LEFT FOREARM  Final   Special Requests   Final    BOTTLES DRAWN AEROBIC AND ANAEROBIC  ANA 6ML AER 6ML   Culture NO GROWTH 1 DAY  Final   Report Status PENDING  Incomplete    IMAGING: Dg Chest Portable 1 View  Result Date: 02/18/2016 CLINICAL DATA:  Shortness of breath and wheezing tonight. Diagnosed with pneumonia and urinary tract infection at [facility] yesterday. EXAM: PORTABLE CHEST 1 VIEW COMPARISON:  10/29/2015 FINDINGS: Normal heart size and pulmonary vascularity. Emphysematous changes and chronic bronchitic changes in the lungs. Increasing infiltration in the lung bases may represent acute pneumonia. Probable small bilateral pleural effusions. No pneumothorax. Calcified and tortuous aorta. IMPRESSION: Emphysematous and chronic bronchitic changes in the lungs. Increasing infiltrates in the lung bases with small bilateral pleural effusions suggesting pneumonia. Electronically Signed   By: Lucienne Capers M.D.   On: 02/18/2016 05:37   Ciro Tashiro [facility]  ECHO COMPLETE WO IMAGE ENHANCING AGENT  Order# [id number]  Reading physician: Yolonda Kida, MD Ordering physician: Fritzi Mandes, MD Study date: 02/18/16  Study Result   Result status: Final result                   *[place], [place] [address]                            [phone]  -------------------------------------------------------------------  Transthoracic Echocardiography  Patient:    Fortune, Brannigan MR #:       [id number] Study Date: 02/18/2016 Gender:     M Age:        89 Height:     167.6 cm Weight:     59 kg BSA:        1.66 m^2 Pt. Status: Room:       117A   ADMITTING    Posey Pronto, Sona A  ATTENDING    Posey Pronto, Sona A  Laverda Page, Sona A  REFERRING    Fritzi Mandes A  SONOGRAPHER  Sherrie Sport RDCS  PERFORMING   Jefm Bryant,  Clinic  cc:  ------------------------------------------------------------------- LV EF: 40% -   45%  ------------------------------------------------------------------- Indications:      Elevated Troponin.  ------------------------------------------------------------------- History:   PMH:  PTSD. Mixed HLD, anxiety.  Coronary artery disease.  Chronic obstructive pulmonary disease.  ------------------------------------------------------------------- Study Conclusions  - Procedure narrative: Transthoracic echocardiography. The study   was technically difficult. - Left ventricle: The cavity size was mildly dilated. Systolic   function was mildly to moderately reduced. The estimated ejection   fraction was in the range of 40% to 45%. Wall motion was normal;   there were no regional wall motion abnormalities. - Aortic valve: Valve area (VTI): 3.6 cm^2. Valve area (Vmax): 3.14   cm^2. Valve area (Vmean): 3.13 cm^2. - Right ventricle: The cavity size was mildly dilated. Wall   thickness was normal. - Right atrium: The atrium was mildly dilated. - Pericardium, extracardiac: A trivial pericardial effusion was   identified.  Impressions:  - No cardiac source of embolism was identified, but cannot be ruled   out on the basis of this examination.     Assessment:   Daniel Dickson is a 82 y.o. male admitted with cough and sob and found to have bibasilar PNA and then bcx + candida and enterococcus by [facility] in 1/2.  He has a chronic foley but it is unclear what was done during his recent [facility] admission as cannot access the records from [facility].   It is unclear to me if the mixed blood culture is just contaminant or could be related to his recent admission or his chronic foley.  He also has a concerning 30# wt loss per report and great difficulty swallowing.  TTE was neg but difficult study  Recommendations Obtain [facility] records to review culture and imaging data.  For the  enterococcus start coverage with meropenem (he is pcn allergic).  This should also cover aspiration PNA Start fluconazole Repeat bcx Swallow eval  Thank you very much for allowing me to participate in the care of this patient. Please call with questions.   Cheral Marker. Ola Spurr, MD

## 2016-02-19 NOTE — Evaluation (Signed)
Physical Therapy Evaluation Patient Details Name: Daniel Dickson MRN: [id number] DOB: 10/25/1912 Today's Date: 02/19/2016   History of Present Illness  Pt is a [age] y.o. male presenting to hospital with increased SOB, hypoxia, and elevated troponins (now downtrending).  [facility] and dx with PNA and UTI and sent home.  Pt admitted to hospital with R LL PNA with acute respiratory failure.  PMH includes anxiety, COPD, CAD, htn, PTSD, hip fx s/p sx, MI PCI and stent of RCA 2012.  Clinical Impression  Prior to hospital admission, pt reports he was modified independent ambulating with RW in home.  Pt lives with his son (who has Down Syndrome) in 1 level home with ramp to enter; daughter lives next door and frequently checks in.  Currently pt is supervision with supine to sit and min assist with transfers and ambulation bed to chair with RW (assist required d/t mild posterior lean in standing/walking requiring assist to steady and shift weight forward).  Pt would benefit from skilled PT to address noted impairments and functional limitations.  Recommend pt discharge to STR (pending progress with functional mobility) when medically appropriate.    Follow Up Recommendations SNF (pending pt's progress)    Equipment Recommendations   (pt already owns RW)    Recommendations for Other Services       Precautions / Restrictions Precautions Precautions: Fall Precaution Comments: Chronic foley Restrictions Weight Bearing Restrictions: No      Mobility  Bed Mobility Overal bed mobility: Needs Assistance Bed Mobility: Supine to Sit     Supine to sit: Supervision;HOB elevated     General bed mobility comments: increased effort to perform  Transfers Overall transfer level: Needs assistance Equipment used: Rolling walker (2 wheeled) Transfers: Sit to/from Stand Sit to Stand: Min assist         General transfer comment: assist for balance upon standing d/t mild posterior  lean  Ambulation/Gait Ambulation/Gait assistance: Min assist Ambulation Distance (Feet): 3 Feet (bed to recliner) Assistive device: Rolling walker (2 wheeled)   Gait velocity: decreased   General Gait Details: decreased B step length/foot clearance/heelstrike; pt with mild posterior lean requiring assist for balance and to shift weight forward  Stairs            Wheelchair Mobility    Modified Rankin (Stroke Patients Only)       Balance Overall balance assessment: Needs assistance;History of Falls Sitting-balance support: Bilateral upper extremity supported;Feet supported Sitting balance-Leahy Scale: Fair     Standing balance support: Bilateral upper extremity supported (on RW) Standing balance-Leahy Scale: Poor Standing balance comment: mild posterior lean requiring assist to correct                             Pertinent Vitals/Pain Pain Assessment: No/denies pain  HR 105-107 bpm, O2 >92% on RA, and BP 96/58-99/62 during session.    Home Living Family/patient expects to be discharged to:: Private residence Living Arrangements: Children Available Help at Discharge: Family Type of Home: House Home Access: Ramped entrance     Home Layout: One level Home Equipment: Environmental consultantWalker - 2 wheels;Walker - 4 wheels;Grab bars - toilet;Wheelchair - manual Additional Comments: Chronic foley.      Prior Function Level of Independence: Independent with assistive device(s)         Comments: Pt modified independent with limited household mobility with RW.  Lives with son with Down Syndrome.  Daughter lives next door (  frequently checks in and makes supper every night).  Pt denies and recent "bad" falls.  Has aide M-F for 2 hours.     Hand Dominance   Dominant Hand: Right    Extremity/Trunk Assessment   Upper Extremity Assessment: Generalized weakness           Lower Extremity Assessment: Generalized weakness         Communication   Communication: HOH   Cognition Arousal/Alertness: Awake/alert Behavior During Therapy: WFL for tasks assessed/performed Overall Cognitive Status: Within Functional Limits for tasks assessed                      General Comments General comments (skin integrity, edema, etc.): Pt laying in bed upon PT arrival.  Nursing cleared pt for participation in physical therapy.  Pt agreeable to PT session.    Exercises Total Joint Exercises Ankle Circles/Pumps: AROM;Strengthening;Both;10 reps;Supine Quad Sets: AROM;Strengthening;Both;10 reps;Supine Short Arc Quad: AROM;Strengthening;Both;10 reps;Supine Heel Slides: AAROM;Strengthening;Both;10 reps;Supine Hip ABduction/ADduction: AAROM;Strengthening;Both;10 reps;Supine  Vc's required for technique.   Assessment/Plan    PT Assessment Patient needs continued PT services  PT Problem List Decreased strength;Decreased activity tolerance;Decreased balance;Decreased mobility          PT Treatment Interventions DME instruction;Gait training;Functional mobility training;Therapeutic activities;Therapeutic exercise;Balance training;Patient/family education    PT Goals (Current goals can be found in the Care Plan section)  Acute Rehab PT Goals Patient Stated Goal: to get moving PT Goal Formulation: With patient Time For Goal Achievement: 03/04/16 Potential to Achieve Goals: Good    Frequency Min 2X/week   Barriers to discharge Other (comment) Question level of assist available    Co-evaluation               End of Session Equipment Utilized During Treatment: Gait belt Activity Tolerance: Patient tolerated treatment well Patient left: in chair;with call bell/phone within reach;with chair alarm set Nurse Communication: Mobility status;Precautions         Time: 1000-1027 PT Time Calculation (min) (ACUTE ONLY): 27 min   Charges:   PT Evaluation $PT Eval Low Complexity: 1 Procedure PT Treatments $Therapeutic Exercise: 8-22 mins   PT G  CodesHendricks Dickson:        Daniel Dickson 02/19/2016, 11:47 AM Daniel LimesEmily Guido Dickson, PT [phone]

## 2016-02-19 NOTE — Clinical Social Work Placement (Addendum)
   CLINICAL SOCIAL WORK PLACEMENT  NOTE  Date:  02/19/2016  Patient Details  Name: Daniel Dickson MRN: [id number] Date of Birth: 06/06/1912  Clinical Social Work is seeking post-discharge placement for this patient at the Skilled  Nursing Facility level of care (*CSW will initial, date and re-position this form in  chart as items are completed):  Yes   Patient/family provided with [facility] Clinical Social Work Department's list of facilities offering this level of care within the geographic area requested by the patient (or if unable, by the patient's family).  Yes   Patient/family informed of their freedom to choose among providers that offer the needed level of care, that participate in Medicare, Medicaid or managed care program needed by the patient, have an available bed and are willing to accept the patient.  Yes   Patient/family informed of [facility]'s ownership interest in [facility] and [facility], as well as of the fact that they are under no obligation to receive care at these facilities.  PASRR submitted to EDS on       PASRR number received on       Existing PASRR number confirmed on 02/19/16     FL2 transmitted to all facilities in geographic area requested by pt/family on 02/19/16     FL2 transmitted to all facilities within larger geographic area on       Patient informed that his/her managed care company has contracts with or will negotiate with certain facilities, including the following:        Yes   Patient/family informed of bed offers received.  Patient chooses bed at  ([facility] )     Physician recommends and patient chooses bed at      [facility] Patient to be transferred to   on  .  02/22/2016   Patient to be transferred to facility by     EMS   Patient family notified on   of transfer.  Daughter  Name of family member notified:        PHYSICIAN       Additional Comment:    _______________________________________________ Sample,  Darleen CrockerBailey M, LCSW 02/19/2016, 4:17 PM

## 2016-02-19 NOTE — Progress Notes (Signed)
Speech Therapy Note: NSG contacted this SLP re: pt's diet consistency - MD had wanted a chopped/soft foods diet but it was ordered as a "soft" diet(GI). After this conversation w/ the Nurse, diet consistency was changed to Dysphagia level 3(mech soft) w/thin liquids; NSG requested a communication in the chart for meds to be given in Puree for easier swallowing to this and general aspiration precautions were entered into the chart.  ST services are available for anything further while pt is admitted. NSG agreed.

## 2016-02-19 NOTE — NC FL2 (Signed)
[place] MEDICAID FL2 LEVEL OF CARE SCREENING TOOL     IDENTIFICATION  Patient Name: Daniel Dickson Birthdate: 07/20/1912 Sex: male Admission Date (Current Location): 02/18/2016  [place]   Facility and Address:  [facility], [address], [place], [place] [address]      Provider Number: [id number]  Attending Physician Name and Address:  Enedina FinnerSona Patel, MD  Relative Name and Phone Number:       Current Level of Care: Hospital Recommended Level of Care: Skilled Nursing Facility Prior Approval Number:    Date Approved/Denied:   PASRR Number:  ( [id number] )  Discharge Plan: SNF    Current Diagnoses: Patient Active Problem List   Diagnosis Date Noted  . Acute respiratory failure (HCC) 02/18/2016  . Urinary retention 11/10/2015  . Hyperkalemia 11/02/2015  . Hypotension 11/02/2015  . Dysphagia, pharyngoesophageal phase 11/02/2015  . Esophageal spasm 11/02/2015  . Constipation 11/02/2015  . SIADH (syndrome of inappropriate ADH production) (HCC) 11/02/2015  . Urinary retention due to benign prostatic hyperplasia 11/02/2015  . Chronic indwelling Foley catheter 11/02/2015  . Bacteriuria with pyuria 11/02/2015  . Protein-calorie malnutrition, severe 10/30/2015  . ARF (acute renal failure) (HCC) 10/29/2015  . Dyspnea 10/07/2015  . Hyponatremia 06/06/2015  . Coronary artery disease   . Hypertensive heart disease   . Mixed hyperlipidemia   . COPD (chronic obstructive pulmonary disease) (HCC)   . COPD exacerbation (HCC) 01/11/2015  . Moderate COPD (chronic obstructive pulmonary disease) (HCC) 12/08/2014  . Fall 09/03/2014  . Hip fracture (HCC) 09/03/2014  . PTSD (post-traumatic stress disorder) 09/03/2014  . Bilateral leg edema 09/03/2014  . SOB (shortness of breath) 10/31/2012  . Essential hypertension 10/31/2012  . CAD (coronary artery disease) 10/31/2012  . Hyperlipidemia 10/31/2012  . Tachycardia 10/31/2012     Orientation RESPIRATION BLADDER Height & Weight     Self, Time, Situation, Place  Normal Continent Weight: 130 lb (59 kg) Height:  5\' 6"  (167.6 cm)  BEHAVIORAL SYMPTOMS/MOOD NEUROLOGICAL BOWEL NUTRITION STATUS   (none)  (none) Continent Diet (Diet: DYS 3 )  AMBULATORY STATUS COMMUNICATION OF NEEDS Skin   Extensive Assist Verbally Normal                       Personal Care Assistance Level of Assistance  Bathing, Feeding, Dressing Bathing Assistance: Limited assistance Feeding assistance: Independent Dressing Assistance: Limited assistance     Functional Limitations Info  Sight, Hearing, Speech Sight Info: Adequate Hearing Info: Adequate Speech Info: Adequate    SPECIAL CARE FACTORS FREQUENCY  PT (By licensed PT), OT (By licensed OT)     PT Frequency:  (5) OT Frequency:  (5)            Contractures      Additional Factors Info  Code Status, Allergies Code Status Info:  (Full Code. ) Allergies Info:  (Cortisone, Dye Fdc Red Red Dye, Iodine (Kelp)  Iodine, Peanut Oil, Peanut-containing Drug Products, Penicillins)           Current Medications (02/19/2016):  This is the current hospital active medication list Current Facility-Administered Medications  Medication Dose Route Frequency Provider Last Rate Last Dose  . acetaminophen (TYLENOL) tablet 650 mg  650 mg Oral Q6H PRN Enedina FinnerSona Patel, MD   650 mg at 02/19/16 1215   Or  . acetaminophen (TYLENOL) suppository 650 mg  650 mg Rectal Q6H PRN Enedina FinnerSona Patel, MD      . albuterol (PROVENTIL) (  2.5 MG/3ML) 0.083% nebulizer solution 2.5 mg  2.5 mg Nebulization Q6H Enedina FinnerSona Patel, MD   2.5 mg at 02/19/16 0743  . albuterol (PROVENTIL) (2.5 MG/3ML) 0.083% nebulizer solution 2.5 mg  2.5 mg Nebulization Q8H PRN Enedina FinnerSona Patel, MD      . aspirin EC tablet 81 mg  81 mg Oral Daily Enedina FinnerSona Patel, MD   81 mg at 02/19/16 0935  . benzonatate (TESSALON) capsule 200 mg  200 mg Oral TID PRN Enedina FinnerSona Patel, MD      . cholecalciferol (VITAMIN D) tablet  1,000 Units  1,000 Units Oral Daily Enedina FinnerSona Patel, MD   1,000 Units at 02/19/16 0935  . enoxaparin (LOVENOX) injection 60 mg  1 mg/kg Subcutaneous Q24H Enedina FinnerSona Patel, MD   60 mg at 02/18/16 1746  . feeding supplement (ENSURE ENLIVE) (ENSURE ENLIVE) liquid 237 mL  237 mL Oral TID WC Enedina FinnerSona Patel, MD   237 mL at 02/19/16 1200  . finasteride (PROSCAR) tablet 5 mg  5 mg Oral Daily Enedina FinnerSona Patel, MD   5 mg at 02/19/16 0934  . fluticasone furoate-vilanterol (BREO ELLIPTA) 100-25 MCG/INH 1 puff  1 puff Inhalation Daily Enedina FinnerSona Patel, MD   1 puff at 02/19/16 0957  . isosorbide mononitrate (IMDUR) 24 hr tablet 30 mg  30 mg Oral Daily Enedina FinnerSona Patel, MD   30 mg at 02/18/16 1011  . loratadine (CLARITIN) tablet 10 mg  10 mg Oral Daily Enedina FinnerSona Patel, MD   10 mg at 02/19/16 0934  . magnesium hydroxide (MILK OF MAGNESIA) suspension 30 mL  30 mL Oral BID PRN Enedina FinnerSona Patel, MD      . Melatonin TABS 10 mg  10 mg Oral QHS Enedina FinnerSona Patel, MD      . metoprolol tartrate (LOPRESSOR) tablet 12.5 mg  12.5 mg Oral BID Enedina FinnerSona Patel, MD   12.5 mg at 02/18/16 1743  . nitroGLYCERIN (NITROSTAT) SL tablet 0.4 mg  0.4 mg Sublingual Q5 min PRN Enedina FinnerSona Patel, MD      . ondansetron (ZOFRAN) tablet 4 mg  4 mg Oral Q6H PRN Enedina FinnerSona Patel, MD       Or  . ondansetron (ZOFRAN) injection 4 mg  4 mg Intravenous Q6H PRN Enedina FinnerSona Patel, MD      . pantoprazole (PROTONIX) EC tablet 40 mg  40 mg Oral Daily Enedina FinnerSona Patel, MD   40 mg at 02/19/16 0935  . pravastatin (PRAVACHOL) tablet 80 mg  80 mg Oral Daily Enedina FinnerSona Patel, MD   80 mg at 02/19/16 0935  . senna-docusate (Senokot-S) tablet 1 tablet  1 tablet Oral QHS PRN Enedina FinnerSona Patel, MD      . tamsulosin (FLOMAX) capsule 0.4 mg  0.4 mg Oral Daily Enedina FinnerSona Patel, MD   0.4 mg at 02/19/16 0935  . tiotropium (SPIRIVA) inhalation capsule 18 mcg  18 mcg Inhalation QPM Enedina FinnerSona Patel, MD   18 mcg at 02/18/16 1745  . traMADol (ULTRAM) tablet 50 mg  50 mg Oral Q8H PRN Enedina FinnerSona Patel, MD   50 mg at 02/19/16 [id number]     Discharge Medications: Please see discharge  summary for a list of discharge medications.  Relevant Imaging Results:  Relevant Lab Results:   Additional Information  (SSN: [id number])  Shaunak Kreis, Darleen CrockerBailey M, LCSW

## 2016-02-19 NOTE — Progress Notes (Signed)
While rounding, CH made initial visit to room 117.  Pt was sitting in the chair beside the bed. Pt presented himself as tired... "On and off again". When asked which he felt more of, he responded "Mostly off" with a grin. Pt was appreciative of the visit, but did not indicate a need for spiritual care at this time. CH is available for follow up as needed.    02/19/16 1300  Clinical Encounter Type  Visited With Patient  Visit Type Initial;Spiritual support  Referral From Nurse  Spiritual Encounters  Spiritual Needs Emotional  Stress Factors  Patient Stress Factors Exhausted;Health changes

## 2016-02-19 NOTE — Clinical Social Work Note (Signed)
Clinical Social Work Assessment  Patient Details  Name: Daniel Dickson MRN: [id number] Date of Birth: [birth year]  Date of referral:  02/19/16               Reason for consult:  Facility Placement                Permission sought to share information with:  Chartered certified accountant granted to share information::  Yes, Verbal Permission Granted  Name::      [place]::   [facility]   Relationship::     Contact Information:     Housing/Transportation Living arrangements for the past 2 months:  [place] of Information:  Patient, Adult Children Patient Interpreter Needed:  None Criminal Activity/Legal Involvement Pertinent to Current Situation/Hospitalization:  No - Comment as needed Significant Relationships:  Adult Children Lives with:  Adult Children Do you feel safe going back to the place where you live?  Yes Need for family participation in patient care:  Yes (Comment)  Care giving concerns:  Patient lives in [place] with his son who has down syndrome.    Social Worker assessment / plan:  Holiday representative (CSW) received verbal consult from MD that patient will need SNF and he will be ready for D/C over the weekend. PT is recommending SNF. CSW met with patient alone at bedside to discuss D/C plan. Patient was alert and oriented and sitting up in the bed. Patient reported that he lives in [place] with his son and his daughter Daniel Dickson lives near by. Patient is agreeable to SNF search and asked CSW to call his daughter about a preference. CSW contacted patient's daughter who prefers [facility]. FL2 complete and faxed out. [employer] authorization was stated and clinicals were faxed to [facility].   [employer] authorization has been received. Auth # [id number] RVB, next review date 11/13. [facility] can accept patient tomorrow. Daughter and patient are in agreement with D/C plan. MD aware of above. Per MD patient will be on PO antibiotics.     Employment status:  Retired Nurse, adult PT Recommendations:  [place] / Referral to community resources:  [place]  Patient/Family's Response to care:  Patient and daughter are agreeable to patient going to [facility].   Patient/Family's Understanding of and Emotional Response to Diagnosis, Current Treatment, and Prognosis:  Patient and daughter were very pleasant and thanked CSW for assistance.   Emotional Assessment Appearance:  Appears stated age Attitude/Demeanor/Rapport:    Affect (typically observed):  Accepting, Adaptable, Pleasant Orientation:  Oriented to Self, Oriented to Place, Oriented to  Time, Oriented to Situation Alcohol / Substance use:  Not Applicable Psych involvement (Current and /or in the community):  No (Comment)  Discharge Needs  Concerns to be addressed:  Discharge Planning Concerns Readmission within the last 30 days:  No Current discharge risk:  Dependent with Mobility Barriers to Discharge:  Continued Medical Work up   [employer], Veronia Beets, LCSW 02/19/2016, 4:18 PM

## 2016-02-19 NOTE — Progress Notes (Addendum)
[facility] [place] [facility]   PATIENT NAME: Daniel Dickson    MR#:  [id number]  DATE OF BIRTH:  04/14/1912  SUBJECTIVE:  Feels better after having BM  REVIEW OF SYSTEMS:   Review of Systems  Constitutional: Negative for chills, fever and weight loss.  HENT: Negative for ear discharge, ear pain and nosebleeds.   Eyes: Negative for blurred vision, pain and discharge.  Respiratory: Negative for sputum production, shortness of breath, wheezing and stridor.   Cardiovascular: Negative for chest pain, palpitations, orthopnea and PND.  Gastrointestinal: Positive for constipation. Negative for abdominal pain, diarrhea, nausea and vomiting.  Genitourinary: Negative for frequency and urgency.  Musculoskeletal: Negative for back pain and joint pain.  Neurological: Positive for weakness. Negative for sensory change, speech change and focal weakness.  Psychiatric/Behavioral: Negative for depression and hallucinations. The patient is not nervous/anxious.    Tolerating Diet:yes Tolerating PT: rehab  DRUG ALLERGIES:   Allergies  Allergen Reactions  . Cortisone Other (See Comments)    Other reaction(s): Other (See Comments) GI Upset Unknown reaction  . Dye Fdc Red [Red Dye] Other (See Comments)    Unknown reaction.  . Iodine (Kelp)  [Iodine]     Other reaction(s): Other (See Comments) GI Upset  . Peanut Oil     Other reaction(s): Unknown  . Peanut-Containing Drug Products     Other reaction(s): Unknown  . Penicillins Rash    GI Upset patient not sure of reaction    VITALS:  Blood pressure (!) 91/56, pulse (!) 103, temperature 97.7 F (36.5 C), temperature source Oral, resp. rate 19, height 5\' 6"  (1.676 m), weight 59 kg (130 lb), SpO2 98 %.  PHYSICAL EXAMINATION:   Physical Exam  GENERAL:  [age] y.o.-year-old patient lying in the bed with no acute distress.  EYES: Pupils equal, round, reactive to light and accommodation. No scleral icterus.  Extraocular muscles intact.  HEENT: Head atraumatic, normocephalic. Oropharynx and nasopharynx clear.  NECK:  Supple, no jugular venous distention. No thyroid enlargement, no tenderness.  LUNGS: Normal breath sounds bilaterally, no wheezing, rales, rhonchi. No use of accessory muscles of respiration.  CARDIOVASCULAR: S1, S2 normal. No murmurs, rubs, or gallops.  ABDOMEN: Soft, nontender, nondistended. Bowel sounds present. No organomegaly or mass.  EXTREMITIES: No cyanosis, clubbing or edema b/l.    NEUROLOGIC: Cranial nerves II through XII are intact. No focal Motor or sensory deficits b/l.   PSYCHIATRIC:  patient is alert and oriented x 3.  SKIN: No obvious rash, lesion, or ulcer.   LABORATORY PANEL:  CBC  Recent Labs Lab 02/19/16 0733  WBC 14.7*  HGB 13.8  HCT 41.1  PLT 369    Chemistries   Recent Labs Lab 02/18/16 0526  NA 131*  K 4.3  CL 96*  CO2 26  GLUCOSE 113*  BUN 22*  CREATININE 1.12  CALCIUM 10.1  AST 50*  ALT 36  ALKPHOS 96  BILITOT 1.0   Cardiac Enzymes  Recent Labs Lab 02/19/16 0733  TROPONINI 1.36*   RADIOLOGY:  Dg Chest Portable 1 View  Result Date: 02/18/2016 CLINICAL DATA:  Shortness of breath and wheezing tonight. Diagnosed with pneumonia and urinary tract [facility] yesterday. EXAM: PORTABLE CHEST 1 VIEW COMPARISON:  10/29/2015 FINDINGS: Normal heart size and pulmonary vascularity. Emphysematous changes and chronic bronchitic changes in the lungs. Increasing infiltration in the lung bases may represent acute pneumonia. Probable small bilateral pleural effusions. No pneumothorax. Calcified and tortuous aorta. IMPRESSION: Emphysematous  and chronic bronchitic changes in the lungs. Increasing infiltrates in the lung bases with small bilateral pleural effusions suggesting pneumonia. Electronically Signed   By: Burman NievesWilliam  Stevens M.D.   On: 02/18/2016 05:37   ASSESSMENT AND PLAN:  Kem ParkinsonGeorge Whiteis a [age] y.o. malewith a known history  of COPD, CAD, hyperlipidemia comes to the emergency room with increasing shortness of breath for last 2 days. Patient [facility] room diagnosed with pneumonia and UTI discharged to home yesterday.  1. Acute respiratory failure secondary to right lower lobe pneumonia. Patient came in with increasing shortness of breath fatigue elevated Keeling count and chest distress suggestive off right lower lobe pneumonia. -IV Levaquin -Incentive spirometer -When necessary nebs -Wean oxygen as able to - blood cultures + Enterrococci and candida -ID to see pt  2. Leukocytosis secondary to pneumonia  3. History of COPD -Patient appears not wheezing at present I'll hold off on steroids. Continue nebulizer as needed  4.NSTEMI vs  elevated troponin appears to demand ischemia in the setting of pneumonia  Troponin 0.89--2.73--2.63--1.63 - cardiology consultation.d/w dr Juliann [place] Echo EF 45-50% -Lovenox 1 mg/kg bid, metoprolol -vitals stblae. Pt denies cp -Aspirin, nitroglycerin when necessary,Imdur, continue statins  5. Chronic Foley catheter for last 7 months likely due to BPH  6. DVT prophylaxis Lovenox  Case discussed with Care Management/Social Worker. Management plans discussed with the patient, family and they are in agreement.  CODE STATUS: FULL  DVT Prophylaxis:Lovenox TOTAL TIME TAKING CARE OF THIS PATIENT: 30 minutes.  >50% time spent on counselling and coordination of care pt and dter on phoine  POSSIBLE D/C IN 1-2DAYS, DEPENDING ON CLINICAL CONDITION.  Note: This dictation was prepared with Dragon dictation along with smaller phrase technology. Any transcriptional errors that result from this process are unintentional.  Tiesha Marich M.D on 02/19/2016 at 2:01 PM  Between 7am to 6pm - Pager - [phone]  After 6pm go to [email or web address] - password [email or web address] [facility]  [facility]  Office  [phone]  CC: Primary care physician; Dortha KernBLISS, LAURA K,  MD

## 2016-02-20 LAB — CBC
HEMATOCRIT: 39.5 % — AB (ref 40.0–52.0)
Hemoglobin: 13.3 g/dL (ref 13.0–18.0)
MCH: 31.3 pg (ref 26.0–34.0)
MCHC: 33.6 g/dL (ref 32.0–36.0)
MCV: 93.1 fL (ref 80.0–100.0)
Platelets: 369 10*3/uL (ref 150–440)
RBC: 4.24 MIL/uL — ABNORMAL LOW (ref 4.40–5.90)
RDW: 15.9 % — AB (ref 11.5–14.5)
WBC: 18 10*3/uL — AB (ref 3.8–10.6)

## 2016-02-20 LAB — CREATININE, SERUM
CREATININE: 1.29 mg/dL — AB (ref 0.61–1.24)
GFR calc Af Amer: 50 mL/min — ABNORMAL LOW (ref >60)
GFR calc non Af Amer: 43 mL/min — ABNORMAL LOW (ref >60)

## 2016-02-20 LAB — C DIFFICILE QUICK SCREEN W PCR REFLEX
C DIFFICILE (CDIFF) INTERP: NOT DETECTED
C Diff antigen: NEGATIVE
C Diff toxin: NEGATIVE

## 2016-02-20 NOTE — Plan of Care (Signed)
Problem: Education: Goal: Knowledge of [place] [facility] Education information/materials will improve Outcome: Progressing VS WDL, free of falls during shift.  Denies pain, nausea.  Requested sleep aid, received scheduled melatonin 10mg .  Pt asleep majority of shift post-melatonin.  Pt requested RN to call daughter, daughter subsequently called unit, RN provided update.  Pt informed daughter will return to unit in AM.  Pt compliant with q2h turning for skin integrity.  Amber urine draining from foley catheter, no issues.  Bed in low position, bed alarm on.  Call bell within reach, WCTM.

## 2016-02-20 NOTE — Plan of Care (Signed)
Problem: Activity: Goal: Risk for activity intolerance will decrease Outcome: Progressing Up to the chair today.  Problem: Physical Regulation: Goal: Ability to maintain clinical measurements within normal limits will improve Outcome: Progressing In am SBP in the 90's. Metoprolol held. At 1400 BP 82/44, asymptomatic. Dr Allena KatzPatel notified. No new order. Continue to monitor.

## 2016-02-20 NOTE — Progress Notes (Signed)
[facility] [place] [facility]   PATIENT NAME: Daniel BussingGeorge Dickson    MR#:  [id number]  DATE OF BIRTH:  12/03/1912  SUBJECTIVE:  Appears weak. No fever, slept good  REVIEW OF SYSTEMS:   Review of Systems  Constitutional: Negative for chills, fever and weight loss.  HENT: Negative for ear discharge, ear pain and nosebleeds.   Eyes: Negative for blurred vision, pain and discharge.  Respiratory: Negative for sputum production, shortness of breath, wheezing and stridor.   Cardiovascular: Negative for chest pain, palpitations, orthopnea and PND.  Gastrointestinal: Negative for abdominal pain, diarrhea, nausea and vomiting.  Genitourinary: Negative for frequency and urgency.  Musculoskeletal: Negative for back pain and joint pain.  Neurological: Positive for weakness. Negative for sensory change, speech change and focal weakness.  Psychiatric/Behavioral: Negative for depression and hallucinations. The patient is not nervous/anxious.    Tolerating Diet:yes Tolerating PT: rehab  DRUG ALLERGIES:   Allergies  Allergen Reactions  . Cortisone Other (See Comments)    Other reaction(s): Other (See Comments) GI Upset Unknown reaction  . Dye Fdc Red [Red Dye] Other (See Comments)    Unknown reaction.  . Iodine (Kelp)  [Iodine]     Other reaction(s): Other (See Comments) GI Upset  . Peanut Oil     Other reaction(s): Unknown  . Peanut-Containing Drug Products     Other reaction(s): Unknown  . Penicillins Rash    GI Upset patient not sure of reaction    VITALS:  Blood pressure 100/62, pulse 82, temperature 98.2 F (36.8 C), temperature source Oral, resp. rate 18, height 5\' 6"  (1.676 m), weight 59 kg (130 lb), SpO2 93 %.  PHYSICAL EXAMINATION:   Physical Exam  GENERAL:  [age] y.o.-year-old patient lying in the bed with no acute distress.  EYES: Pupils equal, round, reactive to light and accommodation. No scleral icterus. Extraocular muscles intact.  HEENT:  Head atraumatic, normocephalic. Oropharynx and nasopharynx clear.  NECK:  Supple, no jugular venous distention. No thyroid enlargement, no tenderness.  LUNGS: Normal breath sounds bilaterally, no wheezing, rales, rhonchi. No use of accessory muscles of respiration.  CARDIOVASCULAR: S1, S2 normal. No murmurs, rubs, or gallops.  ABDOMEN: Soft, nontender, nondistended. Bowel sounds present. No organomegaly or mass. Chronic foley EXTREMITIES: No cyanosis, clubbing or edema b/l.   Skin bruises NEUROLOGIC: Cranial nerves II through XII are intact. No focal Motor or sensory deficits b/l.   PSYCHIATRIC:  patient is alert and oriented x 3.  SKIN: No obvious rash, lesion, or ulcer.   LABORATORY PANEL:  CBC  Recent Labs Lab 02/20/16 0536  WBC 18.0*  HGB 13.3  HCT 39.5*  PLT 369    Chemistries   Recent Labs Lab 02/18/16 0526 02/20/16 0536  NA 131*  --   K 4.3  --   CL 96*  --   CO2 26  --   GLUCOSE 113*  --   BUN 22*  --   CREATININE 1.12 1.29*  CALCIUM 10.1  --   AST 50*  --   ALT 36  --   ALKPHOS 96  --   BILITOT 1.0  --    Cardiac Enzymes  Recent Labs Lab 02/19/16 0733  TROPONINI 1.36*   RADIOLOGY:  No results found. ASSESSMENT AND PLAN:  Daniel ParkinsonGeorge Whiteis a [age] y.o. malewith a known history of COPD, CAD, hyperlipidemia comes to the emergency room with increasing shortness of breath for last 2 days. Patient was [facility] emergency room diagnosed  with pneumonia and UTI discharged to home yesterday.  1. Acute respiratory failure secondary to right lower lobe pneumonia. Patient came in with increasing shortness of breath fatigue elevated Gallacher count and chest distress suggestive off right lower lobe pneumonia. -IV Levaquin -Incentive spirometer -When necessary nebs -Wean oxygen as able to - blood cultures + Enterrococci and candida -ID to see pt  2. Sepsis enterococci and candida (pt has chronic foley catheter) -positive BC (biofire 02/18/16) -BC  pending from 11/10 -on IV meropenem and IV fluconzole  3. History of COPD -Patient appears not wheezing at present I'll hold off on steroids. Continue nebulizer as needed  4.NSTEMI vs  elevated troponin appears to demand ischemia in the setting of pneumonia  Troponin 0.89--2.73--2.63--1.63 - cardiology consultation.d/w dr Juliann [place] Echo EF 45-50% -Lovenox 1 mg/kg bid (48 hours), metoprolol -vitals stable. Pt denies cp -Aspirin, nitroglycerin when necessary,Imdur, continue statins  5. Chronic Foley catheter for last 7 months likely due to BPH  6. DVT prophylaxis Lovenox  Case discussed with Care Management/Social Worker. Management plans discussed with the patient, family and they are in agreement.  CODE STATUS: FULL  DVT Prophylaxis:Lovenox TOTAL TIME TAKING CARE OF THIS PATIENT: 30 minutes.  >50% time spent on counselling and coordination of care pt and dter on phoine  POSSIBLE D/C IN 1-2DAYS, DEPENDING ON CLINICAL CONDITION.  Note: This dictation was prepared with Dragon dictation along with smaller phrase technology. Any transcriptional errors that result from this process are unintentional.  Nuriya Stuck M.D on 02/20/2016 at 7:54 AM  Between 7am to 6pm - Pager - [phone]  After 6pm go to [email or web address] - password [email or web address] [facility]  [facility]  Office  [phone]  CC: Primary care physician; Dortha KernBLISS, LAURA K, MD

## 2016-02-21 LAB — CREATININE, SERUM
CREATININE: 1.22 mg/dL (ref 0.61–1.24)
GFR calc Af Amer: 54 mL/min — ABNORMAL LOW (ref >60)
GFR, EST NON AFRICAN AMERICAN: 46 mL/min — AB (ref >60)

## 2016-02-21 LAB — CULTURE, BLOOD (ROUTINE X 2)

## 2016-02-21 MED ORDER — FLUCONAZOLE 100 MG PO TABS
200.0000 mg | ORAL_TABLET | Freq: Every day | ORAL | Status: DC
  Administered 2016-02-21 – 2016-02-22 (×2): 200 mg via ORAL
  Filled 2016-02-21 (×2): qty 2

## 2016-02-21 MED ORDER — ENOXAPARIN SODIUM 30 MG/0.3ML ~~LOC~~ SOLN
30.0000 mg | SUBCUTANEOUS | Status: DC
  Administered 2016-02-21 – 2016-02-22 (×2): 30 mg via SUBCUTANEOUS
  Filled 2016-02-21 (×2): qty 0.3

## 2016-02-21 NOTE — Progress Notes (Signed)
[age] y/o M ordered Lovenox 40 mg daily. With CrCl < 30 ml/min and BMI < 40, will change Lovnenox dose to 30 mg daily.   Luisa HartScott Mark Benecke, PharmD Clinical Pharmacist

## 2016-02-21 NOTE — Progress Notes (Signed)
[facility] [place] [facility]   PATIENT NAME: Daniel BussingGeorge Dickson    MR#:  [id number]  DATE OF BIRTH:  06/23/1912  SUBJECTIVE:  Appears weak. No fever, slept good  REVIEW OF SYSTEMS:   Review of Systems  Constitutional: Negative for chills, fever and weight loss.  HENT: Negative for ear discharge, ear pain and nosebleeds.   Eyes: Negative for blurred vision, pain and discharge.  Respiratory: Negative for sputum production, shortness of breath, wheezing and stridor.   Cardiovascular: Negative for chest pain, palpitations, orthopnea and PND.  Gastrointestinal: Negative for abdominal pain, diarrhea, nausea and vomiting.  Genitourinary: Negative for frequency and urgency.  Musculoskeletal: Negative for back pain and joint pain.  Neurological: Positive for weakness. Negative for sensory change, speech change and focal weakness.  Psychiatric/Behavioral: Negative for depression and hallucinations. The patient is not nervous/anxious.    Tolerating Diet:yes Tolerating PT: rehab  DRUG ALLERGIES:   Allergies  Allergen Reactions  . Cortisone Other (See Comments)    Other reaction(s): Other (See Comments) GI Upset Unknown reaction  . Dye Fdc Red [Red Dye] Other (See Comments)    Unknown reaction.  . Iodine (Kelp)  [Iodine]     Other reaction(s): Other (See Comments) GI Upset  . Peanut Oil     Other reaction(s): Unknown  . Peanut-Containing Drug Products     Other reaction(s): Unknown  . Penicillins Rash    GI Upset patient not sure of reaction    VITALS:  Blood pressure 103/62, pulse 79, temperature 98 F (36.7 C), temperature source Oral, resp. rate 18, height 5\' 6"  (1.676 m), weight 59 kg (130 lb), SpO2 95 %.  PHYSICAL EXAMINATION:   Physical Exam  GENERAL:  [age] y.o.-year-old patient lying in the bed with no acute distress.  EYES: Pupils equal, round, reactive to light and accommodation. No scleral icterus. Extraocular muscles intact.  HEENT: Head  atraumatic, normocephalic. Oropharynx and nasopharynx clear.  NECK:  Supple, no jugular venous distention. No thyroid enlargement, no tenderness.  LUNGS: Normal breath sounds bilaterally, no wheezing, rales, rhonchi. No use of accessory muscles of respiration.  CARDIOVASCULAR: S1, S2 normal. No murmurs, rubs, or gallops.  ABDOMEN: Soft, nontender, nondistended. Bowel sounds present. No organomegaly or mass. Chronic foley EXTREMITIES: No cyanosis, clubbing or edema b/l.   Skin bruises NEUROLOGIC: Cranial nerves II through XII are intact. No focal Motor or sensory deficits b/l.   PSYCHIATRIC:  patient is alert and oriented x 3.  SKIN: No obvious rash, lesion, or ulcer.   LABORATORY PANEL:  CBC  Recent Labs Lab 02/20/16 0536  WBC 18.0*  HGB 13.3  HCT 39.5*  PLT 369    Chemistries   Recent Labs Lab 02/18/16 0526  02/21/16 0426  NA 131*  --   --   K 4.3  --   --   CL 96*  --   --   CO2 26  --   --   GLUCOSE 113*  --   --   BUN 22*  --   --   CREATININE 1.12  < > 1.22  CALCIUM 10.1  --   --   AST 50*  --   --   ALT 36  --   --   ALKPHOS 96  --   --   BILITOT 1.0  --   --   < > = values in this interval not displayed. Cardiac Enzymes  Recent Labs Lab 02/19/16 0733  TROPONINI 1.36*  RADIOLOGY:  No results found. ASSESSMENT AND PLAN:  Daniel ParkinsonGeorge Whiteis a [age] y.o. malewith a known history of COPD, CAD, hyperlipidemia comes to the emergency room with increasing shortness of breath for last 2 days. [facility] emergency room diagnosed with pneumonia and UTI discharged to home yesterday.  1. Acute respiratory failure secondary to right lower lobe pneumonia. Patient came in with increasing shortness of breath fatigue elevated Eastman count and chest distress suggestive off right lower lobe pneumonia. -IV Meropenem and po diflucan -Incentive spirometer -When necessary nebs -weaned to room air - blood cultures + Enterrococci and candida -ID input  appreciated  2. Sepsis enterococci and candida (pt has chronic foley catheter) -positive BC (biofire 02/18/16) -BC  from 11/10---negative -on IV meropenem and po fluconzole  3. History of COPD -Patient appears not wheezing at present I'll hold off on steroids. Continue nebulizer as needed  4.NSTEMI vs  elevated troponin appears to demand ischemia in the setting of pneumonia  Troponin 0.89--2.73--2.63--1.63 - cardiology consultation.d/w dr Juliann [place] Echo EF 45-50% -Lovenox 1 mg/kg bid (48 hours)--now d/ced -cont metoprolol - Pt denies cp -Aspirin, nitroglycerin when necessary,Imdur,statins  5. Chronic Foley catheter for last 7 months likely due to BPH  6. DVT prophylaxis Lovenox  D/c to rehab once his abxs are changed to po by ID   Case discussed with Care Management/Social Worker. Management plans discussed with the patient, family and they are in agreement.  CODE STATUS: FULL  DVT Prophylaxis:Lovenox TOTAL TIME TAKING CARE OF THIS PATIENT: 30 minutes.  >50% time spent on counselling and coordination of care pt and dter on phoine  POSSIBLE D/C IN 1-2DAYS, DEPENDING ON CLINICAL CONDITION.  Note: This dictation was prepared with Dragon dictation along with smaller phrase technology. Any transcriptional errors that result from this process are unintentional.  Lerin Jech M.D on 02/21/2016 at 11:25 AM  Between 7am to 6pm - Pager - [phone]  After 6pm go to [email or web address] - password [email or web address] [email or web address]  [facility] [place] [facility]  Office  [phone]  CC: Primary care physician; Dortha KernBLISS, LAURA K, MD

## 2016-02-21 NOTE — Plan of Care (Addendum)
Problem: Education: Goal: Knowledge of [facility] Education information/materials will improve Outcome: Progressing Afebrile, free of falls during shift.  BP 90/51 @ 2115, scheduled metoprolol held.  AM BP 105/60.  Enteric precautions d/c'ed, c. diff negative, pt aware.  Denies pain, nausea.  Pt asleep majority of shift post- scheduled melatonin.  Pt compliant with q2h turning for skin integrity.  Foley care provided, no issues.  Bed in low position, bed alarm on.  Call bell within reach, WCTM.

## 2016-02-21 NOTE — Care Management Important Message (Signed)
Important Message  Patient Details  Name: Daniel Dickson MRN: [id number] Date of Birth: 09/25/1912   Medicare Important Message Given:  Yes    Tiney Zipper A, RN 02/21/2016, 3:01 PM

## 2016-02-22 MED ORDER — METRONIDAZOLE 500 MG PO TABS: 500.0000 mg | ORAL_TABLET | Freq: Three times a day (TID) | ORAL | 0 refills | Status: DC

## 2016-02-22 MED ORDER — LEVOFLOXACIN 500 MG PO TABS: 500.0000 mg | ORAL_TABLET | Freq: Every day | ORAL | 0 refills | Status: DC

## 2016-02-22 MED ORDER — METOPROLOL SUCCINATE ER 25 MG PO TB24: 25.0000 mg | ORAL_TABLET | Freq: Every day | ORAL | Status: DC

## 2016-02-22 NOTE — Progress Notes (Signed)
Report given to [facility]. Deitrick Ferreri S, RN  

## 2016-02-22 NOTE — Care Management (Signed)
Discharge to [facility] today per Dr. Elpidio AnisSudini. Discharge summary faxed to [facility] at [facility] Gwenette GreetBrenda S Daryan Cagley RN MSN CCM Care Management [phone]

## 2016-02-22 NOTE — Discharge Summary (Signed)
[employer] - [place] at [facility]   PATIENT NAME: Daniel Dickson    MR#:  [id number]  DATE OF BIRTH:  [birth year]  DATE OF ADMISSION:  02/18/2016 ADMITTING PHYSICIAN: Enedina Finner, MD  DATE OF DISCHARGE: 02/22/2016  PRIMARY CARE PHYSICIAN: BLISS, Doreene Nest, MD   ADMISSION DIAGNOSIS:  Hypoxia [R09.02] Elevated troponin [R74.8] Dyspnea, unspecified type [R06.00]  DISCHARGE DIAGNOSIS:  Active Problems:   Acute respiratory failure (HCC)   SECONDARY DIAGNOSIS:   Past Medical History:  Diagnosis Date  . Anxiety   . COPD (chronic obstructive pulmonary disease) (HCC)   . Coronary artery disease    a. 2012 s/p MI/cardiac arrest--> PCI mRCA.  Marland Kitchen Hypertensive heart disease   . Mixed hyperlipidemia   . PTSD (post-traumatic stress disorder)    with headaches     ADMITTING HISTORY  HISTORY OF PRESENT ILLNESS:  Daniel Dickson  is a 82 y.o. male with a known history of COPD, CAD, hyperlipidemia comes to the emergency room with increasing shortness of breath for last 2 days. Patient was seen at [facility] emergency room diagnosed with pneumonia and UTI discharged to home yesterday. He continued to feel fatigued short of breath came to the emergency room sats were 90-91% on room air. Patient was mildly tachycardic no fever was noted and had elevated Mcguffin count with a chest x-ray suggestive of right lower lobe pneumonia. Given hand failing outpatient antibiotic treatment patient is being admitted for acute respiratory failure secondary to right lower lobe pneumonia. He received IV Levaquin in the emergency room.   HOSPITAL COURSE:   Daniel Dickson a 82 y.o. malewith a known history of COPD, CAD, hyperlipidemia comes to the emergency room with increasing shortness of breath for last 2 days. Patient was seen at [facility] emergency room diagnosed with pneumonia and UTI discharged to home yesterday.  1. Acute respiratory failure secondary to bibasilar pneumonia. Patient  came in with increasing shortness of breath fatigue elevated [place] count and chest distress suggestive off pneumonia. - Started with Vancomycin and meropenem. Later vancomycin stopped -Incentive spirometer -When necessary nebs -weaned to room air - blood cultures showed Enterrococci and candida and Staph. Discussed with Dr. Sampson Goon with ID and thought to be contaminant and not true infection. - Treated with Diflucan initially which is being stopped  He likely has aspiration pneumonia. Levaquin and flagyl for 4 more days after discharge.  Ucx negative.  2. Sepsis  Resolved  3. History of COPD . Continue nebulizer as needed  4.NSTEMI vs elevated troponin appears to demand ischemia in the setting of pneumonia  Troponin 0.89--2.73--2.63--1.63 -cardiology consultation.d/w dr Juliann Pares Echo EF 45-50% -Lovenox 1 mg/kg bid (48 hours)--now d/ced -cont metoprolol - Pt denies cp -Aspirin, nitroglycerin when necessary, Imdur, statins  5. Chronic Foley catheter for last 7 months likely due to BPH  Stable for discharge to SNF  Has chronic foley catheter  CONSULTS OBTAINED:  Treatment Team:  Alwyn Pea, MD Mick Sell, MD  DRUG ALLERGIES:   Allergies  Allergen Reactions  . Cortisone Other (See Comments)    Other reaction(s): Other (See Comments) GI Upset Unknown reaction  . Dye Fdc Red [Red Dye] Other (See Comments)    Unknown reaction.  . Iodine (Kelp)  [Iodine]     Other reaction(s): Other (See Comments) GI Upset  . Peanut Oil     Other reaction(s): Unknown  . Peanut-Containing Drug Products     Other reaction(s): Unknown  . Penicillins Rash  GI Upset patient not sure of reaction    DISCHARGE MEDICATIONS:   Current Discharge Medication List    START taking these medications   Details  levofloxacin (LEVAQUIN) 500 MG tablet Take 1 tablet (500 mg total) by mouth daily. Qty: 4 tablet, Refills: 0    metoprolol succinate (TOPROL XL) 25 MG 24  hr tablet Take 1 tablet (25 mg total) by mouth daily.    metroNIDAZOLE (FLAGYL) 500 MG tablet Take 1 tablet (500 mg total) by mouth 3 (three) times daily. Qty: 12 tablet, Refills: 0      CONTINUE these medications which have NOT CHANGED   Details  acetaminophen (TYLENOL) 500 MG tablet Take 500 mg by mouth daily as needed for mild pain or moderate pain.     albuterol (PROAIR HFA) 108 (90 BASE) MCG/ACT inhaler Inhale 2 puffs into the lungs every 6 (six) hours as needed. For wheezing.    albuterol (PROVENTIL) (2.5 MG/3ML) 0.083% nebulizer solution Take 3 mLs (2.5 mg total) by nebulization every 8 (eight) hours as needed for wheezing or shortness of breath. Qty: 75 mL, Refills: 12    aspirin EC 81 MG tablet Take 81 mg by mouth daily.    benzonatate (TESSALON) 200 MG capsule Take 200 mg by mouth 3 (three) times daily as needed for cough.    Cholecalciferol (VITAMIN D3) 1000 UNITS CAPS Take 1,000 Units by mouth daily.    finasteride (PROSCAR) 5 MG tablet Take 1 tablet (5 mg total) by mouth daily. Qty: 15 tablet, Refills: 0    fluticasone furoate-vilanterol (BREO ELLIPTA) 100-25 MCG/INH AEPB Inhale 1 puff into the lungs daily. Reported on 10/19/2015    isosorbide mononitrate (IMDUR) 30 MG 24 hr tablet Take 1 tablet (30 mg total) by mouth daily. Qty: 30 tablet, Refills: 5    loratadine (CLARITIN) 10 MG tablet Take 10 mg by mouth daily.    magnesium hydroxide (MILK OF MAGNESIA) 400 MG/5ML suspension Take 30 mLs by mouth 2 (two) times daily as needed for mild constipation or moderate constipation. Qty: 360 mL, Refills: 0    Melatonin 5 MG CAPS Take 10 mg by mouth at bedtime.    mupirocin cream (BACTROBAN) 2 % Apply topically daily. Qty: 15 g, Refills: 0    nitroGLYCERIN (NITROSTAT) 0.4 MG SL tablet Place 0.4 mg under the tongue every 5 (five) minutes as needed for chest pain. Reported on 10/19/2015    ondansetron (ZOFRAN) 4 MG tablet Take 1 tablet (4 mg total) by mouth every 6 (six)  hours as needed for nausea. Qty: 20 tablet, Refills: 0    pantoprazole (PROTONIX) 40 MG tablet Take 1 tablet (40 mg total) by mouth daily. Qty: 30 tablet, Refills: 5    pravastatin (PRAVACHOL) 80 MG tablet Take 80 mg by mouth daily.    tamsulosin (FLOMAX) 0.4 MG CAPS capsule Take 0.4 mg by mouth daily. Take 30 minutes after same meal each day.    tiotropium (SPIRIVA) 18 MCG inhalation capsule Place 1 capsule (18 mcg total) into inhaler and inhale every evening. Qty: 30 capsule, Refills: 12    feeding supplement (BOOST / RESOURCE BREEZE) LIQD Take 1 Container by mouth 3 (three) times daily between meals. Qty: 90 Container, Refills: 5        Today   VITAL SIGNS:  Blood pressure 111/62, pulse 92, temperature 97.6 F (36.4 C), temperature source Oral, resp. rate 20, height 5\' 6"  (1.676 m), weight 59 kg (130 lb), SpO2 92 %.  I/O:   Intake/Output  Summary (Last 24 hours) at 02/22/16 1050 Last data filed at 02/22/16 1003  Gross per 24 hour  Intake              480 ml  Output             1900 ml  Net            -1420 ml    PHYSICAL EXAMINATION:  Physical Exam  GENERAL:  [age] y.o.-year-old patient lying in the bed with no acute distress.  LUNGS: Normal breath sounds bilaterally, no wheezing, rales,rhonchi or crepitation. No use of accessory muscles of respiration.  CARDIOVASCULAR: S1, S2 normal. No murmurs, rubs, or gallops.  ABDOMEN: Soft, non-tender, non-distended. Bowel sounds present. No organomegaly or mass.  NEUROLOGIC: Moves all 4 extremities. PSYCHIATRIC: The patient is alert and oriented x 3.   Foley in place  DATA REVIEW:   CBC  Recent Labs Lab 02/20/16 0536  WBC 18.0*  HGB 13.3  HCT 39.5*  PLT 369    Chemistries   Recent Labs Lab 02/18/16 0526  02/21/16 0426  NA 131*  --   --   K 4.3  --   --   CL 96*  --   --   CO2 26  --   --   GLUCOSE 113*  --   --   BUN 22*  --   --   CREATININE 1.12  < > 1.22  CALCIUM 10.1  --   --   AST 50*  --   --    ALT 36  --   --   ALKPHOS 96  --   --   BILITOT 1.0  --   --   < > = values in this interval not displayed.  Cardiac Enzymes  Recent Labs Lab 02/19/16 0733  TROPONINI 1.36*    Microbiology Results  Results for orders placed or performed during the hospital encounter of 02/18/16  Urine culture     Status: None   Collection Time: 02/18/16  5:25 AM  Result Value Ref Range Status   Specimen Description URINE, CATHETERIZED  Final   Special Requests NONE  Final   Culture NO GROWTH Performed at [facility]   Final   Report Status 02/19/2016 FINAL  Final  Culture, blood (routine x 2)     Status: Abnormal   Collection Time: 02/18/16  5:25 AM  Result Value Ref Range Status   Specimen Description BLOOD RIGHT FOREARM  Final   Special Requests   Final    BOTTLES DRAWN AEROBIC AND ANAEROBIC  ANA 1ML AER 2ML   Culture  Setup Time   Final    GRAM POSITIVE COCCI IN BOTH AEROBIC AND ANAEROBIC BOTTLES CRITICAL RESULT CALLED TO, READ BACK BY AND VERIFIED WITH: JASON ROBBINS 02/18/16 @ 1922  MLK    Culture (A)  Final    ENTEROCOCCUS FAECALIS STAPHYLOCOCCUS SPECIES (COAGULASE NEGATIVE) THE SIGNIFICANCE OF ISOLATING THIS ORGANISM FROM A SINGLE SET OF BLOOD CULTURES WHEN MULTIPLE SETS ARE DRAWN IS UNCERTAIN. PLEASE NOTIFY THE MICROBIOLOGY DEPARTMENT WITHIN ONE WEEK IF SPECIATION AND SENSITIVITIES ARE REQUIRED. CANDIDA ALBICANS DETECTED ON BCID BUT NOT RECOVERED IN  CULTURE Performed at [facility]    Report Status 02/21/2016 FINAL  Final   Organism ID, Bacteria ENTEROCOCCUS FAECALIS  Final      Susceptibility   Enterococcus faecalis - MIC*    AMPICILLIN <=2 SENSITIVE Sensitive     VANCOMYCIN 1 SENSITIVE Sensitive  GENTAMICIN SYNERGY RESISTANT Resistant     * ENTEROCOCCUS FAECALIS  Blood Culture ID Panel (Reflexed)     Status: Abnormal   Collection Time: 02/18/16  5:25 AM  Result Value Ref Range Status   Enterococcus species DETECTED (A) NOT DETECTED Final     Comment: CRITICAL RESULT CALLED TO, READ BACK BY AND VERIFIED WITH: JASON ROBBINS 02/18/16 @ 1922  MLK    Vancomycin resistance NOT DETECTED NOT DETECTED Final   Listeria monocytogenes NOT DETECTED NOT DETECTED Final   Staphylococcus species NOT DETECTED NOT DETECTED Final   Staphylococcus aureus NOT DETECTED NOT DETECTED Final   Streptococcus species NOT DETECTED NOT DETECTED Final   Streptococcus agalactiae NOT DETECTED NOT DETECTED Final   Streptococcus pneumoniae NOT DETECTED NOT DETECTED Final   Streptococcus pyogenes NOT DETECTED NOT DETECTED Final   Acinetobacter baumannii NOT DETECTED NOT DETECTED Final   Enterobacteriaceae species NOT DETECTED NOT DETECTED Final   Enterobacter cloacae complex NOT DETECTED NOT DETECTED Final   Escherichia coli NOT DETECTED NOT DETECTED Final   Klebsiella oxytoca NOT DETECTED NOT DETECTED Final   Klebsiella pneumoniae NOT DETECTED NOT DETECTED Final   Proteus species NOT DETECTED NOT DETECTED Final   Serratia marcescens NOT DETECTED NOT DETECTED Final   Haemophilus influenzae NOT DETECTED NOT DETECTED Final   Neisseria meningitidis NOT DETECTED NOT DETECTED Final   Pseudomonas aeruginosa NOT DETECTED NOT DETECTED Final   Candida albicans DETECTED (A) NOT DETECTED Final    Comment: CRITICAL RESULT CALLED TO, READ BACK BY AND VERIFIED WITH: JASON ROBBINS 02/18/16 @ 1922  MLK    Candida glabrata NOT DETECTED NOT DETECTED Final   Candida krusei NOT DETECTED NOT DETECTED Final   Candida parapsilosis NOT DETECTED NOT DETECTED Final   Candida tropicalis NOT DETECTED NOT DETECTED Final  Culture, blood (routine x 2)     Status: None (Preliminary result)   Collection Time: 02/18/16  5:26 AM  Result Value Ref Range Status   Specimen Description BLOOD LEFT FOREARM  Final   Special Requests   Final    BOTTLES DRAWN AEROBIC AND ANAEROBIC  ANA AER   Culture NO GROWTH 4 DAYS  Final   Report Status PENDING  Incomplete  Culture, blood (single) w  Reflex to ID Panel     Status: None (Preliminary result)   Collection Time: 02/19/16  2:44 PM  Result Value Ref Range Status   Specimen Description BLOOD LEFT HAND  Final   Special Requests   Final    BOTTLES DRAWN AEROBIC AND ANAEROBIC  AER 5CC ANA 2CC   Culture NO GROWTH 3 DAYS  Final   Report Status PENDING  Incomplete  C difficile quick scan w PCR reflex     Status: None   Collection Time: 02/20/16  6:16 PM  Result Value Ref Range Status   C Diff antigen NEGATIVE NEGATIVE Final   C Diff toxin NEGATIVE NEGATIVE Final   C Diff interpretation No C. difficile detected.  Final    RADIOLOGY:  No results found.  Follow up with PCP in 1 week.  Management plans discussed with the patient, family and they are in agreement.  CODE STATUS:     Code Status Orders        Start     Ordered   02/18/16 0828  Full code  Continuous     02/18/16 0827    Code Status History    Date Active Date Inactive Code Status Order ID Comments User Context  10/29/2015  9:19 PM 11/02/2015  2:22 PM Full Code [id number]  Enid Baas, MD Inpatient   10/07/2015  7:32 AM 10/09/2015  4:29 PM Full Code [id number]  Ihor Austin, MD Inpatient   06/07/2015 12:04 AM 06/10/2015  4:31 PM Full Code [id number]  Oralia Manis, MD Inpatient   01/11/2015  1:54 PM 01/12/2015  1:58 PM Full Code [id number]  Alford Highland, MD ED      TOTAL TIME TAKING CARE OF THIS PATIENT ON DAY OF DISCHARGE: more than 30 minutes.   Milagros Loll R M.D on 02/22/2016 at 10:50 AM  Between 7am to 6pm - Pager - [phone]  After 6pm go to [email or web address] - password [email or web address] [facility]  [facility] [place] [facility]  Office  [phone]  CC: Primary care physician; [name], MD  Note: This dictation was prepared with Dragon dictation along with smaller phrase technology. Any transcriptional errors that result from this process are unintentional.

## 2016-02-22 NOTE — Progress Notes (Signed)
Patient discharged to [facility] via EMS. Latrisa Hellums S, RN 

## 2016-02-22 NOTE — Discharge Instructions (Signed)
Dysphagia 3 diet with thin liquids. Aspiration precautions.  Activity as tolerated.  Chronic foley care

## 2016-02-22 NOTE — Progress Notes (Signed)
Pharmacy Antibiotic Note  Daniel Dickson is a [age] y.o. male admitted on 02/18/2016 with bacteremia.  Pharmacy has been consulted for fluconazole and meropenem dosing.  Patient was receiving levofloxacin. Antibiotics being changed to fluconazole and meropenem per BCID results of enterococcus and candida albicans bacteremia Per ID. Patient has a chronic foley and PCN allergy. He was recently diagnosed with P[facility] and [facility] at [facility]. ID has been consulted.  Plan: Meropenem 1 g IV q12h  Fluconazole 200 mg PO daily per renal function.  Height: 5\' 6"  (167.6 cm) Weight: 130 lb (59 kg) IBW/kg (Calculated) : 63.8  Temp (24hrs), Avg:97.6 F (36.4 C), Min:97.6 F (36.4 C), Max:97.6 F (36.4 C)   Recent Labs Lab 02/18/16 0526 02/19/16 0733 02/20/16 0536 02/21/16 0426  WBC 12.7* 14.7* 18.0*  --   CREATININE 1.12  --  1.29* 1.22  LATICACIDVEN 1.9  --   --   --     Estimated Creatinine Clearance: 26.2 mL/min (by C-G formula based on SCr of 1.22 mg/dL).    Allergies  Allergen Reactions  . Cortisone Other (See Comments)    Other reaction(s): Other (See Comments) GI Upset Unknown reaction  . Dye Fdc Red [Red Dye] Other (See Comments)    Unknown reaction.  . Iodine (Kelp)  [Iodine]     Other reaction(s): Other (See Comments) GI Upset  . Peanut Oil     Other reaction(s): Unknown  . Peanut-Containing Drug Products     Other reaction(s): Unknown  . Penicillins Rash    GI Upset patient not sure of reaction    Antimicrobials this admission: meropenem 11/10 >>  fluconazole 11/10 >>  Levofloxacin 11/9 >> 11/10  Dose adjustments this admission:  Microbiology results: 11/9 BCx: Enterococcus and candida albicans, 11/9 UCx: No growth final   Thank you for allowing pharmacy to be a part of this patient's care.  Marty HeckWang, Delisia Mcquiston L, PharmD, BCPS Clinical Pharmacist 02/22/2016 11:15 AM

## 2016-02-22 NOTE — Progress Notes (Signed)
EMS called for transport to [facility]. Zayvian Mcmurtry S, RN  

## 2016-02-22 NOTE — Progress Notes (Addendum)
LCSW spoke with MD that patient is medically stable for discharge back to SNF. He will return to [facility] this afternoon. Patient will transport by EMS and LCSW will complete discharge. Facility made aware of plan and discharge. Facility checking on insurance authorization, will follow up when patient can be sent. Will follow up with family regarding discharge plan.  Daughter made aware of discharge and in agreement. Requests and ambulance and will meet patient at facility this evening.  Daniel EmoryHannah Caelan Atchley LCSW, MSW Clinical Social Work: Optician, dispensingystem [employer] Coverage for :  [phone]

## 2016-02-22 NOTE — Progress Notes (Signed)
[facility] CLINIC INFECTIOUS DISEASE PROGRESS NOTE Date of Admission:  02/18/2016     ID: Daniel Dickson is a 82 y.o. male with  Bacteremia, candidemia Active Problems:   Acute respiratory failure (HCC)   Subjective: No fevers, breathing improved some  ROS  Eleven systems are reviewed and negative except per hpi  Medications:  Antibiotics Given (last 72 hours)    Date/Time Action Medication Dose Rate   02/19/16 1817 Given   meropenem (MERREM) IVPB SOLR 1 g 1 g 100 mL/hr   02/20/16 0952 Given   meropenem (MERREM) IVPB SOLR 1 g 1 g 100 mL/hr   02/20/16 2007 Given   meropenem (MERREM) IVPB SOLR 1 g 1 g 100 mL/hr   02/21/16 0947 Given   meropenem (MERREM) IVPB SOLR 1 g 1 g 100 mL/hr   02/21/16 2107 Given   meropenem (MERREM) IVPB SOLR 1 g 1 g 100 mL/hr   02/22/16 0749 Given   meropenem (MERREM) IVPB SOLR 1 g 1 g 100 mL/hr     . aspirin EC  81 mg Oral Daily  . cholecalciferol  1,000 Units Oral Daily  . enoxaparin (LOVENOX) injection  30 mg Subcutaneous Q24H  . feeding supplement (ENSURE ENLIVE)  237 mL Oral TID WC  . finasteride  5 mg Oral Daily  . fluconazole  200 mg Oral Daily  . fluticasone furoate-vilanterol  1 puff Inhalation Daily  . isosorbide mononitrate  30 mg Oral Daily  . loratadine  10 mg Oral Daily  . Melatonin  10 mg Oral QHS  . meropenem  1 g Intravenous BID  . metoprolol tartrate  12.5 mg Oral BID  . pantoprazole  40 mg Oral Daily  . pravastatin  80 mg Oral Daily  . tamsulosin  0.4 mg Oral Daily  . tiotropium  18 mcg Inhalation QPM    Objective: Vital signs in last 24 hours: Temp:  [97.6 F (36.4 C)] 97.6 F (36.4 C) (11/13 0428) Pulse Rate:  [85-92] 92 (11/13 0428) Resp:  [20] 20 (11/13 0428) BP: (102-111)/(58-68) 111/62 (11/13 0428) SpO2:  [92 %-94 %] 92 % (11/13 0428) Constitutional: He is oriented to person, place, and time. Frail, HENT: anicteric Mouth/Throat: Oropharynx is clear and dry. No oropharyngeal exudate.  Cardiovascular:very  distant Pulmonary/Chest: poor air movement Abdominal: Soft. Bowel sounds are normal. He exhibits no distension. There is no tenderness.  Lymphadenopathy: He has no cervical adenopathy.  Neurological: He is alert and oriented to person, place, and time.  Skin: multiple skin bruising and scaly skin  Psychiatric: He has a normal mood and affect. His behavior is normal  Lab Results  Recent Labs  02/20/16 0536 02/21/16 0426  WBC 18.0*  --   HGB 13.3  --   HCT 39.5*  --   CREATININE 1.29* 1.22    Microbiology: Results for orders placed or performed during the hospital encounter of 02/18/16  Urine culture     Status: None   Collection Time: 02/18/16  5:25 AM  Result Value Ref Range Status   Specimen Description URINE, CATHETERIZED  Final   Special Requests NONE  Final   Culture NO GROWTH Performed at [facility]   Final   Report Status 02/19/2016 FINAL  Final  Culture, blood (routine x 2)     Status: Abnormal   Collection Time: 02/18/16  5:25 AM  Result Value Ref Range Status   Specimen Description BLOOD RIGHT FOREARM  Final   Special Requests   Final  BOTTLES DRAWN AEROBIC AND ANAEROBIC  ANA 1ML AER 2ML   Culture  Setup Time   Final    GRAM POSITIVE COCCI IN BOTH AEROBIC AND ANAEROBIC BOTTLES CRITICAL RESULT CALLED TO, READ BACK BY AND VERIFIED WITH: JASON ROBBINS 02/18/16 @ 1922  MLK    Culture (A)  Final    ENTEROCOCCUS FAECALIS STAPHYLOCOCCUS SPECIES (COAGULASE NEGATIVE) THE SIGNIFICANCE OF ISOLATING THIS ORGANISM FROM A SINGLE SET OF BLOOD CULTURES WHEN MULTIPLE SETS ARE DRAWN IS UNCERTAIN. PLEASE NOTIFY THE MICROBIOLOGY DEPARTMENT WITHIN ONE WEEK IF SPECIATION AND SENSITIVITIES ARE REQUIRED. CANDIDA ALBICANS DETECTED ON BCID BUT NOT RECOVERED IN  CULTURE [facility]    Report Status 02/21/2016 FINAL  Final   Organism ID, Bacteria ENTEROCOCCUS FAECALIS  Final      Susceptibility   Enterococcus faecalis - MIC*    AMPICILLIN <=2  SENSITIVE Sensitive     VANCOMYCIN 1 SENSITIVE Sensitive     GENTAMICIN SYNERGY RESISTANT Resistant     * ENTEROCOCCUS FAECALIS  Blood Culture ID Panel (Reflexed)     Status: Abnormal   Collection Time: 02/18/16  5:25 AM  Result Value Ref Range Status   Enterococcus species DETECTED (A) NOT DETECTED Final    Comment: CRITICAL RESULT CALLED TO, READ BACK BY AND VERIFIED WITH: JASON ROBBINS 02/18/16 @ 1922  MLK    Vancomycin resistance NOT DETECTED NOT DETECTED Final   Listeria monocytogenes NOT DETECTED NOT DETECTED Final   Staphylococcus species NOT DETECTED NOT DETECTED Final   Staphylococcus aureus NOT DETECTED NOT DETECTED Final   Streptococcus species NOT DETECTED NOT DETECTED Final   Streptococcus agalactiae NOT DETECTED NOT DETECTED Final   Streptococcus pneumoniae NOT DETECTED NOT DETECTED Final   Streptococcus pyogenes NOT DETECTED NOT DETECTED Final   Acinetobacter baumannii NOT DETECTED NOT DETECTED Final   Enterobacteriaceae species NOT DETECTED NOT DETECTED Final   Enterobacter cloacae complex NOT DETECTED NOT DETECTED Final   Escherichia coli NOT DETECTED NOT DETECTED Final   Klebsiella oxytoca NOT DETECTED NOT DETECTED Final   Klebsiella pneumoniae NOT DETECTED NOT DETECTED Final   Proteus species NOT DETECTED NOT DETECTED Final   Serratia marcescens NOT DETECTED NOT DETECTED Final   Haemophilus influenzae NOT DETECTED NOT DETECTED Final   Neisseria meningitidis NOT DETECTED NOT DETECTED Final   Pseudomonas aeruginosa NOT DETECTED NOT DETECTED Final   Candida albicans DETECTED (A) NOT DETECTED Final    Comment: CRITICAL RESULT CALLED TO, READ BACK BY AND VERIFIED WITH: JASON ROBBINS 02/18/16 @ 1922  MLK    Candida glabrata NOT DETECTED NOT DETECTED Final   Candida krusei NOT DETECTED NOT DETECTED Final   Candida parapsilosis NOT DETECTED NOT DETECTED Final   Candida tropicalis NOT DETECTED NOT DETECTED Final  Culture, blood (routine x 2)     Status: None (Preliminary  result)   Collection Time: 02/18/16  5:26 AM  Result Value Ref Range Status   Specimen Description BLOOD LEFT FOREARM  Final   Special Requests   Final    BOTTLES DRAWN AEROBIC AND ANAEROBIC  ANA 6ML AER 6ML   Culture NO GROWTH 4 DAYS  Final   Report Status PENDING  Incomplete  Culture, blood (single) w Reflex to ID Panel     Status: None (Preliminary result)   Collection Time: 02/19/16  2:44 PM  Result Value Ref Range Status   Specimen Description BLOOD LEFT HAND  Final   Special Requests   Final    BOTTLES DRAWN AEROBIC AND ANAEROBIC  AER 5CC ANA 2CC   Culture NO GROWTH 3 DAYS  Final   Report Status PENDING  Incomplete  C difficile quick scan w PCR reflex     Status: None   Collection Time: 02/20/16  6:16 PM  Result Value Ref Range Status   C Diff antigen NEGATIVE NEGATIVE Final   C Diff toxin NEGATIVE NEGATIVE Final   C Diff interpretation No C. difficile detected.  Final    Studies/Results: No results found.  Assessment/Plan: Result status: Final result   [facility]* [address] [place], [place] [address] [phone]  ------------------------------------------------------------------- Transthoracic Echocardiography  Patient: Daniel GoldsWhite, Nichols Henry MR #: [id number] Study Date: 02/18/2016 Gender: M Age: 6 Height: 167.6 cm Weight: 59 kg BSA: 1.66 m^2 Pt. Status: Room: 117A  ADMITTING Allena KatzPatel, Sona A ATTENDING Allena KatzPatel, Sona A Leota JacobsenDERING Patel, Sona A REFERRING Enedina FinnerPatel, Sona A SONOGRAPHER Cristela BlueJerry Hege RDCS PERFORMING [facility]  cc:  ------------------------------------------------------------------- LV EF: 40% - 45%  ------------------------------------------------------------------- Indications: Elevated  Troponin.  ------------------------------------------------------------------- History: PMH: PTSD. Mixed HLD, anxiety. Coronary artery disease. Chronic obstructive pulmonary disease.  ------------------------------------------------------------------- Study Conclusions  - Procedure narrative: Transthoracic echocardiography. The study was technically difficult. - Left ventricle: The cavity size was mildly dilated. Systolic function was mildly to moderately reduced. The estimated ejection fraction was in the range of 40% to 45%. Wall motion was normal; there were no regional wall motion abnormalities. - Aortic valve: Valve area (VTI): 3.6 cm^2. Valve area (Vmax): 3.14 cm^2. Valve area (Vmean): 3.13 cm^2. - Right ventricle: The cavity size was mildly dilated. Wall thickness was normal. - Right atrium: The atrium was mildly dilated. - Pericardium, extracardiac: A trivial pericardial effusion was identified.  Impressions:  - No cardiac source of embolism was identified, but cannot be ruled out on the basis of this examination.     Assessment:   Daniel Dickson is a [age] y.o. male admitted with cough and sob and found to have bibasilar PNA and then bcx + candida, coag neg staph  and enterococcus by biofire in 1/2.  He has a chronic foley but it is unclear what was [facility]   Given that he has 3 organisms in one bottle while other one is negative and no obvious source of such bacteremia/fungemia I think we can consider this contaminant. TTE was neg but difficult study  I would not treat the bacteremia or fungemia specifically  He also has a concerning 30# wt loss per report and great difficulty swallowing.   Recommendations Can dc on abx for aspiration PNA with levo and flagyl ( PCN allergy)  Thank you very much for the consult. Will follow with you.  FITZGERALD, DAVID  P   02/22/2016, 9:59 AM

## 2016-02-22 NOTE — Progress Notes (Signed)
Nutrition Follow-up  DOCUMENTATION CODES:   Not applicable  INTERVENTION:  Continue Ensure Enlive po TID, each supplement provides 350 kcal and 20 grams of protein. Was going to change, but as discharge summary already in will keep the same.  NUTRITION DIAGNOSIS:   Inadequate oral intake related to dysphagia, poor appetite as evidenced by per patient/family report.  Improving.  GOAL:   Patient will meet greater than or equal to 90% of their needs  Met.  MONITOR:   PO intake, I & O's, Labs, Weight trends, Supplement acceptance  REASON FOR ASSESSMENT:   Malnutrition Screening Tool    ASSESSMENT:   Daniel Dickson is a 82 yo male Admitted to [facility] with the diagnosis of acute respiratory failure  Spoke with patient at bedside. He reports good appetite and intake. He reports drinking 2 Ensure Enlive yesterday.  Meal Completion: 30-100% per chart  Patient has had 1517 kcal (100% estimated kcal needs) and 76 grams protein (100% estimated protein needs) in the past 24 hours.  Medications reviewed and include: Vitamin D 1000 units daily, pantoprazole.  Labs reviewed: None pertinent.  Discussed with RN. Patient told RN that he is not wanting to drink Ensure because he thinks it may be causing his diarrhea (on antibiotics).   Diet Order:  DIET DYS 3 Room service appropriate? Yes with Assist; Fluid consistency: Thin  Skin:  Reviewed, no issues  Last BM:  PTA  Height:   Ht Readings from Last 1 Encounters:  02/18/16 5' 6"  (1.676 m)    Weight:   Wt Readings from Last 1 Encounters:  02/18/16 130 lb (59 kg)    Ideal Body Weight:  64.54 kg  BMI:  Body mass index is 20.98 kg/m.  Estimated Nutritional Needs:   Kcal:  1475-1770 calories  Protein:  70-83 gm  Fluid:  >/= 1.5L  EDUCATION NEEDS:   No education needs identified at this time  Willey Blade, MS, RD, LDN Pager: [phone] After Hours Pager: [phone]

## 2016-02-23 ENCOUNTER — Telehealth: Payer: Self-pay | Admitting: Cardiovascular Disease

## 2016-02-23 LAB — CULTURE, BLOOD (ROUTINE X 2): CULTURE: NO GROWTH

## 2016-02-23 NOTE — Telephone Encounter (Signed)
3 attempts to schedule fu from recall list.  lmov for Daughter Daniel Dickson. Deleting recall.

## 2016-02-24 LAB — CULTURE, BLOOD (SINGLE): CULTURE: NO GROWTH

## 2016-03-09 ENCOUNTER — Ambulatory Visit: Payer: Medicare PPO | Admitting: Urology

## 2016-03-18 ENCOUNTER — Telehealth: Payer: Self-pay

## 2016-03-18 NOTE — Telephone Encounter (Signed)
Pt home health nurse called requesting orders to change pt catheter at home monthly. Verbal orders were given.

## 2016-04-06 ENCOUNTER — Encounter: Payer: Self-pay | Admitting: Emergency Medicine

## 2016-04-06 ENCOUNTER — Emergency Department: Payer: Medicare PPO

## 2016-04-06 ENCOUNTER — Emergency Department
Admission: EM | Admit: 2016-04-06 | Discharge: 2016-04-06 | Disposition: A | Discharge status: Home / Self Care | Payer: Medicare PPO | Attending: Emergency Medicine | Admitting: Emergency Medicine

## 2016-04-06 DIAGNOSIS — Z87891 Personal history of nicotine dependence: Secondary | ICD-10-CM | POA: Diagnosis not present

## 2016-04-06 DIAGNOSIS — J449 Chronic obstructive pulmonary disease, unspecified: Secondary | ICD-10-CM | POA: Diagnosis not present

## 2016-04-06 DIAGNOSIS — R062 Wheezing: Secondary | ICD-10-CM | POA: Insufficient documentation

## 2016-04-06 DIAGNOSIS — Z79899 Other long term (current) drug therapy: Secondary | ICD-10-CM | POA: Diagnosis not present

## 2016-04-06 DIAGNOSIS — R0602 Shortness of breath: Secondary | ICD-10-CM | POA: Insufficient documentation

## 2016-04-06 DIAGNOSIS — R079 Chest pain, unspecified: Secondary | ICD-10-CM | POA: Insufficient documentation

## 2016-04-06 DIAGNOSIS — Z7982 Long term (current) use of aspirin: Secondary | ICD-10-CM | POA: Diagnosis not present

## 2016-04-06 DIAGNOSIS — I119 Hypertensive heart disease without heart failure: Secondary | ICD-10-CM | POA: Insufficient documentation

## 2016-04-06 DIAGNOSIS — I2510 Atherosclerotic heart disease of native coronary artery without angina pectoris: Secondary | ICD-10-CM | POA: Insufficient documentation

## 2016-04-06 LAB — CBC WITH DIFFERENTIAL/PLATELET
Basophils Absolute: 0.1 10*3/uL (ref 0–0.1)
Basophils Relative: 1 %
EOS ABS: 0.5 10*3/uL (ref 0–0.7)
EOS PCT: 6 %
HCT: 39.1 % — ABNORMAL LOW (ref 40.0–52.0)
Hemoglobin: 13.4 g/dL (ref 13.0–18.0)
LYMPHS ABS: 1.3 10*3/uL (ref 1.0–3.6)
Lymphocytes Relative: 13 %
MCH: 32.2 pg (ref 26.0–34.0)
MCHC: 34.2 g/dL (ref 32.0–36.0)
MCV: 94.3 fL (ref 80.0–100.0)
MONOS PCT: 7 %
Monocytes Absolute: 0.7 10*3/uL (ref 0.2–1.0)
Neutro Abs: 7.4 10*3/uL — ABNORMAL HIGH (ref 1.4–6.5)
Neutrophils Relative %: 73 %
PLATELETS: 204 10*3/uL (ref 150–440)
RBC: 4.15 MIL/uL — ABNORMAL LOW (ref 4.40–5.90)
RDW: 16.4 % — ABNORMAL HIGH (ref 11.5–14.5)
WBC: 10.1 10*3/uL (ref 3.8–10.6)

## 2016-04-06 LAB — COMPREHENSIVE METABOLIC PANEL
ALK PHOS: 42 U/L (ref 38–126)
ALT: 19 U/L (ref 17–63)
ANION GAP: 7 (ref 5–15)
AST: 34 U/L (ref 15–41)
Albumin: 3.5 g/dL (ref 3.5–5.0)
BUN: 22 mg/dL — ABNORMAL HIGH (ref 6–20)
CALCIUM: 9.4 mg/dL (ref 8.9–10.3)
CO2: 27 mmol/L (ref 22–32)
Chloride: 99 mmol/L — ABNORMAL LOW (ref 101–111)
Creatinine, Ser: 1.17 mg/dL (ref 0.61–1.24)
GFR, EST AFRICAN AMERICAN: 57 mL/min — AB (ref >60)
GFR, EST NON AFRICAN AMERICAN: 49 mL/min — AB (ref >60)
Glucose, Bld: 112 mg/dL — ABNORMAL HIGH (ref 65–99)
Potassium: 4.5 mmol/L (ref 3.5–5.1)
SODIUM: 133 mmol/L — AB (ref 135–145)
Total Bilirubin: 0.9 mg/dL (ref 0.3–1.2)
Total Protein: 7.1 g/dL (ref 6.5–8.1)

## 2016-04-06 LAB — TROPONIN I: Troponin I: <0.03 ng/mL (ref <0.03)

## 2016-04-06 MED ORDER — IOPAMIDOL (ISOVUE-370) INJECTION 76%
75.0000 mL | Freq: Once | INTRAVENOUS | Status: AC | PRN
  Administered 2016-04-06: 75 mL via INTRAVENOUS

## 2016-04-06 MED ORDER — BENZONATATE 100 MG PO CAPS: 100.0000 mg | ORAL_CAPSULE | Freq: Four times a day (QID) | ORAL | 0 refills | Status: DC | PRN

## 2016-04-06 MED ORDER — FAMOTIDINE 40 MG PO TABS: 40.0000 mg | ORAL_TABLET | Freq: Every evening | ORAL | 1 refills | Status: DC

## 2016-04-06 MED ORDER — IPRATROPIUM-ALBUTEROL 0.5-2.5 (3) MG/3ML IN SOLN
3.0000 mL | Freq: Once | RESPIRATORY_TRACT | Status: AC
  Administered 2016-04-06: 3 mL via RESPIRATORY_TRACT
  Filled 2016-04-06: qty 3

## 2016-04-06 MED ORDER — SODIUM CHLORIDE 0.9 % IV SOLN
Freq: Once | INTRAVENOUS | Status: AC
  Administered 2016-04-06: 18:00:00 via INTRAVENOUS

## 2016-04-06 MED ORDER — LIDOCAINE VISCOUS 2 % MT SOLN
15.0000 mL | Freq: Once | OROMUCOSAL | Status: AC
  Administered 2016-04-06: 15 mL via OROMUCOSAL
  Filled 2016-04-06: qty 15

## 2016-04-06 NOTE — ED Provider Notes (Signed)
Second troponin is negative patient reports the pain improved a lot with GI cocktail. It usually worse at night although not always. I will give him some Pepcid to see if that will help at all. I will have him follow-up with his regular doctor. I should add the pain is not worse with exertion. Patient's allergy to iodine is GI upset.  Study Result   CLINICAL DATA:  Chest pain.  Central chest pain.  EXAM: CT ANGIOGRAPHY CHEST WITH CONTRAST  TECHNIQUE: Multidetector CT imaging of the chest was performed using the standard protocol during bolus administration of intravenous contrast. Multiplanar CT image reconstructions and MIPs were obtained to evaluate the vascular anatomy.  CONTRAST:  75 mL Isovue  COMPARISON:  Chest radiograph 04/06/2016  FINDINGS: Cardiovascular: No filling defects within the pulmonary to suggest acute pulmonary embolism. No acute findings aorta great vessels. Intimal calcification aorta. Coronary calcification. No pericardial fluid.  Mediastinum/Nodes: No axillary supraclavicular adenopathy. No mediastinal hilar adenopathy. Esophagus normal.  Lungs/Pleura: Centrilobular emphysema in the upper lobes. No pneumothorax. No pneumonia. No pulmonary infarction. There is atelectasis at the LEFT lung base a small amount of fluid. Mild bronchiectasis in lower lobes.  Upper Abdomen: Limited view of the liver, kidneys, pancreas are unremarkable. Normal adrenal glands.  Musculoskeletal: No rib fracture.  No acute osseous abnormality  Review of the MIP images confirms the above findings.  IMPRESSION: 1. No evidence acute pulmonary embolism. 2. No acute cardiopulmonary findings. 3. Emphysematous change in the lungs. 4. Atherosclerotic calcification aorta and coronary arteries. 5. Mild basilar atelectasis and mild bronchiectasis.   Electronically Signed   By: Genevive BiStewart  Edmunds M.D.   On: 04/06/2016 17:51    CT is negative we'll let the patient  finished his fluid and discharge him with some Pepcid.   Arnaldo NatalPaul F Malinda, MD 04/06/16 [phone]

## 2016-04-06 NOTE — Care Management (Signed)
[facility]. I have [facility] [place] [facility].

## 2016-04-06 NOTE — ED Provider Notes (Signed)
[facility] Emergency Department Provider Note   ____________________________________________   I have reviewed the triage vital signs and the nursing notes.   HISTORY  Chief Complaint Chest Pain and Shortness of Breath   History limited by: Poor historian, some history obtained from daughter   HPI Daniel Dickson is a 82 y.o. male who presents to the emergency department today is of concerns for chest pain. He stated it started this morning. It is located in the center chest. It has been constant since it started. He rates it an 8 or 9 out of a 10 scale. The patient states he has had similar pain a number of times over the past few months. Per daughter he's been evaluated for this and was told he had indigestion. In addition the patient had been diagnosed with pneumonia couple of months ago. The patient states that he also has had wheezing and shortness of breath for 6 months. Patient tried a neb treatment at home without any significant relief.   Past Medical History:  Diagnosis Date  . Anxiety   . COPD (chronic obstructive pulmonary disease) (HCC)   . Coronary artery disease    a. 2012 s/p MI/cardiac arrest--> PCI mRCA.  Marland Kitchen Hypertensive heart disease   . Mixed hyperlipidemia   . PTSD (post-traumatic stress disorder)    with headaches    Patient Active Problem List   Diagnosis Date Noted  . Acute respiratory failure (HCC) 02/18/2016  . Urinary retention 11/10/2015  . Hyperkalemia 11/02/2015  . Hypotension 11/02/2015  . Dysphagia, pharyngoesophageal phase 11/02/2015  . Esophageal spasm 11/02/2015  . Constipation 11/02/2015  . SIADH (syndrome of inappropriate ADH production) (HCC) 11/02/2015  . Urinary retention due to benign prostatic hyperplasia 11/02/2015  . Chronic indwelling Foley catheter 11/02/2015  . Bacteriuria with pyuria 11/02/2015  . Protein-calorie malnutrition, severe 10/30/2015  . ARF (acute renal failure) (HCC) 10/29/2015  .  Dyspnea 10/07/2015  . Hyponatremia 06/06/2015  . Coronary artery disease   . Hypertensive heart disease   . Mixed hyperlipidemia   . COPD (chronic obstructive pulmonary disease) (HCC)   . COPD exacerbation (HCC) 01/11/2015  . Moderate COPD (chronic obstructive pulmonary disease) (HCC) 12/08/2014  . Fall 09/03/2014  . Hip fracture (HCC) 09/03/2014  . PTSD (post-traumatic stress disorder) 09/03/2014  . Bilateral leg edema 09/03/2014  . SOB (shortness of breath) 10/31/2012  . Essential hypertension 10/31/2012  . CAD (coronary artery disease) 10/31/2012  . Hyperlipidemia 10/31/2012  . Tachycardia 10/31/2012    Past Surgical History:  Procedure Laterality Date  . CATARACT EXTRACTION    . CHOLECYSTECTOMY    . CORONARY ANGIOPLASTY     with stent; [facility]  . HAND SURGERY    . HEMORRHOID SURGERY    . HIP SURGERY    . VASCULAR SURGERY     stent R femoral artery    Prior to Admission medications   Medication Sig Start Date End Date Taking? Authorizing Provider  acetaminophen (TYLENOL) 500 MG tablet Take 500 mg by mouth daily as needed for mild pain or moderate pain.     Historical Provider, MD  albuterol (PROAIR HFA) 108 (90 BASE) MCG/ACT inhaler Inhale 2 puffs into the lungs every 6 (six) hours as needed. For wheezing.    Historical Provider, MD  albuterol (PROVENTIL) (2.5 MG/3ML) 0.083% nebulizer solution Take 3 mLs (2.5 mg total) by nebulization every 8 (eight) hours as needed for wheezing or shortness of breath. 10/09/15   Adrian Saran, MD  aspirin EC  81 MG tablet Take 81 mg by mouth daily.    Historical Provider, MD  benzonatate (TESSALON) 200 MG capsule Take 200 mg by mouth 3 (three) times daily as needed for cough.    Historical Provider, MD  Cholecalciferol (VITAMIN D3) 1000 UNITS CAPS Take 1,000 Units by mouth daily.    Historical Provider, MD  feeding supplement (BOOST / RESOURCE BREEZE) LIQD Take 1 Container by mouth 3 (three) times daily between meals. 11/02/15   Katharina Caper, MD   finasteride (PROSCAR) 5 MG tablet Take 1 tablet (5 mg total) by mouth daily. 10/20/15 10/19/16  Nita Sickle, MD  fluticasone furoate-vilanterol (BREO ELLIPTA) 100-25 MCG/INH AEPB Inhale 1 puff into the lungs daily. Reported on 10/19/2015    Historical Provider, MD  isosorbide mononitrate (IMDUR) 30 MG 24 hr tablet Take 1 tablet (30 mg total) by mouth daily. 11/02/15   Katharina Caper, MD  levofloxacin (LEVAQUIN) 500 MG tablet Take 1 tablet (500 mg total) by mouth daily. 02/22/16   Srikar Sudini, MD  loratadine (CLARITIN) 10 MG tablet Take 10 mg by mouth daily.    Historical Provider, MD  magnesium hydroxide (MILK OF MAGNESIA) 400 MG/5ML suspension Take 30 mLs by mouth 2 (two) times daily as needed for mild constipation or moderate constipation. 11/02/15   Katharina Caper, MD  Melatonin 5 MG CAPS Take 10 mg by mouth at bedtime.    Historical Provider, MD  metoprolol succinate (TOPROL XL) 25 MG 24 hr tablet Take 1 tablet (25 mg total) by mouth daily. 02/22/16   Srikar Sudini, MD  metroNIDAZOLE (FLAGYL) 500 MG tablet Take 1 tablet (500 mg total) by mouth 3 (three) times daily. 02/22/16   Milagros Loll, MD  mupirocin cream (BACTROBAN) 2 % Apply topically daily. 11/02/15   Katharina Caper, MD  nitroGLYCERIN (NITROSTAT) 0.4 MG SL tablet Place 0.4 mg under the tongue every 5 (five) minutes as needed for chest pain. Reported on 10/19/2015    Historical Provider, MD  ondansetron (ZOFRAN) 4 MG tablet Take 1 tablet (4 mg total) by mouth every 6 (six) hours as needed for nausea. 11/02/15   Katharina Caper, MD  pantoprazole (PROTONIX) 40 MG tablet Take 1 tablet (40 mg total) by mouth daily. 11/02/15   Katharina Caper, MD  pravastatin (PRAVACHOL) 80 MG tablet Take 80 mg by mouth daily.    Historical Provider, MD  tamsulosin (FLOMAX) 0.4 MG CAPS capsule Take 0.4 mg by mouth daily. Take 30 minutes after same meal each day.    Historical Provider, MD  tiotropium (SPIRIVA) 18 MCG inhalation capsule Place 1 capsule (18 mcg total)  into inhaler and inhale every evening. 10/09/15 10/08/16  Adrian Saran, MD    Allergies Cortisone; Dye fdc red [red dye]; Iodine (kelp)  [iodine]; Peanut oil; Peanut-containing drug products; and Penicillins  Family History  Problem Relation Age of Onset  . COPD Father     Social History Social History  Substance Use Topics  . Smoking status: Former Smoker    Packs/day: 3.00    Years: 40.00    Types: Cigarettes  . Smokeless tobacco: Never Used  . Alcohol use Yes     Comment: wine at night.     Review of Systems  Constitutional: Negative for fever. Cardiovascular: Positive for chest pain. Respiratory: Positive for shortness of breath. Gastrointestinal: Negative for abdominal pain, vomiting and diarrhea. Genitourinary: Negative for dysuria. Musculoskeletal: Negative for back pain. Skin: Negative for rash. Neurological: Negative for headaches, focal weakness or numbness.  10-point ROS otherwise  negative.  ____________________________________________   PHYSICAL EXAM:  VITAL SIGNS: ED Triage Vitals  Enc Vitals Group     BP 04/06/16 1236 (!) 184/96     Pulse Rate 04/06/16 1236 (!) 104     Resp 04/06/16 1236 (!) 24     Temp 04/06/16 1236 99.4 F (37.4 C)     Temp Source 04/06/16 1236 Oral     SpO2 04/06/16 1236 94 %     Weight 04/06/16 1236 126 lb (57.2 kg)     Height 04/06/16 1236 5\' 6"  (1.676 m)     Head Circumference --      Peak Flow --      Pain Score 04/06/16 1237 8    Constitutional: Alert and oriented. Well appearing and in no distress. Eyes: Conjunctivae are normal. Normal extraocular movements. ENT   Head: Normocephalic and atraumatic.   Nose: No congestion/rhinnorhea.   Mouth/Throat: Mucous membranes are moist.   Neck: No stridor. Hematological/Lymphatic/Immunilogical: No cervical lymphadenopathy. Cardiovascular: Normal rate, regular rhythm.  No murmurs, rubs, or gallops.  Respiratory: Normal respiratory effort without tachypnea nor  retractions. Some wheezing noted diffusely. Gastrointestinal: Soft and non tender. No rebound. No guarding.  Genitourinary: Deferred Musculoskeletal: Normal range of motion in all extremities. No lower extremity edema. Neurologic:  Normal speech and language. No gross focal neurologic deficits are appreciated.  Skin:  Skin is warm, dry and intact. No rash noted. Psychiatric: Mood and affect are normal. Speech and behavior are normal. Patient exhibits appropriate insight and judgment.  ____________________________________________    LABS (pertinent positives/negatives)  Labs Reviewed  CBC WITH DIFFERENTIAL/PLATELET - Abnormal; Notable for the following:       Result Value   RBC 4.15 (*)    HCT 39.1 (*)    RDW 16.4 (*)    Neutro Abs 7.4 (*)    All other components within normal limits  COMPREHENSIVE METABOLIC PANEL - Abnormal; Notable for the following:    Sodium 133 (*)    Chloride 99 (*)    Glucose, Bld 112 (*)    BUN 22 (*)    GFR calc non Af Amer 49 (*)    GFR calc Af Amer 57 (*)    All other components within normal limits  TROPONIN I  TROPONIN I    2nd trop pending  ____________________________________________   EKG  Lurline IdolI, Tiyon Sanor, attending physician, personally viewed and interpreted this EKG  EKG Time: 1237 Rate: 103 Rhythm: atrial fibrillation Axis: normal Intervals: qtc 434 QRS: narrow ST changes: no st elevation, t wave inversion V2-V6 Impression: abnormal ekg   ____________________________________________    RADIOLOGY  CXR IMPRESSION: Chronic lung disease with bibasilar scarring. Improvement in bibasilar airspace disease since the prior study. Minimal left effusion.   ____________________________________________   PROCEDURES  Procedures  ____________________________________________   INITIAL IMPRESSION / ASSESSMENT AND PLAN / ED COURSE  Pertinent labs & imaging results that were available during my care of the patient were  reviewed by me and considered in my medical decision making (see chart for details).  Patient presented to the emergency department today with primary complaint of chest pain. It sounds like this is somewhat chronic in nature and that it comes and goes over the course of months. The daughter who stated that he had been diagnosed with indigestion. On review of recent admission note patient was admitted for pneumonia. Had some elevated troponin at that time. Was thought to be secondary to the infection. I did discussion with the patient. Initial  troponin negative here. He was willing to stay for a second troponin. The patient however stated even if was positive he would not be interested in any intervention. He did feel slightly improved after the GI cocktail so that would point to possibly indigestion is constant chest pain. Patient was also given a DuoNeb for shortness breath although he says that that has been chronic and then he has had wheezing chronically past 6 months.  ____________________________________________   FINAL CLINICAL IMPRESSION(S) / ED DIAGNOSES  Final diagnoses:  Chest pain, unspecified type     Note: This dictation was prepared with Dragon dictation. Any transcriptional errors that result from this process are unintentional     Phineas SemenGraydon Edgard Debord, MD 04/06/16 1505

## 2016-04-06 NOTE — Discharge Instructions (Addendum)
Please seek medical attention for any high fevers, chest pain, shortness of breath, change in behavior, persistent vomiting, bloody stool or any other new or concerning symptoms. Please follow-up with your doctor in the next few days. He can try some Pepcid 1 pill in the evening to see if that helps.  The chest pain may be from esophageal spasm. If Maalox and the Pepcid don't help his possible which her doctor might want to try some long acting nitroglycerin or some other medication.

## 2016-04-06 NOTE — Care Management Note (Addendum)
Case Management Note  Patient Details  Name: Daniel Dickson MRN: [id number] Date of Birth: 1912/06/22  Subjective/Objective:                   Spoke with patient's daughter by phone Daniel Dickson [phone]) whom this RNCM met with on previous admission. Patient is from home alone where she states has been doing well. Daniel Dickson lives next door to him- she states she told him to call EMS. He is not on home O2. He is affiliated with [facility] and in the past has declined to transfer. His daughter suggests that he will decline this visit too however she says he can make that decision. She states he has a chronic urinary Foley in place cared for patient and home health agency [facility]. Per Daniel Dickson- he has PT and nursing through [facility] at this time (which is not correct). [facility] an [facility] aid to assist with bathing and light house keeping 5 days per week. He walks with a walker and depends on Daniel Dickson for transportation. He is current with PCP Dr. Clemmie Dickson in [place].  Action/Plan: [facility] notified via VM of patient coming to ED- this RNCM has not completed [facility] forms due to discharge planning pending. I have left message for Daniel Dickson with [facility] to notify of patient being in the hospital and to verify services provided.RNCM will continue to follow. Received callback from [facility] and they are not following patient at this time. I am checking with [facility]-he's not open to them either.  Waiting for [facility] to callback with update. Spoke with [facility] and they are not sure what home health agency nurse gave urinary cath orders to on 03/18/16 as it is not documented in their records.   Expected Discharge Date:                  Expected Discharge Plan:     In-House Referral:     Discharge planning Services  CM Consult  Post Acute Care Choice:  Home Health Choice offered to:  Adult Children  DME Arranged:    DME Agency:     HH Arranged:   RN, PT [place] Agency:  [place]  Status of Service:  In process, will continue to follow  If discussed at Long Length of Stay Meetings, dates discussed:    Additional Comments:  Daniel Garfinkel, RN 04/06/2016, 2:20 PM

## 2016-04-06 NOTE — Care Management (Signed)
This RNCM [facility]. Please call RNCM [phone] for assistance (if needed and patient agrees) with transfer to [facility].

## 2016-04-06 NOTE — ED Triage Notes (Signed)
Patient arrived by EMS from home for Chest pressure that started today and SOB. PAtient does not wear O2 at home. Patient has been taking ABX X2 weeks for pneumonia. Purse lip breathing and audible wheezes noted. EMS gave X2 duonebs. EMS vitals 12 lead A-Fib, 170/100 b/p, HR 90s. PAtient has Foley in place from home that he states is suppose to be changed tomorrow. A&O x4

## 2016-04-07 NOTE — Care Management (Signed)
Post discharge from ED 04/06/17. [facility].

## 2016-05-12 ENCOUNTER — Ambulatory Visit: Payer: Medicare PPO

## 2016-05-21 ENCOUNTER — Encounter: Payer: Self-pay | Admitting: Emergency Medicine

## 2016-05-21 ENCOUNTER — Emergency Department
Admission: EM | Admit: 2016-05-21 | Discharge: 2016-05-21 | Disposition: A | Discharge status: Home / Self Care | Payer: Non-veteran care | Attending: Emergency Medicine | Admitting: Emergency Medicine

## 2016-05-21 DIAGNOSIS — Z79899 Other long term (current) drug therapy: Secondary | ICD-10-CM | POA: Insufficient documentation

## 2016-05-21 DIAGNOSIS — Z87891 Personal history of nicotine dependence: Secondary | ICD-10-CM | POA: Diagnosis not present

## 2016-05-21 DIAGNOSIS — I2510 Atherosclerotic heart disease of native coronary artery without angina pectoris: Secondary | ICD-10-CM | POA: Insufficient documentation

## 2016-05-21 DIAGNOSIS — J441 Chronic obstructive pulmonary disease with (acute) exacerbation: Secondary | ICD-10-CM | POA: Diagnosis not present

## 2016-05-21 DIAGNOSIS — T83518A Infection and inflammatory reaction due to other urinary catheter, initial encounter: Secondary | ICD-10-CM | POA: Insufficient documentation

## 2016-05-21 DIAGNOSIS — T83511A Infection and inflammatory reaction due to indwelling urethral catheter, initial encounter: Secondary | ICD-10-CM

## 2016-05-21 DIAGNOSIS — T839XXA Unspecified complication of genitourinary prosthetic device, implant and graft, initial encounter: Secondary | ICD-10-CM

## 2016-05-21 DIAGNOSIS — Y828 Other medical devices associated with adverse incidents: Secondary | ICD-10-CM | POA: Diagnosis not present

## 2016-05-21 DIAGNOSIS — Z7982 Long term (current) use of aspirin: Secondary | ICD-10-CM | POA: Diagnosis not present

## 2016-05-21 DIAGNOSIS — N390 Urinary tract infection, site not specified: Secondary | ICD-10-CM

## 2016-05-21 DIAGNOSIS — R339 Retention of urine, unspecified: Secondary | ICD-10-CM | POA: Diagnosis present

## 2016-05-21 DIAGNOSIS — I119 Hypertensive heart disease without heart failure: Secondary | ICD-10-CM | POA: Diagnosis not present

## 2016-05-21 LAB — URINALYSIS, COMPLETE (UACMP) WITH MICROSCOPIC
BILIRUBIN URINE: NEGATIVE
Glucose, UA: NEGATIVE mg/dL
KETONES UR: NEGATIVE mg/dL
Nitrite: POSITIVE — AB
PH: 8 (ref 5.0–8.0)
Protein, ur: 30 mg/dL — AB
SQUAMOUS EPITHELIAL / LPF: NONE SEEN
Specific Gravity, Urine: 1.009 (ref 1.005–1.030)

## 2016-05-21 MED ORDER — ALBUTEROL SULFATE (2.5 MG/3ML) 0.083% IN NEBU
INHALATION_SOLUTION | RESPIRATORY_TRACT | Status: AC
  Filled 2016-05-21: qty 3

## 2016-05-21 MED ORDER — LEVOFLOXACIN 750 MG PO TABS: 750.0000 mg | ORAL_TABLET | Freq: Every day | ORAL | 0 refills | Status: AC

## 2016-05-21 MED ORDER — ACETAMINOPHEN 500 MG PO TABS: 1000.0000 mg | ORAL_TABLET | Freq: Once | ORAL | Status: DC

## 2016-05-21 MED ORDER — IPRATROPIUM-ALBUTEROL 0.5-2.5 (3) MG/3ML IN SOLN
RESPIRATORY_TRACT | Status: AC
  Filled 2016-05-21: qty 3

## 2016-05-21 MED ORDER — IPRATROPIUM-ALBUTEROL 0.5-2.5 (3) MG/3ML IN SOLN
3.0000 mL | Freq: Once | RESPIRATORY_TRACT | Status: AC
  Administered 2016-05-21: 3 mL via RESPIRATORY_TRACT

## 2016-05-21 MED ORDER — LIDOCAINE HCL 2 % EX GEL
CUTANEOUS | Status: AC
  Administered 2016-05-21: 1 via URETHRAL
  Filled 2016-05-21: qty 10

## 2016-05-21 MED ORDER — CEFTRIAXONE SODIUM 1 G IJ SOLR
1.0000 g | Freq: Once | INTRAMUSCULAR | Status: AC
  Administered 2016-05-21: 1 g via INTRAMUSCULAR
  Filled 2016-05-21: qty 10

## 2016-05-21 MED ORDER — LIDOCAINE HCL 2 % EX GEL
1.0000 "application " | Freq: Once | CUTANEOUS | Status: AC
  Administered 2016-05-21: 1 via URETHRAL

## 2016-05-21 NOTE — ED Provider Notes (Signed)
[facility] Emergency Department Provider Note  ____________________________________________  Time seen: Approximately 1:34 AM  I have reviewed the triage vital signs and the nursing notes.   HISTORY  Chief Complaint Urinary Retention (Pt. here via EMS for groan pain.)   HPI Daniel Dickson is a 83 y.o. male the history of BPH with an indwelling Foley catheter who presents for evaluation of urinary retention and penile pain. Patient has had the Foley catheter since August 2017 when he was admitted with a COPD exacerbation. Had a trial of void a few weeks later with urology and failed and has been with the Foley catheter ever since. Patient says that he's been one in the Foley catheter to be removed and does not understand why the Foley catheter has been in place for so many months. He reports that he was doing well until 11:30 PM when he started having pain in his penis that he describes as moderate, sharp, constant and noticed that his Foley catheter stopped draining urine. He denies abdominal pain, nausea vomiting, fever or chills, chest pain or shortness of breath.  Past Medical History:  Diagnosis Date  . Anxiety   . COPD (chronic obstructive pulmonary disease) (HCC)   . Coronary artery disease    a. 2012 s/p MI/cardiac arrest--> PCI mRCA.  Marland Kitchen Hypertensive heart disease   . Mixed hyperlipidemia   . PTSD (post-traumatic stress disorder)    with headaches    Patient Active Problem List   Diagnosis Date Noted  . Acute respiratory failure (HCC) 02/18/2016  . Urinary retention 11/10/2015  . Hyperkalemia 11/02/2015  . Hypotension 11/02/2015  . Dysphagia, pharyngoesophageal phase 11/02/2015  . Esophageal spasm 11/02/2015  . Constipation 11/02/2015  . SIADH (syndrome of inappropriate ADH production) (HCC) 11/02/2015  . Urinary retention due to benign prostatic hyperplasia 11/02/2015  . Chronic indwelling Foley catheter 11/02/2015  . Bacteriuria with  pyuria 11/02/2015  . Protein-calorie malnutrition, severe 10/30/2015  . ARF (acute renal failure) (HCC) 10/29/2015  . Dyspnea 10/07/2015  . Hyponatremia 06/06/2015  . Coronary artery disease   . Hypertensive heart disease   . Mixed hyperlipidemia   . COPD (chronic obstructive pulmonary disease) (HCC)   . COPD exacerbation (HCC) 01/11/2015  . Moderate COPD (chronic obstructive pulmonary disease) (HCC) 12/08/2014  . Fall 09/03/2014  . Hip fracture (HCC) 09/03/2014  . PTSD (post-traumatic stress disorder) 09/03/2014  . Bilateral leg edema 09/03/2014  . SOB (shortness of breath) 10/31/2012  . Essential hypertension 10/31/2012  . CAD (coronary artery disease) 10/31/2012  . Hyperlipidemia 10/31/2012  . Tachycardia 10/31/2012    Past Surgical History:  Procedure Laterality Date  . CATARACT EXTRACTION    . CHOLECYSTECTOMY    . CORONARY ANGIOPLASTY     with stent; [facility]  . HAND SURGERY    . HEMORRHOID SURGERY    . HIP SURGERY    . VASCULAR SURGERY     stent R femoral artery    Prior to Admission medications   Medication Sig Start Date End Date Taking? Authorizing Provider  acetaminophen (TYLENOL) 500 MG tablet Take 500 mg by mouth daily as needed for mild pain or moderate pain.     Historical Provider, MD  albuterol (PROAIR HFA) 108 (90 BASE) MCG/ACT inhaler Inhale 2 puffs into the lungs every 6 (six) hours as needed. For wheezing.    Historical Provider, MD  albuterol (PROVENTIL) (2.5 MG/3ML) 0.083% nebulizer solution Take 3 mLs (2.5 mg total) by nebulization every 8 (eight) hours as  needed for wheezing or shortness of breath. 10/09/15   Adrian Saran, MD  antiseptic oral rinse (BIOTENE) LIQD 15 mLs by Mouth Rinse route as needed for dry mouth.    Historical Provider, MD  aspirin EC 81 MG tablet Take 81 mg by mouth daily.    Historical Provider, MD  benzonatate (TESSALON PERLES) 100 MG capsule Take 1 capsule (100 mg total) by mouth every 6 (six) hours as needed for cough. 04/06/16  04/06/17  Phineas Semen, MD  Cholecalciferol (VITAMIN D3) 1000 UNITS CAPS Take 1,000 Units by mouth daily.    Historical Provider, MD  famotidine (PEPCID) 40 MG tablet Take 1 tablet (40 mg total) by mouth every evening. 04/06/16 04/06/17  Arnaldo Natal, MD  feeding supplement (BOOST / RESOURCE BREEZE) LIQD Take 1 Container by mouth 3 (three) times daily between meals. 11/02/15   Katharina Caper, MD  finasteride (PROSCAR) 5 MG tablet Take 1 tablet (5 mg total) by mouth daily. 10/20/15 10/19/16  Nita Sickle, MD  fluticasone furoate-vilanterol (BREO ELLIPTA) 100-25 MCG/INH AEPB Inhale 1 puff into the lungs daily. Reported on 10/19/2015    Historical Provider, MD  isosorbide mononitrate (IMDUR) 30 MG 24 hr tablet Take 1 tablet (30 mg total) by mouth daily. 11/02/15   Katharina Caper, MD  levofloxacin (LEVAQUIN) 750 MG tablet Take 1 tablet (750 mg total) by mouth daily. 05/21/16 05/28/16  Nita Sickle, MD  loratadine (CLARITIN) 10 MG tablet Take 10 mg by mouth daily.    Historical Provider, MD  magnesium hydroxide (MILK OF MAGNESIA) 400 MG/5ML suspension Take 30 mLs by mouth 2 (two) times daily as needed for mild constipation or moderate constipation. 11/02/15   Katharina Caper, MD  Melatonin 5 MG CAPS Take 10 mg by mouth at bedtime.    Historical Provider, MD  metoprolol succinate (TOPROL XL) 25 MG 24 hr tablet Take 1 tablet (25 mg total) by mouth daily. 02/22/16   Milagros Loll, MD  mupirocin cream (BACTROBAN) 2 % Apply topically daily. 11/02/15   Katharina Caper, MD  nitroGLYCERIN (NITROSTAT) 0.4 MG SL tablet Place 0.4 mg under the tongue every 5 (five) minutes as needed for chest pain. Reported on 10/19/2015    Historical Provider, MD  ondansetron (ZOFRAN) 4 MG tablet Take 1 tablet (4 mg total) by mouth every 6 (six) hours as needed for nausea. 11/02/15   Katharina Caper, MD  pantoprazole (PROTONIX) 40 MG tablet Take 1 tablet (40 mg total) by mouth daily. 11/02/15   Katharina Caper, MD  phenazopyridine  (PYRIDIUM) 100 MG tablet Take 100 mg by mouth 3 (three) times daily with meals.    Historical Provider, MD  tiotropium (SPIRIVA) 18 MCG inhalation capsule Place 1 capsule (18 mcg total) into inhaler and inhale every evening. 10/09/15 10/08/16  Adrian Saran, MD    Allergies Cortisone; Dye fdc red [red dye]; Iodine (kelp)  [iodine]; Peanut oil; Peanut-containing drug products; and Penicillins  Family History  Problem Relation Age of Onset  . COPD Father     Social History Social History  Substance Use Topics  . Smoking status: Former Smoker    Packs/day: 3.00    Years: 40.00    Types: Cigarettes  . Smokeless tobacco: Never Used  . Alcohol use Yes     Comment: wine at night.     Review of Systems  Constitutional: Negative for fever. Eyes: Negative for visual changes. ENT: Negative for sore throat. Neck: No neck pain  Cardiovascular: Negative for chest pain. Respiratory: Negative for shortness  of breath. Gastrointestinal: Negative for abdominal pain, vomiting or diarrhea. Genitourinary: Negative for dysuria. + penile pain and urinary retention Musculoskeletal: Negative for back pain. Skin: Negative for rash. Neurological: Negative for headaches, weakness or numbness. Psych: No SI or HI  ____________________________________________   PHYSICAL EXAM:  VITAL SIGNS: ED Triage Vitals [05/21/16 0122]  Enc Vitals Group     BP (!) 165/103     Pulse Rate (!) 109     Resp 18     Temp 98.1 F (36.7 C)     Temp Source Oral     SpO2 100 %     Weight 124 lb (56.2 kg)     Height 5\' 6"  (1.676 m)     Head Circumference      Peak Flow      Pain Score 8     Pain Loc      Pain Edu?      Excl. in GC?     Constitutional: Alert and oriented. Well appearing and in no apparent distress. HEENT:      Head: Normocephalic and atraumatic.         Eyes: Conjunctivae are normal. Sclera is non-icteric. EOMI. PERRL      Mouth/Throat: Mucous membranes are moist.       Neck: Supple with no  signs of meningismus. Cardiovascular: Regular rate and rhythm. No murmurs, gallops, or rubs. 2+ symmetrical distal pulses are present in all extremities. No JVD. Respiratory: Normal respiratory effort. Lungs are clear to auscultation bilaterally. No wheezes, crackles, or rhonchi.  Gastrointestinal: Soft, non tender, and non distended with positive bowel sounds. No rebound or guarding. Genitourinary: No CVA tenderness. Foley in place. Bilateral testicles are descended with no tenderness to palpation, bilateral positive cremasteric reflexes are present, no swelling or erythema of the scrotum. No evidence of inguinal hernia. Musculoskeletal: Nontender with normal range of motion in all extremities. No edema, cyanosis, or erythema of extremities. Neurologic: Normal speech and language. Face is symmetric. Moving all extremities. No gross focal neurologic deficits are appreciated. Skin: Skin is warm, dry and intact. No rash noted. Psychiatric: Mood and affect are normal. Speech and behavior are normal.  ____________________________________________   LABS (all labs ordered are listed, but only abnormal results are displayed)  Labs Reviewed  URINALYSIS, COMPLETE (UACMP) WITH MICROSCOPIC - Abnormal; Notable for the following:       Result Value   Color, Urine YELLOW (*)    APPearance CLOUDY (*)    Hgb urine dipstick LARGE (*)    Protein, ur 30 (*)    Nitrite POSITIVE (*)    Leukocytes, UA LARGE (*)    Bacteria, UA MANY (*)    All other components within normal limits  URINE CULTURE   ____________________________________________  EKG  none  ____________________________________________  RADIOLOGY  none  ____________________________________________   PROCEDURES  Procedure(s) performed: None Procedures Critical Care performed:  None ____________________________________________   INITIAL IMPRESSION / ASSESSMENT AND PLAN / ED COURSE  83 y.o. male the history of BPH with an  indwelling Foley catheter who presents for evaluation of urinary retention and penile pain since 11:30PM. GU exam normal, no abdominal ttp. Patient is extremely well appearing. Bladder scan showing 340cc. Will perform TOV and if patient fails will replace foley catheter. Will check UA for UTI. Will give tylenol for penile pain.  Clinical Course as of May 22 419  Sat May 21, 2016  0335 Patient fail tried to void. New Foley catheter was placed. Vital signs normalize  once patient's pain was well controlled. His pulse improved, blood pressure is back to his normal baseline. Remains afebrile. UA is concerning for urinary tract infection with nitrites, many bacteria, too numerous to count RBCs and WBCs. No signs of sepsis. Patient was given a dose of IM ceftriaxone and is going to be discharged home on levaquin. Although this medication has multiple black box warning and I seldomly use it for any disease, I do believe that at this time there is no better choice since this is a catheter associated infection and sending home on a PO cephalosporin is not enough coverage. Patient is covered for 24 hours. Will tell him to hold off PO abx for 24 hours and I will follow culture and once sensitivities as resulted will adjusted abx as needed. Recommended close f/u with his PCP on Monday for recheck.  [CV]    Clinical Course User Index [CV] Nita Sicklearolina Asta Corbridge, MD    Pertinent labs & imaging results that were available during my care of the patient were reviewed by me and considered in my medical decision making (see chart for details).    ____________________________________________   FINAL CLINICAL IMPRESSION(S) / ED DIAGNOSES  Final diagnoses:  Urinary retention  Complication of Foley catheter, initial encounter (HCC)  Urinary tract infection associated with indwelling urethral catheter, initial encounter (HCC)      NEW MEDICATIONS STARTED DURING THIS VISIT:  New Prescriptions   LEVOFLOXACIN  (LEVAQUIN) 750 MG TABLET    Take 1 tablet (750 mg total) by mouth daily.     Note:  This document was prepared using Dragon voice recognition software and may include unintentional dictation errors.    Nita Sicklearolina Jesslynn Kruck, MD 05/21/16 [phone]

## 2016-05-21 NOTE — ED Notes (Signed)
Pt. States he has had a foley catheter for the past year.  Pt. States it is changed once a month.  Pt. States he drains it in the evenings.  Pt. Is unsure why catheter was placed.  Pt. States pain is at his penis.

## 2016-05-21 NOTE — ED Triage Notes (Signed)
Pt. States groan pain that started at around 23:30 tonight.  Pt. Has foley catheter and states he has not seen draining from foley.

## 2016-05-21 NOTE — ED Notes (Signed)
Urinary catheter came out with no difficulty.  10 cc bulb deflated.

## 2016-05-24 LAB — URINE CULTURE: Culture: >=100000 — AB

## 2016-07-26 NOTE — Progress Notes (Signed)
07/27/2016 10:37 AM   Daniel Dickson March 05, 1913 [id number]  Referring provider: [name], MD [address] [place], [place] [address]  Chief Complaint  Patient presents with  . Other    pain in groin and penis last seen 11/2015    HPI: 83 yo WM with a history of urinary retention disorder who presents today for follow up after being seen in the ED for penile and groin pain with his daughter, Daniel Dickson.    Daniel Dickson presented to the ED on 05/21/2016  for evaluation of urinary retention and penile pain.  He reported that he was doing well until 11:30 PM when he started having pain in his penis that he describes as moderate, sharp, constant and noticed that his Foley catheter stopped draining urine. He denied abdominal pain, nausea vomiting, fever or chills, chest pain or shortness of breath.   Patient has had three Foley changes over the last week due to penile pain and bladder spasms.  He has been Pyridium for the last week in an effort to control the bladder spasms.  The daughter states that his Foley has been draining well.    He has not had any blood in his urine.   PMH: Past Medical History:  Diagnosis Date  . Anxiety   . COPD (chronic obstructive pulmonary disease) (HCC)   . Coronary artery disease    a. 2012 s/p MI/cardiac arrest--> PCI mRCA.  Marland Kitchen Hypertensive heart disease   . Mixed hyperlipidemia   . PTSD (post-traumatic stress disorder)    with headaches    Surgical History: Past Surgical History:  Procedure Laterality Date  . CATARACT EXTRACTION    . CHOLECYSTECTOMY    . CORONARY ANGIOPLASTY     with stent; [facility]  . HAND SURGERY    . HEMORRHOID SURGERY    . HIP SURGERY    . VASCULAR SURGERY     stent R femoral artery    Home Medications:  Allergies as of 07/27/2016      Reactions   Cortisone Other (See Comments)   Other reaction(s): Other (See Comments) GI Upset Unknown reaction   Dye Fdc Red [red Dye] Other (See Comments)   Unknown reaction.   Iodine (kelp)  [iodine]    Other reaction(s): Other (See Comments) GI Upset   Peanut Oil    Other reaction(s): Unknown   Peanut-containing Drug Products    Other reaction(s): Unknown   Penicillins Rash   GI Upset patient not sure of reaction      Medication List       Accurate as of 07/27/16 10:37 AM. Always use your most recent med list.          acetaminophen 500 MG tablet Commonly known as:  TYLENOL Take 500 mg by mouth daily as needed for mild pain or moderate pain.   antiseptic oral rinse Liqd 15 mLs by Mouth Rinse route as needed for dry mouth.   aspirin EC 81 MG tablet Take 81 mg by mouth daily.   benzonatate 100 MG capsule Commonly known as:  TESSALON PERLES Take 1 capsule (100 mg total) by mouth every 6 (six) hours as needed for cough.   BREO ELLIPTA 100-25 MCG/INH Aepb Generic drug:  fluticasone furoate-vilanterol Inhale 1 puff into the lungs daily. Reported on 10/19/2015   famotidine 40 MG tablet Commonly known as:  PEPCID Take 1 tablet (40 mg total) by mouth every evening.   feeding supplement Liqd Take 1 Container by mouth 3 (  three) times daily between meals.   finasteride 5 MG tablet Commonly known as:  PROSCAR Take 1 tablet (5 mg total) by mouth daily.   isosorbide mononitrate 30 MG 24 hr tablet Commonly known as:  IMDUR Take 1 tablet (30 mg total) by mouth daily.   loratadine 10 MG tablet Commonly known as:  CLARITIN Take 10 mg by mouth daily.   magnesium hydroxide 400 MG/5ML suspension Commonly known as:  MILK OF MAGNESIA Take 30 mLs by mouth 2 (two) times daily as needed for mild constipation or moderate constipation.   Melatonin 5 MG Caps Take 10 mg by mouth at bedtime.   metoprolol succinate 25 MG 24 hr tablet Commonly known as:  TOPROL XL Take 1 tablet (25 mg total) by mouth daily.   mupirocin cream 2 % Commonly known as:  BACTROBAN Apply topically daily.   nitroGLYCERIN 0.4 MG SL tablet Commonly known as:  NITROSTAT Place  0.4 mg under the tongue every 5 (five) minutes as needed for chest pain. Reported on 10/19/2015   ondansetron 4 MG tablet Commonly known as:  ZOFRAN Take 1 tablet (4 mg total) by mouth every 6 (six) hours as needed for nausea.   oxybutynin 5 MG 24 hr tablet Commonly known as:  DITROPAN-XL Take as needed for bladder spasms up to three times a day   pantoprazole 40 MG tablet Commonly known as:  PROTONIX Take 1 tablet (40 mg total) by mouth daily.   phenazopyridine 100 MG tablet Commonly known as:  PYRIDIUM Take 100 mg by mouth 3 (three) times daily with meals.   PROAIR HFA 108 (90 Base) MCG/ACT inhaler Generic drug:  albuterol Inhale 2 puffs into the lungs every 6 (six) hours as needed. For wheezing.   albuterol (2.5 MG/3ML) 0.083% nebulizer solution Commonly known as:  PROVENTIL Take 3 mLs (2.5 mg total) by nebulization every 8 (eight) hours as needed for wheezing or shortness of breath.   tamsulosin 0.4 MG Caps capsule Commonly known as:  FLOMAX Take by mouth.   tiotropium 18 MCG inhalation capsule Commonly known as:  SPIRIVA Place 1 capsule (18 mcg total) into inhaler and inhale every evening.   Vitamin D3 1000 units Caps Take 1,000 Units by mouth daily.       Allergies:  Allergies  Allergen Reactions  . Cortisone Other (See Comments)    Other reaction(s): Other (See Comments) GI Upset Unknown reaction  . Dye Fdc Red [Red Dye] Other (See Comments)    Unknown reaction.  . Iodine (Kelp)  [Iodine]     Other reaction(s): Other (See Comments) GI Upset  . Peanut Oil     Other reaction(s): Unknown  . Peanut-Containing Drug Products     Other reaction(s): Unknown  . Penicillins Rash    GI Upset patient not sure of reaction    Family History: Family History  Problem Relation Age of Onset  . COPD Father   . Bladder Cancer Neg Hx   . Kidney cancer Neg Hx   . Prostate cancer Neg Hx     Social History:  reports that he has quit smoking. His smoking use  included Cigarettes. He has a 120.00 pack-year smoking history. He has never used smokeless tobacco. He reports that he drinks alcohol. He reports that he does not use drugs.  ROS: UROLOGY Frequent Urination?: No Hard to postpone urination?: No Burning/pain with urination?: No Get up at night to urinate?: No Leakage of urine?: No Urine stream starts and stops?: No Trouble  starting stream?: No Do you have to strain to urinate?: No Blood in urine?: No Urinary tract infection?: No Sexually transmitted disease?: No Injury to kidneys or bladder?: No Painful intercourse?: No Weak stream?: No Erection problems?: No Penile pain?: Yes  Gastrointestinal Nausea?: No Vomiting?: No Indigestion/heartburn?: No Diarrhea?: No Constipation?: No  Constitutional Fever: No Night sweats?: No Weight loss?: No Fatigue?: No  Skin Skin rash/lesions?: No Itching?: No  Eyes Blurred vision?: No Double vision?: No  Ears/Nose/Throat Sore throat?: No Sinus problems?: No  Hematologic/Lymphatic Swollen glands?: No Easy bruising?: No  Cardiovascular Leg swelling?: No Chest pain?: No  Respiratory Cough?: No Shortness of breath?: No  Endocrine Excessive thirst?: No  Musculoskeletal Back pain?: No Joint pain?: No  Neurological Headaches?: No Dizziness?: No  Psychologic Depression?: No Anxiety?: No  Physical Exam: BP 122/87   Pulse 82   Ht  (1.676 m)   Wt 126 lb 1.6 oz (57.2 kg)   BMI 20.35 kg/m   Constitutional: Well nourished. Alert and oriented, No acute distress. HEENT: [place] AT, moist mucus membranes. Trachea midline, no masses. Cardiovascular: No clubbing, cyanosis, or edema. Respiratory: Normal respiratory effort, no increased work of breathing. GI: Abdomen is soft, non tender, non distended, no abdominal masses. Liver and spleen not palpable.  No hernias appreciated.  Stool sample for occult testing is not indicated.   GU: No CVA tenderness.  No bladder  fullness or masses.  Patient with uncircumcised phallus.  Foreskin easily retracted  Urethral meatus is patent.  No penile discharge. No penile lesions or rashes. Scrotum without lesions, cysts, rashes and/or edema.  Testicles are located scrotally bilaterally. No masses are appreciated in the testicles. Left and right epididymis are normal. Rectal: Deferred. Skin: No rashes, bruises or suspicious lesions. Lymph: No cervical or inguinal adenopathy. Neurologic: Grossly intact, no focal deficits, moving all 4 extremities. Psychiatric: Normal mood and affect.  Laboratory Data: Lab Results  Component Value Date   WBC 10.1 04/06/2016   HGB 13.4 04/06/2016   HCT 39.1 (L) 04/06/2016   MCV 94.3 04/06/2016   PLT 204 04/06/2016    Lab Results  Component Value Date   CREATININE 1.17 04/06/2016     Lab Results  Component Value Date   TSH 4.022 10/07/2015    Lab Results  Component Value Date   AST 34 04/06/2016   Lab Results  Component Value Date   ALT 19 04/06/2016    Pertinent Imaging:   Assessment & Plan:    1. Urinary retention  - managed with indwelling Foley - changed monthly by Home Health  2. Bladder spasms  - given oxybutynin 5 mg tid prn for spasms  - RTC in on improvement    Return if symptoms worsen or fail to improve.  These notes generated with voice recognition software. I apologize for typographical errors.  Michiel Cowboy, PA-C  [facility] [address] [place], [place] [address] [phone]

## 2016-07-27 ENCOUNTER — Encounter: Payer: Self-pay | Admitting: Urology

## 2016-07-27 ENCOUNTER — Ambulatory Visit (INDEPENDENT_AMBULATORY_CARE_PROVIDER_SITE_OTHER): Payer: Medicare PPO | Admitting: Urology

## 2016-07-27 VITALS — BP 122/87 | HR 82 | Ht 66.0 in | Wt 126.1 lb

## 2016-07-27 DIAGNOSIS — N3289 Other specified disorders of bladder: Secondary | ICD-10-CM | POA: Diagnosis not present

## 2016-07-27 DIAGNOSIS — R339 Retention of urine, unspecified: Secondary | ICD-10-CM

## 2016-07-27 MED ORDER — OXYBUTYNIN CHLORIDE ER 5 MG PO TB24: ORAL_TABLET | ORAL | 3 refills | Status: DC

## 2016-08-29 ENCOUNTER — Encounter: Payer: Self-pay | Admitting: *Deleted

## 2016-08-29 ENCOUNTER — Emergency Department: Payer: Non-veteran care

## 2016-08-29 ENCOUNTER — Inpatient Hospital Stay
Admission: EM | Admit: 2016-08-29 | Discharge: 2016-09-02 | DRG: 726 | Disposition: A | Discharge status: Home / Self Care | Payer: Non-veteran care | Attending: Specialist | Admitting: Specialist

## 2016-08-29 DIAGNOSIS — E782 Mixed hyperlipidemia: Secondary | ICD-10-CM | POA: Diagnosis present

## 2016-08-29 DIAGNOSIS — R338 Other retention of urine: Secondary | ICD-10-CM | POA: Diagnosis present

## 2016-08-29 DIAGNOSIS — Z8674 Personal history of sudden cardiac arrest: Secondary | ICD-10-CM

## 2016-08-29 DIAGNOSIS — N401 Enlarged prostate with lower urinary tract symptoms: Secondary | ICD-10-CM | POA: Diagnosis not present

## 2016-08-29 DIAGNOSIS — Z7951 Long term (current) use of inhaled steroids: Secondary | ICD-10-CM

## 2016-08-29 DIAGNOSIS — B965 Pseudomonas (aeruginosa) (mallei) (pseudomallei) as the cause of diseases classified elsewhere: Secondary | ICD-10-CM | POA: Diagnosis present

## 2016-08-29 DIAGNOSIS — F4310 Post-traumatic stress disorder, unspecified: Secondary | ICD-10-CM | POA: Diagnosis present

## 2016-08-29 DIAGNOSIS — R8271 Bacteriuria: Secondary | ICD-10-CM | POA: Diagnosis present

## 2016-08-29 DIAGNOSIS — Z7982 Long term (current) use of aspirin: Secondary | ICD-10-CM

## 2016-08-29 DIAGNOSIS — I119 Hypertensive heart disease without heart failure: Secondary | ICD-10-CM | POA: Diagnosis present

## 2016-08-29 DIAGNOSIS — Z955 Presence of coronary angioplasty implant and graft: Secondary | ICD-10-CM

## 2016-08-29 DIAGNOSIS — Z79899 Other long term (current) drug therapy: Secondary | ICD-10-CM

## 2016-08-29 DIAGNOSIS — Z888 Allergy status to other drugs, medicaments and biological substances status: Secondary | ICD-10-CM

## 2016-08-29 DIAGNOSIS — I2510 Atherosclerotic heart disease of native coronary artery without angina pectoris: Secondary | ICD-10-CM | POA: Diagnosis present

## 2016-08-29 DIAGNOSIS — Z9102 Food additives allergy status: Secondary | ICD-10-CM

## 2016-08-29 DIAGNOSIS — R310 Gross hematuria: Secondary | ICD-10-CM | POA: Diagnosis not present

## 2016-08-29 DIAGNOSIS — I252 Old myocardial infarction: Secondary | ICD-10-CM

## 2016-08-29 DIAGNOSIS — Z87891 Personal history of nicotine dependence: Secondary | ICD-10-CM

## 2016-08-29 DIAGNOSIS — Z91018 Allergy to other foods: Secondary | ICD-10-CM

## 2016-08-29 DIAGNOSIS — K219 Gastro-esophageal reflux disease without esophagitis: Secondary | ICD-10-CM | POA: Diagnosis present

## 2016-08-29 DIAGNOSIS — R339 Retention of urine, unspecified: Secondary | ICD-10-CM | POA: Diagnosis present

## 2016-08-29 DIAGNOSIS — Z880 Allergy status to penicillin: Secondary | ICD-10-CM

## 2016-08-29 DIAGNOSIS — B952 Enterococcus as the cause of diseases classified elsewhere: Secondary | ICD-10-CM | POA: Diagnosis present

## 2016-08-29 DIAGNOSIS — N3289 Other specified disorders of bladder: Secondary | ICD-10-CM | POA: Diagnosis present

## 2016-08-29 DIAGNOSIS — J449 Chronic obstructive pulmonary disease, unspecified: Secondary | ICD-10-CM | POA: Diagnosis present

## 2016-08-29 DIAGNOSIS — Z91010 Allergy to peanuts: Secondary | ICD-10-CM

## 2016-08-29 LAB — URINALYSIS, COMPLETE (UACMP) WITH MICROSCOPIC
Bacteria, UA: NONE SEEN
SPECIFIC GRAVITY, URINE: 1.011 (ref 1.005–1.030)
Squamous Epithelial / LPF: NONE SEEN

## 2016-08-29 LAB — BASIC METABOLIC PANEL
ANION GAP: 7 (ref 5–15)
BUN: 29 mg/dL — ABNORMAL HIGH (ref 6–20)
CALCIUM: 9.7 mg/dL (ref 8.9–10.3)
CO2: 26 mmol/L (ref 22–32)
Chloride: 99 mmol/L — ABNORMAL LOW (ref 101–111)
Creatinine, Ser: 1.07 mg/dL (ref 0.61–1.24)
GFR calc Af Amer: >60 mL/min (ref >60)
GFR calc non Af Amer: 54 mL/min — ABNORMAL LOW (ref >60)
Glucose, Bld: 91 mg/dL (ref 65–99)
Potassium: 4.5 mmol/L (ref 3.5–5.1)
Sodium: 132 mmol/L — ABNORMAL LOW (ref 135–145)

## 2016-08-29 LAB — CBC
HEMATOCRIT: 42.2 % (ref 40.0–52.0)
Hemoglobin: 14.3 g/dL (ref 13.0–18.0)
MCH: 32.8 pg (ref 26.0–34.0)
MCHC: 34 g/dL (ref 32.0–36.0)
MCV: 96.4 fL (ref 80.0–100.0)
PLATELETS: 197 10*3/uL (ref 150–440)
RBC: 4.38 MIL/uL — ABNORMAL LOW (ref 4.40–5.90)
RDW: 15.6 % — AB (ref 11.5–14.5)
WBC: 7.9 10*3/uL (ref 3.8–10.6)

## 2016-08-29 LAB — LACTIC ACID, PLASMA: Lactic Acid, Venous: 1.6 mmol/L (ref 0.5–1.9)

## 2016-08-29 MED ORDER — MORPHINE SULFATE (PF) 4 MG/ML IV SOLN
INTRAVENOUS | Status: AC
  Filled 2016-08-29: qty 1

## 2016-08-29 MED ORDER — MORPHINE SULFATE (PF) 4 MG/ML IV SOLN
4.0000 mg | Freq: Once | INTRAVENOUS | Status: AC
  Administered 2016-08-29: 4 mg via INTRAVENOUS

## 2016-08-29 NOTE — ED Notes (Signed)
Patient transported to CT 

## 2016-08-29 NOTE — ED Provider Notes (Addendum)
[facility] Emergency Department Provider Note  ____________________________________________   I have reviewed the triage vital signs and the nursing notes.   HISTORY  Chief Complaint Hematuria    HPI Daniel Dickson is a [age] y.o. male who presents today complaining of pain in his penis and hematuria. Patient does have an ongoing: History of urinary retention. Patient states he does not recall any fall or injury. He has not had any sickness recently. He states he just began to have pain in his penis. He has not vomited. He did receive pain medications from EMS. His Foley is allegedly draining. Pain is sharp suprapubic,     Past Medical History:  Diagnosis Date  . Anxiety   . COPD (chronic obstructive pulmonary disease) (HCC)   . Coronary artery disease    a. 2012 s/p MI/cardiac arrest--> PCI mRCA.  Marland Kitchen. Hypertensive heart disease   . Mixed hyperlipidemia   . PTSD (post-traumatic stress disorder)    with headaches    Patient Active Problem List   Diagnosis Date Noted  . Acute respiratory failure (HCC) 02/18/2016  . Urinary retention 11/10/2015  . Hyperkalemia 11/02/2015  . Hypotension 11/02/2015  . Dysphagia, pharyngoesophageal phase 11/02/2015  . Esophageal spasm 11/02/2015  . Constipation 11/02/2015  . SIADH (syndrome of inappropriate ADH production) (HCC) 11/02/2015  . Urinary retention due to benign prostatic hyperplasia 11/02/2015  . Chronic indwelling Foley catheter 11/02/2015  . Bacteriuria with pyuria 11/02/2015  . Protein-calorie malnutrition, severe 10/30/2015  . ARF (acute renal failure) (HCC) 10/29/2015  . Dyspnea 10/07/2015  . Hyponatremia 06/06/2015  . Coronary artery disease   . Hypertensive heart disease   . Mixed hyperlipidemia   . COPD (chronic obstructive pulmonary disease) (HCC)   . COPD exacerbation (HCC) 01/11/2015  . Moderate COPD (chronic obstructive pulmonary disease) (HCC) 12/08/2014  . Fall 09/03/2014  . Hip  fracture (HCC) 09/03/2014  . PTSD (post-traumatic stress disorder) 09/03/2014  . Bilateral leg edema 09/03/2014  . SOB (shortness of breath) 10/31/2012  . Essential hypertension 10/31/2012  . CAD (coronary artery disease) 10/31/2012  . Hyperlipidemia 10/31/2012  . Tachycardia 10/31/2012    Past Surgical History:  Procedure Laterality Date  . CATARACT EXTRACTION    . CHOLECYSTECTOMY    . CORONARY ANGIOPLASTY     with stent; [facility]  . HAND SURGERY    . HEMORRHOID SURGERY    . HIP SURGERY    . VASCULAR SURGERY     stent R femoral artery    Prior to Admission medications   Medication Sig Start Date End Date Taking? Authorizing Provider  acetaminophen (TYLENOL) 500 MG tablet Take 500 mg by mouth daily as needed for mild pain or moderate pain.     [provider]  albuterol (PROAIR HFA) 108 (90 BASE) MCG/ACT inhaler Inhale 2 puffs into the lungs every 6 (six) hours as needed. For wheezing.    [provider]  albuterol (PROVENTIL) (2.5 MG/3ML) 0.083% nebulizer solution Take 3 mLs (2.5 mg total) by nebulization every 8 (eight) hours as needed for wheezing or shortness of breath. 10/09/15   Adrian SaranMody, Sital, MD  antiseptic oral rinse (BIOTENE) LIQD 15 mLs by Mouth Rinse route as needed for dry mouth.    [provider]  aspirin EC 81 MG tablet Take 81 mg by mouth daily.    [provider]  benzonatate (TESSALON PERLES) 100 MG capsule Take 1 capsule (100 mg total) by mouth every 6 (six) hours as needed for cough. 04/06/16  04/06/17  Phineas Semen, MD  Cholecalciferol (VITAMIN D3) 1000 UNITS CAPS Take 1,000 Units by mouth daily.    [provider]  famotidine (PEPCID) 40 MG tablet Take 1 tablet (40 mg total) by mouth every evening. Patient not taking: Reported on 07/27/2016 04/06/16 04/06/17  Arnaldo Natal, MD  feeding supplement (BOOST / RESOURCE BREEZE) LIQD Take 1 Container by mouth 3 (three) times daily between meals. 11/02/15   Katharina Caper, MD   finasteride (PROSCAR) 5 MG tablet Take 1 tablet (5 mg total) by mouth daily. Patient not taking: Reported on 07/27/2016 10/20/15 10/19/16  Nita Sickle, MD  fluticasone furoate-vilanterol (BREO ELLIPTA) 100-25 MCG/INH AEPB Inhale 1 puff into the lungs daily. Reported on 10/19/2015    [provider]  isosorbide mononitrate (IMDUR) 30 MG 24 hr tablet Take 1 tablet (30 mg total) by mouth daily. 11/02/15   Katharina Caper, MD  loratadine (CLARITIN) 10 MG tablet Take 10 mg by mouth daily.    [provider]  magnesium hydroxide (MILK OF MAGNESIA) 400 MG/5ML suspension Take 30 mLs by mouth 2 (two) times daily as needed for mild constipation or moderate constipation. 11/02/15   Katharina Caper, MD  Melatonin 5 MG CAPS Take 10 mg by mouth at bedtime.    [provider]  metoprolol succinate (TOPROL XL) 25 MG 24 hr tablet Take 1 tablet (25 mg total) by mouth daily. Patient not taking: Reported on 07/27/2016 02/22/16   Milagros Loll, MD  mupirocin cream (BACTROBAN) 2 % Apply topically daily. Patient not taking: Reported on 07/27/2016 11/02/15   Katharina Caper, MD  nitroGLYCERIN (NITROSTAT) 0.4 MG SL tablet Place 0.4 mg under the tongue every 5 (five) minutes as needed for chest pain. Reported on 10/19/2015    [provider]  ondansetron (ZOFRAN) 4 MG tablet Take 1 tablet (4 mg total) by mouth every 6 (six) hours as needed for nausea. Patient not taking: Reported on 07/27/2016 11/02/15   Katharina Caper, MD  oxybutynin (DITROPAN-XL) 5 MG 24 hr tablet Take as needed for bladder spasms up to three times a day 07/27/16   Michiel Cowboy A, PA-C  pantoprazole (PROTONIX) 40 MG tablet Take 1 tablet (40 mg total) by mouth daily. 11/02/15   Katharina Caper, MD  phenazopyridine (PYRIDIUM) 100 MG tablet Take 100 mg by mouth 3 (three) times daily with meals.    [provider]  tamsulosin (FLOMAX) 0.4 MG CAPS capsule Take by mouth.    [provider]  tiotropium (SPIRIVA)  18 MCG inhalation capsule Place 1 capsule (18 mcg total) into inhaler and inhale every evening. 10/09/15 10/08/16  Adrian Saran, MD    Allergies Cortisone; Dye fdc red [red dye]; Iodine (kelp)  [iodine]; Peanut oil; Peanut-containing drug products; and Penicillins  Family History  Problem Relation Age of Onset  . COPD Father   . Bladder Cancer Neg Hx   . Kidney cancer Neg Hx   . Prostate cancer Neg Hx     Social History Social History  Substance Use Topics  . Smoking status: Former Smoker    Packs/day: 3.00    Years: 40.00    Types: Cigarettes  . Smokeless tobacco: Never Used     Comment: quit 62 years  . Alcohol use Yes     Comment: wine at night.     Review of Systems Constitutional: No fever/chills Eyes: No visual changes. ENT: No sore throat. No stiff neck no neck pain Cardiovascular: Denies chest pain. Respiratory: Denies shortness of breath.  Gastrointestinal:   no vomiting.  No diarrhea.  No constipation. Genitourinary: Negative for dysuria. Musculoskeletal: Negative lower extremity swelling Skin: Negative for rash. Neurological: Negative for severe headaches, focal weakness or numbness. 10-point ROS otherwise negative.  ____________________________________________   PHYSICAL EXAM:  VITAL SIGNS: ED Triage Vitals  Enc Vitals Group     BP 08/29/16 2121 (!) 201/91     Pulse Rate 08/29/16 2118 (!) 109     Resp 08/29/16 2118 20     Temp 08/29/16 2118 98 F (36.7 C)     Temp Source 08/29/16 2118 Oral     SpO2 08/29/16 2118 96 %     Weight 08/29/16 2119 126 lb (57.2 kg)     Height 08/29/16 2119 5\' 6"  (1.676 m)     Head Circumference --      Peak Flow --      Pain Score 08/29/16 2118 7     Pain Loc --      Pain Edu? --      Excl. in GC? --     Constitutional: He is uncomfortable. Eyes: Conjunctivae are normal. PERRL. EOMI. Head: Atraumatic. Nose: No congestion/rhinnorhea. Mouth/Throat: Mucous membranes are moist.  Oropharynx non-erythematous. Neck: No  stridor.   Nontender with no meningismus Cardiovascular: Normal rate, regular rhythm. Grossly normal heart sounds.  Good peripheral circulation. Respiratory: Normal respiratory effort.  No retractions. Lungs CTAB. Abdominal: Soft and tubular tenderness and distention noted. No guarding or rebound GU: Normal-appearing penis scant blood noted around the meatus. Back:  There is no focal tenderness or step off.  there is no midline tenderness there are no lesions noted. there is no CVA tenderness Musculoskeletal: No lower extremity tenderness, no upper extremity tenderness. No joint effusions, no DVT signs strong distal pulses no edema Neurologic:  Normal speech and language. No gross focal neurologic deficits are appreciated.  Skin:  Skin is warm, dry and intact. No rash noted. Psychiatric: Mood and affect are normal. Speech and behavior are normal.  ____________________________________________   LABS (all labs ordered are listed, but only abnormal results are displayed)  Labs Reviewed  URINALYSIS, COMPLETE (UACMP) WITH MICROSCOPIC - Abnormal; Notable for the following:       Result Value   Color, Urine RED (*)    APPearance HAZY (*)    Glucose, UA   (*)    Value: TEST NOT REPORTED DUE TO COLOR INTERFERENCE OF URINE PIGMENT   Hgb urine dipstick   (*)    Value: TEST NOT REPORTED DUE TO COLOR INTERFERENCE OF URINE PIGMENT   Bilirubin Urine   (*)    Value: TEST NOT REPORTED DUE TO COLOR INTERFERENCE OF URINE PIGMENT   Ketones, ur   (*)    Value: TEST NOT REPORTED DUE TO COLOR INTERFERENCE OF URINE PIGMENT   Protein, ur   (*)    Value: TEST NOT REPORTED DUE TO COLOR INTERFERENCE OF URINE PIGMENT   Nitrite   (*)    Value: TEST NOT REPORTED DUE TO COLOR INTERFERENCE OF URINE PIGMENT   Leukocytes, UA   (*)    Value: TEST NOT REPORTED DUE TO COLOR INTERFERENCE OF URINE PIGMENT   All other components within normal limits  CBC - Abnormal; Notable for the following:    RBC 4.38 (*)    RDW  15.6 (*)    All other components within normal limits  BASIC METABOLIC PANEL - Abnormal; Notable for the following:    Sodium 132 (*)    Chloride 99 (*)  BUN 29 (*)    GFR calc non Af Amer 54 (*)    All other components within normal limits  URINE CULTURE  LACTIC ACID, PLASMA  LACTIC ACID, PLASMA   ____________________________________________  EKG  I personally interpreted any EKGs ordered by me or triage  ____________________________________________  RADIOLOGY  I reviewed any imaging ordered by me or triage that were performed during my shift and, if possible, patient and/or family made aware of any abnormal findings. ____________________________________________   PROCEDURES  Procedure(s) performed: None  Procedures  Critical Care performed: None  ____________________________________________   INITIAL IMPRESSION / ASSESSMENT AND PLAN / ED COURSE  Pertinent labs & imaging results that were available during my care of the patient were reviewed by me and considered in my medical decision making (see chart for details).  Truck suspicion for urinary retention likely clot, I however did ask for a bedside bladder scan and we came up with 150 cc of urine apparently. For this reason I ordered a CT scan which does show a large urinary retention. Given the patient pain medication, we will change his Foley and irrigated as needed. I think this will provide the patient with relief.  ----------------------------------------- 12:09 AM on 08/30/2016 -----------------------------------------  Pt had complete relief from his pain after we irrigated the foley. We were able to drain it. I then talked to dr. Marlou Porch who advised a coude, which  We placed. It is draining but there is still clot. We are irrigating it.  Pt will be observed in the ED; signed out to dr. Zenda Alpers at the end of my shift.     ____________________________________________   FINAL CLINICAL IMPRESSION(S) / ED  DIAGNOSES  Final diagnoses:  None      This chart was dictated using voice recognition software.  Despite best efforts to proofread,  errors can occur which can change meaning.      Jeanmarie Plant, MD 08/29/16 2232    Jeanmarie Plant, MD 08/30/16 Burna Mortimer

## 2016-08-29 NOTE — ED Notes (Signed)
Cathter irrigated per MD, several large clots in return, urine is draining, pink in color, pt tolerated well

## 2016-08-29 NOTE — ED Triage Notes (Signed)
Pt has a long term foley catheter, pt noticed blood in urine tonight with penile pain, pt denies any other problems

## 2016-08-30 DIAGNOSIS — R338 Other retention of urine: Secondary | ICD-10-CM | POA: Diagnosis not present

## 2016-08-30 DIAGNOSIS — Z880 Allergy status to penicillin: Secondary | ICD-10-CM | POA: Diagnosis not present

## 2016-08-30 DIAGNOSIS — J449 Chronic obstructive pulmonary disease, unspecified: Secondary | ICD-10-CM | POA: Diagnosis present

## 2016-08-30 DIAGNOSIS — Z955 Presence of coronary angioplasty implant and graft: Secondary | ICD-10-CM | POA: Diagnosis not present

## 2016-08-30 DIAGNOSIS — Z91018 Allergy to other foods: Secondary | ICD-10-CM | POA: Diagnosis not present

## 2016-08-30 DIAGNOSIS — N401 Enlarged prostate with lower urinary tract symptoms: Secondary | ICD-10-CM | POA: Diagnosis present

## 2016-08-30 DIAGNOSIS — Z7951 Long term (current) use of inhaled steroids: Secondary | ICD-10-CM | POA: Diagnosis not present

## 2016-08-30 DIAGNOSIS — R310 Gross hematuria: Secondary | ICD-10-CM | POA: Diagnosis present

## 2016-08-30 DIAGNOSIS — Z7982 Long term (current) use of aspirin: Secondary | ICD-10-CM | POA: Diagnosis not present

## 2016-08-30 DIAGNOSIS — Z79899 Other long term (current) drug therapy: Secondary | ICD-10-CM | POA: Diagnosis not present

## 2016-08-30 DIAGNOSIS — Z9102 Food additives allergy status: Secondary | ICD-10-CM | POA: Diagnosis not present

## 2016-08-30 DIAGNOSIS — I252 Old myocardial infarction: Secondary | ICD-10-CM | POA: Diagnosis not present

## 2016-08-30 DIAGNOSIS — E782 Mixed hyperlipidemia: Secondary | ICD-10-CM | POA: Diagnosis present

## 2016-08-30 DIAGNOSIS — R339 Retention of urine, unspecified: Secondary | ICD-10-CM | POA: Diagnosis not present

## 2016-08-30 DIAGNOSIS — Z91010 Allergy to peanuts: Secondary | ICD-10-CM | POA: Diagnosis not present

## 2016-08-30 DIAGNOSIS — I2510 Atherosclerotic heart disease of native coronary artery without angina pectoris: Secondary | ICD-10-CM | POA: Diagnosis present

## 2016-08-30 DIAGNOSIS — Z8674 Personal history of sudden cardiac arrest: Secondary | ICD-10-CM | POA: Diagnosis not present

## 2016-08-30 DIAGNOSIS — B952 Enterococcus as the cause of diseases classified elsewhere: Secondary | ICD-10-CM | POA: Diagnosis present

## 2016-08-30 DIAGNOSIS — Z87891 Personal history of nicotine dependence: Secondary | ICD-10-CM | POA: Diagnosis not present

## 2016-08-30 DIAGNOSIS — Z888 Allergy status to other drugs, medicaments and biological substances status: Secondary | ICD-10-CM | POA: Diagnosis not present

## 2016-08-30 DIAGNOSIS — I119 Hypertensive heart disease without heart failure: Secondary | ICD-10-CM | POA: Diagnosis present

## 2016-08-30 DIAGNOSIS — R8271 Bacteriuria: Secondary | ICD-10-CM | POA: Diagnosis present

## 2016-08-30 DIAGNOSIS — K219 Gastro-esophageal reflux disease without esophagitis: Secondary | ICD-10-CM | POA: Diagnosis present

## 2016-08-30 DIAGNOSIS — B965 Pseudomonas (aeruginosa) (mallei) (pseudomallei) as the cause of diseases classified elsewhere: Secondary | ICD-10-CM | POA: Diagnosis present

## 2016-08-30 DIAGNOSIS — N3289 Other specified disorders of bladder: Secondary | ICD-10-CM | POA: Diagnosis present

## 2016-08-30 LAB — TSH: TSH: 3.204 u[IU]/mL (ref 0.350–4.500)

## 2016-08-30 LAB — LACTIC ACID, PLASMA: Lactic Acid, Venous: 0.9 mmol/L (ref 0.5–1.9)

## 2016-08-30 MED ORDER — ACETAMINOPHEN 650 MG RE SUPP: 650.0000 mg | Freq: Four times a day (QID) | RECTAL | Status: DC | PRN

## 2016-08-30 MED ORDER — ALBUTEROL SULFATE (2.5 MG/3ML) 0.083% IN NEBU
2.5000 mg | INHALATION_SOLUTION | RESPIRATORY_TRACT | Status: DC | PRN
  Administered 2016-08-30 – 2016-09-02 (×7): 2.5 mg via RESPIRATORY_TRACT
  Filled 2016-08-30 (×8): qty 3

## 2016-08-30 MED ORDER — NITROGLYCERIN 0.4 MG SL SUBL: 0.4000 mg | SUBLINGUAL_TABLET | SUBLINGUAL | Status: DC | PRN

## 2016-08-30 MED ORDER — ADULT MULTIVITAMIN W/MINERALS CH
1.0000 | ORAL_TABLET | Freq: Every day | ORAL | Status: DC
  Administered 2016-08-30 – 2016-09-02 (×4): 1 via ORAL
  Filled 2016-08-30 (×5): qty 1

## 2016-08-30 MED ORDER — FINASTERIDE 5 MG PO TABS
5.0000 mg | ORAL_TABLET | Freq: Every day | ORAL | Status: DC
  Administered 2016-08-30 – 2016-09-02 (×4): 5 mg via ORAL
  Filled 2016-08-30 (×5): qty 1

## 2016-08-30 MED ORDER — BIOTENE DRY MOUTH MT LIQD: 15.0000 mL | OROMUCOSAL | Status: DC | PRN

## 2016-08-30 MED ORDER — VITAMIN D 1000 UNITS PO TABS
1000.0000 [IU] | ORAL_TABLET | Freq: Every day | ORAL | Status: DC
  Administered 2016-08-30 – 2016-09-02 (×4): 1000 [IU] via ORAL
  Filled 2016-08-30 (×5): qty 1

## 2016-08-30 MED ORDER — METOPROLOL SUCCINATE ER 25 MG PO TB24
25.0000 mg | ORAL_TABLET | Freq: Every day | ORAL | Status: DC
  Administered 2016-08-30 – 2016-09-01 (×3): 25 mg via ORAL
  Filled 2016-08-30 (×3): qty 1

## 2016-08-30 MED ORDER — OXYBUTYNIN CHLORIDE ER 5 MG PO TB24
5.0000 mg | ORAL_TABLET | Freq: Every day | ORAL | Status: DC
  Administered 2016-08-30 – 2016-09-01 (×3): 5 mg via ORAL
  Filled 2016-08-30 (×3): qty 1

## 2016-08-30 MED ORDER — ASPIRIN EC 81 MG PO TBEC
81.0000 mg | DELAYED_RELEASE_TABLET | Freq: Every day | ORAL | Status: DC
  Administered 2016-08-30 – 2016-09-02 (×4): 81 mg via ORAL
  Filled 2016-08-30 (×5): qty 1

## 2016-08-30 MED ORDER — MELATONIN 5 MG PO TABS
10.0000 mg | ORAL_TABLET | Freq: Every day | ORAL | Status: DC
  Administered 2016-08-30 – 2016-09-01 (×3): 10 mg via ORAL
  Filled 2016-08-30 (×4): qty 2

## 2016-08-30 MED ORDER — ENSURE ENLIVE PO LIQD
237.0000 mL | Freq: Two times a day (BID) | ORAL | Status: DC
  Administered 2016-08-31 – 2016-09-02 (×5): 237 mL via ORAL

## 2016-08-30 MED ORDER — BENZONATATE 100 MG PO CAPS: 100.0000 mg | ORAL_CAPSULE | Freq: Four times a day (QID) | ORAL | Status: DC | PRN

## 2016-08-30 MED ORDER — PRAVASTATIN SODIUM 40 MG PO TABS
40.0000 mg | ORAL_TABLET | Freq: Every day | ORAL | Status: DC
  Administered 2016-08-30 – 2016-09-01 (×3): 40 mg via ORAL
  Filled 2016-08-30 (×4): qty 1

## 2016-08-30 MED ORDER — TAMSULOSIN HCL 0.4 MG PO CAPS
0.4000 mg | ORAL_CAPSULE | Freq: Every day | ORAL | Status: DC
  Administered 2016-08-30 – 2016-09-02 (×4): 0.4 mg via ORAL
  Filled 2016-08-30 (×5): qty 1

## 2016-08-30 MED ORDER — MORPHINE SULFATE (PF) 4 MG/ML IV SOLN
INTRAVENOUS | Status: AC
  Filled 2016-08-30: qty 1

## 2016-08-30 MED ORDER — ACETAMINOPHEN 325 MG PO TABS: 650.0000 mg | ORAL_TABLET | Freq: Four times a day (QID) | ORAL | Status: DC | PRN

## 2016-08-30 MED ORDER — ISOSORBIDE MONONITRATE ER 30 MG PO TB24
30.0000 mg | ORAL_TABLET | Freq: Every day | ORAL | Status: DC
  Administered 2016-08-30 – 2016-09-01 (×2): 30 mg via ORAL
  Filled 2016-08-30 (×3): qty 1

## 2016-08-30 MED ORDER — FAMOTIDINE 20 MG PO TABS: 40.0000 mg | ORAL_TABLET | Freq: Every evening | ORAL | Status: DC

## 2016-08-30 MED ORDER — PHENAZOPYRIDINE HCL 100 MG PO TABS
100.0000 mg | ORAL_TABLET | Freq: Three times a day (TID) | ORAL | Status: DC
  Administered 2016-08-30 – 2016-09-02 (×8): 100 mg via ORAL
  Filled 2016-08-30 (×9): qty 1

## 2016-08-30 MED ORDER — DOCUSATE SODIUM 100 MG PO CAPS
100.0000 mg | ORAL_CAPSULE | Freq: Two times a day (BID) | ORAL | Status: DC
  Administered 2016-08-30 – 2016-09-01 (×6): 100 mg via ORAL
  Filled 2016-08-30 (×6): qty 1

## 2016-08-30 MED ORDER — BOOST / RESOURCE BREEZE PO LIQD: 1.0000 | Freq: Three times a day (TID) | ORAL | Status: DC

## 2016-08-30 MED ORDER — TIOTROPIUM BROMIDE MONOHYDRATE 18 MCG IN CAPS
18.0000 ug | ORAL_CAPSULE | Freq: Every evening | RESPIRATORY_TRACT | Status: DC
  Administered 2016-08-30 – 2016-09-01 (×3): 18 ug via RESPIRATORY_TRACT
  Filled 2016-08-30: qty 5

## 2016-08-30 MED ORDER — ONDANSETRON HCL 4 MG/2ML IJ SOLN
4.0000 mg | Freq: Four times a day (QID) | INTRAMUSCULAR | Status: DC | PRN
  Administered 2016-08-30: 4 mg via INTRAVENOUS
  Filled 2016-08-30: qty 2

## 2016-08-30 MED ORDER — IPRATROPIUM-ALBUTEROL 0.5-2.5 (3) MG/3ML IN SOLN
3.0000 mL | Freq: Once | RESPIRATORY_TRACT | Status: AC
  Administered 2016-08-30: 3 mL via RESPIRATORY_TRACT
  Filled 2016-08-30: qty 3

## 2016-08-30 MED ORDER — FLUTICASONE FUROATE-VILANTEROL 100-25 MCG/INH IN AEPB
1.0000 | INHALATION_SPRAY | Freq: Every day | RESPIRATORY_TRACT | Status: DC
  Administered 2016-08-30 – 2016-09-02 (×4): 1 via RESPIRATORY_TRACT
  Filled 2016-08-30: qty 28

## 2016-08-30 MED ORDER — LORATADINE 10 MG PO TABS
10.0000 mg | ORAL_TABLET | Freq: Every day | ORAL | Status: DC
Start: 2016-08-30 — End: 2016-09-02
  Administered 2016-08-30 – 2016-09-02 (×4): 10 mg via ORAL
  Filled 2016-08-30 (×5): qty 1

## 2016-08-30 MED ORDER — MORPHINE SULFATE (PF) 4 MG/ML IV SOLN
4.0000 mg | INTRAVENOUS | Status: DC | PRN
  Administered 2016-08-30: 4 mg via INTRAVENOUS

## 2016-08-30 MED ORDER — ONDANSETRON HCL 4 MG PO TABS: 4.0000 mg | ORAL_TABLET | Freq: Four times a day (QID) | ORAL | Status: DC | PRN

## 2016-08-30 MED ORDER — PANTOPRAZOLE SODIUM 40 MG PO TBEC
40.0000 mg | DELAYED_RELEASE_TABLET | Freq: Every day | ORAL | Status: DC
  Administered 2016-08-30 – 2016-09-02 (×4): 40 mg via ORAL
  Filled 2016-08-30 (×5): qty 1

## 2016-08-30 NOTE — Consult Note (Signed)
Reason for Consult: Chronic Urinary Rention / Large Prostate, Gross Hematuria  Referring Physician: Selena Lesser MD  Daniel Dickson is an 83 y.o. male.   HPI:   1 - Chronic Urinary Retention / Large Prostate - catheter dependant since 2017 for large prostate in very elderly man with significant comorbidity. Gets changes monthly by home health. He is on finasteride + tamsulosin and failed trial of void x many and not interested in surgery / not surgical candidate. Prostate vol 87m by CT 2018. Cr <1.2 2018.  2 - Gross Hematuria - new gross hematuria per foley 08/2016. . CT x several 2018 w/o stones or mass.  On ASA only.  PMH sig for CAD/Stent, COPD / O2  Past Medical History:  Diagnosis Date  . Anxiety   . [place]   . Coronary artery disease    a. 2012 s/p MI/cardiac arrest--> PCI mRCA.  .Marland KitchenHypertensive heart disease   . Mixed hyperlipidemia   . PTSD (post-traumatic stress disorder)    with headaches    Past Surgical History:  Procedure Laterality Date  . CATARACT EXTRACTION    . CHOLECYSTECTOMY    . CORONARY ANGIOPLASTY     with stent; [facility]  . HAND SURGERY    . HEMORRHOID SURGERY    . HIP SURGERY    . VASCULAR SURGERY     stent R femoral artery    Family History  Problem Relation Age of Onset  . COPD Father   . Bladder Cancer Neg Hx   . Kidney cancer Neg Hx   . Prostate cancer Neg Hx     Social History:  reports that he has quit smoking. His smoking use included Cigarettes. He has a 120.00 pack-year smoking history. He has never used smokeless tobacco. He reports that he drinks alcohol. He reports that he does not use drugs.  Allergies:  Allergies  Allergen Reactions  . Cortisone Other (See Comments)    Other reaction(s): Other (See Comments) GI Upset Unknown reaction  . Dye Fdc Red [Red Dye] Other (See Comments)    Unknown reaction.  . Iodine (Kelp)  [Iodine]     Other reaction(s): Other (See Comments) GI  Upset  . Peanut Oil     Other reaction(s): Unknown  . Peanut-Containing Drug Products     Other reaction(s): Unknown  . Penicillins Rash and Other (See Comments)    GI Upset patient not sure of reaction Has patient had a PCN reaction causing immediate rash, facial/tongue/throat swelling, SOB or lightheadedness with hypotension: Unknown Has patient had a PCN reaction causing severe rash involving mucus membranes or skin necrosis: Unknown Has patient had a PCN reaction that required hospitalization: Unknown Has patient had a PCN reaction occurring within the last 10 years: Unknown If all of the above answers are "NO", then may proceed with Cephalosporin use.     Medications: I have reviewed the patient's current medications.  Results for orders placed or performed during the hospital encounter of 08/29/16 (from the past 48 hour(s))  Urinalysis, Complete w Microscopic     Status: Abnormal   Collection Time: 08/29/16  9:19 PM  Result Value Ref Range   Color, Urine RED (A) YELLOW   APPearance HAZY (A) CLEAR   Specific Gravity, Urine 1.011 1.005 - 1.030   pH  5.0 - 8.0    TEST NOT REPORTED DUE TO COLOR INTERFERENCE OF URINE PIGMENT   Glucose, UA (A) NEGATIVE mg/dL    TEST  NOT REPORTED DUE TO COLOR INTERFERENCE OF URINE PIGMENT   Hgb urine dipstick (A) NEGATIVE    TEST NOT REPORTED DUE TO COLOR INTERFERENCE OF URINE PIGMENT   Bilirubin Urine (A) NEGATIVE    TEST NOT REPORTED DUE TO COLOR INTERFERENCE OF URINE PIGMENT   Ketones, ur (A) NEGATIVE mg/dL    TEST NOT REPORTED DUE TO COLOR INTERFERENCE OF URINE PIGMENT   Protein, ur (A) NEGATIVE mg/dL    TEST NOT REPORTED DUE TO COLOR INTERFERENCE OF URINE PIGMENT   Nitrite (A) NEGATIVE    TEST NOT REPORTED DUE TO COLOR INTERFERENCE OF URINE PIGMENT   Leukocytes, UA (A) NEGATIVE    TEST NOT REPORTED DUE TO COLOR INTERFERENCE OF URINE PIGMENT   RBC / HPF TOO NUMEROUS TO COUNT 0 - 5 RBC/hpf   WBC, UA TOO NUMEROUS TO COUNT 0 - 5 WBC/hpf    Bacteria, UA NONE SEEN NONE SEEN   Squamous Epithelial / LPF NONE SEEN NONE SEEN  CBC     Status: Abnormal   Collection Time: 08/29/16  9:19 PM  Result Value Ref Range   WBC 7.9 3.8 - 10.6 K/uL   RBC 4.38 (L) 4.40 - 5.90 MIL/uL   Hemoglobin 14.3 13.0 - 18.0 g/dL   HCT 42.2 40.0 - 52.0 %   MCV 96.4 80.0 - 100.0 fL   MCH 32.8 26.0 - 34.0 pg   MCHC 34.0 32.0 - 36.0 g/dL   RDW 15.6 (H) 11.5 - 14.5 %   Platelets 197 150 - 440 K/uL  Basic metabolic panel     Status: Abnormal   Collection Time: 08/29/16  9:19 PM  Result Value Ref Range   Sodium 132 (L) 135 - 145 mmol/L   Potassium 4.5 3.5 - 5.1 mmol/L   Chloride 99 (L) 101 - 111 mmol/L   CO2 26 22 - 32 mmol/L   Glucose, Bld 91 65 - 99 mg/dL   BUN 29 (H) 6 - 20 mg/dL   Creatinine, Ser 1.07 0.61 - 1.24 mg/dL   Calcium 9.7 8.9 - 10.3 mg/dL   GFR calc non Af Amer 54 (L) >60 mL/min   GFR calc Af Amer >60 >60 mL/min    Comment: (NOTE) The eGFR has been calculated using the CKD EPI equation. This calculation has not been validated in all clinical situations. eGFR's persistently <60 mL/min signify possible Chronic Kidney Disease.    Anion gap 7 5 - 15  Lactic acid, plasma     Status: None   Collection Time: 08/29/16  9:52 PM  Result Value Ref Range   Lactic Acid, Venous 1.6 0.5 - 1.9 mmol/L  Lactic acid, plasma     Status: None   Collection Time: 08/30/16  2:05 AM  Result Value Ref Range   Lactic Acid, Venous 0.9 0.5 - 1.9 mmol/L  TSH     Status: None   Collection Time: 08/30/16  5:33 AM  Result Value Ref Range   TSH 3.204 0.350 - 4.500 uIU/mL    Comment: Performed by a 3rd Generation assay with a functional sensitivity of <=0.01 uIU/mL.    Ct Renal Stone Study  Result Date: 08/29/2016 CLINICAL DATA:  Acute onset of gross hematuria and penile pain. Patient has indwelling Foley catheter. Initial encounter. EXAM: CT ABDOMEN AND PELVIS WITHOUT CONTRAST TECHNIQUE: Multidetector CT imaging of the abdomen and pelvis was performed  following the standard protocol without IV contrast. COMPARISON:  CT of the pelvis performed 10/24/2015 FINDINGS: Lower chest: Minimal bibasilar scarring  is noted. Emphysema is noted at the lung bases. Diffuse coronary artery calcifications are seen. Hepatobiliary: The liver is unremarkable in appearance. The patient is status post cholecystectomy, with clips noted at the gallbladder fossa. The common bile duct remains normal in caliber. Pancreas: The pancreas is within normal limits. Spleen: The spleen is unremarkable in appearance. Adrenals/Urinary Tract: The adrenal glands are unremarkable in appearance. An 8 mm focus of increased attenuation is noted at the interpole region of the right kidney, of uncertain significance. Bilateral renal pelvicaliectasis remains within normal limits. No distal obstructing ureteral stones are seen. No nonobstructing renal stones are identified. Mild perinephric stranding is noted bilaterally. Stomach/Bowel: The stomach is unremarkable in appearance. The small bowel is within normal limits. The appendix is not visualized; there is no evidence for appendicitis. Mild diverticulosis is noted along the descending and sigmoid colon, without evidence of diverticulitis. Vascular/Lymphatic: Diffuse calcification is seen along the abdominal aorta and its branches. The inferior vena cava is grossly unremarkable in appearance. No retroperitoneal or pelvic sidewall lymphadenopathy is seen. Reproductive: The bladder is moderately distended, with a Foley catheter in place. A large amount of clot is noted at the inferior aspect of the bladder. The prostate is enlarged, measuring 5.6 cm in transverse dimension. Other: A prominent lipoma is noted along the right lower quadrant anterior abdominal wall. Musculoskeletal: No acute osseous abnormalities are identified. The patient's right proximal femoral hardware is grossly unremarkable. Chronic compression deformity is noted at vertebral body L2. The  visualized musculature is unremarkable in appearance. IMPRESSION: 1. Large amount of clot noted at the inferior aspect of the bladder. This is of uncertain etiology. It may reflect some degree of trauma to the prostate. Cystoscopy could be considered for further evaluation, as deemed clinically appropriate. Foley catheter is grossly unremarkable in appearance. 2. Enlarged prostate noted. 3. 8 mm nonspecific focus of increased attenuation at the interpole region of the right kidney, without a dominant mass. 4. Mild diverticulosis along the descending and sigmoid colon, without evidence of diverticulitis. 5. Diffuse aortic atherosclerosis. 6. Prominent lipoma along the right lower quadrant anterior abdominal wall. 7. Emphysema at the lung bases. 8. Diffuse coronary artery calcifications seen. 9. Chronic compression deformity of vertebral body L2. Electronically Signed   By: Garald Balding M.D.   On: 08/29/2016 22:27    Review of Systems  Constitutional: Negative.  Negative for chills and fever.  HENT: Negative.   Respiratory: Positive for shortness of breath and wheezing.   Cardiovascular: Negative.   Gastrointestinal: Negative.   Genitourinary: Positive for hematuria. Negative for dysuria and flank pain.  Musculoskeletal: Negative.   Skin: Negative.   Neurological: Negative.   Endo/Heme/Allergies: Negative.   Psychiatric/Behavioral: Negative.    Blood pressure (!) 160/80, pulse 92, temperature 97.5 F (36.4 C), temperature source Oral, resp. rate 20, height 5' 6"  (1.676 m), weight 56.6 kg (124 lb 12.8 oz), SpO2 93 %. Physical Exam  Constitutional: He appears well-developed and well-nourished.  Frail, elderly, but very mentally astute.  HENT:  Head: Normocephalic.  Eyes: Pupils are equal, round, and reactive to light.  Neck: Normal range of motion.  Cardiovascular: Normal rate.   Respiratory: Effort normal.  GI: Soft.  Genitourinary: Penis normal.  Genitourinary Comments: 31F 3 way  catheter in place on slow irrigation with efflux very light pink. Placed on 2 strap tension and complete resolution of hematuria.   Musculoskeletal: Normal range of motion.  Neurological: He is alert.  Skin: Skin is warm.  Psychiatric: He has  a normal mood and affect.    Assessment/Plan:  1 - Chronic Urinary Retention / Large Prostate - catheter canged today for hematuria typ large bore to help manage hematuria below, otherwise continue Q monhtly HH changes. He is not surgical candidate for outlet procedure.   2 - Gross Hematuria - this is most certainly from minor catheter trauma in setting of large prostate. Imaging otherwise favorable. Continue gentle bladder irrigation, foley traction, this will likely resolve over next 48 hours as is already improving with traction on cathter which provides pressure to prostatic fossa.   Should this become refracotyr, would consider more detailed contrast imaging or cystso.  Jennylee Uehara 08/30/2016, 9:37 AM

## 2016-08-30 NOTE — H&P (Signed)
Daniel Dickson is an 83 y.o. male.   Chief Complaint: Hematuria HPI: The patient with past medical history of urinary retention, CAD and COPD presents to the emergency department complaining of hematuria. He has an indwelling Foley catheter placed approximately 1 month ago that has been causing penile pain. The patient has had increasingly red urine over the last 24-48 hours. He also complains of bladder spasms which are very painful. He denies chest pain or shortness of breath at this time although he admits to wheezing and chest tightness particularly in the early morning hours. He denies cough. In the emergency department a coud catheter was placed and continuous bladder irrigation started. The patient's urine would not clear nor his abdominal pain subsided which prompted emergency department staff to call the hospitalist service for admission.  Past Medical History:  Diagnosis Date  . Anxiety   . COPD (chronic obstructive pulmonary disease) ([place])   . Coronary artery disease    a. 2012 s/p MI/cardiac arrest--> PCI mRCA.  Marland Kitchen Hypertensive heart disease   . Mixed hyperlipidemia   . PTSD (post-traumatic stress disorder)    with headaches    Past Surgical History:  Procedure Laterality Date  . CATARACT EXTRACTION    . CHOLECYSTECTOMY    . CORONARY ANGIOPLASTY     with stent; [facility]  . HAND SURGERY    . HEMORRHOID SURGERY    . HIP SURGERY    . VASCULAR SURGERY     stent R femoral artery    Family History  Problem Relation Age of Onset  . COPD Father   . Bladder Cancer Neg Hx   . Kidney cancer Neg Hx   . Prostate cancer Neg Hx    Social History:  reports that he has quit smoking. His smoking use included Cigarettes. He has a 120.00 pack-year smoking history. He has never used smokeless tobacco. He reports that he drinks alcohol. He reports that he does not use drugs.  Allergies:  Allergies  Allergen Reactions  . Cortisone Other (See Comments)    Other reaction(s): Other (See  Comments) GI Upset Unknown reaction  . Dye Fdc Red [Red Dye] Other (See Comments)    Unknown reaction.  . Iodine (Kelp)  [Iodine]     Other reaction(s): Other (See Comments) GI Upset  . Peanut Oil     Other reaction(s): Unknown  . Peanut-Containing Drug Products     Other reaction(s): Unknown  . Penicillins Rash and Other (See Comments)    GI Upset patient not sure of reaction Has patient had a PCN reaction causing immediate rash, facial/tongue/throat swelling, SOB or lightheadedness with hypotension: Unknown Has patient had a PCN reaction causing severe rash involving mucus membranes or skin necrosis: Unknown Has patient had a PCN reaction that required hospitalization: Unknown Has patient had a PCN reaction occurring within the last 10 years: Unknown If all of the above answers are "NO", then may proceed with Cephalosporin use.     Medications Prior to Admission  Medication Sig Dispense Refill  . albuterol (PROVENTIL) (2.5 MG/3ML) 0.083% nebulizer solution Take 3 mLs (2.5 mg total) by nebulization every 8 (eight) hours as needed for wheezing or shortness of breath. 75 mL 12  . benzonatate (TESSALON PERLES) 100 MG capsule Take 1 capsule (100 mg total) by mouth every 6 (six) hours as needed for cough. 30 capsule 0  . Cholecalciferol (VITAMIN D3) 1000 UNITS CAPS Take 1,000 Units by mouth daily.    . isosorbide mononitrate (IMDUR) 30  MG 24 hr tablet Take 1 tablet (30 mg total) by mouth daily. 30 tablet 5  . pantoprazole (PROTONIX) 40 MG tablet Take 1 tablet (40 mg total) by mouth daily. 30 tablet 5  . pravastatin (PRAVACHOL) 80 MG tablet Take 40 mg by mouth daily.    . tamsulosin (FLOMAX) 0.4 MG CAPS capsule Take 0.4 mg by mouth daily.     Marland Kitchen acetaminophen (TYLENOL) 500 MG tablet Take 500 mg by mouth daily as needed for mild pain or moderate pain.     Marland Kitchen albuterol (PROAIR HFA) 108 (90 BASE) MCG/ACT inhaler Inhale 2 puffs into the lungs every 6 (six) hours as needed. For wheezing.    Marland Kitchen  antiseptic oral rinse (BIOTENE) LIQD 15 mLs by Mouth Rinse route as needed for dry mouth.    Marland Kitchen aspirin EC 81 MG tablet Take 81 mg by mouth daily.    . famotidine (PEPCID) 40 MG tablet Take 1 tablet (40 mg total) by mouth every evening. (Patient not taking: Reported on 07/27/2016) 30 tablet 1  . feeding supplement (BOOST / RESOURCE BREEZE) LIQD Take 1 Container by mouth 3 (three) times daily between meals. 90 Container 5  . finasteride (PROSCAR) 5 MG tablet Take 1 tablet (5 mg total) by mouth daily. (Patient not taking: Reported on 07/27/2016) 15 tablet 0  . fluticasone furoate-vilanterol (BREO ELLIPTA) 100-25 MCG/INH AEPB Inhale 1 puff into the lungs daily. Reported on 10/19/2015    . loratadine (CLARITIN) 10 MG tablet Take 10 mg by mouth daily.    . magnesium hydroxide (MILK OF MAGNESIA) 400 MG/5ML suspension Take 30 mLs by mouth 2 (two) times daily as needed for mild constipation or moderate constipation. 360 mL 0  . Melatonin 5 MG CAPS Take 10 mg by mouth at bedtime.    . metoprolol succinate (TOPROL XL) 25 MG 24 hr tablet Take 1 tablet (25 mg total) by mouth daily. (Patient not taking: Reported on 07/27/2016)    . mupirocin cream (BACTROBAN) 2 % Apply topically daily. (Patient not taking: Reported on 07/27/2016) 15 g 0  . nitroGLYCERIN (NITROSTAT) 0.4 MG SL tablet Place 0.4 mg under the tongue every 5 (five) minutes as needed for chest pain. Reported on 10/19/2015    . ondansetron (ZOFRAN) 4 MG tablet Take 1 tablet (4 mg total) by mouth every 6 (six) hours as needed for nausea. (Patient not taking: Reported on 07/27/2016) 20 tablet 0  . oxybutynin (DITROPAN-XL) 5 MG 24 hr tablet Take as needed for bladder spasms up to three times a day 90 tablet 3  . phenazopyridine (PYRIDIUM) 100 MG tablet Take 100 mg by mouth 3 (three) times daily with meals.    . tiotropium (SPIRIVA) 18 MCG inhalation capsule Place 1 capsule (18 mcg total) into inhaler and inhale every evening. 30 capsule 12    Results for orders  placed or performed during the hospital encounter of 08/29/16 (from the past 48 hour(s))  Urinalysis, Complete w Microscopic     Status: Abnormal   Collection Time: 08/29/16  9:19 PM  Result Value Ref Range   Color, Urine RED (A) YELLOW   APPearance HAZY (A) CLEAR   Specific Gravity, Urine 1.011 1.005 - 1.030   pH  5.0 - 8.0    TEST NOT REPORTED DUE TO COLOR INTERFERENCE OF URINE PIGMENT   Glucose, UA (A) NEGATIVE mg/dL    TEST NOT REPORTED DUE TO COLOR INTERFERENCE OF URINE PIGMENT   Hgb urine dipstick (A) NEGATIVE    TEST  NOT REPORTED DUE TO COLOR INTERFERENCE OF URINE PIGMENT   Bilirubin Urine (A) NEGATIVE    TEST NOT REPORTED DUE TO COLOR INTERFERENCE OF URINE PIGMENT   Ketones, ur (A) NEGATIVE mg/dL    TEST NOT REPORTED DUE TO COLOR INTERFERENCE OF URINE PIGMENT   Protein, ur (A) NEGATIVE mg/dL    TEST NOT REPORTED DUE TO COLOR INTERFERENCE OF URINE PIGMENT   Nitrite (A) NEGATIVE    TEST NOT REPORTED DUE TO COLOR INTERFERENCE OF URINE PIGMENT   Leukocytes, UA (A) NEGATIVE    TEST NOT REPORTED DUE TO COLOR INTERFERENCE OF URINE PIGMENT   RBC / HPF TOO NUMEROUS TO COUNT 0 - 5 RBC/hpf   WBC, UA TOO NUMEROUS TO COUNT 0 - 5 WBC/hpf   Bacteria, UA NONE SEEN NONE SEEN   Squamous Epithelial / LPF NONE SEEN NONE SEEN  CBC     Status: Abnormal   Collection Time: 08/29/16  9:19 PM  Result Value Ref Range   WBC 7.9 3.8 - 10.6 K/uL   RBC 4.38 (L) 4.40 - 5.90 MIL/uL   Hemoglobin 14.3 13.0 - 18.0 g/dL   HCT 42.2 40.0 - 52.0 %   MCV 96.4 80.0 - 100.0 fL   MCH 32.8 26.0 - 34.0 pg   MCHC 34.0 32.0 - 36.0 g/dL   RDW 15.6 (H) 11.5 - 14.5 %   Platelets 197 150 - 440 K/uL  Basic metabolic panel     Status: Abnormal   Collection Time: 08/29/16  9:19 PM  Result Value Ref Range   Sodium 132 (L) 135 - 145 mmol/L   Potassium 4.5 3.5 - 5.1 mmol/L   Chloride 99 (L) 101 - 111 mmol/L   CO2 26 22 - 32 mmol/L   Glucose, Bld 91 65 - 99 mg/dL   BUN 29 (H) 6 - 20 mg/dL   Creatinine, Ser 1.07 0.61 -  1.24 mg/dL   Calcium 9.7 8.9 - 10.3 mg/dL   GFR calc non Af Amer 54 (L) >60 mL/min   GFR calc Af Amer >60 >60 mL/min    Comment: (NOTE) The eGFR has been calculated using the CKD EPI equation. This calculation has not been validated in all clinical situations. eGFR's persistently <60 mL/min signify possible Chronic Kidney Disease.    Anion gap 7 5 - 15  Lactic acid, plasma     Status: None   Collection Time: 08/29/16  9:52 PM  Result Value Ref Range   Lactic Acid, Venous 1.6 0.5 - 1.9 mmol/L  Lactic acid, plasma     Status: None   Collection Time: 08/30/16  2:05 AM  Result Value Ref Range   Lactic Acid, Venous 0.9 0.5 - 1.9 mmol/L  TSH     Status: None   Collection Time: 08/30/16  5:33 AM  Result Value Ref Range   TSH 3.204 0.350 - 4.500 uIU/mL    Comment: Performed by a 3rd Generation assay with a functional sensitivity of <=0.01 uIU/mL.   Ct Renal Stone Study  Result Date: 08/29/2016 CLINICAL DATA:  Acute onset of gross hematuria and penile pain. Patient has indwelling Foley catheter. Initial encounter. EXAM: CT ABDOMEN AND PELVIS WITHOUT CONTRAST TECHNIQUE: Multidetector CT imaging of the abdomen and pelvis was performed following the standard protocol without IV contrast. COMPARISON:  CT of the pelvis performed 10/24/2015 FINDINGS: Lower chest: Minimal bibasilar scarring is noted. Emphysema is noted at the lung bases. Diffuse coronary artery calcifications are seen. Hepatobiliary: The liver is unremarkable in appearance.  The patient is status post cholecystectomy, with clips noted at the gallbladder fossa. The common bile duct remains normal in caliber. Pancreas: The pancreas is within normal limits. Spleen: The spleen is unremarkable in appearance. Adrenals/Urinary Tract: The adrenal glands are unremarkable in appearance. An 8 mm focus of increased attenuation is noted at the interpole region of the right kidney, of uncertain significance. Bilateral renal pelvicaliectasis remains  within normal limits. No distal obstructing ureteral stones are seen. No nonobstructing renal stones are identified. Mild perinephric stranding is noted bilaterally. Stomach/Bowel: The stomach is unremarkable in appearance. The small bowel is within normal limits. The appendix is not visualized; there is no evidence for appendicitis. Mild diverticulosis is noted along the descending and sigmoid colon, without evidence of diverticulitis. Vascular/Lymphatic: Diffuse calcification is seen along the abdominal aorta and its branches. The inferior vena cava is grossly unremarkable in appearance. No retroperitoneal or pelvic sidewall lymphadenopathy is seen. Reproductive: The bladder is moderately distended, with a Foley catheter in place. A large amount of clot is noted at the inferior aspect of the bladder. The prostate is enlarged, measuring 5.6 cm in transverse dimension. Other: A prominent lipoma is noted along the right lower quadrant anterior abdominal wall. Musculoskeletal: No acute osseous abnormalities are identified. The patient's right proximal femoral hardware is grossly unremarkable. Chronic compression deformity is noted at vertebral body L2. The visualized musculature is unremarkable in appearance. IMPRESSION: 1. Large amount of clot noted at the inferior aspect of the bladder. This is of uncertain etiology. It may reflect some degree of trauma to the prostate. Cystoscopy could be considered for further evaluation, as deemed clinically appropriate. Foley catheter is grossly unremarkable in appearance. 2. Enlarged prostate noted. 3. 8 mm nonspecific focus of increased attenuation at the interpole region of the right kidney, without a dominant mass. 4. Mild diverticulosis along the descending and sigmoid colon, without evidence of diverticulitis. 5. Diffuse aortic atherosclerosis. 6. Prominent lipoma along the right lower quadrant anterior abdominal wall. 7. Emphysema at the lung bases. 8. Diffuse coronary  artery calcifications seen. 9. Chronic compression deformity of vertebral body L2. Electronically Signed   By: Garald Balding M.D.   On: 08/29/2016 22:27    Review of Systems  Constitutional: Negative for chills and fever.  HENT: Negative for sore throat and tinnitus.   Eyes: Negative for blurred vision and redness.  Respiratory: Negative for cough and shortness of breath.   Cardiovascular: Negative for chest pain, palpitations, orthopnea and PND.  Gastrointestinal: Positive for abdominal pain. Negative for diarrhea, nausea and vomiting.  Genitourinary: Positive for hematuria. Negative for dysuria, frequency and urgency.  Musculoskeletal: Negative for joint pain and myalgias.  Skin: Negative for rash.       No lesions  Neurological: Negative for speech change, focal weakness and weakness.  Endo/Heme/Allergies: Does not bruise/bleed easily.       No temperature intolerance  Psychiatric/Behavioral: Negative for depression and suicidal ideas.    Blood pressure (!) 160/80, pulse 92, temperature 97.5 F (36.4 C), temperature source Oral, resp. rate 20, height _0  (1.676 m), weight 56.6 kg (124 lb 12.8 oz), SpO2 93 %. Physical Exam  Constitutional: He is oriented to person, place, and time. He appears well-developed and well-nourished. No distress.  HENT:  Head: Normocephalic and atraumatic.  Mouth/Throat: Oropharynx is clear and moist.  Eyes: Conjunctivae and EOM are normal. Pupils are equal, round, and reactive to light. No scleral icterus.  Neck: Normal range of motion. Neck supple. No JVD present. No  tracheal deviation present. No thyromegaly present.  Cardiovascular: Normal rate, regular rhythm and normal heart sounds.  Exam reveals no gallop and no friction rub.   No murmur heard. Respiratory: Effort normal. No respiratory distress. He has wheezes in the right upper field.  GI: Soft. Bowel sounds are normal. He exhibits no distension. There is no tenderness.  Genitourinary:   Genitourinary Comments: Urinary catheter in place  Musculoskeletal: Normal range of motion. He exhibits no edema.  Lymphadenopathy:    He has no cervical adenopathy.  Neurological: He is alert and oriented to person, place, and time. No cranial nerve deficit.  Skin: Skin is warm and dry. No rash noted. No erythema.  Psychiatric: He has a normal mood and affect. His behavior is normal. Judgment and thought content normal.     Assessment/Plan This is a 83 year old male admitted for urinary retention with hematuria. 1. Urinary retention: The patient has BPH and painful bladder spasms. Continuous bladder irrigation. Consult urology. Continue oxybutynin and Pyridium. 2. COPD: The patient has wheezing in his right upper lobe. He is concerned that he is not getting enough relief from his inhalers at home. Continue Spiriva as well as long-acting bronchodilator agonist with inhaled corticosteroid. Albuterol every 4 hours as needed. 3. Coronary artery disease: Stable; continue aspirin and Imdur 4. Hypertension: Controlled; Continue metoprolol and 5. Hyperlipidemia: Continue statin therapy 6. BPH: Continue Flomax 7. DVT prophylaxis: SCDs 8. GI prophylaxis: Pantoprazole per home regimen The patient is a full code. Time spent on admission orders and patient care approximately 45 minutes  Harrie Foreman, MD 08/30/2016, 6:45 AM

## 2016-08-30 NOTE — ED Notes (Signed)
Urine continues to be pink, bladder irrigation running, pt reports decrease pain

## 2016-08-30 NOTE — ED Notes (Signed)
Coude catheter d/c'd per Dr.Webster, 3 way 24 french placed with continuous bladder irrigation, large clots in return, urine is now pink tinged draining

## 2016-08-30 NOTE — Progress Notes (Signed)
Initial Nutrition Assessment  DOCUMENTATION CODES:   Severe malnutrition in context of chronic illness  INTERVENTION:  Provide Ensure Enlive po BID, each supplement provides 350 kcal and 20 grams of protein. Encouraged patient to start drinking his Boost at home BID, as well.  Recommend daily multivitamin with minerals.  Recommend SLP evaluation for recommendation on appropriate diet to prevent aspiration. Patient choking during breakfast this morning.  NUTRITION DIAGNOSIS:   Malnutrition (Severe) related to chronic illness (pain from BPH, urinary retention) as evidenced by severe depletion of body fat, severe depletion of muscle mass.  GOAL:   Patient will meet greater than or equal to 90% of their needs  MONITOR:   PO intake, Supplement acceptance, Labs, Weight trends, I & O's  REASON FOR ASSESSMENT:   Malnutrition Screening Tool    ASSESSMENT:   83 year old male with PMHx of PTSD, HLD, CAD, COPD, anxiety, urinary retention with indwelling Foley catheter placed approximately 1 month ago presents with penile pain, hematuria.   Spoke with patient at bedside. He is known to this RD from previous admission. Patient reports he has had a "lousy" appetite for at least one year now. He reports it is due to his penile pain because it usually occurs when he is trying to eat lunch and dinner. He reports he forces himself to try to eat three meals per day, but often times cannot finish meals. Unable to provide any further details. He drinks Boost every morning. He is amenable to drinking Ensure Enlive while here. Patient edentulous. He denies any trouble chewing/swallowing and has dentures at bedside, which he reports he doesn't need. Denies any N/V, abdominal pain.  Patient reports he has lost approximately 20 lbs in the past 3-4 months. Reports UBW 140 lbs. Per chart patient was 138.7 lbs on 10/19/2015 and has lost 13.9 lbs (10% body weight) over the past 10 months, which is not  significant for time frame.  Medications reviewed and include: vitamin D 1000 units daily, Colace, morphine, pantoprazole, Zofran PRN.  Labs reviewed: on 5/21 Sodium 132, Chloride 99, BUN 29.  Nutrition-Focused physical exam completed. Findings are severe fat depletion, severe muscle depletion, and no edema.   Discussed with RN. Had some choking with breakfast this morning.   Diet Order:  Diet regular Room service appropriate? Yes; Fluid consistency: Thin  Skin:  Reviewed, no issues  Last BM:  Unknown  Height:   Ht Readings from Last 1 Encounters:  08/30/16 5\' 6"  (1.676 m)    Weight:   Wt Readings from Last 1 Encounters:  08/30/16 124 lb 12.8 oz (56.6 kg)    Ideal Body Weight:  61.8 kg  BMI:  Body mass index is 20.14 kg/m.  Estimated Nutritional Needs:   Kcal:  1450-1670 (MSJ x 1.3-1.5)  Protein:  84-95 grams (1.5-1.7 grams/kg)  Fluid:  1.4 L/day (25 ml/kg)  EDUCATION NEEDS:   No education needs identified at this time  Helane RimaLeanne Kelcie Currie, MS, RD, LDN Pager: [phone] After Hours Pager: [phone]

## 2016-08-30 NOTE — Progress Notes (Signed)
Per Dr. Cherlynn KaiserSainani okay to discontinue tele and place order for ensure enlive 3x daily. Per MD okay to give aspirin that was held this AM

## 2016-08-30 NOTE — ED Notes (Signed)
6000 cc bladder irrigation in with 6000 cc in return in foley, pink urine

## 2016-08-30 NOTE — Progress Notes (Signed)
Per MD okay to give pt a breathing treatment early at this time as he got chocked on his breakfast and is short of breath.

## 2016-08-30 NOTE — ED Notes (Signed)
Pt with increased pain, cathter irrigated with several clots in return and pink urine, pt reports pain is better after irrigation

## 2016-08-30 NOTE — Progress Notes (Signed)
[employer] - [place] at [facility]   PATIENT NAME: Daniel Dickson    MR#:  [id number]  DATE OF BIRTH:  20-Nov-1912  SUBJECTIVE:   Patient here due to hematuria and urinary retention and currently on a CBI. Still having some bladder spasms and lower abdominal pain. No other complaints presently.  REVIEW OF SYSTEMS:    Review of Systems  Constitutional: Negative for chills and fever.  HENT: Negative for congestion and tinnitus.   Eyes: Negative for blurred vision and double vision.  Respiratory: Negative for cough, shortness of breath and wheezing.   Cardiovascular: Negative for chest pain, orthopnea and PND.  Gastrointestinal: Positive for abdominal pain. Negative for diarrhea, nausea and vomiting.  Genitourinary: Positive for hematuria. Negative for dysuria.  Neurological: Negative for dizziness, sensory change and focal weakness.  All other systems reviewed and are negative.   Nutrition: Regular Tolerating Diet: Yes Tolerating PT: Await Eval.   DRUG ALLERGIES:   Allergies  Allergen Reactions  . Cortisone Other (See Comments)    Other reaction(s): Other (See Comments) GI Upset Unknown reaction  . Dye Fdc Red [Red Dye] Other (See Comments)    Unknown reaction.  . Iodine (Kelp)  [Iodine]     Other reaction(s): Other (See Comments) GI Upset  . Peanut Oil     Other reaction(s): Unknown  . Peanut-Containing Drug Products     Other reaction(s): Unknown  . Penicillins Rash and Other (See Comments)    GI Upset patient not sure of reaction Has patient had a PCN reaction causing immediate rash, facial/tongue/throat swelling, SOB or lightheadedness with hypotension: Unknown Has patient had a PCN reaction causing severe rash involving mucus membranes or skin necrosis: Unknown Has patient had a PCN reaction that required hospitalization: Unknown Has patient had a PCN reaction occurring within the last 10 years: Unknown If all of the above answers are "NO", then  may proceed with Cephalosporin use.     VITALS:  Blood pressure (!) 110/55, pulse 89, temperature 97.8 F (36.6 C), temperature source Oral, resp. rate 18, height 5\' 6"  (1.676 m), weight 56.6 kg (124 lb 12.8 oz), SpO2 95 %.  PHYSICAL EXAMINATION:   Physical Exam  GENERAL:  83 y.o.-year-old patient lying in bed in no acute distress.  EYES: Pupils equal, round, reactive to light and accommodation. No scleral icterus. Extraocular muscles intact.  HEENT: Head atraumatic, normocephalic. Oropharynx and nasopharynx clear.  NECK:  Supple, no jugular venous distention. No thyroid enlargement, no tenderness.  LUNGS: Normal breath sounds bilaterally, no wheezing, rales, rhonchi. No use of accessory muscles of respiration.  CARDIOVASCULAR: S1, S2 normal. No murmurs, rubs, or gallops.  ABDOMEN: Soft, nontender, nondistended. Bowel sounds present. No organomegaly or mass.  EXTREMITIES: No cyanosis, clubbing or edema b/l.    NEUROLOGIC: Cranial nerves II through XII are intact. No focal Motor or sensory deficits b/l.  Globally weak.  PSYCHIATRIC: The patient is alert and oriented x 3.  SKIN: No obvious rash, lesion, or ulcer.   CBI in place with blood tinged urine in foley bag.   LABORATORY PANEL:   CBC  Recent Labs Lab 08/29/16 2119  WBC 7.9  HGB 14.3  HCT 42.2  PLT 197   ------------------------------------------------------------------------------------------------------------------  Chemistries   Recent Labs Lab 08/29/16 2119  NA 132*  K 4.5  CL 99*  CO2 26  GLUCOSE 91  BUN 29*  CREATININE 1.07  CALCIUM 9.7   ------------------------------------------------------------------------------------------------------------------  Cardiac Enzymes No results for input(s): TROPONINI in the last  168 hours. ------------------------------------------------------------------------------------------------------------------  RADIOLOGY:  Ct Renal Stone Study  Result Date:  08/29/2016 CLINICAL DATA:  Acute onset of gross hematuria and penile pain. Patient has indwelling Foley catheter. Initial encounter. EXAM: CT ABDOMEN AND PELVIS WITHOUT CONTRAST TECHNIQUE: Multidetector CT imaging of the abdomen and pelvis was performed following the standard protocol without IV contrast. COMPARISON:  CT of the pelvis performed 10/24/2015 FINDINGS: Lower chest: Minimal bibasilar scarring is noted. Emphysema is noted at the lung bases. Diffuse coronary artery calcifications are seen. Hepatobiliary: The liver is unremarkable in appearance. The patient is status post cholecystectomy, with clips noted at the gallbladder fossa. The common bile duct remains normal in caliber. Pancreas: The pancreas is within normal limits. Spleen: The spleen is unremarkable in appearance. Adrenals/Urinary Tract: The adrenal glands are unremarkable in appearance. An 8 mm focus of increased attenuation is noted at the interpole region of the right kidney, of uncertain significance. Bilateral renal pelvicaliectasis remains within normal limits. No distal obstructing ureteral stones are seen. No nonobstructing renal stones are identified. Mild perinephric stranding is noted bilaterally. Stomach/Bowel: The stomach is unremarkable in appearance. The small bowel is within normal limits. The appendix is not visualized; there is no evidence for appendicitis. Mild diverticulosis is noted along the descending and sigmoid colon, without evidence of diverticulitis. Vascular/Lymphatic: Diffuse calcification is seen along the abdominal aorta and its branches. The inferior vena cava is grossly unremarkable in appearance. No retroperitoneal or pelvic sidewall lymphadenopathy is seen. Reproductive: The bladder is moderately distended, with a Foley catheter in place. A large amount of clot is noted at the inferior aspect of the bladder. The prostate is enlarged, measuring 5.6 cm in transverse dimension. Other: A prominent lipoma is noted  along the right lower quadrant anterior abdominal wall. Musculoskeletal: No acute osseous abnormalities are identified. The patient's right proximal femoral hardware is grossly unremarkable. Chronic compression deformity is noted at vertebral body L2. The visualized musculature is unremarkable in appearance. IMPRESSION: 1. Large amount of clot noted at the inferior aspect of the bladder. This is of uncertain etiology. It may reflect some degree of trauma to the prostate. Cystoscopy could be considered for further evaluation, as deemed clinically appropriate. Foley catheter is grossly unremarkable in appearance. 2. Enlarged prostate noted. 3. 8 mm nonspecific focus of increased attenuation at the interpole region of the right kidney, without a dominant mass. 4. Mild diverticulosis along the descending and sigmoid colon, without evidence of diverticulitis. 5. Diffuse aortic atherosclerosis. 6. Prominent lipoma along the right lower quadrant anterior abdominal wall. 7. Emphysema at the lung bases. 8. Diffuse coronary artery calcifications seen. 9. Chronic compression deformity of vertebral body L2. Electronically Signed   By: Roanna Raider M.D.   On: 08/29/2016 22:27     ASSESSMENT AND PLAN:   83 year old male with past medical history of PTSD, COPD, hypertension, hyperlipidemia, BPH who presented to the hospital due to hematuria and urinary retention.  1. Hematuria/urinary retention-secondary to BPH. -Seen by urology and currently on a CBI and hematuria is improving. Hemoglobin stable. -Continue finasteride, Flomax. -Continue Pyridium, Ditropan for bladder spasms. Appreciate urology input.  2. Essential hypertension-continue imdur, Toprol  3. Hyperlipidemia - cont. Pravachol  4. COPD - cont. Spiriva. NO acute exacerbation.   5. GERD - cont. Protonix.   6. Hyperlipidemia - cont. Pravachol.     All the records are reviewed and case discussed with Care Management/Social Worker. Management  plans discussed with the patient, family and they are in agreement.  CODE STATUS: Full  code  DVT Prophylaxis: Ted's & SCD's  TOTAL TIME TAKING CARE OF THIS PATIENT: 30 minutes.   POSSIBLE D/C IN 1-2 DAYS, DEPENDING ON CLINICAL CONDITION.   [place] SirenSAINANI,VIVEK J M.D on 08/30/2016 at 3:36 PM  Between 7am to 6pm - Pager - [phone]  After 6pm go to [email or web address] - Social research officer, governmentpassword EPAS [email or web address]  [facility] [place] [facility]  Office  [phone]  CC: Primary care physician; Dortha KernBliss, Laura K, MD

## 2016-08-30 NOTE — Care Management (Signed)
Patient admitted from home with Urinary retention.  Patient has chronic foley in place.  Patient lives at home with his son who has Downs syndrome.  Patient daughter Dennie Bibleat lives next door.  [facility]  [facility]usal to transfer completed and [facility].  [facility]Laurie with [facility] notified [facility].  [facility] obtains medications from [facility].  Patient has walker, WC, and cane in the home for ambulation.  Per daughter patient has an aide that comes 3 times a week and bathes the patient through a company called [employer]".  [facility]  [facility] [place] [facility] to [facility].  RNCM following.

## 2016-08-30 NOTE — ED Provider Notes (Signed)
-----------------------------------------   12:51 AM on 08/30/2016 -----------------------------------------   Blood pressure (!) 159/81, pulse (!) 103, temperature 98 F (36.7 C), temperature source Oral, resp. rate (!) 21, height 5\' 6"  (1.676 m), weight 57.2 kg (126 lb), SpO2 97 %.  Assuming care from Dr. Alphonzo LemmingsMcShane.  In short, Daniel Dickson is a [age] y.o. male with a chief complaint of Hematuria .  Refer to the original H&Dickson for additional details.  The current plan of care is to reassess the patient after bladder irrigation.  The patient is still passing lots of clots with bladder irrigation. He needs to be admitted for continuous bladder irrigation. We will place a 3-way catheter in the patient so he can receive continuous bladder irrigation.        Daniel Dickson, Daniel Rowzee P, MD 08/30/16 [phone]

## 2016-08-31 DIAGNOSIS — R339 Retention of urine, unspecified: Secondary | ICD-10-CM

## 2016-08-31 DIAGNOSIS — R310 Gross hematuria: Secondary | ICD-10-CM

## 2016-08-31 LAB — HEMOGLOBIN A1C
Hgb A1c MFr Bld: 5 % (ref 4.8–5.6)
Mean Plasma Glucose: 97 mg/dL

## 2016-08-31 LAB — HEMOGLOBIN: Hemoglobin: 13.4 g/dL (ref 13.0–18.0)

## 2016-08-31 MED ORDER — ENOXAPARIN SODIUM 30 MG/0.3ML ~~LOC~~ SOLN: 30.0000 mg | SUBCUTANEOUS | Status: DC

## 2016-08-31 MED ORDER — BELLADONNA ALKALOIDS-OPIUM 16.2-60 MG RE SUPP
1.0000 | Freq: Once | RECTAL | Status: AC
  Administered 2016-08-31: 1 via RECTAL
  Filled 2016-08-31: qty 1

## 2016-08-31 NOTE — Progress Notes (Signed)
Notified Dr. Apolinar JunesBrandon of attempt to irrigate foley. When flushing foley was not able to get all of sterile water to come back. Per MD this is okay. Per MD okay to place order for B and O suppository x 1.

## 2016-08-31 NOTE — Progress Notes (Signed)
Notified Dr. Desiree HaneVachani of BP of 80/40 manual. Pt has no complaints at this time. Per MD do not give bolus continue to monitor and do not give any BP meds tonight if needed. Per MD recheck BP in a couple hours.

## 2016-08-31 NOTE — Care Management (Signed)
[facility], OT, and speech. Will require resumption of care orders.

## 2016-08-31 NOTE — Progress Notes (Signed)
Per Dr. Cherlynn KaiserSainani turn off CBI as Dr. Apolinar JunesBrandon is to come see the pt this afternoon.

## 2016-08-31 NOTE — Progress Notes (Signed)
[employer] - [place] at [place]   PATIENT NAME: Daniel Dickson    MR#:  [id number]  DATE OF BIRTH:  1912/08/02  SUBJECTIVE:   Patient here due to hematuria and urinary retention and currently on a CBI. Patient's abdominal pain and hematuria is significantly improved. He feels better.  REVIEW OF SYSTEMS:    Review of Systems  Constitutional: Negative for chills and fever.  HENT: Negative for congestion and tinnitus.   Eyes: Negative for blurred vision and double vision.  Respiratory: Negative for cough, shortness of breath and wheezing.   Cardiovascular: Negative for chest pain, orthopnea and PND.  Gastrointestinal: Positive for abdominal pain. Negative for diarrhea, nausea and vomiting.  Genitourinary: Positive for hematuria. Negative for dysuria.  Neurological: Negative for dizziness, sensory change and focal weakness.  All other systems reviewed and are negative.   Nutrition: Regular Tolerating Diet: Yes Tolerating PT: Await Eval.   DRUG ALLERGIES:   Allergies  Allergen Reactions  . Cortisone Other (See Comments)    Other reaction(s): Other (See Comments) GI Upset Unknown reaction  . Dye Fdc Red [Red Dye] Other (See Comments)    Unknown reaction.  . Iodine (Kelp)  [Iodine]     Other reaction(s): Other (See Comments) GI Upset  . Peanut Oil     Other reaction(s): Unknown  . Peanut-Containing Drug Products     Other reaction(s): Unknown  . Penicillins Rash and Other (See Comments)    GI Upset patient not sure of reaction Has patient had a PCN reaction causing immediate rash, facial/tongue/throat swelling, SOB or lightheadedness with hypotension: Unknown Has patient had a PCN reaction causing severe rash involving mucus membranes or skin necrosis: Unknown Has patient had a PCN reaction that required hospitalization: Unknown Has patient had a PCN reaction occurring within the last 10 years: Unknown If all of the above answers are "NO", then may  proceed with Cephalosporin use.     VITALS:  Blood pressure (!) 96/53, pulse 80, temperature 98.2 F (36.8 C), temperature source Oral, resp. rate 14, height 5\' 6"  (1.676 m), weight 60 kg (132 lb 3.2 oz), SpO2 93 %.  PHYSICAL EXAMINATION:   Physical Exam  GENERAL:  83 y.o.-year-old patient lying in bed in no acute distress.  EYES: Pupils equal, round, reactive to light and accommodation. No scleral icterus. Extraocular muscles intact.  HEENT: Head atraumatic, normocephalic. Oropharynx and nasopharynx clear.  NECK:  Supple, no jugular venous distention. No thyroid enlargement, no tenderness.  LUNGS: Normal breath sounds bilaterally, no wheezing, rales, rhonchi. No use of accessory muscles of respiration.  CARDIOVASCULAR: S1, S2 normal. No murmurs, rubs, or gallops.  ABDOMEN: Soft, nontender, nondistended. Bowel sounds present. No organomegaly or mass.  EXTREMITIES: No cyanosis, clubbing or edema b/l.    NEUROLOGIC: Cranial nerves II through XII are intact. No focal Motor or sensory deficits b/l.  Globally weak.  PSYCHIATRIC: The patient is alert and oriented x 3.  SKIN: No obvious rash, lesion, or ulcer.   CBI in place with yellow urine in foley bag.   LABORATORY PANEL:   CBC  Recent Labs Lab 08/29/16 2119 08/31/16 1042  WBC 7.9  --   HGB 14.3 13.4  HCT 42.2  --   PLT 197  --    ------------------------------------------------------------------------------------------------------------------  Chemistries   Recent Labs Lab 08/29/16 2119  NA 132*  K 4.5  CL 99*  CO2 26  GLUCOSE 91  BUN 29*  CREATININE 1.07  CALCIUM 9.7   ------------------------------------------------------------------------------------------------------------------  Cardiac  Enzymes No results for input(s): TROPONINI in the last 168 hours. ------------------------------------------------------------------------------------------------------------------  RADIOLOGY:  Ct Renal Stone  Study  Result Date: 08/29/2016 CLINICAL DATA:  Acute onset of gross hematuria and penile pain. Patient has indwelling Foley catheter. Initial encounter. EXAM: CT ABDOMEN AND PELVIS WITHOUT CONTRAST TECHNIQUE: Multidetector CT imaging of the abdomen and pelvis was performed following the standard protocol without IV contrast. COMPARISON:  CT of the pelvis performed 10/24/2015 FINDINGS: Lower chest: Minimal bibasilar scarring is noted. Emphysema is noted at the lung bases. Diffuse coronary artery calcifications are seen. Hepatobiliary: The liver is unremarkable in appearance. The patient is status post cholecystectomy, with clips noted at the gallbladder fossa. The common bile duct remains normal in caliber. Pancreas: The pancreas is within normal limits. Spleen: The spleen is unremarkable in appearance. Adrenals/Urinary Tract: The adrenal glands are unremarkable in appearance. An 8 mm focus of increased attenuation is noted at the interpole region of the right kidney, of uncertain significance. Bilateral renal pelvicaliectasis remains within normal limits. No distal obstructing ureteral stones are seen. No nonobstructing renal stones are identified. Mild perinephric stranding is noted bilaterally. Stomach/Bowel: The stomach is unremarkable in appearance. The small bowel is within normal limits. The appendix is not visualized; there is no evidence for appendicitis. Mild diverticulosis is noted along the descending and sigmoid colon, without evidence of diverticulitis. Vascular/Lymphatic: Diffuse calcification is seen along the abdominal aorta and its branches. The inferior vena cava is grossly unremarkable in appearance. No retroperitoneal or pelvic sidewall lymphadenopathy is seen. Reproductive: The bladder is moderately distended, with a Foley catheter in place. A large amount of clot is noted at the inferior aspect of the bladder. The prostate is enlarged, measuring 5.6 cm in transverse dimension. Other: A  prominent lipoma is noted along the right lower quadrant anterior abdominal wall. Musculoskeletal: No acute osseous abnormalities are identified. The patient's right proximal femoral hardware is grossly unremarkable. Chronic compression deformity is noted at vertebral body L2. The visualized musculature is unremarkable in appearance. IMPRESSION: 1. Large amount of clot noted at the inferior aspect of the bladder. This is of uncertain etiology. It may reflect some degree of trauma to the prostate. Cystoscopy could be considered for further evaluation, as deemed clinically appropriate. Foley catheter is grossly unremarkable in appearance. 2. Enlarged prostate noted. 3. 8 mm nonspecific focus of increased attenuation at the interpole region of the right kidney, without a dominant mass. 4. Mild diverticulosis along the descending and sigmoid colon, without evidence of diverticulitis. 5. Diffuse aortic atherosclerosis. 6. Prominent lipoma along the right lower quadrant anterior abdominal wall. 7. Emphysema at the lung bases. 8. Diffuse coronary artery calcifications seen. 9. Chronic compression deformity of vertebral body L2. Electronically Signed   By: Roanna Raider M.D.   On: 08/29/2016 22:27     ASSESSMENT AND PLAN:   83 year old male with past medical history of PTSD, COPD, hypertension, hyperlipidemia, BPH who presented to the hospital due to hematuria and urinary retention.  1. Hematuria/urinary retention-secondary to BPH. -Seen by urology and currently on a CBI and hematuria is improving. Hemoglobin stable. -Continue finasteride, Flomax. -Continue Pyridium, Ditropan for bladder spasms.  -Discussed with urology and plan to discontinue bladder irrigation today and possibly discharge tomorrow if doing well.  2. Essential hypertension-continue imdur, Toprol  3. Hyperlipidemia - cont. Pravachol  4. COPD - cont. Spiriva. NO acute exacerbation.   5. GERD - cont. Protonix.   6. Hyperlipidemia -  cont. Pravachol.     All the records are  reviewed and case discussed with Care Management/Social Worker. Management plans discussed with the patient, family and they are in agreement.  CODE STATUS: Full code  DVT Prophylaxis: Ted's & SCD's  TOTAL TIME TAKING CARE OF THIS PATIENT: 30 minutes.   POSSIBLE D/C IN 1-2 DAYS, DEPENDING ON CLINICAL CONDITION.   [place] SirenSAINANI,Purity Irmen J M.D on 08/31/2016 at 2:35 PM  Between 7am to 6pm - Pager - [phone]  After 6pm go to [email or web address] - Social research officer, governmentpassword EPAS [facility]  [facility] [place] [facility]  Office  [phone]  CC: Primary care physician; Dortha KernBliss, Laura K, MD

## 2016-08-31 NOTE — Progress Notes (Signed)
Urology Consult Follow Up  Subjective: Patient seen and examined this evening. CBI was stopped several hours ago and urine remains orange tinged consistent with previous peridium administration. Patient reports that since his CBI was stopped, he has been having increased frequency of bladder spasms. Catheter did flush easily with return of nearly all of the urine injected into catheter without clots. Anal suppository was administered with some improvement.  Anti-infectives: Anti-infectives    None      Current Facility-Administered Medications  Medication Dose Route Frequency Provider Last Rate Last Dose  . acetaminophen (TYLENOL) tablet 650 mg  650 mg Oral Q6H PRN Arnaldo Natal, MD       Or  . acetaminophen (TYLENOL) suppository 650 mg  650 mg Rectal Q6H PRN Arnaldo Natal, MD      . albuterol (PROVENTIL) (2.5 MG/3ML) 0.083% nebulizer solution 2.5 mg  2.5 mg Nebulization Q4H PRN Arnaldo Natal, MD   2.5 mg at 08/31/16 1545  . antiseptic oral rinse (BIOTENE) solution 15 mL  15 mL Mouth Rinse PRN Arnaldo Natal, MD      . aspirin EC tablet 81 mg  81 mg Oral Daily Arnaldo Natal, MD   81 mg at 08/31/16 1030  . benzonatate (TESSALON) capsule 100 mg  100 mg Oral Q6H PRN Arnaldo Natal, MD      . cholecalciferol (VITAMIN D) tablet 1,000 Units  1,000 Units Oral Daily Arnaldo Natal, MD   1,000 Units at 08/31/16 1029  . docusate sodium (COLACE) capsule 100 mg  100 mg Oral BID Arnaldo Natal, MD   100 mg at 08/31/16 1030  . feeding supplement (ENSURE ENLIVE) (ENSURE ENLIVE) liquid 237 mL  237 mL Oral BID BM Houston Siren, MD   237 mL at 08/31/16 1315  . finasteride (PROSCAR) tablet 5 mg  5 mg Oral Daily Arnaldo Natal, MD   5 mg at 08/31/16 1030  . fluticasone furoate-vilanterol (BREO ELLIPTA) 100-25 MCG/INH 1 puff  1 puff Inhalation Daily Arnaldo Natal, MD   1 puff at 08/31/16 1030  . isosorbide mononitrate (IMDUR) 24 hr tablet 30 mg  30 mg Oral Daily  Arnaldo Natal, MD   30 mg at 08/30/16 1025  . loratadine (CLARITIN) tablet 10 mg  10 mg Oral Daily Arnaldo Natal, MD   10 mg at 08/31/16 1030  . Melatonin TABS 10 mg  10 mg Oral QHS Arnaldo Natal, MD   10 mg at 08/30/16 2108  . metoprolol succinate (TOPROL-XL) 24 hr tablet 25 mg  25 mg Oral Daily Arnaldo Natal, MD   25 mg at 08/31/16 1029  . morphine 4 MG/ML injection 4 mg  4 mg Intravenous Q4H PRN Arnaldo Natal, MD   4 mg at 08/30/16 0415  . multivitamin with minerals tablet 1 tablet  1 tablet Oral Daily Houston Siren, MD   1 tablet at 08/31/16 1030  . nitroGLYCERIN (NITROSTAT) SL tablet 0.4 mg  0.4 mg Sublingual Q5 min PRN Arnaldo Natal, MD      . ondansetron [facility]) tablet 4 mg  4 mg Oral Q6H PRN Arnaldo Natal, MD       Or  . ondansetron [facility]) injection 4 mg  4 mg Intravenous Q6H PRN Arnaldo Natal, MD   4 mg at 08/30/16 0551  . oxybutynin (DITROPAN-XL) 24 hr tablet 5 mg  5 mg Oral QHS Arnaldo Natal, MD   5 mg at  08/30/16 2108  . pantoprazole (PROTONIX) EC tablet 40 mg  40 mg Oral Daily Arnaldo Nataliamond, Michael S, MD   40 mg at 08/31/16 1029  . phenazopyridine (PYRIDIUM) tablet 100 mg  100 mg Oral TID WC Arnaldo Nataliamond, Michael S, MD   100 mg at 08/31/16 1737  . pravastatin (PRAVACHOL) tablet 40 mg  40 mg Oral Daily Arnaldo Nataliamond, Michael S, MD   40 mg at 08/31/16 1732  . tamsulosin (FLOMAX) capsule 0.4 mg  0.4 mg Oral Daily Arnaldo Nataliamond, Michael S, MD   0.4 mg at 08/31/16 1030  . tiotropium (SPIRIVA) inhalation capsule 18 mcg  18 mcg Inhalation QPM Arnaldo Nataliamond, Michael S, MD   18 mcg at 08/30/16 1722     Objective: Vital signs in last 24 hours: Temp:  [98.2 F (36.8 C)-98.4 F (36.9 C)] 98.2 F (36.8 C) (05/23 1402) Pulse Rate:  [80-90] 80 (05/23 1402) Resp:  [14-20] 14 (05/23 1402) BP: (96-140)/(53-69) 96/53 (05/23 1402) SpO2:  [91 %-96 %] 93 % (05/23 1402) Weight:  [132 lb 3.2 oz (60 kg)] 132 lb 3.2 oz (60 kg) (05/23 0508)  Intake/Output from previous  day: 05/22 0701 - 05/23 0700 In: [id number] [P.O.:1390] Out: 22675 [Urine:22675] Intake/Output this shift: Total I/O In: 3240 [P.O.:240; Other:3000] Out: 4925 [Urine:4925]   Physical Exam Constitutional: He appears well-developed and well-nourished.  Frail, elderly, but very mentally astute.  HENT:  Head: Normocephalic.  Eyes: Pupils are equal, round, and reactive to light.  Neck: Normal range of motion.  Cardiovascular: Normal rate.   Respiratory: Effort normal.  GI: Soft.  Genitourinary: Penis normal.  Genitourinary Comments: 52F 3 way catheter draining orange tinged urine.  Musculoskeletal: Normal range of motion.  Neurological: He is alert.  Skin: Skin is warm.  Psychiatric: He has a normal mood and affect.   Lab Results:   Recent Labs  08/29/16 2119 08/31/16 1042  WBC 7.9  --   HGB 14.3 13.4  HCT 42.2  --   PLT 197  --    BMET  Recent Labs  08/29/16 2119  NA 132*  K 4.5  CL 99*  CO2 26  GLUCOSE 91  BUN 29*  CREATININE 1.07  CALCIUM 9.7   PT/INR No results for input(s): LABPROT, INR in the last 72 hours. ABG No results for input(s): PHART, HCO3 in the last 72 hours.  Invalid input(s): PCO2, PO2  Studies/Results: Ct Renal Stone Study  Result Date: 08/29/2016 CLINICAL DATA:  Acute onset of gross hematuria and penile pain. Patient has indwelling Foley catheter. Initial encounter. EXAM: CT ABDOMEN AND PELVIS WITHOUT CONTRAST TECHNIQUE: Multidetector CT imaging of the abdomen and pelvis was performed following the standard protocol without IV contrast. COMPARISON:  CT of the pelvis performed 10/24/2015 FINDINGS: Lower chest: Minimal bibasilar scarring is noted. Emphysema is noted at the lung bases. Diffuse coronary artery calcifications are seen. Hepatobiliary: The liver is unremarkable in appearance. The patient is status post cholecystectomy, with clips noted at the gallbladder fossa. The common bile duct remains normal in caliber. Pancreas: The pancreas is  within normal limits. Spleen: The spleen is unremarkable in appearance. Adrenals/Urinary Tract: The adrenal glands are unremarkable in appearance. An 8 mm focus of increased attenuation is noted at the interpole region of the right kidney, of uncertain significance. Bilateral renal pelvicaliectasis remains within normal limits. No distal obstructing ureteral stones are seen. No nonobstructing renal stones are identified. Mild perinephric stranding is noted bilaterally. Stomach/Bowel: The stomach is unremarkable in appearance. The small bowel is within normal  limits. The appendix is not visualized; there is no evidence for appendicitis. Mild diverticulosis is noted along the descending and sigmoid colon, without evidence of diverticulitis. Vascular/Lymphatic: Diffuse calcification is seen along the abdominal aorta and its branches. The inferior vena cava is grossly unremarkable in appearance. No retroperitoneal or pelvic sidewall lymphadenopathy is seen. Reproductive: The bladder is moderately distended, with a Foley catheter in place. A large amount of clot is noted at the inferior aspect of the bladder. The prostate is enlarged, measuring 5.6 cm in transverse dimension. Other: A prominent lipoma is noted along the right lower quadrant anterior abdominal wall. Musculoskeletal: No acute osseous abnormalities are identified. The patient's right proximal femoral hardware is grossly unremarkable. Chronic compression deformity is noted at vertebral body L2. The visualized musculature is unremarkable in appearance. IMPRESSION: 1. Large amount of clot noted at the inferior aspect of the bladder. This is of uncertain etiology. It may reflect some degree of trauma to the prostate. Cystoscopy could be considered for further evaluation, as deemed clinically appropriate. Foley catheter is grossly unremarkable in appearance. 2. Enlarged prostate noted. 3. 8 mm nonspecific focus of increased attenuation at the interpole region of  the right kidney, without a dominant mass. 4. Mild diverticulosis along the descending and sigmoid colon, without evidence of diverticulitis. 5. Diffuse aortic atherosclerosis. 6. Prominent lipoma along the right lower quadrant anterior abdominal wall. 7. Emphysema at the lung bases. 8. Diffuse coronary artery calcifications seen. 9. Chronic compression deformity of vertebral body L2. Electronically Signed   By: Roanna Raider M.D.   On: 08/29/2016 22:27    Assessment/Plan:  1 - Chronic Urinary Retention / Large Prostate - catheter canged today for hematuria typ large bore to help manage hematuria below. He is not surgical candidate for outlet procedure.   2 - Gross Hematuria - this is most certainly from minor catheter trauma in setting of large prostate. Imaging otherwise favorable. CBI stopped and hematuria appears to have resolved. Catheter removed from traction this evening. Hand irrigate as needed overnight.  3- Bladder spasms-  patient administered a B&O suppository 1. Removing catheter from traction may help with this sensation. If symptoms continue, would try Mybetriq 25 mg in order to avoid anticholinergic medication in this age group.    LOS: 1 day    Vanna Scotland 08/31/2016

## 2016-08-31 NOTE — Progress Notes (Signed)
Per Dr. Cherlynn KaiserSainani give pt metoprolol and hold Imdur. Will continue to monitor.

## 2016-09-01 DIAGNOSIS — R310 Gross hematuria: Secondary | ICD-10-CM

## 2016-09-01 MED ORDER — BELLADONNA ALKALOIDS-OPIUM 16.2-60 MG RE SUPP
1.0000 | Freq: Once | RECTAL | Status: AC
  Administered 2016-09-01: 1 via RECTAL
  Filled 2016-09-01: qty 1

## 2016-09-01 NOTE — Progress Notes (Signed)
Notified Dr. Cherlynn KaiserSainani that pt is still complaining of bladders spasms and per MD okay to place 1 time order for B and O suppository. Pt not to be discharged today.

## 2016-09-01 NOTE — Progress Notes (Signed)
[facility] [place] [facility]   PATIENT NAME: Daniel Dickson    MR#:  [id number]  DATE OF BIRTH:  Jul 29, 1912  SUBJECTIVE:   Patient here due to hematuria and urinary retention and currently on a CBI.  CBI has been discontinued and Hematuria improved.  Still having pain and bladder spasms.  Hypotensive by asymptomatic.   REVIEW OF SYSTEMS:    Review of Systems  Constitutional: Negative for chills and fever.  HENT: Negative for congestion and tinnitus.   Eyes: Negative for blurred vision and double vision.  Respiratory: Negative for cough, shortness of breath and wheezing.   Cardiovascular: Negative for chest pain, orthopnea and PND.  Gastrointestinal: Positive for abdominal pain. Negative for diarrhea, nausea and vomiting.  Genitourinary: Negative for dysuria and hematuria.  Neurological: Negative for dizziness, sensory change and focal weakness.  All other systems reviewed and are negative.   Nutrition: Regular Tolerating Diet: Yes Tolerating PT: Await Eval.   DRUG ALLERGIES:   Allergies  Allergen Reactions  . Cortisone Other (See Comments)    Other reaction(s): Other (See Comments) GI Upset Unknown reaction  . Dye Fdc Red [Red Dye] Other (See Comments)    Unknown reaction.  . Iodine (Kelp)  [Iodine]     Other reaction(s): Other (See Comments) GI Upset  . Peanut Oil     Other reaction(s): Unknown  . Peanut-Containing Drug Products     Other reaction(s): Unknown  . Penicillins Rash and Other (See Comments)    GI Upset patient not sure of reaction Has patient had a PCN reaction causing immediate rash, facial/tongue/throat swelling, SOB or lightheadedness with hypotension: Unknown Has patient had a PCN reaction causing severe rash involving mucus membranes or skin necrosis: Unknown Has patient had a PCN reaction that required hospitalization: Unknown Has patient had a PCN reaction occurring within the last 10 years: Unknown If all of the above  answers are "NO", then may proceed with Cephalosporin use.     VITALS:  Blood pressure (!) 80/40, pulse 81, temperature 97.3 F (36.3 C), temperature source Oral, resp. rate 20, height 5\' 6"  (1.676 m), weight 56.3 kg (124 lb 3.2 oz), SpO2 92 %.  PHYSICAL EXAMINATION:   Physical Exam  GENERAL:  83 y.o.-year-old patient lying in bed in no acute distress.  EYES: Pupils equal, round, reactive to light and accommodation. No scleral icterus. Extraocular muscles intact.  HEENT: Head atraumatic, normocephalic. Oropharynx and nasopharynx clear.  NECK:  Supple, no jugular venous distention. No thyroid enlargement, no tenderness.  LUNGS: Normal breath sounds bilaterally, no wheezing, rales, rhonchi. No use of accessory muscles of respiration.  CARDIOVASCULAR: S1, S2 normal. No murmurs, rubs, or gallops.  ABDOMEN: Soft, nontender, nondistended. Bowel sounds present. No organomegaly or mass.  EXTREMITIES: No cyanosis, clubbing or edema b/l.    NEUROLOGIC: Cranial nerves II through XII are intact. No focal Motor or sensory deficits b/l.  Globally weak.  PSYCHIATRIC: The patient is alert and oriented x 3.  SKIN: No obvious rash, lesion, or ulcer.   Foley cath in place w/ Yellow urine draining.   LABORATORY PANEL:   CBC  Recent Labs Lab 08/29/16 2119 08/31/16 1042  WBC 7.9  --   HGB 14.3 13.4  HCT 42.2  --   PLT 197  --    ------------------------------------------------------------------------------------------------------------------  Chemistries   Recent Labs Lab 08/29/16 2119  NA 132*  K 4.5  CL 99*  CO2 26  GLUCOSE 91  BUN 29*  CREATININE 1.07  CALCIUM 9.7   ------------------------------------------------------------------------------------------------------------------  Cardiac Enzymes No results for input(s): TROPONINI in the last 168  hours. ------------------------------------------------------------------------------------------------------------------  RADIOLOGY:  No results found.   ASSESSMENT AND PLAN:   83 year old male with past medical history of PTSD, COPD, hypertension, hyperlipidemia, BPH who presented to the hospital due to hematuria and urinary retention.  1. Hematuria/urinary retention-secondary to BPH. -Seen by urology and was on CBI but now discontinued and cont. W/ Foley. Hematuria improved and Hg. Stable.  -Continue finasteride, Flomax. -Continue Pyridium, Ditropan for bladder spasms.  - cont. B & O suppository for discomfort and bladder spasms.  Appreciate Urology input and o.k. To d/c from their standpoint.   2. Essential hypertension- BP on low side.  - will d/c anti-HTN for now.   3. Hyperlipidemia - cont. Pravachol  4. COPD - cont. Spiriva. NO acute exacerbation.   5. GERD - cont. Protonix.   6. Hyperlipidemia - cont. Pravachol.   Possible d/c home tomorrow if feeling better.   All the records are reviewed and case discussed with Care Management/Social Worker. Management plans discussed with the patient, family and they are in agreement.  CODE STATUS: Full code  DVT Prophylaxis: Ted's & SCD's  TOTAL TIME TAKING CARE OF THIS PATIENT: 25 minutes.   POSSIBLE D/C IN 1-2 DAYS, DEPENDING ON CLINICAL CONDITION.   Houston SirenSAINANI,Soniya Ashraf J M.D on 09/01/2016 at 1:29 PM  Between 7am to 6pm - Pager - [phone]  After 6pm go to [email or web address] - Social research officer, governmentpassword [email or web address] [email or web address]  [facility] [place] [facility]  Office  [phone]  CC: Primary care physician; Dortha KernBliss, Laura K, MD

## 2016-09-01 NOTE — Progress Notes (Signed)
Urology Consult Follow Up  Subjective: No complaints this morning. Foley draining well overnight. Orange tinged urine.  Anti-infectives: Anti-infectives    None      Current Facility-Administered Medications  Medication Dose Route Frequency Provider Last Rate Last Dose  . acetaminophen (TYLENOL) tablet 650 mg  650 mg Oral Q6H PRN Arnaldo Natal, MD       Or  . acetaminophen (TYLENOL) suppository 650 mg  650 mg Rectal Q6H PRN Arnaldo Natal, MD      . albuterol (PROVENTIL) (2.5 MG/3ML) 0.083% nebulizer solution 2.5 mg  2.5 mg Nebulization Q4H PRN Arnaldo Natal, MD   2.5 mg at 09/01/16 4098  . antiseptic oral rinse (BIOTENE) solution 15 mL  15 mL Mouth Rinse PRN Arnaldo Natal, MD      . aspirin EC tablet 81 mg  81 mg Oral Daily Arnaldo Natal, MD   81 mg at 09/01/16 1033  . benzonatate (TESSALON) capsule 100 mg  100 mg Oral Q6H PRN Arnaldo Natal, MD      . cholecalciferol (VITAMIN D) tablet 1,000 Units  1,000 Units Oral Daily Arnaldo Natal, MD   1,000 Units at 09/01/16 1033  . docusate sodium (COLACE) capsule 100 mg  100 mg Oral BID Arnaldo Natal, MD   100 mg at 09/01/16 1033  . feeding supplement (ENSURE ENLIVE) (ENSURE ENLIVE) liquid 237 mL  237 mL Oral BID BM Houston Siren, MD   237 mL at 09/01/16 1032  . finasteride (PROSCAR) tablet 5 mg  5 mg Oral Daily Arnaldo Natal, MD   5 mg at 09/01/16 1033  . fluticasone furoate-vilanterol (BREO ELLIPTA) 100-25 MCG/INH 1 puff  1 puff Inhalation Daily Arnaldo Natal, MD   1 puff at 09/01/16 1032  . isosorbide mononitrate (IMDUR) 24 hr tablet 30 mg  30 mg Oral Daily Arnaldo Natal, MD   30 mg at 09/01/16 1033  . loratadine (CLARITIN) tablet 10 mg  10 mg Oral Daily Arnaldo Natal, MD   10 mg at 09/01/16 1033  . Melatonin TABS 10 mg  10 mg Oral QHS Arnaldo Natal, MD   10 mg at 08/31/16 2130  . metoprolol succinate (TOPROL-XL) 24 hr tablet 25 mg  25 mg Oral Daily Arnaldo Natal, MD   25  mg at 09/01/16 1032  . morphine 4 MG/ML injection 4 mg  4 mg Intravenous Q4H PRN Arnaldo Natal, MD   4 mg at 08/30/16 0415  . multivitamin with minerals tablet 1 tablet  1 tablet Oral Daily Houston Siren, MD   1 tablet at 09/01/16 1032  . nitroGLYCERIN (NITROSTAT) SL tablet 0.4 mg  0.4 mg Sublingual Q5 min PRN Arnaldo Natal, MD      . ondansetron [facility]) tablet 4 mg  4 mg Oral Q6H PRN Arnaldo Natal, MD       Or  . ondansetron [facility]) injection 4 mg  4 mg Intravenous Q6H PRN Arnaldo Natal, MD   4 mg at 08/30/16 0551  . oxybutynin (DITROPAN-XL) 24 hr tablet 5 mg  5 mg Oral QHS Arnaldo Natal, MD   5 mg at 08/31/16 2130  . pantoprazole (PROTONIX) EC tablet 40 mg  40 mg Oral Daily Arnaldo Natal, MD   40 mg at 09/01/16 1033  . phenazopyridine (PYRIDIUM) tablet 100 mg  100 mg Oral TID WC Arnaldo Natal, MD   100 mg at 09/01/16 0800  .  pravastatin (PRAVACHOL) tablet 40 mg  40 mg Oral Daily Arnaldo Nataliamond, Michael S, MD   40 mg at 08/31/16 1732  . tamsulosin (FLOMAX) capsule 0.4 mg  0.4 mg Oral Daily Arnaldo Nataliamond, Michael S, MD   0.4 mg at 09/01/16 1033  . tiotropium (SPIRIVA) inhalation capsule 18 mcg  18 mcg Inhalation QPM Arnaldo Nataliamond, Michael S, MD   18 mcg at 08/31/16 2131     Objective: Vital signs in last 24 hours: Temp:  [97.5 F (36.4 C)-98.2 F (36.8 C)] 97.5 F (36.4 C) (05/24 0604) Pulse Rate:  [75-88] 79 (05/24 1032) Resp:  [14-20] 20 (05/24 0604) BP: (80-128)/(40-60) 128/60 (05/24 1032) SpO2:  [90 %-93 %] 90 % (05/24 0604) Weight:  [124 lb 3.2 oz (56.3 kg)] 124 lb 3.2 oz (56.3 kg) (05/24 0500)  Intake/Output from previous day: 05/23 0701 - 05/24 0700 In: 3480 [P.O.:480] Out: 5375 [Urine:5375] Intake/Output this shift: Total I/O In: 240 [P.O.:240] Out: 0    Physical Exam Constitutional: He appears well-developed and well-nourished.  Frail, elderly, but very mentally astute.  HENT:  Head: Normocephalic.  Eyes: Pupils are equal, round, and reactive  to light.  Neck: Normal range of motion.  Cardiovascular: Normal rate.   Respiratory: Effort normal.  GI: Soft.  Genitourinary: Penis normal.  Genitourinary Comments: 106F 3 way catheter draining orange tinged urine.  Musculoskeletal: Normal range of motion.  Neurological: He is alert.  Skin: Skin is warm.  Psychiatric: He has a normal mood and affect.   Lab Results:   Recent Labs  08/29/16 2119 08/31/16 1042  WBC 7.9  --   HGB 14.3 13.4  HCT 42.2  --   PLT 197  --    BMET  Recent Labs  08/29/16 2119  NA 132*  K 4.5  CL 99*  CO2 26  GLUCOSE 91  BUN 29*  CREATININE 1.07  CALCIUM 9.7    Assessment/Plan:  1 - Chronic Urinary Retention / Large Prostate - He is not surgical candidate for outlet procedure.   Maintain current Foley catheter, will arrange for outpatient follow-up to exchange for smaller tube.  2 - Gross Hematuria - this is most certainly from minor catheter trauma in setting of large prostate. Imaging otherwise favorable. CBI stopped and hematuria appears to have resolved.   3- Bladder spasms-  improving  Okay for discharge from GU perspective.   LOS: 2 days    Daniel Dickson 09/01/2016

## 2016-09-01 NOTE — Progress Notes (Signed)
Notified Dr. Cherlynn KaiserSainani of manual BP of 80/40. Pt not symptomatic. Pt had BP to drop yesterday as well and it resolved overnight. No new orders received will continue to monitor. Per Pharmacy B and O suppositpry should not affect BP and should be okay to give.

## 2016-09-02 LAB — CBC
HEMATOCRIT: 39.9 % — AB (ref 40.0–52.0)
Hemoglobin: 13.2 g/dL (ref 13.0–18.0)
MCH: 32.2 pg (ref 26.0–34.0)
MCHC: 33.2 g/dL (ref 32.0–36.0)
MCV: 97.2 fL (ref 80.0–100.0)
Platelets: 167 10*3/uL (ref 150–440)
RBC: 4.1 MIL/uL — AB (ref 4.40–5.90)
RDW: 15.1 % — ABNORMAL HIGH (ref 11.5–14.5)
WBC: 15.1 10*3/uL — AB (ref 3.8–10.6)

## 2016-09-02 LAB — CREATININE, SERUM
Creatinine, Ser: 1.25 mg/dL — ABNORMAL HIGH (ref 0.61–1.24)
GFR calc Af Amer: 52 mL/min — ABNORMAL LOW (ref >60)
GFR calc non Af Amer: 45 mL/min — ABNORMAL LOW (ref >60)

## 2016-09-02 LAB — URINE CULTURE: Culture: >=100000 — AB

## 2016-09-02 NOTE — Care Management Important Message (Signed)
Important Message  Patient Details  Name: Calton GoldsGeorge Henry Mccuen MRN: [id number] Date of Birth: 07/30/1912   Medicare Important Message Given:  Yes    Chapman FitchBOWEN, Taelyn Broecker T, RN 09/02/2016, 11:18 AM

## 2016-09-02 NOTE — Care Management (Signed)
Patient to discharge home today with [facility].  [place] [facility] Notified of discharge.  RNCM signing off.  Will Fax [facility] to [facility] when available.

## 2016-09-02 NOTE — Progress Notes (Addendum)
Patient discharged to home as ordered. Morning medications given prior to discharge. Patient is alert and oriented ambulates with minimal assistance. Patient daughter at the bedside to take patient home. IV discontinued site clean dry and intact. Patient sent home with the foley and daughter states that she has multiple legs bags at home and does not need any more. Daughter verbalizes that she is comfortable with taking care of the Foley. Discharge instructions given to the patient and daughter

## 2016-09-02 NOTE — Discharge Summary (Signed)
[facility] [place] at [facility]   PATIENT NAME: Daniel Dickson    MR#:  [id number]  DATE OF BIRTH:  Aug 26, 1912  DATE OF ADMISSION:  08/29/2016 ADMITTING PHYSICIAN: Arnaldo Natal, MD  DATE OF DISCHARGE: 09/02/2016 12:58 PM  PRIMARY CARE PHYSICIAN: [name], MD    ADMISSION DIAGNOSIS:  Gross hematuria [R31.0]  DISCHARGE DIAGNOSIS:  Active Problems:   Urinary retention   Gross hematuria   SECONDARY DIAGNOSIS:   Past Medical History:  Diagnosis Date  . Anxiety   . COPD (chronic obstructive pulmonary disease) (HCC)   . Coronary artery disease    a. 2012 s/p MI/cardiac arrest--> PCI mRCA.  Marland Kitchen Hypertensive heart disease   . Mixed hyperlipidemia   . PTSD (post-traumatic stress disorder)    with headaches    HOSPITAL COURSE:   83 year old male with past medical history of PTSD, COPD, hypertension, hyperlipidemia, BPH who presented to the hospital due to hematuria and urinary retention.  1. Hematuria/urinary retention-secondary to BPH. -Patient was initially admitted to the hospital started on a continuous bladder irrigation. With continuous bladder irrigation patient's hematuria has improved. His hemoglobin remained stable. urology discontinued his CBI and he had no further hematuria. -He continues to have symptoms of bladder spasms which improved with some Ditropan and Pyridium. He is now being discharged home with his Foley catheter and follow up with urology as an outpatient. He will continue his Flomax and finasteride.  2. Essential hypertension- pt. Had some labile BP and so his BP was held temporarily but he will resume his Imdur upon discharge.   3. Hyperlipidemia - he will cont. Pravachol  4. COPD - he will cont. Spiriva. NO acute exacerbation while in hospital.   5. GERD - he will cont. Protonix.   6. Hyperlipidemia - he will cont. Pravachol.   Patient will resume his home health services including physical therapy, occupational  therapy, speech, social work.  DISCHARGE CONDITIONS:   Stable  CONSULTS OBTAINED:  Treatment Team:  Sebastian Ache, MD  DRUG ALLERGIES:   Allergies  Allergen Reactions  . Cortisone Other (See Comments)    Other reaction(s): Other (See Comments) GI Upset Unknown reaction  . Dye Fdc Red [Red Dye] Other (See Comments)    Unknown reaction.  . Iodine (Kelp)  [Iodine]     Other reaction(s): Other (See Comments) GI Upset  . Peanut Oil     Other reaction(s): Unknown  . Peanut-Containing Drug Products     Other reaction(s): Unknown  . Penicillins Rash and Other (See Comments)    GI Upset patient not sure of reaction Has patient had a PCN reaction causing immediate rash, facial/tongue/throat swelling, SOB or lightheadedness with hypotension: Unknown Has patient had a PCN reaction causing severe rash involving mucus membranes or skin necrosis: Unknown Has patient had a PCN reaction that required hospitalization: Unknown Has patient had a PCN reaction occurring within the last 10 years: Unknown If all of the above answers are "NO", then may proceed with Cephalosporin use.     DISCHARGE MEDICATIONS:   Allergies as of 09/02/2016      Reactions   Cortisone Other (See Comments)   Other reaction(s): Other (See Comments) GI Upset Unknown reaction   Dye Fdc Red [red Dye] Other (See Comments)   Unknown reaction.   Iodine (kelp)  [iodine]    Other reaction(s): Other (See Comments) GI Upset   Peanut Oil    Other reaction(s): Unknown   Peanut-containing Drug Products  Other reaction(s): Unknown   Penicillins Rash, Other (See Comments)   GI Upset patient not sure of reaction Has patient had a PCN reaction causing immediate rash, facial/tongue/throat swelling, SOB or lightheadedness with hypotension: Unknown Has patient had a PCN reaction causing severe rash involving mucus membranes or skin necrosis: Unknown Has patient had a PCN reaction that required hospitalization:  Unknown Has patient had a PCN reaction occurring within the last 10 years: Unknown If all of the above answers are "NO", then may proceed with Cephalosporin use.      Medication List    STOP taking these medications   famotidine 40 MG tablet Commonly known as:  PEPCID   finasteride 5 MG tablet Commonly known as:  PROSCAR   metoprolol succinate 25 MG 24 hr tablet Commonly known as:  TOPROL XL   mupirocin cream 2 % Commonly known as:  BACTROBAN   ondansetron 4 MG tablet Commonly known as:  ZOFRAN     TAKE these medications   acetaminophen 500 MG tablet Commonly known as:  TYLENOL Take 500 mg by mouth daily as needed for mild pain or moderate pain.   antiseptic oral rinse Liqd 15 mLs by Mouth Rinse route as needed for dry mouth.   aspirin EC 81 MG tablet Take 81 mg by mouth daily.   benzonatate 100 MG capsule Commonly known as:  TESSALON PERLES Take 1 capsule (100 mg total) by mouth every 6 (six) hours as needed for cough.   BREO ELLIPTA 100-25 MCG/INH Aepb Generic drug:  fluticasone furoate-vilanterol Inhale 1 puff into the lungs daily. Reported on 10/19/2015   feeding supplement Liqd Take 1 Container by mouth 3 (three) times daily between meals.   isosorbide mononitrate 30 MG 24 hr tablet Commonly known as:  IMDUR Take 1 tablet (30 mg total) by mouth daily.   loratadine 10 MG tablet Commonly known as:  CLARITIN Take 10 mg by mouth daily.   magnesium hydroxide 400 MG/5ML suspension Commonly known as:  MILK OF MAGNESIA Take 30 mLs by mouth 2 (two) times daily as needed for mild constipation or moderate constipation.   Melatonin 5 MG Caps Take 10 mg by mouth at bedtime.   nitroGLYCERIN 0.4 MG SL tablet Commonly known as:  NITROSTAT Place 0.4 mg under the tongue every 5 (five) minutes as needed for chest pain. Reported on 10/19/2015   oxybutynin 5 MG 24 hr tablet Commonly known as:  DITROPAN-XL Take as needed for bladder spasms up to three times a  day Notes to patient:  Last dose given 09/01/16 at 9:45pm   pantoprazole 40 MG tablet Commonly known as:  PROTONIX Take 1 tablet (40 mg total) by mouth daily.   phenazopyridine 100 MG tablet Commonly known as:  PYRIDIUM Take 100 mg by mouth 3 (three) times daily with meals.   pravastatin 80 MG tablet Commonly known as:  PRAVACHOL Take 40 mg by mouth daily.   PROAIR HFA 108 (90 Base) MCG/ACT inhaler Generic drug:  albuterol Inhale 2 puffs into the lungs every 6 (six) hours as needed. For wheezing.   albuterol (2.5 MG/3ML) 0.083% nebulizer solution Commonly known as:  PROVENTIL Take 3 mLs (2.5 mg total) by nebulization every 8 (eight) hours as needed for wheezing or shortness of breath. Notes to patient:  Last dose given 09/02/16 at 1:55am   tamsulosin 0.4 MG Caps capsule Commonly known as:  FLOMAX Take 0.4 mg by mouth daily.   tiotropium 18 MCG inhalation capsule Commonly known as:  SPIRIVA  Place 1 capsule (18 mcg total) into inhaler and inhale every evening.   Vitamin D3 1000 units Caps Take 1,000 Units by mouth daily.         DISCHARGE INSTRUCTIONS:   DIET:  Cardiac diet  DISCHARGE CONDITION:  Stable  ACTIVITY:  Activity as tolerated  OXYGEN:  Home Oxygen: No.   Oxygen Delivery: room air  DISCHARGE LOCATION:  Home with home health physical therapy, occupational therapy, speech, social work   If you experience worsening of your admission symptoms, develop shortness of breath, life threatening emergency, suicidal or homicidal thoughts you must seek medical attention immediately by calling 911 or calling your MD immediately  if symptoms less severe.  You Must read complete instructions/literature along with all the possible adverse reactions/side effects for all the Medicines you take and that have been prescribed to you. Take any new Medicines after you have completely understood and accpet all the possible adverse reactions/side effects.   Please  note  You were cared for by a hospitalist during your hospital stay. If you have any questions about your discharge medications or the care you received while you were in the hospital after you are discharged, you can call the unit and asked to speak with the hospitalist on call if the hospitalist that took care of you is not available. Once you are discharged, your primary care physician will handle any further medical issues. Please note that NO REFILLS for any discharge medications will be authorized once you are discharged, as it is imperative that you return to your primary care physician (or establish a relationship with a primary care physician if you do not have one) for your aftercare needs so that they can reassess your need for medications and monitor your lab values.     Today   Still having some lower abdominal pain with the bladder spasms but improved. No further hematuria. No other acute events overnight.  VITAL SIGNS:  Blood pressure (!) 114/51, pulse 99, temperature 98.4 F (36.9 C), temperature source Oral, resp. rate 20, height 5\' 6"  (1.676 m), weight 56.3 kg (124 lb 3.2 oz), SpO2 91 %.  I/O:   Intake/Output Summary (Last 24 hours) at 09/02/16 1448 Last data filed at 09/02/16 0502  Gross per 24 hour  Intake              120 ml  Output              575 ml  Net             -455 ml    PHYSICAL EXAMINATION:   GENERAL:  83 y.o.-year-old patient lying in bed in no acute distress.  EYES: Pupils equal, round, reactive to light and accommodation. No scleral icterus. Extraocular muscles intact.  HEENT: Head atraumatic, normocephalic. Oropharynx and nasopharynx clear.  NECK:  Supple, no jugular venous distention. No thyroid enlargement, no tenderness.  LUNGS: Normal breath sounds bilaterally, no wheezing, rales, rhonchi. No use of accessory muscles of respiration.  CARDIOVASCULAR: S1, S2 normal. No murmurs, rubs, or gallops.  ABDOMEN: Soft, nontender, nondistended. Bowel  sounds present. No organomegaly or mass.  EXTREMITIES: No cyanosis, clubbing or edema b/l.    NEUROLOGIC: Cranial nerves II through XII are intact. No focal Motor or sensory deficits b/l.  Globally weak.  PSYCHIATRIC: The patient is alert and oriented x 3.  SKIN: No obvious rash, lesion, or ulcer.   Foley cath in place w/ Yellow urine draining.   DATA REVIEW:  CBC  Recent Labs Lab 09/02/16 0427  WBC 15.1*  HGB 13.2  HCT 39.9*  PLT 167    Chemistries   Recent Labs Lab 08/29/16 2119 09/02/16 0427  NA 132*  --   K 4.5  --   CL 99*  --   CO2 26  --   GLUCOSE 91  --   BUN 29*  --   CREATININE 1.07 1.25*  CALCIUM 9.7  --     Cardiac Enzymes No results for input(s): TROPONINI in the last 168 hours.  Microbiology Results  Results for orders placed or performed during the hospital encounter of 08/29/16  Urine culture     Status: Abnormal   Collection Time: 08/29/16  9:19 PM  Result Value Ref Range Status   Specimen Description URINE, RANDOM  Final   Special Requests NONE  Final   Culture (A)  Final    >=100,000 COLONIES/mL PSEUDOMONAS PUTIDA >=100,000 COLONIES/mL ENTEROCOCCUS FAECALIS    Report Status 09/02/2016 FINAL  Final   Organism ID, Bacteria PSEUDOMONAS PUTIDA (A)  Final   Organism ID, Bacteria ENTEROCOCCUS FAECALIS (A)  Final      Susceptibility   Enterococcus faecalis - MIC*    AMPICILLIN <=2 SENSITIVE Sensitive     LEVOFLOXACIN >=8 RESISTANT Resistant     NITROFURANTOIN <=16 SENSITIVE Sensitive     VANCOMYCIN 1 SENSITIVE Sensitive     * >=100,000 COLONIES/mL ENTEROCOCCUS FAECALIS   Pseudomonas putida - MIC*    CEFTAZIDIME 4 SENSITIVE Sensitive     CIPROFLOXACIN <=0.25 SENSITIVE Sensitive     GENTAMICIN <=1 SENSITIVE Sensitive     IMIPENEM 1 SENSITIVE Sensitive     PIP/TAZO 8 SENSITIVE Sensitive     CEFEPIME <=1 SENSITIVE Sensitive     * >=100,000 COLONIES/mL PSEUDOMONAS PUTIDA    RADIOLOGY:  No results found.    Management plans discussed  with the patient, family and they are in agreement.  CODE STATUS:     Code Status Orders        Start     Ordered   08/30/16 0517  Full code  Continuous     08/30/16 0516    Code Status History    Date Active Date Inactive Code Status Order ID Comments User Context   02/18/2016  8:27 AM 02/22/2016  6:50 PM Full Code [id number]  Enedina FinnerPatel, Sona, MD Inpatient   10/29/2015  9:19 PM 11/02/2015  2:22 PM Full Code [id number]  Enid BaasKalisetti, Radhika, MD Inpatient   10/07/2015  7:32 AM 10/09/2015  4:29 PM Full Code [id number]  Ihor AustinPyreddy, Pavan, MD Inpatient   06/07/2015 12:04 AM 06/10/2015  4:31 PM Full Code [id number]  Oralia ManisWillis, David, MD Inpatient   01/11/2015  1:54 PM 01/12/2015  1:58 PM Full Code [id number]  Alford HighlandWieting, Richard, MD ED      TOTAL TIME TAKING CARE OF THIS PATIENT: 40 minutes.    Houston SirenSAINANI,Careli Luzader J M.D on 09/02/2016 at 2:48 PM  Between 7am to 6pm - Pager - [phone]  After 6pm go to [email or web address] - Social research officer, governmentpassword [email or web address]  [facility]  Office  [phone]  CC: Primary care physician; Dortha KernBliss, Laura K, MD

## 2016-09-07 ENCOUNTER — Ambulatory Visit: Payer: Medicare PPO

## 2016-09-07 NOTE — Care Management (Signed)
Post discharge note: Discharge summary faxed to [facility].  

## 2016-09-07 NOTE — Progress Notes (Signed)
Cath Change/ Replacement  Patient is present today for a catheter change due to urinary retention.  30ml of water was removed from the balloon, a 22FR foley cath was removed with out difficulty.  Patient was cleaned and prepped in a sterile fashion with betadine and 2% lidocaine jelly was instilled into the urethra. A 16 FR foley cath was replaced into the bladder no complications were noted Urine return was noted 10ml and urine was yellow in color. The balloon was filled with 10ml of sterile water. A  night bag was attached for drainage. Patient was given proper instruction on catheter care.    Preformed by: C.Rana SnareLowe, CMA & Eligha BridegroomSarah Watts, CMA

## 2016-09-08 ENCOUNTER — Telehealth: Payer: Self-pay

## 2016-09-08 ENCOUNTER — Emergency Department: Payer: Medicare PPO

## 2016-09-08 ENCOUNTER — Emergency Department
Admission: EM | Admit: 2016-09-08 | Discharge: 2016-09-08 | Disposition: A | Discharge status: Home / Self Care | Payer: Medicare PPO | Attending: Emergency Medicine | Admitting: Emergency Medicine

## 2016-09-08 ENCOUNTER — Encounter: Payer: Self-pay | Admitting: *Deleted

## 2016-09-08 DIAGNOSIS — J449 Chronic obstructive pulmonary disease, unspecified: Secondary | ICD-10-CM | POA: Insufficient documentation

## 2016-09-08 DIAGNOSIS — I2510 Atherosclerotic heart disease of native coronary artery without angina pectoris: Secondary | ICD-10-CM | POA: Insufficient documentation

## 2016-09-08 DIAGNOSIS — Z87891 Personal history of nicotine dependence: Secondary | ICD-10-CM | POA: Insufficient documentation

## 2016-09-08 DIAGNOSIS — Z7982 Long term (current) use of aspirin: Secondary | ICD-10-CM | POA: Insufficient documentation

## 2016-09-08 DIAGNOSIS — Z79899 Other long term (current) drug therapy: Secondary | ICD-10-CM | POA: Diagnosis not present

## 2016-09-08 DIAGNOSIS — Z960 Presence of urogenital implants: Secondary | ICD-10-CM | POA: Diagnosis not present

## 2016-09-08 DIAGNOSIS — K6289 Other specified diseases of anus and rectum: Secondary | ICD-10-CM

## 2016-09-08 DIAGNOSIS — Z978 Presence of other specified devices: Secondary | ICD-10-CM

## 2016-09-08 DIAGNOSIS — K5900 Constipation, unspecified: Secondary | ICD-10-CM | POA: Insufficient documentation

## 2016-09-08 LAB — URINALYSIS, COMPLETE (UACMP) WITH MICROSCOPIC
Bilirubin Urine: NEGATIVE
Glucose, UA: NEGATIVE mg/dL
Ketones, ur: NEGATIVE mg/dL
Nitrite: NEGATIVE
PROTEIN: 100 mg/dL — AB
SQUAMOUS EPITHELIAL / LPF: NONE SEEN
Specific Gravity, Urine: 1.02 (ref 1.005–1.030)
pH: 7 (ref 5.0–8.0)

## 2016-09-08 LAB — CBC WITH DIFFERENTIAL/PLATELET
Basophils Absolute: 0.1 10*3/uL (ref 0–0.1)
Basophils Relative: 1 %
Eosinophils Absolute: 0.2 10*3/uL (ref 0–0.7)
Eosinophils Relative: 3 %
HEMATOCRIT: 35 % — AB (ref 40.0–52.0)
Hemoglobin: 11.9 g/dL — ABNORMAL LOW (ref 13.0–18.0)
LYMPHS PCT: 13 %
Lymphs Abs: 1.2 10*3/uL (ref 1.0–3.6)
MCH: 32.8 pg (ref 26.0–34.0)
MCHC: 34.1 g/dL (ref 32.0–36.0)
MCV: 96.2 fL (ref 80.0–100.0)
MONO ABS: 0.7 10*3/uL (ref 0.2–1.0)
MONOS PCT: 8 %
NEUTROS ABS: 6.9 10*3/uL — AB (ref 1.4–6.5)
Neutrophils Relative %: 75 %
Platelets: 252 10*3/uL (ref 150–440)
RBC: 3.64 MIL/uL — ABNORMAL LOW (ref 4.40–5.90)
RDW: 14.7 % — AB (ref 11.5–14.5)
WBC: 9 10*3/uL (ref 3.8–10.6)

## 2016-09-08 LAB — COMPREHENSIVE METABOLIC PANEL
ALBUMIN: 3.1 g/dL — AB (ref 3.5–5.0)
ALK PHOS: 53 U/L (ref 38–126)
ALT: 43 U/L (ref 17–63)
ANION GAP: 7 (ref 5–15)
AST: 43 U/L — AB (ref 15–41)
BUN: 33 mg/dL — ABNORMAL HIGH (ref 6–20)
CALCIUM: 9.1 mg/dL (ref 8.9–10.3)
CO2: 26 mmol/L (ref 22–32)
Chloride: 97 mmol/L — ABNORMAL LOW (ref 101–111)
Creatinine, Ser: 1.05 mg/dL (ref 0.61–1.24)
GFR calc Af Amer: >60 mL/min (ref >60)
GFR calc non Af Amer: 55 mL/min — ABNORMAL LOW (ref >60)
GLUCOSE: 113 mg/dL — AB (ref 65–99)
POTASSIUM: 4.9 mmol/L (ref 3.5–5.1)
SODIUM: 130 mmol/L — AB (ref 135–145)
Total Bilirubin: 0.7 mg/dL (ref 0.3–1.2)
Total Protein: 6.1 g/dL — ABNORMAL LOW (ref 6.5–8.1)

## 2016-09-08 MED ORDER — SENNOSIDES-DOCUSATE SODIUM 8.6-50 MG PO TABS: 2.0000 | ORAL_TABLET | Freq: Two times a day (BID) | ORAL | 0 refills | Status: DC

## 2016-09-08 MED ORDER — SODIUM CHLORIDE 0.9 % IV BOLUS (SEPSIS)
1000.0000 mL | Freq: Once | INTRAVENOUS | Status: AC
  Administered 2016-09-08: 1000 mL via INTRAVENOUS

## 2016-09-08 MED ORDER — CEPHALEXIN 500 MG PO CAPS: 500.0000 mg | ORAL_CAPSULE | Freq: Two times a day (BID) | ORAL | 0 refills | Status: DC

## 2016-09-08 MED ORDER — MAGNESIUM CITRATE PO SOLN
ORAL | Status: AC
  Filled 2016-09-08: qty 296

## 2016-09-08 NOTE — ED Notes (Signed)
Pt had BM on bedpan, EDP aware

## 2016-09-08 NOTE — ED Notes (Signed)
EDP at bedside  

## 2016-09-08 NOTE — ED Provider Notes (Signed)
[facility] Emergency Department Provider Note  ____________________________________________  Time seen: Approximately 5:16 PM  I have reviewed the triage vital signs and the nursing notes.   HISTORY  Chief Complaint Rectal Pain    HPI Daniel Dickson is a 83 y.o. male who complains of rectal pain for the past 3 days. This is in the setting of constipation and not having a bowel movement in 3 days. He reports that when he eats, he gets more rectal pain and pressure. Reports that when his wife was alive should give him an enema for this and then he would feel better. Denies vomiting. No fever or chills. Pain is intermittent, sharp, lasts only a few seconds. No alleviating factors. Moderate intensity when present.  Also reports that his chronic indwelling Foley catheter replaced by his urologist yesterday. Complains of some penile pain. No change in output.     Past Medical History:  Diagnosis Date  . Anxiety   . COPD (chronic obstructive pulmonary disease) (HCC)   . Coronary artery disease    a. 2012 s/p MI/cardiac arrest--> PCI mRCA.  Marland Kitchen Hypertensive heart disease   . Mixed hyperlipidemia   . PTSD (post-traumatic stress disorder)    with headaches     Patient Active Problem List   Diagnosis Date Noted  . Gross hematuria   . Acute respiratory failure (HCC) 02/18/2016  . Urinary retention 11/10/2015  . Hyperkalemia 11/02/2015  . Hypotension 11/02/2015  . Dysphagia, pharyngoesophageal phase 11/02/2015  . Esophageal spasm 11/02/2015  . Constipation 11/02/2015  . SIADH (syndrome of inappropriate ADH production) (HCC) 11/02/2015  . Urinary retention due to benign prostatic hyperplasia 11/02/2015  . Chronic indwelling Foley catheter 11/02/2015  . Bacteriuria with pyuria 11/02/2015  . Protein-calorie malnutrition, severe 10/30/2015  . ARF (acute renal failure) (HCC) 10/29/2015  . Dyspnea 10/07/2015  . Hyponatremia 06/06/2015  . Coronary artery  disease   . Hypertensive heart disease   . Mixed hyperlipidemia   . COPD (chronic obstructive pulmonary disease) (HCC)   . COPD exacerbation (HCC) 01/11/2015  . Moderate COPD (chronic obstructive pulmonary disease) (HCC) 12/08/2014  . Fall 09/03/2014  . Hip fracture (HCC) 09/03/2014  . PTSD (post-traumatic stress disorder) 09/03/2014  . Bilateral leg edema 09/03/2014  . SOB (shortness of breath) 10/31/2012  . Essential hypertension 10/31/2012  . CAD (coronary artery disease) 10/31/2012  . Hyperlipidemia 10/31/2012  . Tachycardia 10/31/2012     Past Surgical History:  Procedure Laterality Date  . CATARACT EXTRACTION    . CHOLECYSTECTOMY    . CORONARY ANGIOPLASTY     with stent; [facility]  . HAND SURGERY    . HEMORRHOID SURGERY    . HIP SURGERY    . VASCULAR SURGERY     stent R femoral artery     Prior to Admission medications   Medication Sig Start Date End Date Taking? Authorizing Provider  acetaminophen (TYLENOL) 500 MG tablet Take 500 mg by mouth daily as needed for mild pain or moderate pain.    Yes [provider]  albuterol (PROAIR HFA) 108 (90 BASE) MCG/ACT inhaler Inhale 2 puffs into the lungs every 6 (six) hours as needed. For wheezing.   Yes [provider]  albuterol (PROVENTIL) (2.5 MG/3ML) 0.083% nebulizer solution Take 3 mLs (2.5 mg total) by nebulization every 8 (eight) hours as needed for wheezing or shortness of breath. 10/09/15  Yes Mody, Patricia Pesa, MD  antiseptic oral rinse (BIOTENE) LIQD 15 mLs by Mouth Rinse route as needed for  dry mouth.   Yes [provider]  aspirin EC 81 MG tablet Take 81 mg by mouth daily.   Yes [provider]  Cholecalciferol (VITAMIN D3) 1000 UNITS CAPS Take 1,000 Units by mouth daily.   Yes [provider]  feeding supplement (BOOST / RESOURCE BREEZE) LIQD Take 1 Container by mouth 3 (three) times daily between meals. 11/02/15  Yes Katharina Caper, MD  isosorbide mononitrate (IMDUR) 30 MG 24 hr  tablet Take 1 tablet (30 mg total) by mouth daily. 11/02/15  Yes Katharina Caper, MD  loratadine (CLARITIN) 10 MG tablet Take 10 mg by mouth daily.   Yes [provider]  magnesium hydroxide (MILK OF MAGNESIA) 400 MG/5ML suspension Take 30 mLs by mouth 2 (two) times daily as needed for mild constipation or moderate constipation. 11/02/15  Yes Katharina Caper, MD  Melatonin 5 MG CAPS Take 10 mg by mouth at bedtime.   Yes [provider]  nitroGLYCERIN (NITROSTAT) 0.4 MG SL tablet Place 0.4 mg under the tongue every 5 (five) minutes as needed for chest pain. Reported on 10/19/2015   Yes [provider]  oxybutynin (DITROPAN-XL) 5 MG 24 hr tablet Take as needed for bladder spasms up to three times a day 07/27/16  Yes McGowan, Carollee Herter A, PA-C  pantoprazole (PROTONIX) 40 MG tablet Take 1 tablet (40 mg total) by mouth daily. 11/02/15  Yes Katharina Caper, MD  phenazopyridine (PYRIDIUM) 100 MG tablet Take 100 mg by mouth 3 (three) times daily with meals.   Yes [provider]  pravastatin (PRAVACHOL) 80 MG tablet Take 40 mg by mouth daily.   Yes [provider]  tamsulosin (FLOMAX) 0.4 MG CAPS capsule Take 0.4 mg by mouth daily.    Yes [provider]  tiotropium (SPIRIVA) 18 MCG inhalation capsule Place 1 capsule (18 mcg total) into inhaler and inhale every evening. 10/09/15 10/08/16 Yes Mody, Patricia Pesa, MD  benzonatate (TESSALON PERLES) 100 MG capsule Take 1 capsule (100 mg total) by mouth every 6 (six) hours as needed for cough. Patient not taking: Reported on 09/08/2016 04/06/16 04/06/17  Phineas Semen, MD  cephALEXin (KEFLEX) 500 MG capsule Take 1 capsule (500 mg total) by mouth 2 (two) times daily. 09/08/16   Sharman Cheek, MD  senna-docusate (SENOKOT-S) 8.6-50 MG tablet Take 2 tablets by mouth 2 (two) times daily. 09/08/16   Sharman Cheek, MD     Allergies Cortisone; Dye fdc red [red dye]; Iodine (kelp)  [iodine]; Peanut oil; Peanut-containing drug  products; and Penicillins   Family History  Problem Relation Age of Onset  . COPD Father   . Bladder Cancer Neg Hx   . Kidney cancer Neg Hx   . Prostate cancer Neg Hx     Social History Social History  Substance Use Topics  . Smoking status: Former Smoker    Packs/day: 3.00    Years: 40.00    Types: Cigarettes  . Smokeless tobacco: Never Used     Comment: quit 62 years  . Alcohol use Yes     Comment: wine at night.     Review of Systems  Constitutional:   No fever or chills.  ENT:   No sore throat. No rhinorrhea. Cardiovascular:   No chest pain or syncope. Respiratory:   No dyspnea or cough. Gastrointestinal:   Negative for abdominal pain, vomiting and diarrhea. Positive rectal pain as above. Positive constipation Musculoskeletal:   Negative for focal pain or swelling All other systems reviewed and are negative except as  documented above in ROS and HPI.  ____________________________________________   PHYSICAL EXAM:  VITAL SIGNS: ED Triage Vitals  Enc Vitals Group     BP 09/08/16 1549 (!) 136/91     Pulse Rate 09/08/16 1549 88     Resp 09/08/16 1549 16     Temp 09/08/16 1549 98.6 F (37 C)     Temp Source 09/08/16 1549 Oral     SpO2 09/08/16 1549 98 %     Weight 09/08/16 1550 124 lb (56.2 kg)     Height 09/08/16 1550 5\' 6"  (1.676 m)     Head Circumference --      Peak Flow --      Pain Score 09/08/16 1549 10     Pain Loc --      Pain Edu? --      Excl. in GC? --     Vital signs reviewed, nursing assessments reviewed.   Constitutional:   Alert and oriented. Well appearing and in no distress. Eyes:   No scleral icterus. No conjunctival pallor. PERRL. EOMI.  No nystagmus. ENT   Head:   Normocephalic and atraumatic.   Nose:   No congestion/rhinnorhea.    Mouth/Throat:   MMM, no pharyngeal erythema. No peritonsillar mass.    Neck:   No meningismus. Full ROM Hematological/Lymphatic/Immunilogical:   No cervical lymphadenopathy. Cardiovascular:    RRR. Symmetric bilateral radial and DP pulses.  No murmurs.  Respiratory:   Normal respiratory effort without tachypnea/retractions. Breath sounds are clear and equal bilaterally. No wheezes/rales/rhonchi. Gastrointestinal:   Soft and nontender. Non distended. There is no CVA tenderness.  No rebound, rigidity, or guarding. Genitourinary:   Unremarkable. Foley catheter in place. Clean tubing Musculoskeletal:   Normal range of motion in all extremities. No joint effusions.  No lower extremity tenderness.  No edema. Neurologic:   Normal speech and language.  Motor grossly intact. No gross focal neurologic deficits are appreciated.  Skin:    Skin is warm, dry and intact. No rash noted.  No petechiae, purpura, or bullae.  ____________________________________________    LABS (pertinent positives/negatives) (all labs ordered are listed, but only abnormal results are displayed) Labs Reviewed  COMPREHENSIVE METABOLIC PANEL - Abnormal; Notable for the following:       Result Value   Sodium 130 (*)    Chloride 97 (*)    Glucose, Bld 113 (*)    BUN 33 (*)    Total Protein 6.1 (*)    Albumin 3.1 (*)    AST 43 (*)    GFR calc non Af Amer 55 (*)    All other components within normal limits  CBC WITH DIFFERENTIAL/PLATELET - Abnormal; Notable for the following:    RBC 3.64 (*)    Hemoglobin 11.9 (*)    HCT 35.0 (*)    RDW 14.7 (*)    Neutro Abs 6.9 (*)    All other components within normal limits  URINALYSIS, COMPLETE (UACMP) WITH MICROSCOPIC - Abnormal; Notable for the following:    Color, Urine YELLOW (*)    APPearance CLOUDY (*)    Hgb urine dipstick SMALL (*)    Protein, ur 100 (*)    Leukocytes, UA LARGE (*)    Bacteria, UA RARE (*)    All other components within normal limits  URINE CULTURE   ____________________________________________   EKG    ____________________________________________    RADIOLOGY  Dg Abdomen Acute W/chest  Result Date: 09/08/2016 CLINICAL DATA:   83 year old with acute onset of  rectal pain that began earlier today. Last bowel movements 3 days ago. Indwelling urinary catheter. EXAM: DG ABDOMEN ACUTE W/ 1V CHEST COMPARISON:  CT abdomen pelvis 08/29/2016. CT pelvis 10/24/2015. CTA chest 04/06/2016. Chest x-rays 04/06/2016 and earlier. FINDINGS: Nonobstructive bowel gas pattern. Gas in multiple normal caliber loops of small bowel. Mild gaseous distention of the hepatic flexure of the colon, unchanged since the prior CTs. Large stool burden in the rectum, though expected stool burden throughout the remainder of the colon. No evidence of free intraperitoneal air on the erect image. No air-fluid levels. Aortoiliofemoral atherosclerosis. Severe osseous demineralization. Cardiac silhouette normal in size, unchanged. Suboptimal inspiration accounts for mild atelectasis in the lung bases. Lungs otherwise clear. Pulmonary vascularity normal. IMPRESSION: 1. No acute abdominal abnormality apart from a large rectal stool burden. 2. Suboptimal inspiration accounts for mild bibasilar atelectasis. No acute cardiopulmonary disease otherwise. Electronically Signed   By: Hulan Saashomas  Lawrence M.D.   On: 09/08/2016 16:31    ____________________________________________   PROCEDURES Procedures  ____________________________________________   INITIAL IMPRESSION / ASSESSMENT AND PLAN / ED COURSE  Pertinent labs & imaging results that were available during my care of the patient were reviewed by me and considered in my medical decision making (see chart for details).  Patient presents with rectal pain and symptoms of constipation. Workup is unremarkable. X-rays consistent with constipation. Patient had a bowel movement in the ED and feels much better. We'll discharge home on Senokot. Also complains of some penile pain after his Foley catheter changed yesterday. Urinalysis does show lots of leukocyte esterase and Hitz blood cells. Urine culture sent. i'll start the  patient in Keflex for now. No evidence of pyelonephritis or sepsis. Low suspicion of bowel perforation or obstruction. I highly doubt a vascular issue like AAA or dissection or mesenteric ischemia.      ____________________________________________   FINAL CLINICAL IMPRESSION(S) / ED DIAGNOSES  Final diagnoses:  Chronic indwelling Foley catheter  Constipation, unspecified constipation type  Rectal pain      New Prescriptions   CEPHALEXIN (KEFLEX) 500 MG CAPSULE    Take 1 capsule (500 mg total) by mouth 2 (two) times daily.   SENNA-DOCUSATE (SENOKOT-S) 8.6-50 MG TABLET    Take 2 tablets by mouth 2 (two) times daily.     Portions of this note were generated with dragon dictation software. Dictation errors may occur despite best attempts at proofreading.    Sharman CheekStafford, Dulcemaria Bula, MD 09/08/16 [phone]

## 2016-09-08 NOTE — Discharge Instructions (Signed)
Take keflex for urinary tract infection. Follow up with your primary care doctor within 1 week.

## 2016-09-08 NOTE — Telephone Encounter (Signed)
[facility] stating she will be working with pt once a week for the next 2 months. Megan wanted to know if she should/could change pt catheter while working with him. Please advise. Megan's number: [phone].

## 2016-09-08 NOTE — ED Notes (Signed)
Pt resting in bed, awake and alert in no acute distress 

## 2016-09-08 NOTE — ED Triage Notes (Signed)
Pt arrives via EMS from home, pt states rectal paint hat began this AM, states last BM was 3 days ago, also states penis pain, arrives with catheter in place that was changed yesterday, no drainage around cath site noticed, pt awake and alert

## 2016-09-08 NOTE — Telephone Encounter (Signed)
That would be fine [facility]

## 2016-09-09 NOTE — Telephone Encounter (Signed)
Left a message for Daniel MilletMegan stating ok to change cath at next visit

## 2016-09-11 LAB — URINE CULTURE

## 2016-09-12 NOTE — Progress Notes (Signed)
ED urine culture from 09/08/16 results with 30 K enterococcus faecalis. [facility] Cassandra who relayed the information to Dr. Apolinar JunesBrandon. They did not wish for me to pursue antimicrobial therapy through the ED providers at this time; they will manage the patient with catheter changes.   Keven Soucy A. Carsonookson, VermontPharm.D., BCPS Clinical Pharmacist 09/12/2016 14:40

## 2016-09-24 ENCOUNTER — Observation Stay
Admission: EM | Admit: 2016-09-24 | Discharge: 2016-09-28 | Disposition: A | Discharge status: Home / Self Care | Payer: Medicare PPO | Attending: Internal Medicine | Admitting: Internal Medicine

## 2016-09-24 ENCOUNTER — Emergency Department: Payer: Medicare PPO

## 2016-09-24 DIAGNOSIS — Z87891 Personal history of nicotine dependence: Secondary | ICD-10-CM | POA: Diagnosis not present

## 2016-09-24 DIAGNOSIS — Z9102 Food additives allergy status: Secondary | ICD-10-CM | POA: Diagnosis not present

## 2016-09-24 DIAGNOSIS — Z836 Family history of other diseases of the respiratory system: Secondary | ICD-10-CM | POA: Insufficient documentation

## 2016-09-24 DIAGNOSIS — F4310 Post-traumatic stress disorder, unspecified: Secondary | ICD-10-CM | POA: Diagnosis not present

## 2016-09-24 DIAGNOSIS — J441 Chronic obstructive pulmonary disease with (acute) exacerbation: Principal | ICD-10-CM | POA: Insufficient documentation

## 2016-09-24 DIAGNOSIS — R1310 Dysphagia, unspecified: Secondary | ICD-10-CM | POA: Diagnosis not present

## 2016-09-24 DIAGNOSIS — E782 Mixed hyperlipidemia: Secondary | ICD-10-CM | POA: Diagnosis not present

## 2016-09-24 DIAGNOSIS — Z7982 Long term (current) use of aspirin: Secondary | ICD-10-CM | POA: Insufficient documentation

## 2016-09-24 DIAGNOSIS — I700 Atherosclerosis of aorta: Secondary | ICD-10-CM | POA: Insufficient documentation

## 2016-09-24 DIAGNOSIS — N401 Enlarged prostate with lower urinary tract symptoms: Secondary | ICD-10-CM | POA: Insufficient documentation

## 2016-09-24 DIAGNOSIS — I2510 Atherosclerotic heart disease of native coronary artery without angina pectoris: Secondary | ICD-10-CM | POA: Diagnosis not present

## 2016-09-24 DIAGNOSIS — Z7951 Long term (current) use of inhaled steroids: Secondary | ICD-10-CM | POA: Diagnosis not present

## 2016-09-24 DIAGNOSIS — Z888 Allergy status to other drugs, medicaments and biological substances status: Secondary | ICD-10-CM | POA: Insufficient documentation

## 2016-09-24 DIAGNOSIS — Z91010 Allergy to peanuts: Secondary | ICD-10-CM | POA: Diagnosis not present

## 2016-09-24 DIAGNOSIS — R338 Other retention of urine: Secondary | ICD-10-CM | POA: Diagnosis not present

## 2016-09-24 DIAGNOSIS — Z8674 Personal history of sudden cardiac arrest: Secondary | ICD-10-CM | POA: Insufficient documentation

## 2016-09-24 DIAGNOSIS — Z66 Do not resuscitate: Secondary | ICD-10-CM | POA: Diagnosis not present

## 2016-09-24 DIAGNOSIS — Z880 Allergy status to penicillin: Secondary | ICD-10-CM | POA: Diagnosis not present

## 2016-09-24 DIAGNOSIS — I119 Hypertensive heart disease without heart failure: Secondary | ICD-10-CM | POA: Insufficient documentation

## 2016-09-24 DIAGNOSIS — Z79899 Other long term (current) drug therapy: Secondary | ICD-10-CM | POA: Insufficient documentation

## 2016-09-24 DIAGNOSIS — R627 Adult failure to thrive: Secondary | ICD-10-CM

## 2016-09-24 DIAGNOSIS — R0902 Hypoxemia: Secondary | ICD-10-CM | POA: Insufficient documentation

## 2016-09-24 DIAGNOSIS — Z7189 Other specified counseling: Secondary | ICD-10-CM

## 2016-09-24 DIAGNOSIS — Z515 Encounter for palliative care: Secondary | ICD-10-CM

## 2016-09-24 DIAGNOSIS — I252 Old myocardial infarction: Secondary | ICD-10-CM | POA: Insufficient documentation

## 2016-09-24 DIAGNOSIS — J449 Chronic obstructive pulmonary disease, unspecified: Secondary | ICD-10-CM | POA: Diagnosis present

## 2016-09-24 LAB — BLOOD GAS, VENOUS
Acid-Base Excess: 1.9 mmol/L (ref 0.0–2.0)
BICARBONATE: 26.6 mmol/L (ref 20.0–28.0)
O2 Saturation: 83.5 %
PATIENT TEMPERATURE: 37
PO2 VEN: 47 mmHg — AB (ref 32.0–45.0)
pCO2, Ven: 41 mmHg — ABNORMAL LOW (ref 44.0–60.0)
pH, Ven: 7.42 (ref 7.250–7.430)

## 2016-09-24 LAB — CBC WITH DIFFERENTIAL/PLATELET
BASOS ABS: 0.1 10*3/uL (ref 0–0.1)
BASOS PCT: 1 %
EOS ABS: 0.8 10*3/uL — AB (ref 0–0.7)
Eosinophils Relative: 12 %
HCT: 40.1 % (ref 40.0–52.0)
HEMOGLOBIN: 13.8 g/dL (ref 13.0–18.0)
Lymphocytes Relative: 27 %
Lymphs Abs: 1.9 10*3/uL (ref 1.0–3.6)
MCH: 33.1 pg (ref 26.0–34.0)
MCHC: 34.4 g/dL (ref 32.0–36.0)
MCV: 96 fL (ref 80.0–100.0)
MONOS PCT: 8 %
Monocytes Absolute: 0.6 10*3/uL (ref 0.2–1.0)
NEUTROS PCT: 52 %
Neutro Abs: 3.7 10*3/uL (ref 1.4–6.5)
Platelets: 216 10*3/uL (ref 150–440)
RBC: 4.17 MIL/uL — AB (ref 4.40–5.90)
RDW: 14.7 % — ABNORMAL HIGH (ref 11.5–14.5)
WBC: 7.1 10*3/uL (ref 3.8–10.6)

## 2016-09-24 LAB — COMPREHENSIVE METABOLIC PANEL
ALBUMIN: 3.6 g/dL (ref 3.5–5.0)
ALK PHOS: 44 U/L (ref 38–126)
ALT: 17 U/L (ref 17–63)
ANION GAP: 7 (ref 5–15)
AST: 26 U/L (ref 15–41)
BUN: 24 mg/dL — ABNORMAL HIGH (ref 6–20)
CALCIUM: 9.4 mg/dL (ref 8.9–10.3)
CO2: 26 mmol/L (ref 22–32)
Chloride: 100 mmol/L — ABNORMAL LOW (ref 101–111)
Creatinine, Ser: 1.14 mg/dL (ref 0.61–1.24)
GFR calc Af Amer: 58 mL/min — ABNORMAL LOW (ref >60)
GFR calc non Af Amer: 50 mL/min — ABNORMAL LOW (ref >60)
GLUCOSE: 105 mg/dL — AB (ref 65–99)
Potassium: 4.5 mmol/L (ref 3.5–5.1)
SODIUM: 133 mmol/L — AB (ref 135–145)
Total Bilirubin: 0.6 mg/dL (ref 0.3–1.2)
Total Protein: 6.6 g/dL (ref 6.5–8.1)

## 2016-09-24 LAB — TROPONIN I: Troponin I: <0.03 ng/mL (ref <0.03)

## 2016-09-24 MED ORDER — MAGNESIUM HYDROXIDE 400 MG/5ML PO SUSP
30.0000 mL | Freq: Two times a day (BID) | ORAL | Status: DC | PRN
  Administered 2016-09-27: 10:00:00 30 mL via ORAL
  Filled 2016-09-24: qty 30

## 2016-09-24 MED ORDER — ASPIRIN EC 81 MG PO TBEC
81.0000 mg | DELAYED_RELEASE_TABLET | Freq: Every day | ORAL | Status: DC
  Administered 2016-09-24 – 2016-09-28 (×5): 81 mg via ORAL
  Filled 2016-09-24 (×6): qty 1

## 2016-09-24 MED ORDER — DEXTROSE 5 % IV SOLN
500.0000 mg | Freq: Once | INTRAVENOUS | Status: AC
  Administered 2016-09-24: 500 mg via INTRAVENOUS
  Filled 2016-09-24: qty 500

## 2016-09-24 MED ORDER — NITROGLYCERIN 0.4 MG SL SUBL: 0.4000 mg | SUBLINGUAL_TABLET | SUBLINGUAL | Status: DC | PRN

## 2016-09-24 MED ORDER — ENOXAPARIN SODIUM 30 MG/0.3ML ~~LOC~~ SOLN
30.0000 mg | SUBCUTANEOUS | Status: DC
  Administered 2016-09-24 – 2016-09-27 (×4): 30 mg via SUBCUTANEOUS
  Filled 2016-09-24 (×4): qty 0.3

## 2016-09-24 MED ORDER — ACETAMINOPHEN 650 MG RE SUPP: 650.0000 mg | Freq: Four times a day (QID) | RECTAL | Status: DC | PRN

## 2016-09-24 MED ORDER — LEVOFLOXACIN 500 MG PO TABS
250.0000 mg | ORAL_TABLET | Freq: Every day | ORAL | Status: DC
  Administered 2016-09-25 – 2016-09-27 (×3): 250 mg via ORAL
  Filled 2016-09-24 (×3): qty 1

## 2016-09-24 MED ORDER — IPRATROPIUM-ALBUTEROL 0.5-2.5 (3) MG/3ML IN SOLN
6.0000 mL | Freq: Once | RESPIRATORY_TRACT | Status: AC
  Administered 2016-09-24: 6 mL via RESPIRATORY_TRACT

## 2016-09-24 MED ORDER — ALBUTEROL SULFATE (2.5 MG/3ML) 0.083% IN NEBU
2.5000 mg | INHALATION_SOLUTION | Freq: Once | RESPIRATORY_TRACT | Status: AC
  Administered 2016-09-24: 2.5 mg via RESPIRATORY_TRACT
  Filled 2016-09-24: qty 3

## 2016-09-24 MED ORDER — ALBUTEROL SULFATE (2.5 MG/3ML) 0.083% IN NEBU
5.0000 mg | INHALATION_SOLUTION | Freq: Once | RESPIRATORY_TRACT | Status: DC
  Filled 2016-09-24: qty 6

## 2016-09-24 MED ORDER — IPRATROPIUM-ALBUTEROL 0.5-2.5 (3) MG/3ML IN SOLN
RESPIRATORY_TRACT | Status: AC
  Administered 2016-09-24: 6 mL via RESPIRATORY_TRACT
  Filled 2016-09-24: qty 6

## 2016-09-24 MED ORDER — BENZONATATE 100 MG PO CAPS
100.0000 mg | ORAL_CAPSULE | Freq: Three times a day (TID) | ORAL | Status: DC
  Administered 2016-09-24 – 2016-09-28 (×13): 100 mg via ORAL
  Filled 2016-09-24 (×13): qty 1

## 2016-09-24 MED ORDER — TAMSULOSIN HCL 0.4 MG PO CAPS
0.4000 mg | ORAL_CAPSULE | Freq: Every day | ORAL | Status: DC
Start: 2016-09-24 — End: 2016-09-28
  Administered 2016-09-24 – 2016-09-28 (×5): 0.4 mg via ORAL
  Filled 2016-09-24 (×5): qty 1

## 2016-09-24 MED ORDER — ISOSORBIDE MONONITRATE ER 30 MG PO TB24
30.0000 mg | ORAL_TABLET | Freq: Every day | ORAL | Status: DC
  Administered 2016-09-24 – 2016-09-28 (×5): 30 mg via ORAL
  Filled 2016-09-24 (×5): qty 1

## 2016-09-24 MED ORDER — MAGNESIUM SULFATE 2 GM/50ML IV SOLN
2.0000 g | Freq: Once | INTRAVENOUS | Status: AC
  Administered 2016-09-24: 2 g via INTRAVENOUS
  Filled 2016-09-24: qty 50

## 2016-09-24 MED ORDER — BOOST / RESOURCE BREEZE PO LIQD
1.0000 | Freq: Three times a day (TID) | ORAL | Status: DC
  Administered 2016-09-24 – 2016-09-25 (×4): 1 via ORAL

## 2016-09-24 MED ORDER — OXYBUTYNIN CHLORIDE 5 MG PO TABS
5.0000 mg | ORAL_TABLET | Freq: Three times a day (TID) | ORAL | Status: DC | PRN
  Filled 2016-09-24: qty 1

## 2016-09-24 MED ORDER — SENNOSIDES-DOCUSATE SODIUM 8.6-50 MG PO TABS
2.0000 | ORAL_TABLET | Freq: Two times a day (BID) | ORAL | Status: DC
  Administered 2016-09-24 – 2016-09-28 (×9): 2 via ORAL
  Filled 2016-09-24 (×9): qty 2

## 2016-09-24 MED ORDER — PANTOPRAZOLE SODIUM 40 MG PO TBEC
40.0000 mg | DELAYED_RELEASE_TABLET | Freq: Every day | ORAL | Status: DC
  Administered 2016-09-24 – 2016-09-28 (×5): 40 mg via ORAL
  Filled 2016-09-24 (×5): qty 1

## 2016-09-24 MED ORDER — LORATADINE 10 MG PO TABS
10.0000 mg | ORAL_TABLET | Freq: Every day | ORAL | Status: DC
  Administered 2016-09-24 – 2016-09-28 (×5): 10 mg via ORAL
  Filled 2016-09-24 (×5): qty 1

## 2016-09-24 MED ORDER — ONDANSETRON HCL 4 MG/2ML IJ SOLN: 4.0000 mg | Freq: Four times a day (QID) | INTRAMUSCULAR | Status: DC | PRN

## 2016-09-24 MED ORDER — PRAVASTATIN SODIUM 40 MG PO TABS
40.0000 mg | ORAL_TABLET | Freq: Every day | ORAL | Status: DC
  Administered 2016-09-24 – 2016-09-27 (×4): 40 mg via ORAL
  Filled 2016-09-24 (×4): qty 1

## 2016-09-24 MED ORDER — ONDANSETRON HCL 4 MG PO TABS: 4.0000 mg | ORAL_TABLET | Freq: Four times a day (QID) | ORAL | Status: DC | PRN

## 2016-09-24 MED ORDER — HYDROCOD POLST-CPM POLST ER 10-8 MG/5ML PO SUER
5.0000 mL | Freq: Two times a day (BID) | ORAL | Status: DC
  Administered 2016-09-24 – 2016-09-28 (×9): 5 mL via ORAL
  Filled 2016-09-24 (×9): qty 5

## 2016-09-24 MED ORDER — MELATONIN 5 MG PO TABS
10.0000 mg | ORAL_TABLET | Freq: Every day | ORAL | Status: DC
  Administered 2016-09-24 – 2016-09-27 (×4): 10 mg via ORAL
  Filled 2016-09-24 (×5): qty 2

## 2016-09-24 MED ORDER — VITAMIN D 1000 UNITS PO TABS
1000.0000 [IU] | ORAL_TABLET | Freq: Every day | ORAL | Status: DC
Start: 2016-09-24 — End: 2016-09-28
  Administered 2016-09-24 – 2016-09-28 (×5): 1000 [IU] via ORAL
  Filled 2016-09-24 (×5): qty 1

## 2016-09-24 MED ORDER — ALBUTEROL SULFATE (2.5 MG/3ML) 0.083% IN NEBU
INHALATION_SOLUTION | RESPIRATORY_TRACT | Status: AC
  Administered 2016-09-24: 2.5 mg
  Filled 2016-09-24: qty 3

## 2016-09-24 MED ORDER — ACETAMINOPHEN 325 MG PO TABS
650.0000 mg | ORAL_TABLET | Freq: Four times a day (QID) | ORAL | Status: DC | PRN
  Filled 2016-09-24: qty 2

## 2016-09-24 MED ORDER — IPRATROPIUM-ALBUTEROL 0.5-2.5 (3) MG/3ML IN SOLN
3.0000 mL | RESPIRATORY_TRACT | Status: DC
  Administered 2016-09-24 – 2016-09-25 (×7): 3 mL via RESPIRATORY_TRACT
  Filled 2016-09-24 (×7): qty 3

## 2016-09-24 MED ORDER — BENZONATATE 100 MG PO CAPS
100.0000 mg | ORAL_CAPSULE | Freq: Once | ORAL | Status: AC
  Administered 2016-09-24: 100 mg via ORAL
  Filled 2016-09-24: qty 1

## 2016-09-24 MED ORDER — METHYLPREDNISOLONE SODIUM SUCC 125 MG IJ SOLR
60.0000 mg | Freq: Three times a day (TID) | INTRAMUSCULAR | Status: DC
  Administered 2016-09-24 – 2016-09-28 (×12): 60 mg via INTRAVENOUS
  Filled 2016-09-24 (×12): qty 2

## 2016-09-24 MED ORDER — METHYLPREDNISOLONE SODIUM SUCC 125 MG IJ SOLR
125.0000 mg | Freq: Once | INTRAMUSCULAR | Status: AC
  Administered 2016-09-24: 125 mg via INTRAVENOUS
  Filled 2016-09-24: qty 2

## 2016-09-24 NOTE — Evaluation (Signed)
Clinical/Bedside Swallow Evaluation Patient Details  Name: Daniel Dickson MRN: [id number] Date of Birth: 07-20-1912  Today's Date: 09/24/2016 Time: SLP Start Time (ACUTE ONLY): 1610 SLP Stop Time (ACUTE ONLY): 1710 SLP Time Calculation (min) (ACUTE ONLY): 60 min  Past Medical History:  Past Medical History:  Diagnosis Date  . Anxiety   . COPD (chronic obstructive pulmonary disease) (HCC)   . Coronary artery disease    a. 2012 s/p MI/cardiac arrest--> PCI mRCA.  Marland Kitchen Hypertension   . Hypertensive heart disease   . Mixed hyperlipidemia   . PTSD (post-traumatic stress disorder)    with headaches   Past Surgical History:  Past Surgical History:  Procedure Laterality Date  . CATARACT EXTRACTION    . CHOLECYSTECTOMY    . CORONARY ANGIOPLASTY     with stent; [facility]  . HAND SURGERY    . HEMORRHOID SURGERY    . HIP SURGERY    . VASCULAR SURGERY     stent R femoral artery   HPI:  Pt  is a 83 y.o. male with a known history of COPD not on home oxygen, CAD, hypertension, PTSD who stays at home and cares for his adult son with Daniel Dickson syndrome came in today with worsening cough and breathing difficulty. Patient says that he has had about 10 ER visits in the past ER for the same complaints. 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 Pt has had a Barium study (GI) per chart; recent CXRs indicating Chronic lung disease with bibasilar scarring but no acute airspace disease currently. On a PPI baseline. NSG reported pt does drink Ensure at home w/out difficulty.   Assessment / Plan / Recommendation Clinical Impression  Pt appears to present w/ adequate oropharyngeal phase swallowing function w/ no overt s/s of aspiration noted during oral intake; no immediate coughing w/ po trials of thin liquids via Cup or puree/soft solids; no decline in respiratory status or vocal quality during trials was noted. Oral phase appeared adequate for oral phase management and control  of boluses. With increased textured foods(meats), pt exhibited min increased mastication time/effort and seemed to appreciate mixing foods to add cohesion and moisture as well as using a small sip of liquid to aid oropharyngeal and Esophageal clearing. Suspect pt could be suffering from Esophageal phase DYSMOTILITY d/t his c/o rendered: globus feeling, regurgitation feeling, cough at end and after meals. Education was given on general aspiration precautions; REFLUX precautions; food options and consistency/preparation. Hopefully, this will improve pt's overall toleration for foods/oral intake while lessening REFLUX behavior and aspiration of such.  SLP Visit Diagnosis: Dysphagia, pharyngoesophageal phase (R13.14) (Esophageal phase dysmotility)    Aspiration Risk   (reduced from an oropharyngeal phase standpoint)    Diet Recommendation  Dysphagia level 2(minced foods) w/ Thin liquids; general aspiration precautions; REFLUX precautions  Medication Administration: Whole meds with liquid (or whole in Puree if easier for swallowing)    Other  Recommendations Recommended Consults: Consider GI evaluation;Consider esophageal assessment (management of potential REFLUX ) Oral Care Recommendations: Oral care BID;Patient independent with oral care;Staff/trained caregiver to provide oral care   Follow up Recommendations None      Frequency and Duration            Prognosis Prognosis for Safe Diet Advancement: Good Barriers to Reach Goals:  (age)      Swallow Study   General Date of Onset: 09/24/16 HPI: Pt  is a 83 y.o. male with a known history  of COPD not on home oxygen, CAD, hypertension, PTSD who stays at home and cares for his adult son with Daniel PinchDowns syndrome came in today with worsening cough and breathing difficulty. Patient says that he has had about 10 ER visits in the past ER for the same complaints. 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 Pt has had a  Barium study (GI) per chart; recent CXRs indicating Chronic lung disease with bibasilar scarring but no acute airspace disease currently. On a PPI baseline. NSG reported pt does drink Ensure at home w/out difficulty. Type of Study: Bedside Swallow Evaluation Previous Swallow Assessment: none indicated Diet Prior to this Study: Regular;Thin liquids Temperature Spikes Noted: No (wbc 7.1) Respiratory Status: Nasal cannula (2 liters) History of Recent Intubation: No Behavior/Cognition: Alert;Cooperative;Pleasant mood;Distractible;Requires cueing (talkative) Oral Cavity Assessment: Within Functional Limits Oral Care Completed by SLP: Recent completion by staff Oral Cavity - Dentition: Dentures, top;Dentures, bottom Vision: Functional for self-feeding Self-Feeding Abilities: Able to feed self;Needs set up Patient Positioning: Upright in bed Baseline Vocal Quality: Normal Volitional Cough: Strong Volitional Swallow: Able to elicit    Oral/Motor/Sensory Function Overall Oral Motor/Sensory Function: Within functional limits   Ice Chips Ice chips: Not tested   Thin Liquid Thin Liquid: Within functional limits Presentation: Cup;Self Fed (no straws; ~3 ozs total of milk and water) Other Comments: took small sips slowly    Nectar Thick Nectar Thick Liquid: Not tested   Honey Thick Honey Thick Liquid: Not tested   Puree Puree: Within functional limits Presentation: Self Fed;Spoon (5 trials)   Solid   GO   Solid: Impaired (minced, chopped foods; 5 trials moistened) Presentation: Self Fed;Spoon Oral Phase Impairments: Impaired mastication Oral Phase Functional Implications: Impaired mastication (min lengthy) Pharyngeal Phase Impairments:  (none) Other Comments: needed moisture and cohesiveness of potatoes and/or sips of liquid intermittently to aid clearing         [name], MS, CCC-SLP Watson,Katherine 09/24/2016,6:02 PM

## 2016-09-24 NOTE — ED Notes (Signed)
Patient still wheezing throughout all lung fields. Patient placed on 1L o2 [place] for comfort. Patient has audible wheezing but patient states he feels like he is breathing better.

## 2016-09-24 NOTE — Progress Notes (Signed)
PT Cancellation Note  Patient Details Name: Daniel Dickson MRN: [id number] DOB: 10/14/1912   Cancelled Treatment:    Reason Eval/Treat Not Completed: Fatigue/lethargy limiting ability to participate.  Has not slept much and has O2 sats down to 90% at rest.  Will try later as pt will allow.   Ivar DrapeRuth E Albertus Chiarelli 09/24/2016, 10:31 AM   Samul Dadauth Tonette Koehne, PT MS Acute Rehab Dept. Number: [facility] [phone] and [facility] [phone]

## 2016-09-24 NOTE — ED Triage Notes (Signed)
Patient c/o shortness of breath, non-productive cough. Pt dyspneic, wheezing throughout all fields. Pt has hx of COPD.

## 2016-09-24 NOTE — ED Notes (Signed)
Patient is back to wheezing audibly again. edp aware. Verbal order for albuterol neb treatment

## 2016-09-24 NOTE — H&P (Signed)
[facility] - [place] [facility]   PATIENT NAME: Daniel Dickson    MR#:  [id number]  DATE OF BIRTH:  12/25/1912  DATE OF ADMISSION:  09/24/2016  PRIMARY CARE PHYSICIAN: Dortha KernBliss, Laura K, MD   REQUESTING/REFERRING PHYSICIAN: Dr. Lucrezia EuropeAllison Webster  CHIEF COMPLAINT:   Chief Complaint  Patient presents with  . Respiratory Distress    HISTORY OF PRESENT ILLNESS:  Daniel Dickson  is a [age] y.o. male with a known history of COPD not on home oxygen, CAD, hypertension, PTSD who stays at home and cares for his adult son with Jeral PinchDowns syndrome came in today with worsening cough and breathing difficulty. Patient says that he has had about 10 ER visits in the past ER for the same complaints. He has been using his nebs without much help, cough has been worse to the point that he almost passed out. Denies any fevers or chills. Cough has been productive with yellowish phlegm. Denies any recent travel, sick contacts. CXR is clear, has been hypoxic in ED and requiring several nebs for his wheezing. So being admitted.   PAST MEDICAL HISTORY:   Past Medical History:  Diagnosis Date  . Anxiety   . COPD (chronic obstructive pulmonary disease) (HCC)   . Coronary artery disease    a. 2012 s/p MI/cardiac arrest--> PCI mRCA.  Marland Kitchen. Hypertension   . Hypertensive heart disease   . Mixed hyperlipidemia   . PTSD (post-traumatic stress disorder)    with headaches    PAST SURGICAL HISTORY:   Past Surgical History:  Procedure Laterality Date  . CATARACT EXTRACTION    . CHOLECYSTECTOMY    . CORONARY ANGIOPLASTY     with stent; [facility]  . HAND SURGERY    . HEMORRHOID SURGERY    . HIP SURGERY    . VASCULAR SURGERY     stent R femoral artery    SOCIAL HISTORY:   Social History  Substance Use Topics  . Smoking status: Former Smoker    Packs/day: 3.00    Years: 40.00    Types: Cigarettes  . Smokeless tobacco: Never Used     Comment: quit 62 years  . Alcohol use Yes     Comment: wine at  night.     FAMILY HISTORY:   Family History  Problem Relation Age of Onset  . COPD Father   . Bladder Cancer Neg Hx   . Kidney cancer Neg Hx   . Prostate cancer Neg Hx     DRUG ALLERGIES:   Allergies  Allergen Reactions  . Cortisone Other (See Comments)    Other reaction(s): Other (See Comments) GI Upset Unknown reaction  . Dye Fdc Red [Red Dye] Other (See Comments)    Unknown reaction.  . Iodine (Kelp)  [Iodine]     Other reaction(s): Other (See Comments) GI Upset  . Peanut Oil     Other reaction(s): Unknown  . Peanut-Containing Drug Products     Other reaction(s): Unknown  . Penicillins Rash and Other (See Comments)    GI Upset patient not sure of reaction Has patient had a PCN reaction causing immediate rash, facial/tongue/throat swelling, SOB or lightheadedness with hypotension: Unknown Has patient had a PCN reaction causing severe rash involving mucus membranes or skin necrosis: Unknown Has patient had a PCN reaction that required hospitalization: Unknown Has patient had a PCN reaction occurring within the last 10 years: Unknown If all of the above answers are "NO", then may proceed with Cephalosporin use.  REVIEW OF SYSTEMS:   Review of Systems  Constitutional: Positive for malaise/fatigue. Negative for chills, fever and weight loss.  HENT: Negative for ear discharge, ear pain, hearing loss and nosebleeds.   Eyes: Negative for blurred vision, double vision and photophobia.  Respiratory: Positive for cough and shortness of breath. Negative for hemoptysis and wheezing.   Cardiovascular: Negative for chest pain, palpitations, orthopnea and leg swelling.  Gastrointestinal: Negative for abdominal pain, constipation, diarrhea, heartburn, melena, nausea and vomiting.  Genitourinary: Negative for dysuria, frequency and urgency.  Musculoskeletal: Negative for back pain, myalgias and neck pain.  Skin: Negative for rash.  Neurological: Negative for dizziness,  tingling, tremors, sensory change, speech change, focal weakness and headaches.  Endo/Heme/Allergies: Does not bruise/bleed easily.  Psychiatric/Behavioral: Negative for depression.    MEDICATIONS AT HOME:   Prior to Admission medications   Medication Sig Start Date End Date Taking? Authorizing Provider  acetaminophen (TYLENOL) 500 MG tablet Take 500 mg by mouth daily as needed for mild pain or moderate pain.    Yes [provider]  albuterol (PROAIR HFA) 108 (90 BASE) MCG/ACT inhaler Inhale 2 puffs into the lungs every 6 (six) hours as needed. For wheezing.   Yes [provider]  albuterol (PROVENTIL) (2.5 MG/3ML) 0.083% nebulizer solution Take 3 mLs (2.5 mg total) by nebulization every 8 (eight) hours as needed for wheezing or shortness of breath. 10/09/15  Yes Mody, Patricia Pesa, MD  antiseptic oral rinse (BIOTENE) LIQD 15 mLs by Mouth Rinse route as needed for dry mouth.   Yes [provider]  aspirin EC 81 MG tablet Take 81 mg by mouth daily.   Yes [provider]  Cholecalciferol (VITAMIN D3) 1000 UNITS CAPS Take 1,000 Units by mouth daily.   Yes [provider]  isosorbide mononitrate (IMDUR) 30 MG 24 hr tablet Take 1 tablet (30 mg total) by mouth daily. 11/02/15  Yes Katharina Caper, MD  loratadine (CLARITIN) 10 MG tablet Take 10 mg by mouth daily.   Yes [provider]  magnesium hydroxide (MILK OF MAGNESIA) 400 MG/5ML suspension Take 30 mLs by mouth 2 (two) times daily as needed for mild constipation or moderate constipation. 11/02/15  Yes Katharina Caper, MD  Melatonin 5 MG CAPS Take 10 mg by mouth at bedtime.   Yes [provider]  nitroGLYCERIN (NITROSTAT) 0.4 MG SL tablet Place 0.4 mg under the tongue every 5 (five) minutes as needed for chest pain. Reported on 10/19/2015   Yes [provider]  oxybutynin (DITROPAN-XL) 5 MG 24 hr tablet Take as needed for bladder spasms up to three times a day 07/27/16  Yes McGowan, Carollee Herter  A, PA-C  pantoprazole (PROTONIX) 40 MG tablet Take 1 tablet (40 mg total) by mouth daily. 11/02/15  Yes Katharina Caper, MD  phenazopyridine (PYRIDIUM) 100 MG tablet Take 100 mg by mouth 3 (three) times daily with meals.   Yes [provider]  pravastatin (PRAVACHOL) 80 MG tablet Take 40 mg by mouth daily.   Yes [provider]  senna-docusate (SENOKOT-S) 8.6-50 MG tablet Take 2 tablets by mouth 2 (two) times daily. 09/08/16  Yes Sharman Cheek, MD  tamsulosin (FLOMAX) 0.4 MG CAPS capsule Take 0.4 mg by mouth daily.    Yes [provider]  tiotropium (SPIRIVA) 18 MCG inhalation capsule Place 1 capsule (18 mcg total) into inhaler and inhale every evening. 10/09/15 10/08/16 Yes Mody, Patricia Pesa, MD  benzonatate (TESSALON PERLES) 100 MG capsule Take 1 capsule (100 mg total) by mouth every  6 (six) hours as needed for cough. Patient not taking: Reported on 09/08/2016 04/06/16 04/06/17  Phineas Semen, MD  cephALEXin (KEFLEX) 500 MG capsule Take 1 capsule (500 mg total) by mouth 2 (two) times daily. Patient not taking: Reported on 09/24/2016 09/08/16   Sharman Cheek, MD  feeding supplement (BOOST / RESOURCE BREEZE) LIQD Take 1 Container by mouth 3 (three) times daily between meals. 11/02/15   Katharina Caper, MD      VITAL SIGNS:  Blood pressure 121/70, pulse 94, temperature 97.7 F (36.5 C), temperature source Oral, resp. rate 20, height 5\' 6"  (1.676 m), weight 58.3 kg (128 lb 8 oz), SpO2 98 %.  PHYSICAL EXAMINATION:   Physical Exam  GENERAL:  83 y.o.-year-old elderly patient lying in the bed with no acute distress.  EYES: Pupils equal, round, reactive to light and accommodation. No scleral icterus. Extraocular muscles intact.  HEENT: Head atraumatic, normocephalic. Oropharynx and nasopharynx clear.  NECK:  Supple, no jugular venous distention. No thyroid enlargement, no tenderness.  LUNGS: scant breath sounds bilaterally, minimal scattered wheezing, no rales,rhonchi or  crepitation. No use of accessory muscles of respiration.  CARDIOVASCULAR: S1, S2 normal. No  rubs, or gallops. 3/6 systolic murmur present ABDOMEN: Soft, nontender, nondistended. Bowel sounds present. No organomegaly or mass.  EXTREMITIES: No pedal edema, cyanosis, or clubbing.  NEUROLOGIC: Cranial nerves II through XII are intact. Muscle strength 5/5 in all extremities. Sensation intact. Gait not checked.  PSYCHIATRIC: The patient is alert and oriented x 3.  SKIN: No obvious rash, lesion, or ulcer.   LABORATORY PANEL:   CBC  Recent Labs Lab 09/24/16 0258  WBC 7.1  HGB 13.8  HCT 40.1  PLT 216   ------------------------------------------------------------------------------------------------------------------  Chemistries   Recent Labs Lab 09/24/16 0258  NA 133*  K 4.5  CL 100*  CO2 26  GLUCOSE 105*  BUN 24*  CREATININE 1.14  CALCIUM 9.4  AST 26  ALT 17  ALKPHOS 44  BILITOT 0.6   ------------------------------------------------------------------------------------------------------------------  Cardiac Enzymes  Recent Labs Lab 09/24/16 0258  TROPONINI <0.03   ------------------------------------------------------------------------------------------------------------------  RADIOLOGY:  Dg Chest 2 View  Result Date: 09/24/2016 CLINICAL DATA:  Shortness of breath and nonproductive cough EXAM: CHEST  2 VIEW COMPARISON:  Chest radiograph 09/08/2016 FINDINGS: Cardiomegaly and calcific aortic atherosclerosis are unchanged. No focal consolidation or pulmonary edema. No pneumothorax or pleural effusion. IMPRESSION: Cardiomegaly and calcific calcific aortic atherosclerosis. No acute airspace disease. Electronically Signed   By: Deatra Robinson M.D.   On: 09/24/2016 04:15    EKG:   Orders placed or performed during the hospital encounter of 09/24/16  . ED EKG  . ED EKG  . EKG 12-Lead  . EKG 12-Lead  . EKG 12-Lead  . EKG 12-Lead    IMPRESSION AND PLAN:   Maurilio Puryear  is a 83 y.o. male with a known history of COPD not on home oxygen, CAD, hypertension, PTSD who stays at home and cares for [name] syndrome came in today with worsening cough and breathing difficulty.  #1 Acute on chronic COPD exacerbation- admit, IV steroids Supplemental o2 to maintain sats>90% - duonebs, inh - add levaquin for bronchitis symptoms, cough meds  #2 Dysphagia- swallow eval- soft diet until then  #3 BPH- chronic urinary retention- has chronic foley Cont home meds- flomax  #4 Hyperlipidemia- statin  #5 DVT Prophylaxis- lovenox  PT consult    All the records are reviewed and case discussed with ED provider. Management plans discussed with  the patient, family and they are in agreement.  CODE STATUS: Full Code  TOTAL TIME TAKING CARE OF THIS PATIENT: 50 minutes.    Enid Baas M.D on 09/24/2016 at 5:10 PM  Between 7am to 6pm - Pager - [phone]  After 6pm go to [email or web address] - password [email or web address]  [facility]  Office  [phone]  CC: Primary care physician; [name], MD

## 2016-09-24 NOTE — ED Provider Notes (Signed)
[facility] Emergency Department Provider Note   ____________________________________________   First MD Initiated Contact with Patient 09/24/16 0309     (approximate)  I have reviewed the triage vital signs and the nursing notes.   HISTORY  Chief Complaint Respiratory Distress    HPI Daniel Dickson is a [age] y.o. male who comes into the hospital today with shortness of breath. The patient reports he can't breathe. He states he's had symptoms for a year but it's worse tonight. He reports that he sat up and thought he was in a faint. He has been diagnosed with COPD in the past. He reports that he uses a breathing machine about 5 times a day. He last used at 11:00 and then states that it didn't help. He states his breathing just got worse. He used his inhaler right before he came into the hospital. The patient denies any chest pain, nausea, vomiting, abdominal pain. He's had a nonproductive cough. The patient reports that he's been in and out of the hospital with the same symptoms multiple times this year. He is here today for evaluation.   Past Medical History:  Diagnosis Date  . Anxiety   . COPD (chronic obstructive pulmonary disease) (HCC)   . Coronary artery disease    a. 2012 s/p MI/cardiac arrest--> PCI mRCA.  Marland Kitchen. Hypertension   . Hypertensive heart disease   . Mixed hyperlipidemia   . PTSD (post-traumatic stress disorder)    with headaches    Patient Active Problem List   Diagnosis Date Noted  . Gross hematuria   . Acute respiratory failure (HCC) 02/18/2016  . Urinary retention 11/10/2015  . Hyperkalemia 11/02/2015  . Hypotension 11/02/2015  . Dysphagia, pharyngoesophageal phase 11/02/2015  . Esophageal spasm 11/02/2015  . Constipation 11/02/2015  . SIADH (syndrome of inappropriate ADH production) (HCC) 11/02/2015  . Urinary retention due to benign prostatic hyperplasia 11/02/2015  . Chronic indwelling Foley catheter 11/02/2015  .  Bacteriuria with pyuria 11/02/2015  . Protein-calorie malnutrition, severe 10/30/2015  . ARF (acute renal failure) (HCC) 10/29/2015  . Dyspnea 10/07/2015  . Hyponatremia 06/06/2015  . Coronary artery disease   . Hypertensive heart disease   . Mixed hyperlipidemia   . COPD (chronic obstructive pulmonary disease) (HCC)   . COPD exacerbation (HCC) 01/11/2015  . Moderate COPD (chronic obstructive pulmonary disease) (HCC) 12/08/2014  . Fall 09/03/2014  . Hip fracture (HCC) 09/03/2014  . PTSD (post-traumatic stress disorder) 09/03/2014  . Bilateral leg edema 09/03/2014  . SOB (shortness of breath) 10/31/2012  . Essential hypertension 10/31/2012  . CAD (coronary artery disease) 10/31/2012  . Hyperlipidemia 10/31/2012  . Tachycardia 10/31/2012    Past Surgical History:  Procedure Laterality Date  . CATARACT EXTRACTION    . CHOLECYSTECTOMY    . CORONARY ANGIOPLASTY     with stent; [facility]  . HAND SURGERY    . HEMORRHOID SURGERY    . HIP SURGERY    . VASCULAR SURGERY     stent R femoral artery    Prior to Admission medications   Medication Sig Start Date End Date Taking? Authorizing Provider  acetaminophen (TYLENOL) 500 MG tablet Take 500 mg by mouth daily as needed for mild pain or moderate pain.    Yes [provider]  albuterol (PROAIR HFA) 108 (90 BASE) MCG/ACT inhaler Inhale 2 puffs into the lungs every 6 (six) hours as needed. For wheezing.   Yes [provider]  albuterol (PROVENTIL) (2.5 MG/3ML) 0.083% nebulizer solution Take  3 mLs (2.5 mg total) by nebulization every 8 (eight) hours as needed for wheezing or shortness of breath. 10/09/15  Yes Mody, Patricia Pesa, MD  antiseptic oral rinse (BIOTENE) LIQD 15 mLs by Mouth Rinse route as needed for dry mouth.   Yes [provider]  aspirin EC 81 MG tablet Take 81 mg by mouth daily.   Yes [provider]  Cholecalciferol (VITAMIN D3) 1000 UNITS CAPS Take 1,000 Units by mouth daily.   Yes [provider]  isosorbide mononitrate (IMDUR) 30 MG 24 hr tablet Take 1 tablet (30 mg total) by mouth daily. 11/02/15  Yes Katharina Caper, MD  loratadine (CLARITIN) 10 MG tablet Take 10 mg by mouth daily.   Yes [provider]  magnesium hydroxide (MILK OF MAGNESIA) 400 MG/5ML suspension Take 30 mLs by mouth 2 (two) times daily as needed for mild constipation or moderate constipation. 11/02/15  Yes Katharina Caper, MD  Melatonin 5 MG CAPS Take 10 mg by mouth at bedtime.   Yes [provider]  nitroGLYCERIN (NITROSTAT) 0.4 MG SL tablet Place 0.4 mg under the tongue every 5 (five) minutes as needed for chest pain. Reported on 10/19/2015   Yes [provider]  oxybutynin (DITROPAN-XL) 5 MG 24 hr tablet Take as needed for bladder spasms up to three times a day 07/27/16  Yes McGowan, Carollee Herter A, PA-C  pantoprazole (PROTONIX) 40 MG tablet Take 1 tablet (40 mg total) by mouth daily. 11/02/15  Yes Katharina Caper, MD  phenazopyridine (PYRIDIUM) 100 MG tablet Take 100 mg by mouth 3 (three) times daily with meals.   Yes [provider]  pravastatin (PRAVACHOL) 80 MG tablet Take 40 mg by mouth daily.   Yes [provider]  senna-docusate (SENOKOT-S) 8.6-50 MG tablet Take 2 tablets by mouth 2 (two) times daily. 09/08/16  Yes Sharman Cheek, MD  tamsulosin (FLOMAX) 0.4 MG CAPS capsule Take 0.4 mg by mouth daily.    Yes [provider]  tiotropium (SPIRIVA) 18 MCG inhalation capsule Place 1 capsule (18 mcg total) into inhaler and inhale every evening. 10/09/15 10/08/16 Yes Mody, Patricia Pesa, MD  benzonatate (TESSALON PERLES) 100 MG capsule Take 1 capsule (100 mg total) by mouth every 6 (six) hours as needed for cough. Patient not taking: Reported on 09/08/2016 04/06/16 04/06/17  Phineas Semen, MD  cephALEXin (KEFLEX) 500 MG capsule Take 1 capsule (500 mg total) by mouth 2 (two) times daily. Patient not taking: Reported on 09/24/2016 09/08/16   Sharman Cheek, MD    feeding supplement (BOOST / RESOURCE BREEZE) LIQD Take 1 Container by mouth 3 (three) times daily between meals. 11/02/15   Katharina Caper, MD    Allergies Cortisone; Dye fdc red [red dye]; Iodine (kelp)  [iodine]; Peanut oil; Peanut-containing drug products; and Penicillins  Family History  Problem Relation Age of Onset  . COPD Father   . Bladder Cancer Neg Hx   . Kidney cancer Neg Hx   . Prostate cancer Neg Hx     Social History Social History  Substance Use Topics  . Smoking status: Former Smoker    Packs/day: 3.00    Years: 40.00    Types: Cigarettes  . Smokeless tobacco: Never Used     Comment: quit 62 years  . Alcohol use Yes     Comment: wine at night.     Review of Systems  Constitutional: No fever/chills Eyes: No visual changes. ENT: No sore throat. Cardiovascular: Denies chest pain. Respiratory: Cough, wheezing and shortness of  breath. Gastrointestinal: No abdominal pain.  No nausea, no vomiting.  No diarrhea.  No constipation. Genitourinary: Negative for dysuria. Musculoskeletal: Negative for back pain. Skin: Negative for rash. Neurological: Pre-syncope   ____________________________________________   PHYSICAL EXAM:  VITAL SIGNS: ED Triage Vitals  Enc Vitals Group     BP 09/24/16 0411 159/105     Pulse 09/24/16 0411 97     Resp 09/24/16 0411 16     Temp --      Temp src --      SpO2 09/24/16 0255 (!) 89 %     Weight 09/24/16 0257 124 lb (56.2 kg)     Height 09/24/16 0257 5\' 6"  (1.676 m)     Head Circumference --      Peak Flow --      Pain Score 09/24/16 0257 0     Pain Loc --      Pain Edu? --      Excl. in GC? --     Constitutional: Alert and oriented. Well appearing and in moderate respiratory distress. Eyes: Conjunctivae are normal. PERRL. EOMI. Head: Atraumatic. Nose: No congestion/rhinnorhea. Mouth/Throat: Mucous membranes are moist.  Oropharynx non-erythematous. Cardiovascular: Normal rate, regular rhythm. Grossly normal heart  sounds.  Good peripheral circulation. Respiratory: Normal respiratory effort.  No retractions. Diffuse expiratory wheezes throughout all lung fields. Gastrointestinal: Soft and nontender. No distention. No abdominal bruits. No CVA tenderness. Musculoskeletal: No lower extremity tenderness nor edema.   Neurologic:  Normal speech and language.  Skin:  Skin is warm, dry and intact.  Psychiatric: Mood and affect are normal.   ____________________________________________   LABS (all labs ordered are listed, but only abnormal results are displayed)  Labs Reviewed  CBC WITH DIFFERENTIAL/PLATELET - Abnormal; Notable for the following:       Result Value   RBC 4.17 (*)    RDW 14.7 (*)    Eosinophils Absolute 0.8 (*)    All other components within normal limits  COMPREHENSIVE METABOLIC PANEL - Abnormal; Notable for the following:    Sodium 133 (*)    Chloride 100 (*)    Glucose, Bld 105 (*)    BUN 24 (*)    GFR calc non Af Amer 50 (*)    GFR calc Af Amer 58 (*)    All other components within normal limits  BLOOD GAS, VENOUS - Abnormal; Notable for the following:    pCO2, Ven 41 (*)    pO2, Ven 47.0 (*)    All other components within normal limits  TROPONIN I   ____________________________________________  EKG  ED ECG REPORT I, Rebecka Apley, the attending physician, personally viewed and interpreted this ECG.   Date: 09/24/2016  EKG Time: 305  Rate: 99  Rhythm: normal sinus rhythm  Axis: Left axis deviation  Intervals:right bundle branch block and left anterior fascicular block  ST&T Change: Flipped T waves in leads V1, V2, V3, widened QRS. No ST segment elevation.  ____________________________________________  RADIOLOGY  Dg Chest 2 View  Result Date: 09/24/2016 CLINICAL DATA:  Shortness of breath and nonproductive cough EXAM: CHEST  2 VIEW COMPARISON:  Chest radiograph 09/08/2016 FINDINGS: Cardiomegaly and calcific aortic atherosclerosis are unchanged. No focal  consolidation or pulmonary edema. No pneumothorax or pleural effusion. IMPRESSION: Cardiomegaly and calcific calcific aortic atherosclerosis. No acute airspace disease. Electronically Signed   By: Deatra Robinson M.D.   On: 09/24/2016 04:15    ____________________________________________   PROCEDURES  Procedure(s) performed: None  Procedures  Critical  Care performed: No  ____________________________________________   INITIAL IMPRESSION / ASSESSMENT AND PLAN / ED COURSE  Pertinent labs & imaging results that were available during my care of the patient were reviewed by me and considered in my medical decision making (see chart for details).  This is 83 year old male who comes into the hospital today with some shortness of breath. The patient was wheezing significantly when he arrived. I did give the patient some duo nebs and Solu-Medrol. The patient also received a dose of magnesium sulfate. After the treatments he reports that he feels improved. The patient does still have some wheezing though and his sats are still in the high 80s. They are 89-91% on room air. I will admit the patient to the hospitalist service for COPD exacerbation. The patient has no other complaints at this time.  Clinical Course as of Sep 25 722  Sat Sep 24, 2016  4098 Cardiomegaly and calcific calcific aortic atherosclerosis. No acute airspace disease.   DG Chest 2 View [AW]    Clinical Course User Index [AW] Rebecka Apley, MD     ____________________________________________   FINAL CLINICAL IMPRESSION(S) / ED DIAGNOSES  Final diagnoses:  COPD exacerbation (HCC)      NEW MEDICATIONS STARTED DURING THIS VISIT:  New Prescriptions   No medications on file     Note:  This document was prepared using Dragon voice recognition software and may include unintentional dictation errors.    Rebecka Apley, MD 09/24/16 [phone]

## 2016-09-24 NOTE — ED Notes (Signed)
This RN to bedside, pt resting in bed with eyes closed, respirations even and unlabored, skin warm, dry, and intact. VSS and WNL. Will continue to monitor for further patient needs.

## 2016-09-24 NOTE — ED Notes (Signed)
Patient complaining that magnesium is burning going in. Slowed rate from 3250ml/hr to 125ml/hr. edp aware

## 2016-09-24 NOTE — ED Notes (Signed)
Took patient off O2 to see how he does per EDP

## 2016-09-25 LAB — BASIC METABOLIC PANEL
Anion gap: 7 (ref 5–15)
BUN: 28 mg/dL — AB (ref 6–20)
CALCIUM: 9.1 mg/dL (ref 8.9–10.3)
CHLORIDE: 98 mmol/L — AB (ref 101–111)
CO2: 25 mmol/L (ref 22–32)
CREATININE: 1.11 mg/dL (ref 0.61–1.24)
GFR calc non Af Amer: 52 mL/min — ABNORMAL LOW (ref >60)
GFR, EST AFRICAN AMERICAN: 60 mL/min — AB (ref >60)
GLUCOSE: 150 mg/dL — AB (ref 65–99)
Potassium: 4.4 mmol/L (ref 3.5–5.1)
Sodium: 130 mmol/L — ABNORMAL LOW (ref 135–145)

## 2016-09-25 LAB — CBC
HCT: 36.3 % — ABNORMAL LOW (ref 40.0–52.0)
Hemoglobin: 12.3 g/dL — ABNORMAL LOW (ref 13.0–18.0)
MCH: 32.4 pg (ref 26.0–34.0)
MCHC: 33.9 g/dL (ref 32.0–36.0)
MCV: 95.8 fL (ref 80.0–100.0)
Platelets: 191 10*3/uL (ref 150–440)
RBC: 3.79 MIL/uL — ABNORMAL LOW (ref 4.40–5.90)
RDW: 14.5 % (ref 11.5–14.5)
WBC: 5 10*3/uL (ref 3.8–10.6)

## 2016-09-25 MED ORDER — LEVOFLOXACIN 250 MG PO TABS: 250.0000 mg | ORAL_TABLET | Freq: Every day | ORAL | 0 refills | Status: DC

## 2016-09-25 MED ORDER — ADULT MULTIVITAMIN W/MINERALS CH
1.0000 | ORAL_TABLET | Freq: Every day | ORAL | Status: DC
  Administered 2016-09-26 – 2016-09-28 (×3): 1 via ORAL
  Filled 2016-09-25 (×3): qty 1

## 2016-09-25 MED ORDER — ENSURE ENLIVE PO LIQD
237.0000 mL | Freq: Two times a day (BID) | ORAL | Status: DC
  Administered 2016-09-26 – 2016-09-28 (×5): 237 mL via ORAL

## 2016-09-25 MED ORDER — PANTOPRAZOLE SODIUM 40 MG PO TBEC: 40.0000 mg | DELAYED_RELEASE_TABLET | Freq: Two times a day (BID) | ORAL | 2 refills | Status: DC

## 2016-09-25 MED ORDER — IPRATROPIUM-ALBUTEROL 0.5-2.5 (3) MG/3ML IN SOLN
3.0000 mL | Freq: Four times a day (QID) | RESPIRATORY_TRACT | Status: DC
  Administered 2016-09-25 – 2016-09-26 (×2): 3 mL via RESPIRATORY_TRACT
  Filled 2016-09-25 (×3): qty 3

## 2016-09-25 MED ORDER — PREDNISONE 10 MG (21) PO TBPK: ORAL_TABLET | ORAL | 0 refills | Status: DC

## 2016-09-25 MED ORDER — BENZONATATE 100 MG PO CAPS: 100.0000 mg | ORAL_CAPSULE | Freq: Three times a day (TID) | ORAL | 0 refills | Status: DC

## 2016-09-25 MED ORDER — HYDROCOD POLST-CPM POLST ER 10-8 MG/5ML PO SUER
5.0000 mL | Freq: Two times a day (BID) | ORAL | 0 refills | Status: DC
Start: 2016-09-25 — End: 2016-11-11

## 2016-09-25 NOTE — Care Management Obs Status (Signed)
MEDICARE OBSERVATION STATUS NOTIFICATION   Patient Details  Name: Daniel Dickson MRN: [id number] Date of Birth: 08/03/1912   Medicare Observation Status Notification Given:  Yes (code 6244 MOON)    Caren MacadamMichelle Isiac Breighner, RN 09/25/2016, 11:46 AM

## 2016-09-25 NOTE — Evaluation (Signed)
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  He was able to walk only a short trip on room air and then desaturated to 86%.  His nurse was notified and contacted MD to ask for O2 orders for home.  Will follow acutely as needed for strengthening and gait, have HHPT follow up at home to increase standing endurance and standing balance for gait and transfers.    Follow Up Recommendations Home health PT;Supervision for mobility/OOB    Equipment Recommendations  Rolling walker with 5" wheels (if needed)    Recommendations for Other Services       Precautions / Restrictions Precautions Precautions: Fall (telemetry) Restrictions Weight Bearing Restrictions: No      Mobility  Bed Mobility Overal bed mobility: Modified Independent             General bed mobility comments: HOB elevated and using side rail  Transfers Overall transfer level: Modified independent Equipment used: Rolling walker (2 wheeled)             General transfer comment: pt is using bed to push to stand but needs reminders for steadying himself on initial standing  Ambulation/Gait Ambulation/Gait assistance: Min guard Ambulation Distance (Feet): 20 Feet Assistive device: Rolling walker (2 wheeled) Gait Pattern/deviations: Step-to pattern;Step-through pattern;Decreased stride length;Wide base of support;Trunk flexed;Drifts right/left Gait velocity: reduced Gait velocity interpretation: Below normal speed for age/gender General Gait Details: pt is struggling at half way distance, SOB and wheezing on room air.  Desaturation to 86% once sitting to rest.  Stairs             Wheelchair Mobility    Modified Rankin (Stroke Patients Only)       Balance Overall balance assessment: Needs assistance Sitting-balance support: Feet supported Sitting balance-Leahy Scale: Good     Standing balance support: Bilateral upper extremity supported Standing balance-Leahy Scale: Fair                               Pertinent Vitals/Pain Pain Assessment: No/denies pain    Home Living Family/patient expects to be discharged to:: Private residence Living Arrangements: Children Available Help at Discharge: Family Type of Home: House Home Access: Ramped entrance     Home Layout: One level Home Equipment: Environmental consultant - 2 wheels;Walker - 4 wheels;Grab bars - toilet;Wheelchair - manual Additional Comments: Chronic foley.      Prior Function Level of Independence: Independent with assistive device(s)               Hand Dominance   Dominant Hand: Right    Extremity/Trunk Assessment   Upper Extremity Assessment Upper Extremity Assessment: Overall WFL for tasks assessed    Lower Extremity Assessment Lower Extremity Assessment: Overall WFL for tasks assessed    Cervical / Trunk Assessment Cervical / Trunk Assessment: Kyphotic  Communication   Communication: HOH  Cognition Arousal/Alertness: Awake/alert Behavior During Therapy: Restless Overall Cognitive Status: Within Functional Limits for tasks assessed  General Comments      Exercises     Assessment/Plan    PT Assessment Patient needs continued PT services  PT Problem List Decreased strength;Decreased range of motion;Decreased activity tolerance;Decreased balance;Decreased mobility;Decreased coordination;Decreased knowledge of use of DME;Decreased safety awareness;Cardiopulmonary status limiting activity       PT Treatment Interventions DME instruction;Gait training;Functional mobility training;Therapeutic  activities;Therapeutic exercise;Balance training;Neuromuscular re-education;Patient/family education    PT Goals (Current goals can be found in the Care Plan section)  Acute Rehab PT Goals Patient Stated Goal: to restore his steadiness with gait PT Goal Formulation: With patient Time For Goal Achievement: 10/09/16 Potential to Achieve Goals: Good    Frequency Min 2X/week   Barriers to discharge Decreased caregiver support has sporadic help at home    Co-evaluation               AM-PAC PT "6 Clicks" Daily Activity  Outcome Measure Difficulty turning over in bed (including adjusting bedclothes, sheets and blankets)?: A Little Difficulty moving from lying on back to sitting on the side of the bed? : A Little Difficulty sitting down on and standing up from a chair with arms (e.g., wheelchair, bedside commode, etc,.)?: A Little Help needed moving to and from a bed to chair (including a wheelchair)?: A Little Help needed walking in hospital room?: A Little Help needed climbing 3-5 steps with a railing? : A Little 6 Click Score: 18    End of Session Equipment Utilized During Treatment: Oxygen;Gait belt Activity Tolerance: Patient limited by fatigue;Treatment limited secondary to medical complications (Comment) (declining O2 sats) Patient left: in bed;with call bell/phone within reach;with nursing/sitter in room Nurse Communication: Mobility status PT Visit Diagnosis: Unsteadiness on feet (R26.81);Difficulty in walking, not elsewhere classified (R26.2);Other (comment) (Low O2 sats)    Time: [id number] PT Time Calculation (min) (ACUTE ONLY): 32 min   Charges:   PT Evaluation $PT Eval Moderate Complexity: 1 Procedure PT Treatments $Gait Training: 8-22 mins   PT G Codes:   PT G-Codes **NOT FOR INPATIENT CLASS** Functional Assessment Tool Used: AM-PAC 6 Clicks Basic Mobility;Clinical judgement Functional Limitation: Mobility: Walking and moving around Mobility: Walking and  Moving Around Current Status ([id number](G8978): At least 20 percent but less than 40 percent impaired, limited or restricted Mobility: Walking and Moving Around Goal Status [phone](G8979): At least 1 percent but less than 20 percent impaired, limited or restricted    Ivar DrapeRuth E Galen Russman 09/25/2016, 12:27 PM   Samul Dadauth Avyn Coate, PT MS Acute Rehab Dept. Number: [facility] [phone] and [facility] [phone]

## 2016-09-25 NOTE — Progress Notes (Signed)
[facility] - [place] at [facility]   PATIENT NAME: Daniel Dickson    MR#:  [id number]  DATE OF BIRTH:  Sep 17, 1912  SUBJECTIVE:  CHIEF COMPLAINT:   Chief Complaint  Patient presents with  . Respiratory Distress   - Doing well at rest, however became hypoxic on exertion. Will need home oxygen. -Feels too weak to go home today.  REVIEW OF SYSTEMS:  Review of Systems  Constitutional: Negative for chills, fever and malaise/fatigue.  HENT: Negative for congestion, ear discharge, hearing loss and nosebleeds.   Eyes: Negative for blurred vision and double vision.  Respiratory: Positive for cough, shortness of breath and wheezing.   Cardiovascular: Negative for chest pain, palpitations and leg swelling.  Gastrointestinal: Negative for abdominal pain, constipation, diarrhea, nausea and vomiting.  Genitourinary: Negative for dysuria.  Musculoskeletal: Negative for myalgias.  Neurological: Negative for dizziness, speech change, focal weakness, seizures and headaches.  Psychiatric/Behavioral: Negative for depression.    DRUG ALLERGIES:   Allergies  Allergen Reactions  . Cortisone Other (See Comments)    Other reaction(s): Other (See Comments) GI Upset Unknown reaction  . Dye Fdc Red [Red Dye] Other (See Comments)    Unknown reaction.  . Iodine (Kelp)  [Iodine]     Other reaction(s): Other (See Comments) GI Upset  . Peanut Oil     Other reaction(s): Unknown  . Peanut-Containing Drug Products     Other reaction(s): Unknown  . Penicillins Rash and Other (See Comments)    GI Upset patient not sure of reaction Has patient had a PCN reaction causing immediate rash, facial/tongue/throat swelling, SOB or lightheadedness with hypotension: Unknown Has patient had a PCN reaction causing severe rash involving mucus membranes or skin necrosis: Unknown Has patient had a PCN reaction that required hospitalization: Unknown Has patient had a PCN reaction occurring within the  last 10 years: Unknown If all of the above answers are "NO", then may proceed with Cephalosporin use.     VITALS:  Blood pressure 125/72, pulse 98, temperature 97.6 F (36.4 C), temperature source Oral, resp. rate 20, height 5\' 6"  (1.676 m), weight 58.3 kg (128 lb 8 oz), SpO2 100 %.  PHYSICAL EXAMINATION:  Physical Exam  GENERAL:  83 y.o.-year-old elderly patient lying in the bed with no acute distress.  EYES: Pupils equal, round, reactive to light and accommodation. No scleral icterus. Extraocular muscles intact.  HEENT: Head atraumatic, normocephalic. Oropharynx and nasopharynx clear.  NECK:  Supple, no jugular venous distention. No thyroid enlargement, no tenderness.  LUNGS: scant breath sounds bilaterally, minimal scattered wheezing, no rales,rhonchi or crepitation. No use of accessory muscles of respiration.  CARDIOVASCULAR: S1, S2 normal. No  rubs, or gallops. 3/6 systolic murmur present ABDOMEN: Soft, nontender, nondistended. Bowel sounds present. No organomegaly or mass.  EXTREMITIES: No pedal edema, cyanosis, or clubbing.  NEUROLOGIC: Cranial nerves II through XII are intact. Muscle strength 5/5 in all extremities. Sensation intact. Gait not checked.  PSYCHIATRIC: The patient is alert and oriented x 3.  SKIN: No obvious rash, lesion, or ulcer.     LABORATORY PANEL:   CBC  Recent Labs Lab 09/25/16 0419  WBC 5.0  HGB 12.3*  HCT 36.3*  PLT 191   ------------------------------------------------------------------------------------------------------------------  Chemistries   Recent Labs Lab 09/24/16 0258 09/25/16 0419  NA 133* 130*  K 4.5 4.4  CL 100* 98*  CO2 26 25  GLUCOSE 105* 150*  BUN 24* 28*  CREATININE 1.14 1.11  CALCIUM 9.4 9.1  AST 26  --  ALT 17  --   ALKPHOS 44  --   BILITOT 0.6  --    ------------------------------------------------------------------------------------------------------------------  Cardiac Enzymes  Recent Labs Lab  09/24/16 0258  TROPONINI <0.03   ------------------------------------------------------------------------------------------------------------------  RADIOLOGY:  Dg Chest 2 View  Result Date: 09/24/2016 CLINICAL DATA:  Shortness of breath and nonproductive cough EXAM: CHEST  2 VIEW COMPARISON:  Chest radiograph 09/08/2016 FINDINGS: Cardiomegaly and calcific aortic atherosclerosis are unchanged. No focal consolidation or pulmonary edema. No pneumothorax or pleural effusion. IMPRESSION: Cardiomegaly and calcific calcific aortic atherosclerosis. No acute airspace disease. Electronically Signed   By: Deatra RobinsonKevin  Herman M.D.   On: 09/24/2016 04:15    EKG:   Orders placed or performed during the hospital encounter of 09/24/16  . ED EKG  . ED EKG  . EKG 12-Lead  . EKG 12-Lead  . EKG 12-Lead  . EKG 12-Lead    ASSESSMENT AND PLAN:   Daniel BussingGeorge Dickson  is a [age] y.o. male with a known history of COPD not on home oxygen, CAD, hypertension, PTSD who stays at home and cares for his adult son with Jeral PinchDowns syndrome came in today with worsening cough and breathing difficulty.  #1 Acute on chronic COPD exacerbation- continue IV steroids, requiring O2 on exertion. -Will qualify for home oxygen. Supplemental o2 to maintain sats>90% - duonebs, inh - added levaquin for bronchitis symptoms, cough meds  #2 Dysphagia- appreciate swallow evaluation. Patient has significant reflux symptoms. -Continue Protonix-changed to twice a day at discharge. Appreciate speech therapy consult. Patient on dysphagia diet at this time  #3 BPH- chronic urinary retention- has chronic foley Cont home meds- flomax  #4 Hyperlipidemia- statin  #5 DVT Prophylaxis- lovenox  PT consult-needs home health at discharge     All the records are reviewed and case discussed with Care Management/Social Workerr. Management plans discussed with the patient, family and they are in agreement.  CODE STATUS:  Full code  TOTAL TIME  TAKING CARE OF THIS PATIENT: 38 minutes.   POSSIBLE D/C TOMORROW, DEPENDING ON CLINICAL CONDITION.   Enid BaasKALISETTI,Kacee Sukhu M.D on 09/25/2016 at 12:55 PM  Between 7am to 6pm - Pager - [phone]  After 6pm go to [email or web address] - password [email or web address]  [facility] [place] [facility]  Office  [phone]  CC: Primary care physician; Dortha KernBliss, Laura K, MD

## 2016-09-25 NOTE — Progress Notes (Signed)
Initial Nutrition Assessment  DOCUMENTATION CODES:   Severe malnutrition in context of chronic illness  INTERVENTION:  Provide Ensure Enlive po BID, each supplement provides 350 kcal and 20 grams of protein.   Provide multivitamin with minerals daily.  NUTRITION DIAGNOSIS:   Malnutrition (Severe) related to chronic illness (COPD, BPH, dysphagia) as evidenced by severe depletion of body fat, severe depletion of muscle mass.  GOAL:   Patient will meet greater than or equal to 90% of their needs  MONITOR:   PO intake, Supplement acceptance, Labs, Weight trends, I & O's  REASON FOR ASSESSMENT:   Malnutrition Screening Tool    ASSESSMENT:   83 year old male with PMHx of PTSD, HLD, CAD, COPD, anxiety, HTN, BPH, chronic urinary retention with chronic foley who cares for his adult son with Downs syndrome presented with worsening cough and difficulty breathing found to have acute exacerbation of COPD, dysphagia .    Patient known to this RD from several previous admissions. He reports his appetite remains poor with his coughing and wheezing. It has been poor for over one year. He reports he can usually finish 90% of one of his meals. Attempts to eat 3 meals per day. He has been coughing at meals. Reports he is eating well now that they are chopping his food (dysphagia 2). Patient reports there is not anyone at home to help him chop his meals and he would not be able to do it. He continues to drink Boost at home.   UBW was 140 lbs. He was 138.7 lbs on 10/19/2015 and lost approximately 13.9 lbs (10% body weight) over 10 months, which is not significant for time frame. Has since gained approximately 4 lbs over the past month.  Meal Completion: 75-100% since being placed on dysphagia 2 diet with thin liquids  Medications reviewed and include: vitamin D 1000 units daily, methylprednisolone 60 mg Q8hrs, senna.  Labs reviewed: Sodium 130, Chloride 98, BUN 28.   Nutrition-Focused physical  exam completed. Findings are severe fat depletion, severe muscle depletion, and no edema. Patient is edentulous. Has dentures in.  Diet Order:  DIET DYS 2 Room service appropriate? Yes with Assist; Fluid consistency: Thin Diet - low sodium heart healthy  Skin:  Reviewed, no issues (pt with mottled skin per RN assessment)  Last BM:  09/24/2016  Height:   Ht Readings from Last 1 Encounters:  09/24/16 5\' 6"  (1.676 m)    Weight:   Wt Readings from Last 1 Encounters:  09/24/16 128 lb 8 oz (58.3 kg)    Ideal Body Weight:  64.5 kg  BMI:  Body mass index is 20.74 kg/m.  Estimated Nutritional Needs:   Kcal:  1475-1700 (MSJ x 1.3-1.5)  Protein:  85-100 grams (1.5-1.7 grams/kg)  Fluid:  1.5 L/day (25 ml/kg)  EDUCATION NEEDS:   No education needs identified at this time  Helane RimaLeanne Lindsi Bayliss, MS, RD, LDN Pager: [phone] After Hours Pager: [phone]

## 2016-09-25 NOTE — Care Management CC44 (Signed)
Condition Code 44 Documentation Completed  Patient Details  Name: Calton GoldsGeorge Henry Colella MRN: [id number] Date of Birth: 04/01/1913   Condition Code 44 given:  Yes Patient signature on Condition Code 44 notice:  Yes Documentation of 2 MD's agreement:  Yes Code 44 added to claim:  Yes    Caren MacadamMichelle Kanye Depree, RN 09/25/2016, 11:46 AM

## 2016-09-25 NOTE — Care Management Note (Signed)
Case Management Note  Patient Details  Name: Calton GoldsGeorge Henry Salvato MRN: [id number] Date of Birth: 11/12/1912  Subjective/Objective:       Referral/resumption of care for HH-PT, RN, SW [facility]. Mr [name] is [id number] and cares for his Jeral PinchDowns Syndrome son at home. SW request to evaluate family's plan for care of son when Mr [name] is no longer able to care for him.              Action/Plan:   Expected Discharge Date:  09/25/16               Expected Discharge Plan:   09/25/16  In-House Referral:     Discharge planning Services     Post Acute Care Choice:    Choice offered to:     DME Arranged:   NA DME Agency:     HH Arranged:   PT, RN, SW [facility]  Status of Service:   Completed  If discussed at Long Length of Stay Meetings, dates discussed:    Additional Comments:  Johnice Riebe A, RN 09/25/2016, 9:46 AM

## 2016-09-26 DIAGNOSIS — Z7189 Other specified counseling: Secondary | ICD-10-CM

## 2016-09-26 DIAGNOSIS — R627 Adult failure to thrive: Secondary | ICD-10-CM

## 2016-09-26 DIAGNOSIS — Z515 Encounter for palliative care: Secondary | ICD-10-CM

## 2016-09-26 DIAGNOSIS — J441 Chronic obstructive pulmonary disease with (acute) exacerbation: Secondary | ICD-10-CM

## 2016-09-26 LAB — PROCALCITONIN

## 2016-09-26 MED ORDER — BUDESONIDE 0.25 MG/2ML IN SUSP
0.2500 mg | Freq: Two times a day (BID) | RESPIRATORY_TRACT | Status: DC
  Administered 2016-09-26 – 2016-09-28 (×5): 0.25 mg via RESPIRATORY_TRACT
  Filled 2016-09-26 (×4): qty 2

## 2016-09-26 MED ORDER — TIOTROPIUM BROMIDE MONOHYDRATE 18 MCG IN CAPS
18.0000 ug | ORAL_CAPSULE | Freq: Every morning | RESPIRATORY_TRACT | Status: DC
  Administered 2016-09-26 – 2016-09-28 (×3): 18 ug via RESPIRATORY_TRACT
  Filled 2016-09-26: qty 5

## 2016-09-26 MED ORDER — IPRATROPIUM-ALBUTEROL 0.5-2.5 (3) MG/3ML IN SOLN
3.0000 mL | RESPIRATORY_TRACT | Status: DC
  Administered 2016-09-26 – 2016-09-28 (×11): 3 mL via RESPIRATORY_TRACT
  Filled 2016-09-26 (×10): qty 3

## 2016-09-26 NOTE — Plan of Care (Signed)
Problem: Respiratory: Goal: Ability to maintain normal oxygenation or baseline will improve Outcome: Progressing Pt remains on 2 liters via [place]. Oxygen sats remain greater than 92% this shift.

## 2016-09-26 NOTE — Progress Notes (Signed)
[facility] - [place] at [facility]   PATIENT NAME: Daniel Dickson    MR#:  [id number]  DATE OF BIRTH:  04/21/1912  SUBJECTIVE:  CHIEF COMPLAINT:   Chief Complaint  Patient presents with  . Respiratory Distress   - Doing well at rest, however became hypoxic on exertion. Will need home oxygen.  had severe SOB and tachycardia as per nurse, on trying to walk a few steps  REVIEW OF SYSTEMS:  Review of Systems  Constitutional: Negative for chills, fever and malaise/fatigue.  HENT: Negative for congestion, ear discharge, hearing loss and nosebleeds.   Eyes: Negative for blurred vision and double vision.  Respiratory: Positive for cough, shortness of breath and wheezing.   Cardiovascular: Negative for chest pain, palpitations and leg swelling.  Gastrointestinal: Negative for abdominal pain, constipation, diarrhea, nausea and vomiting.  Genitourinary: Negative for dysuria.  Musculoskeletal: Negative for myalgias.  Neurological: Negative for dizziness, speech change, focal weakness, seizures and headaches.  Psychiatric/Behavioral: Negative for depression.    DRUG ALLERGIES:   Allergies  Allergen Reactions  . Cortisone Other (See Comments)    Other reaction(s): Other (See Comments) GI Upset Unknown reaction  . Dye Fdc Red [Red Dye] Other (See Comments)    Unknown reaction.  . Iodine (Kelp)  [Iodine]     Other reaction(s): Other (See Comments) GI Upset  . Peanut Oil     Other reaction(s): Unknown  . Peanut-Containing Drug Products     Other reaction(s): Unknown  . Penicillins Rash and Other (See Comments)    GI Upset patient not sure of reaction Has patient had a PCN reaction causing immediate rash, facial/tongue/throat swelling, SOB or lightheadedness with hypotension: Unknown Has patient had a PCN reaction causing severe rash involving mucus membranes or skin necrosis: Unknown Has patient had a PCN reaction that required hospitalization: Unknown Has  patient had a PCN reaction occurring within the last 10 years: Unknown If all of the above answers are "NO", then may proceed with Cephalosporin use.     VITALS:  Blood pressure 128/71, pulse 95, temperature 97.8 F (36.6 C), resp. rate 18, height 5\' 6"  (1.676 m), weight 58.3 kg (128 lb 8 oz), SpO2 98 %.  PHYSICAL EXAMINATION:  Physical Exam  GENERAL:  83 y.o.-year-old elderly patient lying in the bed with no acute distress.  EYES: Pupils equal, round, reactive to light and accommodation. No scleral icterus. Extraocular muscles intact.  HEENT: Head atraumatic, normocephalic. Oropharynx and nasopharynx clear.  NECK:  Supple, no jugular venous distention. No thyroid enlargement, no tenderness.  LUNGS: scant breath sounds bilaterally, minimal scattered wheezing, no rales,rhonchi or crepitation. No use of accessory muscles of respiration.  CARDIOVASCULAR: S1, S2 normal. No  rubs, or gallops. 3/6 systolic murmur present ABDOMEN: Soft, nontender, nondistended. Bowel sounds present. No organomegaly or mass.  EXTREMITIES: No pedal edema, cyanosis, or clubbing.  NEUROLOGIC: Cranial nerves II through XII are intact. Muscle strength 4/5 in all extremities. Sensation intact. Gait not checked.  PSYCHIATRIC: The patient is alert and oriented x 3.  SKIN: No obvious rash, lesion, or ulcer.     LABORATORY PANEL:   CBC  Recent Labs Lab 09/25/16 0419  WBC 5.0  HGB 12.3*  HCT 36.3*  PLT 191   ------------------------------------------------------------------------------------------------------------------  Chemistries   Recent Labs Lab 09/24/16 0258 09/25/16 0419  NA 133* 130*  K 4.5 4.4  CL 100* 98*  CO2 26 25  GLUCOSE 105* 150*  BUN 24* 28*  CREATININE 1.14 1.11  CALCIUM  9.4 9.1  AST 26  --   ALT 17  --   ALKPHOS 44  --   BILITOT 0.6  --    ------------------------------------------------------------------------------------------------------------------  Cardiac  Enzymes  Recent Labs Lab 09/24/16 0258  TROPONINI <0.03   ------------------------------------------------------------------------------------------------------------------  RADIOLOGY:  No results found.  EKG:   Orders placed or performed during the hospital encounter of 09/24/16  . ED EKG  . ED EKG  . EKG 12-Lead  . EKG 12-Lead  . EKG 12-Lead  . EKG 12-Lead    ASSESSMENT AND PLAN:   Daniel Dickson  is a 83 y.o. male with a known history of COPD not on home oxygen, CAD, hypertension, PTSD who stays at home and cares for his adult son with Daniel Dickson syndrome came in today with worsening cough and breathing difficulty.  #1 Acute on chronic COPD exacerbation- continue IV steroids, requiring O2 on exertion. - qualify for home oxygen. Supplemental o2 to maintain sats>90% - duonebs, inh - added levaquin for bronchitis symptoms, cough meds - procalcitonin is low. - added dbudesonide nebs also.  #2 Dysphagia- appreciate swallow evaluation. Patient has significant reflux symptoms. -Continue Protonix-changed to twice a day at discharge. Appreciate speech therapy consult. Patient on dysphagia diet at this time  #3 BPH- chronic urinary retention- has chronic foley Cont home meds- flomax  #4 Hyperlipidemia- statin  #5 DVT Prophylaxis- lovenox  PT consult-needs home health at discharge As not improving much- called palliative care consult.   All the records are reviewed and case discussed with Care Management/Social Workerr. Management plans discussed with the patient, family and they are in agreement.  CODE STATUS:  Full code  TOTAL TIME TAKING CARE OF THIS PATIENT: 35 minutes.   POSSIBLE D/C TOMORROW, DEPENDING ON CLINICAL CONDITION.   Daniel Dickson M.D on 09/26/2016 at 4:47 PM  Between 7am to 6pm - Pager - [phone]  After 6pm go to [email or web address] - password [email or web address]  [facility] [place] [facility]  Office  [phone]  CC: Primary care physician;  Daniel Kern, MD

## 2016-09-26 NOTE — Plan of Care (Signed)
Problem: Respiratory: Goal: Ability to achieve and maintain a regular respiratory rate will improve Outcome: Progressing Pt O2 sats dropped to the low 80's with tachycardia during ambulation, see saturation qualification progress note.  O2 sats in the high 90's on 2L per [place] at rest.  Plan to discharge home with O2.

## 2016-09-26 NOTE — Care Management (Addendum)
Message left for patient's daughter Elease Hashimotoatricia [phone]. I have [facility]; forms not required at this time as patient is in OBS status. RNCM received call from from patient's daughter Elease Hashimotoatricia [phone]. He is not on home O2.  She states the last time he was at [facility] he stayed in the bed for 3 days and she "literally had to carry him in the home".  She states that her father/patient is "so mean no other family member will put up with him".  She feels that she has full responsibility of patient. She wants him to go to long term care but "it's always a big fight". Chanetta MarshallJimmy is the son that has down syndrome which is very self- sufficient per Elease HashimotoPatricia.  "He is wearing Jimmy down".  He will not allow her to hire a Comptrollersitter. He has told Elease Hashimotoatricia (per Elease HashimotoPatricia) that "you are just gonna have to take care of me".  She anticipates patient's discharge to home today. Elease Hashimotoatricia will provide transportation to home. He is very "needy" and her mother did everything for him. "[employer]" comes in 3 days a week to mop floor and give  Him a bath. RN to assess for home O2.

## 2016-09-26 NOTE — Progress Notes (Addendum)
SATURATION QUALIFICATIONS: (This note is used to comply with regulatory documentation for home oxygen)  Patient Saturations on Room Air at Rest = 92%  Patient Saturations on Room Air while Ambulating = 81%-83%  Patient Saturations on 3 Liters of oxygen while Ambulating = 85%  Please briefly explain why patient needs home oxygen: Pt walked 3-4 steps, O2 sats on RA dropped to 81%,HR 160's-200, pt felt very weak. 3L O2 applied O2 sats up to 85%. Pt walked back to the bed, within seconds O2 sats up to 92, HR 110. Dr Elisabeth PigeonVachhani is aware.

## 2016-09-26 NOTE — Care Management (Addendum)
Barbara CowerJason with [facility] notified of patient's O2 saturations; home O2 referral made with [facility]. Patient will require home supplemental Oxygen as he has been treated with IV Solumedrol and DuoNebs without improvement.

## 2016-09-26 NOTE — Consult Note (Signed)
Consultation Note Date: 09/26/2016   Patient Name: Daniel Dickson  DOB: 1912-11-29  MRN: [id number]  Age / Sex: 83 y.o., male  PCP: [name], MD Referring Physician: Altamese Dilling, *  Reason for Consultation: Establishing goals of care and Psychosocial/spiritual support  HPI/Patient Profile:   83 y.o. male  admitted on 09/24/2016 with COPD not on home oxygen, CAD, hypertension,  who stays at home and lives with  [name] syndrome came in today with worsening cough and breathing difficulty.  Son works outside the home in Engineer, drilling.   He has an indwelling foley, and continued physical and functional decline over the past year. Multiple rehospitalizations and ER visits for similar episodes over the past 6 months.   In ER CXR is clear, but  hypoxic in ED and requiring several nebs for his wheezing,   Admitted for stabilization and treatment   Clinical Assessment and Goals of Care:   This NP Lorinda Creed reviewed medical records, received report from team, assessed the patient and then meet at the patient's bedside  to discuss diagnosis, prognosis, GOC, EOL wishes disposition and options.  A detailed discussion was had today regarding advanced directives.  Concepts specific to code status, artifical feeding and hydration, continued IV antibiotics and rehospitalization was had.  The difference between a aggressive medical intervention path  and a palliative comfort care path for this patient at this time was had.  Values and goals of care important to patient and family were attempted to be elicited.   Concept of Hospice and Palliative Care were discussed  Natural trajectory and expectations at EOL were discussed.  Questions and concerns addressed.   Family encouraged to call with questions or concerns.  PMT will continue to support holistically.   PATIENT is his  own decision maker    SUMMARY OF RECOMMENDATIONS    Code Status/Advance Care Planning:  DNR-documented today  Strongly recommended DNR/DNI status knowing poor outcomes in similar pateitnts  Palliative Prophylaxis:   Bowel Regimen, Delirium Protocol and Frequent Pain Assessment  Additional Recommendations (Limitations, Scope, Preferences):  Full Scope Treatment  Psycho-social/Spiritual:   After speaking with both the patient and his daughter there are clear discrepancies and opinions of situation at home.  Son does have Downs syndrome but is self care for activities of daily living and works in a vocational program outside the home.  Daughter/Daniel Dickson is the guardian for her brother.  Daughter is main support person for her father because "he has driven everyone away", patient agrees with this but thinks his daughter should do more.  Daughter expresses frustration with the situation, "he is unappreciative and I am  a prisoner to him"   Daughter does not believe that he could survive at home without her help.  The patient has unrealistic expectations of what in home care looks like, "there should be something available for me", I explained out of pocket caregivers   Will ask CM to provide list of home care agencies.  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  He also tells me "I cannot do rehab either"    Desire for further Chaplaincy support:yes  Additional Recommendations: Education on Hospice  Prognosis:   Unable to determine  Discharge Planning:  This is a difficult situation, patient has capacity to make his own decision to return home  even if there appears to be other viable others.   I discussed this with Mr Henk and that choices have consequences   Recommend Palliative Care services in the home on  discharge   To Be Determined      Primary Diagnoses: Present on Admission: . COPD exacerbation (HCC) . COPD (chronic obstructive pulmonary disease) (HCC)   I have reviewed the medical record, interviewed the patient and family, and examined the patient. The following aspects are pertinent.  Past Medical History:  Diagnosis Date  . Anxiety   . COPD (chronic obstructive pulmonary disease) (HCC)   . Coronary artery disease    a. 2012 s/p MI/cardiac arrest--> PCI mRCA.  Marland Kitchen Hypertension   . Hypertensive heart disease   . Mixed hyperlipidemia   . PTSD (post-traumatic stress disorder)    with headaches   Social History   Social History  . Marital status: Widowed    Spouse name: N/A  . Number of children: N/A  . Years of education: N/A   Occupational History  . retired    Social History Main Topics  . Smoking status: Former Smoker    Packs/day: 3.00    Years: 40.00    Types: Cigarettes  . Smokeless tobacco: Never Used     Comment: quit 62 years  . Alcohol use Yes     Comment: wine at night.   . Drug use: No  . Sexual activity: Not Asked   Other Topics Concern  . None   Social History Narrative  . None   Family History  Problem Relation Age of Onset  . COPD Father   . Bladder Cancer Neg Hx   . Kidney cancer Neg Hx   . Prostate cancer Neg Hx    Scheduled Meds: . aspirin EC  81 mg Oral Daily  . benzonatate  100 mg Oral TID  . budesonide (PULMICORT) nebulizer solution  0.25 mg Nebulization BID  . chlorpheniramine-HYDROcodone  5 mL Oral Q12H  . cholecalciferol  1,000 Units Oral Daily  . enoxaparin (LOVENOX) injection  30 mg Subcutaneous Q24H  . feeding supplement (ENSURE ENLIVE)  237 mL Oral BID BM  . ipratropium-albuterol  3 mL Nebulization Q4H  . isosorbide mononitrate  30 mg Oral Daily  . levofloxacin  250 mg Oral Daily  . loratadine  10 mg Oral Daily  . Melatonin  10 mg Oral QHS  . methylPREDNISolone (SOLU-MEDROL) injection  60 mg Intravenous Q8H  .  multivitamin with minerals  1 tablet Oral Daily  . pantoprazole  40 mg Oral Daily  . pravastatin  40 mg Oral q1800  . senna-docusate  2 tablet Oral BID  . tamsulosin  0.4 mg Oral Daily  . tiotropium  18 mcg Inhalation q morning - 10a   Continuous Infusions: PRN Meds:.acetaminophen **OR** acetaminophen, magnesium hydroxide, nitroGLYCERIN, ondansetron **OR** ondansetron (ZOFRAN) IV, oxybutynin Medications Prior to Admission:  Prior to Admission medications   Medication Sig Start Date End Date Taking? Authorizing  Provider  acetaminophen (TYLENOL) 500 MG tablet Take 500 mg by mouth daily as needed for mild pain or moderate pain.    Yes [provider]  albuterol (PROAIR HFA) 108 (90 BASE) MCG/ACT inhaler Inhale 2 puffs into the lungs every 6 (six) hours as needed. For wheezing.   Yes [provider]  albuterol (PROVENTIL) (2.5 MG/3ML) 0.083% nebulizer solution Take 3 mLs (2.5 mg total) by nebulization every 8 (eight) hours as needed for wheezing or shortness of breath. 10/09/15  Yes Mody, Daniel Pesa, MD  antiseptic oral rinse (BIOTENE) LIQD 15 mLs by Mouth Rinse route as needed for dry mouth.   Yes [provider]  aspirin EC 81 MG tablet Take 81 mg by mouth daily.   Yes [provider]  Cholecalciferol (VITAMIN D3) 1000 UNITS CAPS Take 1,000 Units by mouth daily.   Yes [provider]  isosorbide mononitrate (IMDUR) 30 MG 24 hr tablet Take 1 tablet (30 mg total) by mouth daily. 11/02/15  Yes Katharina Caper, MD  loratadine (CLARITIN) 10 MG tablet Take 10 mg by mouth daily.   Yes [provider]  magnesium hydroxide (MILK OF MAGNESIA) 400 MG/5ML suspension Take 30 mLs by mouth 2 (two) times daily as needed for mild constipation or moderate constipation. 11/02/15  Yes Katharina Caper, MD  Melatonin 5 MG CAPS Take 10 mg by mouth at bedtime.   Yes [provider]  nitroGLYCERIN (NITROSTAT) 0.4 MG SL tablet Place 0.4 mg under the tongue every 5  (five) minutes as needed for chest pain. Reported on 10/19/2015   Yes [provider]  oxybutynin (DITROPAN-XL) 5 MG 24 hr tablet Take as needed for bladder spasms up to three times a day 07/27/16  Yes McGowan, Carollee Herter A, PA-C  phenazopyridine (PYRIDIUM) 100 MG tablet Take 100 mg by mouth 3 (three) times daily with meals.   Yes [provider]  pravastatin (PRAVACHOL) 80 MG tablet Take 40 mg by mouth daily.   Yes [provider]  senna-docusate (SENOKOT-S) 8.6-50 MG tablet Take 2 tablets by mouth 2 (two) times daily. 09/08/16  Yes Sharman Cheek, MD  tamsulosin (FLOMAX) 0.4 MG CAPS capsule Take 0.4 mg by mouth daily.    Yes [provider]  tiotropium (SPIRIVA) 18 MCG inhalation capsule Place 1 capsule (18 mcg total) into inhaler and inhale every evening. 10/09/15 10/08/16 Yes Mody, Daniel Pesa, MD  benzonatate (TESSALON PERLES) 100 MG capsule Take 1 capsule (100 mg total) by mouth every 6 (six) hours as needed for cough. Patient not taking: Reported on 09/08/2016 04/06/16 04/06/17  Phineas Semen, MD  benzonatate (TESSALON) 100 MG capsule Take 1 capsule (100 mg total) by mouth 3 (three) times daily. 09/25/16   Enid Baas, MD  cephALEXin (KEFLEX) 500 MG capsule Take 1 capsule (500 mg total) by mouth 2 (two) times daily. Patient not taking: Reported on 09/24/2016 09/08/16   Sharman Cheek, MD  chlorpheniramine-HYDROcodone (TUSSIONEX) 10-8 MG/5ML SUER Take 5 mLs by mouth every 12 (twelve) hours. 09/25/16   Enid Baas, MD  feeding supplement (BOOST / RESOURCE BREEZE) LIQD Take 1 Container by mouth 3 (three) times daily between meals. 11/02/15   Katharina Caper, MD  levofloxacin (LEVAQUIN) 250 MG tablet Take 1 tablet (250 mg total) by mouth daily. X 5 more days 09/26/16   Enid Baas, MD  pantoprazole (PROTONIX) 40 MG tablet Take 1 tablet (40 mg total) by mouth 2 (two) times daily before a meal. 09/25/16   Enid Baas, MD  predni[facility]  UNI-PAK 21 TAB) 10 MG (21) TBPK tablet 6 tabs PO x 1 day 5 tabs PO x 1 day 4 tabs PO x 1 day 3 tabs PO x 1 day 2 tabs PO x 1 day 1 tab PO x 1 day and stop 09/25/16   Enid BaasKalisetti, Radhika, MD   Allergies  Allergen Reactions  . Cortisone Other (See Comments)    Other reaction(s): Other (See Comments) GI Upset Unknown reaction  . Dye Fdc Red [Red Dye] Other (See Comments)    Unknown reaction.  . Iodine (Kelp)  [Iodine]     Other reaction(s): Other (See Comments) GI Upset  . Peanut Oil     Other reaction(s): Unknown  . Peanut-Containing Drug Products     Other reaction(s): Unknown  . Penicillins Rash and Other (See Comments)    GI Upset patient not sure of reaction Has patient had a PCN reaction causing immediate rash, facial/tongue/throat swelling, SOB or lightheadedness with hypotension: Unknown Has patient had a PCN reaction causing severe rash involving mucus membranes or skin necrosis: Unknown Has patient had a PCN reaction that required hospitalization: Unknown Has patient had a PCN reaction occurring within the last 10 years: Unknown If all of the above answers are "NO", then may proceed with Cephalosporin use.    Review of Systems  Neurological: Positive for weakness.    Physical Exam  Constitutional: Nasal cannula in place.  -frail, elderly male  Cardiovascular: Normal rate and normal heart sounds.   Pulmonary/Chest: Effort normal and breath sounds normal.  Neurological: He is alert.  Skin: Skin is warm and dry.    Vital Signs: BP 123/64 (BP Location: Right Arm)   Pulse 90   Temp 97.5 F (36.4 C) (Oral)   Resp 20   Ht 5\' 6"  (1.676 m)   Wt 58.3 kg (128 lb 8 oz)   SpO2 97%   BMI 20.74 kg/m  Pain Assessment: 0-10   Pain Score: 0-No pain   SpO2: SpO2: 97 % O2 Device:SpO2: 97 % O2 Flow Rate: .O2 Flow Rate (L/min): 2 L/min  IO: Intake/output summary:  Intake/Output Summary (Last 24 hours) at 09/26/16 1231 Last data filed at 09/26/16 1018  Gross per 24  hour  Intake              720 ml  Output              600 ml  Net              120 ml    LBM: Last BM Date: 09/24/16 Baseline Weight: Weight: 56.2 kg (124 lb) Most recent weight: Weight: 58.3 kg (128 lb 8 oz)     Palliative Assessment/Data: 30% at best   Discussed with Dr Elisabeth PigeonVachhani  Time In: 1315 Time Out: 1430 Time Total: 75 min Greater than 50%  of this time was spent counseling and coordinating care related to the above assessment and plan.  Signed by: Lorinda CreedLARACH, Kaylon Laroche, NP   Please contact Palliative Medicine Team phone at [phone] for questions and concerns.  For individual provider: See Loretha StaplerAmion

## 2016-09-27 DIAGNOSIS — Z515 Encounter for palliative care: Secondary | ICD-10-CM

## 2016-09-27 DIAGNOSIS — Z7189 Other specified counseling: Secondary | ICD-10-CM

## 2016-09-27 DIAGNOSIS — J441 Chronic obstructive pulmonary disease with (acute) exacerbation: Secondary | ICD-10-CM | POA: Diagnosis not present

## 2016-09-27 DIAGNOSIS — R627 Adult failure to thrive: Secondary | ICD-10-CM

## 2016-09-27 MED ORDER — POLYETHYLENE GLYCOL 3350 17 G PO PACK
17.0000 g | PACK | Freq: Every day | ORAL | Status: DC
  Administered 2016-09-27 – 2016-09-28 (×2): 17 g via ORAL
  Filled 2016-09-27 (×3): qty 1

## 2016-09-27 NOTE — Progress Notes (Signed)
[employer] - [place] [facility]   PATIENT NAME: Daniel BussingGeorge Dickson    MR#:  [id number]  DATE OF BIRTH:  03/04/1913  SUBJECTIVE:  CHIEF COMPLAINT:   Chief Complaint  Patient presents with  . Respiratory Distress   - Doing well at rest,  Will need home oxygen.  had severe SOB and tachycardia as per nurse, on trying to walk a few steps, thogh much better than yesterday.  REVIEW OF SYSTEMS:  Review of Systems  Constitutional: Negative for chills, fever and malaise/fatigue.  HENT: Negative for congestion, ear discharge, hearing loss and nosebleeds.   Eyes: Negative for blurred vision and double vision.  Respiratory: Positive for cough, shortness of breath and wheezing.   Cardiovascular: Negative for chest pain, palpitations and leg swelling.  Gastrointestinal: Negative for abdominal pain, constipation, diarrhea, nausea and vomiting.  Genitourinary: Negative for dysuria.  Musculoskeletal: Negative for myalgias.  Neurological: Negative for dizziness, speech change, focal weakness, seizures and headaches.  Psychiatric/Behavioral: Negative for depression.    DRUG ALLERGIES:   Allergies  Allergen Reactions  . Cortisone Other (See Comments)    Other reaction(s): Other (See Comments) GI Upset Unknown reaction  . Dye Fdc Red [Red Dye] Other (See Comments)    Unknown reaction.  . Iodine (Kelp)  [Iodine]     Other reaction(s): Other (See Comments) GI Upset  . Peanut Oil     Other reaction(s): Unknown  . Peanut-Containing Drug Products     Other reaction(s): Unknown  . Penicillins Rash and Other (See Comments)    GI Upset patient not sure of reaction Has patient had a PCN reaction causing immediate rash, facial/tongue/throat swelling, SOB or lightheadedness with hypotension: Unknown Has patient had a PCN reaction causing severe rash involving mucus membranes or skin necrosis: Unknown Has patient had a PCN reaction that required hospitalization: Unknown Has  patient had a PCN reaction occurring within the last 10 years: Unknown If all of the above answers are "NO", then may proceed with Cephalosporin use.     VITALS:  Blood pressure (!) 143/74, pulse 99, temperature 98.6 F (37 C), resp. rate 18, height 5\' 6"  (1.676 m), weight 58.3 kg (128 lb 8 oz), SpO2 97 %.  PHYSICAL EXAMINATION:  Physical Exam  GENERAL:  [age] y.o.-year-old elderly patient lying in the bed with no acute distress.  EYES: Pupils equal, round, reactive to light and accommodation. No scleral icterus. Extraocular muscles intact.  HEENT: Head atraumatic, normocephalic. Oropharynx and nasopharynx clear.  NECK:  Supple, no jugular venous distention. No thyroid enlargement, no tenderness.  LUNGS: scant breath sounds bilaterally, minimal scattered wheezing, no rales,rhonchi or crepitation. No use of accessory muscles of respiration.  CARDIOVASCULAR: S1, S2 normal. No  rubs, or gallops. 3/6 systolic murmur present ABDOMEN: Soft, nontender, nondistended. Bowel sounds present. No organomegaly or mass.  EXTREMITIES: No pedal edema, cyanosis, or clubbing.  NEUROLOGIC: Cranial nerves II through XII are intact. Muscle strength 4/5 in all extremities. Sensation intact. Gait not checked.  PSYCHIATRIC: The patient is alert and oriented x 3.  SKIN: No obvious rash, lesion, or ulcer.     LABORATORY PANEL:   CBC  Recent Labs Lab 09/25/16 0419  WBC 5.0  HGB 12.3*  HCT 36.3*  PLT 191   ------------------------------------------------------------------------------------------------------------------  Chemistries   Recent Labs Lab 09/24/16 0258 09/25/16 0419  NA 133* 130*  K 4.5 4.4  CL 100* 98*  CO2 26 25  GLUCOSE 105* 150*  BUN 24* 28*  CREATININE 1.14 1.11  CALCIUM 9.4 9.1  AST 26  --   ALT 17  --   ALKPHOS 44  --   BILITOT 0.6  --    ------------------------------------------------------------------------------------------------------------------  Cardiac  Enzymes  Recent Labs Lab 09/24/16 0258  TROPONINI <0.03   ------------------------------------------------------------------------------------------------------------------  RADIOLOGY:  No results found.  EKG:   Orders placed or performed during the hospital encounter of 09/24/16  . ED EKG  . ED EKG  . EKG 12-Lead  . EKG 12-Lead  . EKG 12-Lead  . EKG 12-Lead    ASSESSMENT AND PLAN:   Daniel Dickson  is a 83 y.o. male with a known history of COPD not on home oxygen, CAD, hypertension, PTSD who stays at home and cares for [name] syndrome came in today with worsening cough and breathing difficulty.  #1 Acute on chronic COPD exacerbation- continue IV steroids, requiring O2 on exertion. - qualified for home oxygen. Supplemental o2 to maintain sats>90% - duonebs, inh - added levaquin for bronchitis symptoms, cough meds - procalcitonin is low. Stop levaquin. - added dbudesonide nebs also. - Still today after walking 15 steps with oxygen he felt very tired and started wheezing so does not seem to be ready for discharge today. But this is much better than yesterday when he started having hypoxia and tachycardia just by 3-4 steps of walking.  #2 Dysphagia- appreciate swallow evaluation. Patient has significant reflux symptoms. -Continue Protonix-changed to twice a day at discharge. Appreciate speech therapy consult. Patient on dysphagia diet at this time  #3 BPH- chronic urinary retention- has chronic foley Cont home meds- flomax  #4 Hyperlipidemia- statin  #5 DVT Prophylaxis- lovenox  PT consult-needs home health at discharge As not improving much- called palliative care consult.   All the records are reviewed and case discussed with Care Management/Social Workerr. Management plans discussed with the patient, family and they are in agreement.  CODE STATUS:  DNR  TOTAL TIME TAKING CARE OF THIS PATIENT: 35 minutes.   POSSIBLE D/C TOMORROW,  DEPENDING ON CLINICAL CONDITION.   Altamese Dilling M.D on 09/27/2016 at 2:53 PM  Between 7am to 6pm - Pager - [phone]  After 6pm go to [email or web address] - password [email or web address]  [facility]  Office  [phone]  CC: Primary care physician; [name], MD

## 2016-09-27 NOTE — Plan of Care (Signed)
Problem: Education: Goal: Knowledge of [facility] Education information/materials will improve Outcome: Progressing VSS, free of falls during shift.  Denies pain, no needs overnight.  Chronic foley in place, no issues.  Bed in low position, bed alarm on.  Call bell within reach, WCTM.

## 2016-09-27 NOTE — Progress Notes (Signed)
Ambulate with pt in the room. Pt walked about 6915feet on 2L per [place]. O2 sats 93% on 2L per [place], HR up to 120's. Pt felt very tired and wheezing. Pt feels better once back to the bed.

## 2016-09-28 LAB — PROCALCITONIN

## 2016-09-28 MED ORDER — ALBUTEROL SULFATE (2.5 MG/3ML) 0.083% IN NEBU: 2.5000 mg | INHALATION_SOLUTION | RESPIRATORY_TRACT | 12 refills | Status: AC | PRN

## 2016-09-28 MED ORDER — LEVOFLOXACIN 250 MG PO TABS: 250.0000 mg | ORAL_TABLET | Freq: Every day | ORAL | 0 refills | Status: DC

## 2016-09-28 NOTE — Plan of Care (Signed)
Problem: Education: Goal: Knowledge of [facility] Education information/materials will improve Outcome: Progressing VSS, free of falls.  BM x1 during shift.  Denies pain.  No needs overnight.  Bed in low position, bed alarm on.  Call bell within reach, WCTM.

## 2016-09-28 NOTE — Discharge Instructions (Signed)
Resume diet and activity as before ° ° °

## 2016-09-28 NOTE — Progress Notes (Signed)
Patient ID: Daniel Dickson, male   DOB: 05/12/1912, [age] y.o.   MRN: [id number]  This NP visited patient at the bedside as a follow up to yesterdays's  GOCs meeting.  Daniel Dickson continues to be adamant about returning home.  He is on the phone calling various contacts putting "things in order" for he and Daniel Dickson.    He refuses to let me contact his daughter again, "I'm done with her"  I discussed possibility of home palliative vs home hospice services.  He is open to "whatever help I can get".  I will recommend PCS and he will transition  to hospice when appropriate.   I again discussed with Daniel Dickson the likelihood that he will continue to decline physically and functionaly 2/2 to his advanced age and multiple co morbidities and the importance of advanced care planning ensuring decisions are within the context of the patient's values and GOCs.  Questions and concerns addressed  Time in   0930        Time out    1000   Greater than 50% of the time was spent in counseling and coordination of care  Daniel CreedMary Toula Miyasaki NP  Palliative Medicine Team Team Phone # [phone] Pager [phone]

## 2016-09-28 NOTE — Progress Notes (Signed)
SATURATION QUALIFICATIONS: (This note is used to comply with regulatory documentation for home oxygen)  Patient Saturations on Room Air at Rest = 87%  Patient Saturations on Room Air while Ambulating = 85%  Patient Saturations on 2 Liters of oxygen while Ambulating = 94%  Please briefly explain why patient needs home oxygen: COPD  

## 2016-09-28 NOTE — Progress Notes (Signed)
Discharge paperwork reviewed with patient and patient's daughter who handles patients home medication & who will transport home. Follow-up apt with PCP made. Oxygen at bedside provided by [facility]. Chronic foley remains in place. Prescriptions attached to packet. Patient is stable and ready for discharge.

## 2016-09-28 NOTE — Progress Notes (Signed)
New referral for Home Palliative to follow after discharge [facility]. Plan is for discharge home today. Referral made aware. Thank you. Dayna BarkerKaren Robertson RN, BSN, [facility], [facility] [phone] c

## 2016-09-28 NOTE — Care Management (Signed)
Discharge to home today per Dr. Elpidio AnisSudini. Home oxygen qualifications obtained. Update to Feliberto GottronJason Hinton, [facility] representative updated. Will arrange for home oxygen.  Will fax Home Health referrals to [facility]. Family will transport Gwenette GreetBrenda S Modean Mccullum RN MSN CCM Care Management [phone]

## 2016-09-29 ENCOUNTER — Emergency Department: Payer: Medicare PPO

## 2016-09-29 ENCOUNTER — Encounter: Payer: Self-pay | Admitting: *Deleted

## 2016-09-29 ENCOUNTER — Emergency Department
Admission: EM | Admit: 2016-09-29 | Discharge: 2016-09-29 | Disposition: A | Discharge status: Home / Self Care | Payer: Medicare PPO | Attending: Emergency Medicine | Admitting: Emergency Medicine

## 2016-09-29 DIAGNOSIS — Z955 Presence of coronary angioplasty implant and graft: Secondary | ICD-10-CM | POA: Insufficient documentation

## 2016-09-29 DIAGNOSIS — I119 Hypertensive heart disease without heart failure: Secondary | ICD-10-CM | POA: Insufficient documentation

## 2016-09-29 DIAGNOSIS — S8011XA Contusion of right lower leg, initial encounter: Secondary | ICD-10-CM | POA: Insufficient documentation

## 2016-09-29 DIAGNOSIS — W010XXA Fall on same level from slipping, tripping and stumbling without subsequent striking against object, initial encounter: Secondary | ICD-10-CM | POA: Insufficient documentation

## 2016-09-29 DIAGNOSIS — S41112A Laceration without foreign body of left upper arm, initial encounter: Secondary | ICD-10-CM | POA: Diagnosis not present

## 2016-09-29 DIAGNOSIS — J449 Chronic obstructive pulmonary disease, unspecified: Secondary | ICD-10-CM | POA: Insufficient documentation

## 2016-09-29 DIAGNOSIS — I2510 Atherosclerotic heart disease of native coronary artery without angina pectoris: Secondary | ICD-10-CM | POA: Diagnosis not present

## 2016-09-29 DIAGNOSIS — Z91010 Allergy to peanuts: Secondary | ICD-10-CM | POA: Diagnosis not present

## 2016-09-29 DIAGNOSIS — Y999 Unspecified external cause status: Secondary | ICD-10-CM | POA: Diagnosis not present

## 2016-09-29 DIAGNOSIS — Z87891 Personal history of nicotine dependence: Secondary | ICD-10-CM | POA: Insufficient documentation

## 2016-09-29 DIAGNOSIS — T07XXXA Unspecified multiple injuries, initial encounter: Secondary | ICD-10-CM

## 2016-09-29 DIAGNOSIS — Z7982 Long term (current) use of aspirin: Secondary | ICD-10-CM | POA: Diagnosis not present

## 2016-09-29 DIAGNOSIS — Y929 Unspecified place or not applicable: Secondary | ICD-10-CM | POA: Insufficient documentation

## 2016-09-29 DIAGNOSIS — Y9389 Activity, other specified: Secondary | ICD-10-CM | POA: Diagnosis not present

## 2016-09-29 DIAGNOSIS — Z7901 Long term (current) use of anticoagulants: Secondary | ICD-10-CM | POA: Diagnosis not present

## 2016-09-29 DIAGNOSIS — S59912A Unspecified injury of left forearm, initial encounter: Secondary | ICD-10-CM | POA: Diagnosis present

## 2016-09-29 MED ORDER — OXYCODONE-ACETAMINOPHEN 5-325 MG PO TABS
1.0000 | ORAL_TABLET | Freq: Once | ORAL | Status: AC
  Administered 2016-09-29: 1 via ORAL

## 2016-09-29 MED ORDER — FLEET ENEMA 7-19 GM/118ML RE ENEM
1.0000 | ENEMA | Freq: Once | RECTAL | Status: AC
  Administered 2016-09-29: 1 via RECTAL

## 2016-09-29 MED ORDER — OXYCODONE-ACETAMINOPHEN 5-325 MG PO TABS
ORAL_TABLET | ORAL | Status: AC
  Filled 2016-09-29: qty 1

## 2016-09-29 MED ORDER — POLYETHYLENE GLYCOL 3350 17 G PO PACK
17.0000 g | PACK | Freq: Every day | ORAL | Status: DC
  Administered 2016-09-29: 17 g via ORAL
  Filled 2016-09-29: qty 1

## 2016-09-29 NOTE — ED Triage Notes (Signed)
Pt brought in by EMS, pt was walking to the bathroom, pt tripped, pt has large skin tear to left forearm, left pinky finger, and hematoma to right ankle, pt alert and oriented

## 2016-09-29 NOTE — ED Notes (Signed)
Steri strips applied to left arm laceration with dressing per MD

## 2016-09-29 NOTE — ED Notes (Signed)
Message left for daughter ,pt will be discharged home

## 2016-09-29 NOTE — ED Provider Notes (Signed)
[facility] Emergency Department Provider Note   First MD Initiated Contact with Patient 09/29/16 0138     (approximate)  I have reviewed the triage vital signs and the nursing notes.   HISTORY  Chief Complaint Fall and Laceration    HPI Daniel Dickson is a 83 y.o. male with below list of chronic medical conditions presents to the emergency department with history of "trip and fall" tonight while in route to the bathroom. Patient admits to left proximal arm and right lower leg pain. In addition patient does admit to hitting his head however no loss of consciousness. Patient states his current pain score 7 out of 10.   Past Medical History:  Diagnosis Date  . Anxiety   . COPD (chronic obstructive pulmonary disease) (HCC)   . Coronary artery disease    a. 2012 s/p MI/cardiac arrest--> PCI mRCA.  Marland Kitchen Hypertension   . Hypertensive heart disease   . Mixed hyperlipidemia   . PTSD (post-traumatic stress disorder)    with headaches    Patient Active Problem List   Diagnosis Date Noted  . DNR (do not resuscitate) discussion   . Palliative care by specialist   . Adult failure to thrive   . Gross hematuria   . Acute respiratory failure (HCC) 02/18/2016  . Urinary retention 11/10/2015  . Hyperkalemia 11/02/2015  . Hypotension 11/02/2015  . Dysphagia, pharyngoesophageal phase 11/02/2015  . Esophageal spasm 11/02/2015  . Constipation 11/02/2015  . SIADH (syndrome of inappropriate ADH production) (HCC) 11/02/2015  . Urinary retention due to benign prostatic hyperplasia 11/02/2015  . Chronic indwelling Foley catheter 11/02/2015  . Bacteriuria with pyuria 11/02/2015  . Protein-calorie malnutrition, severe 10/30/2015  . ARF (acute renal failure) (HCC) 10/29/2015  . Dyspnea 10/07/2015  . Hyponatremia 06/06/2015  . Coronary artery disease   . Hypertensive heart disease   . Mixed hyperlipidemia   . COPD (chronic obstructive pulmonary disease) (HCC)   .  COPD exacerbation (HCC) 01/11/2015  . Moderate COPD (chronic obstructive pulmonary disease) (HCC) 12/08/2014  . Fall 09/03/2014  . Hip fracture (HCC) 09/03/2014  . PTSD (post-traumatic stress disorder) 09/03/2014  . Bilateral leg edema 09/03/2014  . SOB (shortness of breath) 10/31/2012  . Essential hypertension 10/31/2012  . CAD (coronary artery disease) 10/31/2012  . Hyperlipidemia 10/31/2012  . Tachycardia 10/31/2012    Past Surgical History:  Procedure Laterality Date  . CATARACT EXTRACTION    . CHOLECYSTECTOMY    . CORONARY ANGIOPLASTY     with stent; [facility]  . HAND SURGERY    . HEMORRHOID SURGERY    . HIP SURGERY    . VASCULAR SURGERY     stent R femoral artery    Prior to Admission medications   Medication Sig Start Date End Date Taking? Authorizing Provider  acetaminophen (TYLENOL) 500 MG tablet Take 500 mg by mouth daily as needed for mild pain or moderate pain.     [provider]  albuterol (PROAIR HFA) 108 (90 BASE) MCG/ACT inhaler Inhale 2 puffs into the lungs every 6 (six) hours as needed. For wheezing.    [provider]  albuterol (PROVENTIL) (2.5 MG/3ML) 0.083% nebulizer solution Take 3 mLs (2.5 mg total) by nebulization every 4 (four) hours as needed for wheezing or shortness of breath. 09/28/16   Milagros Loll, MD  antiseptic oral rinse (BIOTENE) LIQD 15 mLs by Mouth Rinse route as needed for dry mouth.    [provider]  aspirin EC 81 MG tablet  Take 81 mg by mouth daily.    [provider]  benzonatate (TESSALON) 100 MG capsule Take 1 capsule (100 mg total) by mouth 3 (three) times daily. 09/25/16   Enid Baas, MD  chlorpheniramine-HYDROcodone (TUSSIONEX) 10-8 MG/5ML SUER Take 5 mLs by mouth every 12 (twelve) hours. 09/25/16   Enid Baas, MD  Cholecalciferol (VITAMIN D3) 1000 UNITS CAPS Take 1,000 Units by mouth daily.    [provider]  feeding supplement (BOOST / RESOURCE BREEZE) LIQD Take 1 Container  by mouth 3 (three) times daily between meals. 11/02/15   Katharina Caper, MD  isosorbide mononitrate (IMDUR) 30 MG 24 hr tablet Take 1 tablet (30 mg total) by mouth daily. 11/02/15   Katharina Caper, MD  levofloxacin (LEVAQUIN) 250 MG tablet Take 1 tablet (250 mg total) by mouth daily. 09/28/16   Milagros Loll, MD  loratadine (CLARITIN) 10 MG tablet Take 10 mg by mouth daily.    [provider]  magnesium hydroxide (MILK OF MAGNESIA) 400 MG/5ML suspension Take 30 mLs by mouth 2 (two) times daily as needed for mild constipation or moderate constipation. 11/02/15   Katharina Caper, MD  Melatonin 5 MG CAPS Take 10 mg by mouth at bedtime.    [provider]  nitroGLYCERIN (NITROSTAT) 0.4 MG SL tablet Place 0.4 mg under the tongue every 5 (five) minutes as needed for chest pain. Reported on 10/19/2015    [provider]  oxybutynin (DITROPAN-XL) 5 MG 24 hr tablet Take as needed for bladder spasms up to three times a day 07/27/16   Michiel Cowboy A, PA-C  pantoprazole (PROTONIX) 40 MG tablet Take 1 tablet (40 mg total) by mouth 2 (two) times daily before a meal. 09/25/16   Enid Baas, MD  pravastatin (PRAVACHOL) 80 MG tablet Take 40 mg by mouth daily.    [provider]  predniSONE (STERAPRED UNI-PAK 21 TAB) 10 MG (21) TBPK tablet 6 tabs PO x 1 day 5 tabs PO x 1 day 4 tabs PO x 1 day 3 tabs PO x 1 day 2 tabs PO x 1 day 1 tab PO x 1 day and stop 09/25/16   Enid Baas, MD  senna-docusate (SENOKOT-S) 8.6-50 MG tablet Take 2 tablets by mouth 2 (two) times daily. 09/08/16   Sharman Cheek, MD  tamsulosin (FLOMAX) 0.4 MG CAPS capsule Take 0.4 mg by mouth daily.     [provider]  tiotropium (SPIRIVA) 18 MCG inhalation capsule Place 1 capsule (18 mcg total) into inhaler and inhale every evening. 10/09/15 10/08/16  Adrian Saran, MD    Allergies Cortisone; Dye fdc red [red dye]; Iodine (kelp)  [iodine]; Peanut oil; Peanut-containing drug products; and  Penicillins  Family History  Problem Relation Age of Onset  . COPD Father   . Bladder Cancer Neg Hx   . Kidney cancer Neg Hx   . Prostate cancer Neg Hx     Social History Social History  Substance Use Topics  . Smoking status: Former Smoker    Packs/day: 3.00    Years: 40.00    Types: Cigarettes  . Smokeless tobacco: Never Used     Comment: quit 62 years  . Alcohol use Yes     Comment: wine at night.     Review of Systems Constitutional: No fever/chills Eyes: No visual changes. ENT: No sore throat. Cardiovascular: Denies chest pain. Respiratory: Denies shortness of breath. Gastrointestinal: No abdominal pain.  No nausea, no vomiting.  No diarrhea.  No constipation. Genitourinary: Negative for  dysuria. Musculoskeletal: Negative for neck pain.  Negative for back pain. Integumentary: Positive for proximal left arm skin tear and left fifth finger skin tear. Neurological: Negative for headaches, focal weakness or numbness.   ____________________________________________   PHYSICAL EXAM:  VITAL SIGNS: ED Triage Vitals  Enc Vitals Group     BP 09/29/16 0133 (!) 148/98     Pulse Rate 09/29/16 0133 (!) 102     Resp 09/29/16 0133 20     Temp 09/29/16 0133 97.9 F (36.6 C)     Temp Source 09/29/16 0133 Oral     SpO2 09/29/16 0133 (!) 89 %     Weight 09/29/16 0135 58.1 kg (128 lb)     Height 09/29/16 0135 1.676 m (5\' 6" )     Head Circumference --      Peak Flow --      Pain Score --      Pain Loc --      Pain Edu? --      Excl. in GC? --     Constitutional: Alert and oriented. Well appearing and in no acute distress. Eyes: Conjunctivae are normal. PERRL. EOMI. Head: Atraumatic. Ears:  Healthy appearing ear canals and TMs bilaterally Nose: No congestion/rhinnorhea. Mouth/Throat: Mucous membranes are moist.  Oropharynx non-erythematous. Neck: No stridor. Cardiovascular: Normal rate, regular rhythm. Good peripheral circulation. Grossly normal heart  sounds. Respiratory: Normal respiratory effort.  No retractions. Lungs CTAB. Gastrointestinal: Soft and nontender. No distention.  Musculoskeletal: No lower extremity tenderness nor edema. No gross deformities of extremities. Neurologic:  Normal speech and language. No gross focal neurologic deficits are appreciated.  Skin:9x7 cm skin tear proximal left arm. 2 cm skin tear left fifth finger. Large contusion distal right tibia. Psychiatric: Mood and affect are normal. Speech and behavior are normal.     Procedures   ____________________________________________   INITIAL IMPRESSION / ASSESSMENT AND PLAN / ED COURSE  Pertinent labs & imaging results that were available during my care of the patient were reviewed by me and considered in my medical decision making (see chart for details).  Patient given a Percocet emergency department secondary to pain. Multiple contusions and abrasions as well as a large skin tear on the patient's left arm noted. Skin tears were all dressed with antibiotic ointment and appropriate dressing. Ice pack applied to soft tissue contusion right lower extremity. Patient also admits to constipation over the past 3 days as such fleets enema was given was MiraLAX.      ____________________________________________  FINAL CLINICAL IMPRESSION(S) / ED DIAGNOSES  Final diagnoses:  Skin tear of left upper arm without complication, initial encounter  Abrasions of multiple sites  Contusion of right lower extremity, initial encounter     MEDICATIONS GIVEN DURING THIS VISIT:  Medications - No data to display   NEW OUTPATIENT MEDICATIONS STARTED DURING THIS VISIT:  New Prescriptions   No medications on file    Modified Medications   No medications on file    Discontinued Medications   No medications on file     Note:  This document was prepared using Dragon voice recognition software and may include unintentional dictation errors.    Darci CurrentBrown,  [place] N, MD 09/29/16 [phone]

## 2016-09-29 NOTE — ED Notes (Signed)
Fleets enema given with a small amount of brown stool in return, pt tolerated well

## 2016-09-30 ENCOUNTER — Telehealth: Payer: Self-pay

## 2016-09-30 NOTE — Telephone Encounter (Signed)
[facility] [place] [facility]. Requesting VO to change foley catheter for the next 60 days. Advised per PA-Shannon McGowan's note on 09/08/16 ok to change catheter. Lana verbalized understanding.

## 2016-10-18 ENCOUNTER — Inpatient Hospital Stay: Payer: Non-veteran care

## 2016-10-18 ENCOUNTER — Emergency Department: Payer: Non-veteran care

## 2016-10-18 ENCOUNTER — Inpatient Hospital Stay
Admission: EM | Admit: 2016-10-18 | Discharge: 2016-10-20 | DRG: 579 | Disposition: A | Discharge status: Home Health | Payer: Non-veteran care | Attending: Internal Medicine | Admitting: Internal Medicine

## 2016-10-18 DIAGNOSIS — Z66 Do not resuscitate: Secondary | ICD-10-CM | POA: Diagnosis present

## 2016-10-18 DIAGNOSIS — F4310 Post-traumatic stress disorder, unspecified: Secondary | ICD-10-CM | POA: Diagnosis present

## 2016-10-18 DIAGNOSIS — I252 Old myocardial infarction: Secondary | ICD-10-CM

## 2016-10-18 DIAGNOSIS — Z6820 Body mass index (BMI) 20.0-20.9, adult: Secondary | ICD-10-CM | POA: Diagnosis not present

## 2016-10-18 DIAGNOSIS — Z91010 Allergy to peanuts: Secondary | ICD-10-CM

## 2016-10-18 DIAGNOSIS — S86991D Other injury of unspecified muscle(s) and tendon(s) at lower leg level, right leg, subsequent encounter: Secondary | ICD-10-CM | POA: Diagnosis not present

## 2016-10-18 DIAGNOSIS — F419 Anxiety disorder, unspecified: Secondary | ICD-10-CM | POA: Diagnosis present

## 2016-10-18 DIAGNOSIS — Z8674 Personal history of sudden cardiac arrest: Secondary | ICD-10-CM | POA: Diagnosis not present

## 2016-10-18 DIAGNOSIS — Z9861 Coronary angioplasty status: Secondary | ICD-10-CM | POA: Diagnosis not present

## 2016-10-18 DIAGNOSIS — R339 Retention of urine, unspecified: Secondary | ICD-10-CM | POA: Diagnosis present

## 2016-10-18 DIAGNOSIS — Z79899 Other long term (current) drug therapy: Secondary | ICD-10-CM | POA: Diagnosis not present

## 2016-10-18 DIAGNOSIS — L03115 Cellulitis of right lower limb: Principal | ICD-10-CM

## 2016-10-18 DIAGNOSIS — J449 Chronic obstructive pulmonary disease, unspecified: Secondary | ICD-10-CM | POA: Diagnosis present

## 2016-10-18 DIAGNOSIS — J9611 Chronic respiratory failure with hypoxia: Secondary | ICD-10-CM | POA: Diagnosis present

## 2016-10-18 DIAGNOSIS — T148XXA Other injury of unspecified body region, initial encounter: Secondary | ICD-10-CM | POA: Diagnosis present

## 2016-10-18 DIAGNOSIS — J9610 Chronic respiratory failure, unspecified whether with hypoxia or hypercapnia: Secondary | ICD-10-CM | POA: Diagnosis present

## 2016-10-18 DIAGNOSIS — Z7982 Long term (current) use of aspirin: Secondary | ICD-10-CM | POA: Diagnosis not present

## 2016-10-18 DIAGNOSIS — I119 Hypertensive heart disease without heart failure: Secondary | ICD-10-CM | POA: Diagnosis present

## 2016-10-18 DIAGNOSIS — E871 Hypo-osmolality and hyponatremia: Secondary | ICD-10-CM | POA: Diagnosis present

## 2016-10-18 DIAGNOSIS — Z87891 Personal history of nicotine dependence: Secondary | ICD-10-CM

## 2016-10-18 DIAGNOSIS — E43 Unspecified severe protein-calorie malnutrition: Secondary | ICD-10-CM | POA: Diagnosis present

## 2016-10-18 DIAGNOSIS — E782 Mixed hyperlipidemia: Secondary | ICD-10-CM | POA: Diagnosis present

## 2016-10-18 DIAGNOSIS — R319 Hematuria, unspecified: Secondary | ICD-10-CM | POA: Diagnosis present

## 2016-10-18 DIAGNOSIS — Z888 Allergy status to other drugs, medicaments and biological substances status: Secondary | ICD-10-CM

## 2016-10-18 DIAGNOSIS — L97919 Non-pressure chronic ulcer of unspecified part of right lower leg with unspecified severity: Secondary | ICD-10-CM | POA: Diagnosis present

## 2016-10-18 DIAGNOSIS — S81801D Unspecified open wound, right lower leg, subsequent encounter: Secondary | ICD-10-CM

## 2016-10-18 DIAGNOSIS — I2510 Atherosclerotic heart disease of native coronary artery without angina pectoris: Secondary | ICD-10-CM | POA: Diagnosis present

## 2016-10-18 DIAGNOSIS — L089 Local infection of the skin and subcutaneous tissue, unspecified: Secondary | ICD-10-CM

## 2016-10-18 LAB — URINALYSIS, COMPLETE (UACMP) WITH MICROSCOPIC
BACTERIA UA: NONE SEEN
BILIRUBIN URINE: NEGATIVE
Glucose, UA: NEGATIVE mg/dL
KETONES UR: NEGATIVE mg/dL
LEUKOCYTES UA: NEGATIVE
NITRITE: NEGATIVE
Protein, ur: NEGATIVE mg/dL
Specific Gravity, Urine: 1.005 (ref 1.005–1.030)
Squamous Epithelial / LPF: NONE SEEN
pH: 8 (ref 5.0–8.0)

## 2016-10-18 LAB — CBC WITH DIFFERENTIAL/PLATELET
BASOS ABS: 0 10*3/uL (ref 0–0.1)
Basophils Relative: 1 %
Eosinophils Absolute: 0.8 10*3/uL — ABNORMAL HIGH (ref 0–0.7)
Eosinophils Relative: 14 %
HEMATOCRIT: 38.4 % — AB (ref 40.0–52.0)
HEMOGLOBIN: 13 g/dL (ref 13.0–18.0)
LYMPHS PCT: 28 %
Lymphs Abs: 1.5 10*3/uL (ref 1.0–3.6)
MCH: 32.9 pg (ref 26.0–34.0)
MCHC: 33.9 g/dL (ref 32.0–36.0)
MCV: 97.3 fL (ref 80.0–100.0)
Monocytes Absolute: 0.5 10*3/uL (ref 0.2–1.0)
Monocytes Relative: 8 %
NEUTROS ABS: 2.7 10*3/uL (ref 1.4–6.5)
Neutrophils Relative %: 49 %
Platelets: 202 10*3/uL (ref 150–440)
RBC: 3.94 MIL/uL — AB (ref 4.40–5.90)
RDW: 14.5 % (ref 11.5–14.5)
WBC: 5.5 10*3/uL (ref 3.8–10.6)

## 2016-10-18 LAB — LACTIC ACID, PLASMA: Lactic Acid, Venous: 1.1 mmol/L (ref 0.5–1.9)

## 2016-10-18 LAB — COMPREHENSIVE METABOLIC PANEL
ALBUMIN: 3.4 g/dL — AB (ref 3.5–5.0)
ALT: 15 U/L — ABNORMAL LOW (ref 17–63)
ANION GAP: 8 (ref 5–15)
AST: 22 U/L (ref 15–41)
Alkaline Phosphatase: 63 U/L (ref 38–126)
BILIRUBIN TOTAL: 1 mg/dL (ref 0.3–1.2)
BUN: 23 mg/dL — ABNORMAL HIGH (ref 6–20)
CO2: 26 mmol/L (ref 22–32)
Calcium: 9.3 mg/dL (ref 8.9–10.3)
Chloride: 96 mmol/L — ABNORMAL LOW (ref 101–111)
Creatinine, Ser: 1.02 mg/dL (ref 0.61–1.24)
GFR calc Af Amer: >60 mL/min (ref >60)
GFR, EST NON AFRICAN AMERICAN: 57 mL/min — AB (ref >60)
GLUCOSE: 112 mg/dL — AB (ref 65–99)
POTASSIUM: 4.4 mmol/L (ref 3.5–5.1)
Sodium: 130 mmol/L — ABNORMAL LOW (ref 135–145)
TOTAL PROTEIN: 6 g/dL — AB (ref 6.5–8.1)

## 2016-10-18 MED ORDER — VANCOMYCIN HCL IN DEXTROSE 750-5 MG/150ML-% IV SOLN: 750.0000 mg | INTRAVENOUS | Status: DC

## 2016-10-18 MED ORDER — TRAMADOL HCL 50 MG PO TABS
50.0000 mg | ORAL_TABLET | Freq: Four times a day (QID) | ORAL | Status: DC | PRN
  Administered 2016-10-19 – 2016-10-20 (×2): 50 mg via ORAL
  Filled 2016-10-18 (×2): qty 1

## 2016-10-18 MED ORDER — TAMSULOSIN HCL 0.4 MG PO CAPS
0.4000 mg | ORAL_CAPSULE | Freq: Every day | ORAL | Status: DC
  Administered 2016-10-19 – 2016-10-20 (×2): 0.4 mg via ORAL
  Filled 2016-10-18 (×2): qty 1

## 2016-10-18 MED ORDER — ACETAMINOPHEN 325 MG PO TABS
650.0000 mg | ORAL_TABLET | Freq: Four times a day (QID) | ORAL | Status: DC | PRN
  Filled 2016-10-18: qty 2

## 2016-10-18 MED ORDER — VANCOMYCIN HCL IN DEXTROSE 1-5 GM/200ML-% IV SOLN: 1000.0000 mg | Freq: Once | INTRAVENOUS | Status: DC

## 2016-10-18 MED ORDER — ENOXAPARIN SODIUM 40 MG/0.4ML ~~LOC~~ SOLN: 40.0000 mg | SUBCUTANEOUS | Status: DC

## 2016-10-18 MED ORDER — BENZONATATE 100 MG PO CAPS
100.0000 mg | ORAL_CAPSULE | Freq: Three times a day (TID) | ORAL | Status: DC
  Administered 2016-10-19 – 2016-10-20 (×6): 100 mg via ORAL
  Filled 2016-10-18 (×6): qty 1

## 2016-10-18 MED ORDER — DOCUSATE SODIUM 100 MG PO CAPS
100.0000 mg | ORAL_CAPSULE | Freq: Two times a day (BID) | ORAL | Status: DC
  Administered 2016-10-19 – 2016-10-20 (×4): 100 mg via ORAL
  Filled 2016-10-18 (×4): qty 1

## 2016-10-18 MED ORDER — ALBUTEROL SULFATE (2.5 MG/3ML) 0.083% IN NEBU
2.5000 mg | INHALATION_SOLUTION | RESPIRATORY_TRACT | Status: DC | PRN
  Administered 2016-10-19: 2.5 mg via RESPIRATORY_TRACT
  Filled 2016-10-18: qty 3

## 2016-10-18 MED ORDER — ENOXAPARIN SODIUM 40 MG/0.4ML ~~LOC~~ SOLN: 30.0000 mg | SUBCUTANEOUS | Status: DC

## 2016-10-18 MED ORDER — ONDANSETRON HCL 4 MG PO TABS: 4.0000 mg | ORAL_TABLET | Freq: Four times a day (QID) | ORAL | Status: DC | PRN

## 2016-10-18 MED ORDER — CLINDAMYCIN PHOSPHATE 300 MG/50ML IV SOLN
300.0000 mg | Freq: Once | INTRAVENOUS | Status: AC
  Administered 2016-10-18: 300 mg via INTRAVENOUS
  Filled 2016-10-18: qty 50

## 2016-10-18 MED ORDER — HYDRALAZINE HCL 20 MG/ML IJ SOLN
10.0000 mg | Freq: Four times a day (QID) | INTRAMUSCULAR | Status: DC | PRN
  Administered 2016-10-18: 10 mg via INTRAVENOUS
  Filled 2016-10-18: qty 1

## 2016-10-18 MED ORDER — PRAVASTATIN SODIUM 20 MG PO TABS
40.0000 mg | ORAL_TABLET | Freq: Every day | ORAL | Status: DC
  Administered 2016-10-19 – 2016-10-20 (×2): 40 mg via ORAL
  Filled 2016-10-18 (×2): qty 2

## 2016-10-18 MED ORDER — ONDANSETRON HCL 4 MG/2ML IJ SOLN: 4.0000 mg | Freq: Four times a day (QID) | INTRAMUSCULAR | Status: DC | PRN

## 2016-10-18 MED ORDER — ASPIRIN EC 81 MG PO TBEC
81.0000 mg | DELAYED_RELEASE_TABLET | Freq: Every day | ORAL | Status: DC
  Administered 2016-10-19 – 2016-10-20 (×2): 81 mg via ORAL
  Filled 2016-10-18 (×2): qty 1

## 2016-10-18 MED ORDER — SENNOSIDES-DOCUSATE SODIUM 8.6-50 MG PO TABS
2.0000 | ORAL_TABLET | Freq: Two times a day (BID) | ORAL | Status: DC
Start: 2016-10-19 — End: 2016-10-20
  Administered 2016-10-19 – 2016-10-20 (×4): 2 via ORAL
  Filled 2016-10-18 (×4): qty 2

## 2016-10-18 MED ORDER — MELATONIN 5 MG PO TABS
10.0000 mg | ORAL_TABLET | Freq: Every day | ORAL | Status: DC
Start: 2016-10-19 — End: 2016-10-20
  Administered 2016-10-19 (×2): 10 mg via ORAL
  Filled 2016-10-18 (×3): qty 2

## 2016-10-18 MED ORDER — BOOST / RESOURCE BREEZE PO LIQD: 1.0000 | Freq: Three times a day (TID) | ORAL | Status: DC

## 2016-10-18 MED ORDER — TIOTROPIUM BROMIDE MONOHYDRATE 18 MCG IN CAPS
18.0000 ug | ORAL_CAPSULE | Freq: Every evening | RESPIRATORY_TRACT | Status: DC
  Administered 2016-10-19: 18 ug via RESPIRATORY_TRACT
  Filled 2016-10-18: qty 5

## 2016-10-18 MED ORDER — ACETAMINOPHEN 650 MG RE SUPP: 650.0000 mg | Freq: Four times a day (QID) | RECTAL | Status: DC | PRN

## 2016-10-18 MED ORDER — PIPERACILLIN-TAZOBACTAM 3.375 G IVPB
3.3750 g | Freq: Three times a day (TID) | INTRAVENOUS | Status: DC
  Administered 2016-10-18 – 2016-10-20 (×5): 3.375 g via INTRAVENOUS
  Filled 2016-10-18 (×4): qty 50

## 2016-10-18 MED ORDER — ISOSORBIDE MONONITRATE ER 30 MG PO TB24
30.0000 mg | ORAL_TABLET | Freq: Every day | ORAL | Status: DC
  Administered 2016-10-19 – 2016-10-20 (×2): 30 mg via ORAL
  Filled 2016-10-18 (×3): qty 1

## 2016-10-18 MED ORDER — PANTOPRAZOLE SODIUM 40 MG PO TBEC
40.0000 mg | DELAYED_RELEASE_TABLET | Freq: Two times a day (BID) | ORAL | Status: DC
  Administered 2016-10-19 – 2016-10-20 (×4): 40 mg via ORAL
  Filled 2016-10-18 (×4): qty 1

## 2016-10-18 MED ORDER — POLYETHYLENE GLYCOL 3350 17 G PO PACK: 17.0000 g | PACK | Freq: Every day | ORAL | Status: DC | PRN

## 2016-10-18 MED ORDER — LORATADINE 10 MG PO TABS
10.0000 mg | ORAL_TABLET | Freq: Every day | ORAL | Status: DC
  Administered 2016-10-19 – 2016-10-20 (×2): 10 mg via ORAL
  Filled 2016-10-18 (×2): qty 1

## 2016-10-18 MED ORDER — NITROGLYCERIN 0.4 MG SL SUBL: 0.4000 mg | SUBLINGUAL_TABLET | SUBLINGUAL | Status: DC | PRN

## 2016-10-18 MED ORDER — ENOXAPARIN SODIUM 30 MG/0.3ML ~~LOC~~ SOLN: 30.0000 mg | SUBCUTANEOUS | Status: DC

## 2016-10-18 NOTE — H&P (Signed)
[facility] [place] [facility]   PATIENT NAME: Daniel Dickson    MR#:  [id number]  DATE OF BIRTH:  1912/11/27  DATE OF ADMISSION:  10/18/2016  PRIMARY CARE PHYSICIAN: [name], MD   REQUESTING/REFERRING PHYSICIAN: Dr. Roxan Hockey  CHIEF COMPLAINT:   Chief Complaint  Patient presents with  . Hematuria  . Cellulitis    HISTORY OF PRESENT ILLNESS:  Daniel Dickson  is a 83 y.o. male with a known history of COPD, chronic respiratory failure, chronic wheezing, hypertension, hyponatremia presents to the emergency room due to worsening right leg pain and ulcer. Patient initially had a small skin tear was seen in the emergency room started on doxycycline and discharged home. He continued to have pain with worsening ulcer. Was managed by home health nurse but due to worsening symptoms the emergency room. Here he has a large 3 x 3 cm medial lower right leg ulcer with clean edges. He had significant purulent discharge with a clot irrigated in the ER. No significant bleeding. Surrounding redness and erythema. Significant pain.  Patient is being admitted for failed outpatient treatment with oral antibiotics.  PAST MEDICAL HISTORY:   Past Medical History:  Diagnosis Date  . Anxiety   . COPD (chronic obstructive pulmonary disease) (HCC)   . Coronary artery disease    a. 2012 s/p MI/cardiac arrest--> PCI mRCA.  Marland Kitchen Hypertension   . Hypertensive heart disease   . Mixed hyperlipidemia   . PTSD (post-traumatic stress disorder)    with headaches    PAST SURGICAL HISTORY:   Past Surgical History:  Procedure Laterality Date  . CATARACT EXTRACTION    . CHOLECYSTECTOMY    . CORONARY ANGIOPLASTY     with stent; [facility]  . HAND SURGERY    . HEMORRHOID SURGERY    . HIP SURGERY    . VASCULAR SURGERY     stent R femoral artery    SOCIAL HISTORY:   Social History  Substance Use Topics  . Smoking status: Former Smoker    Packs/day: 3.00    Years: 40.00    Types: Cigarettes   . Smokeless tobacco: Never Used     Comment: quit 62 years  . Alcohol use Yes     Comment: wine at night.     FAMILY HISTORY:   Family History  Problem Relation Age of Onset  . COPD Father   . Bladder Cancer Neg Hx   . Kidney cancer Neg Hx   . Prostate cancer Neg Hx     DRUG ALLERGIES:   Allergies  Allergen Reactions  . Cortisone Other (See Comments)    Other reaction(s): Other (See Comments) GI Upset Unknown reaction  . Dye Fdc Red [Red Dye] Other (See Comments)    Unknown reaction.  . Iodine (Kelp)  [Iodine]     Other reaction(s): Other (See Comments) GI Upset  . Peanut Oil     Other reaction(s): Unknown  . Peanut-Containing Drug Products     Other reaction(s): Unknown  . Penicillins Rash and Other (See Comments)    GI Upset patient not sure of reaction Has patient had a PCN reaction causing immediate rash, facial/tongue/throat swelling, SOB or lightheadedness with hypotension: Unknown Has patient had a PCN reaction causing severe rash involving mucus membranes or skin necrosis: Unknown Has patient had a PCN reaction that required hospitalization: Unknown Has patient had a PCN reaction occurring within the last 10 years: Unknown If all of the above answers are "NO", then  may proceed with Cephalosporin use.     REVIEW OF SYSTEMS:   Review of Systems  Constitutional: Positive for malaise/fatigue and weight loss. Negative for chills and fever.  HENT: Negative for sore throat.   Eyes: Negative for blurred vision, double vision and pain.  Respiratory: Positive for cough, shortness of breath and wheezing. Negative for hemoptysis.   Cardiovascular: Negative for chest pain, palpitations, orthopnea and leg swelling.  Gastrointestinal: Negative for abdominal pain, constipation, diarrhea, heartburn, nausea and vomiting.  Genitourinary: Negative for dysuria and hematuria.  Musculoskeletal: Negative for back pain and joint pain.  Skin: Negative for rash.  Neurological:  Positive for weakness. Negative for sensory change, speech change, focal weakness and headaches.  Endo/Heme/Allergies: Does not bruise/bleed easily.  Psychiatric/Behavioral: Negative for depression. The patient is not nervous/anxious.     MEDICATIONS AT HOME:   Prior to Admission medications   Medication Sig Start Date End Date Taking? Authorizing Provider  acetaminophen (TYLENOL) 500 MG tablet Take 500 mg by mouth daily as needed for mild pain or moderate pain.    Yes [provider]  albuterol (PROAIR HFA) 108 (90 BASE) MCG/ACT inhaler Inhale 2 puffs into the lungs every 6 (six) hours as needed. For wheezing.   Yes [provider]  albuterol (PROVENTIL) (2.5 MG/3ML) 0.083% nebulizer solution Take 3 mLs (2.5 mg total) by nebulization every 4 (four) hours as needed for wheezing or shortness of breath. 09/28/16  Yes Milagros LollSudini, Antonela Freiman, MD  antiseptic oral rinse (BIOTENE) LIQD 15 mLs by Mouth Rinse route as needed for dry mouth.   Yes [provider]  aspirin EC 81 MG tablet Take 81 mg by mouth daily.   Yes [provider]  benzonatate (TESSALON) 100 MG capsule Take 1 capsule (100 mg total) by mouth 3 (three) times daily. 09/25/16  Yes Enid BaasKalisetti, Radhika, MD  Cholecalciferol (VITAMIN D3) 1000 UNITS CAPS Take 1,000 Units by mouth daily.   Yes [provider]  doxycycline (VIBRAMYCIN) 100 MG capsule Take 100 mg by mouth 2 (two) times daily. 10/14/16  Yes [provider]  isosorbide mononitrate (IMDUR) 30 MG 24 hr tablet Take 1 tablet (30 mg total) by mouth daily. 11/02/15  Yes Katharina CaperVaickute, Rima, MD  loratadine (CLARITIN) 10 MG tablet Take 10 mg by mouth daily.   Yes [provider]  magnesium hydroxide (MILK OF MAGNESIA) 400 MG/5ML suspension Take 30 mLs by mouth 2 (two) times daily as needed for mild constipation or moderate constipation. 11/02/15  Yes Katharina CaperVaickute, Rima, MD  Melatonin 5 MG CAPS Take 10 mg by mouth at bedtime.   Yes [provider]  nitroGLYCERIN (NITROSTAT) 0.4 MG SL tablet Place 0.4 mg under the tongue every 5 (five) minutes as needed for chest pain. Reported on 10/19/2015   Yes [provider]  oxybutynin (DITROPAN-XL) 5 MG 24 hr tablet Take as needed for bladder spasms up to three times a day 07/27/16  Yes McGowan, Carollee HerterShannon A, PA-C  pantoprazole (PROTONIX) 40 MG tablet Take 1 tablet (40 mg total) by mouth 2 (two) times daily before a meal. 09/25/16  Yes Enid BaasKalisetti, Radhika, MD  pravastatin (PRAVACHOL) 80 MG tablet Take 40 mg by mouth daily.   Yes [provider]  senna-docusate (SENOKOT-S) 8.6-50 MG tablet Take 2 tablets by mouth 2 (two) times daily. 09/08/16  Yes Sharman CheekStafford, Phillip, MD  tamsulosin (FLOMAX) 0.4 MG CAPS capsule Take 0.4 mg by mouth daily.    Yes [provider]  tiotropium (SPIRIVA) 18 MCG inhalation capsule  Place 1 capsule (18 mcg total) into inhaler and inhale every evening. 10/09/15 10/18/16 Yes Mody, Patricia Pesa, MD  chlorpheniramine-HYDROcodone (TUSSIONEX) 10-8 MG/5ML SUER Take 5 mLs by mouth every 12 (twelve) hours. Patient not taking: Reported on 10/18/2016 09/25/16   Enid Baas, MD  feeding supplement (BOOST / RESOURCE BREEZE) LIQD Take 1 Container by mouth 3 (three) times daily between meals. 11/02/15   Katharina Caper, MD     VITAL SIGNS:  Blood pressure (!) 184/94, pulse 89, temperature 98 F (36.7 C), temperature source Oral, resp. rate (!) 21, weight 57.2 kg (126 lb), SpO2 96 %.  PHYSICAL EXAMINATION:  Physical Exam  GENERAL:  83 y.o.-year-old patient lying in the bed with no acute distress.  EYES: Pupils equal, round, reactive to light and accommodation. No scleral icterus. Extraocular muscles intact.  HEENT: Head atraumatic, normocephalic. Oropharynx and nasopharynx clear. No oropharyngeal erythema, moist oral mucosa  NECK:  Supple, no jugular venous distention. No thyroid enlargement, no tenderness.  LUNGS: Bilateral wheezing.  CARDIOVASCULAR: S1, S2 normal. No  murmurs, rubs, or gallops.  ABDOMEN: Soft, nontender, nondistended. Bowel sounds present. No organomegaly or mass.  EXTREMITIES: Lower Right leg medial surface with erythema and central 3 x 3 cm ulcer with clean edges and blood. No purulence. Pulses palpable in dorsalis pedis and posterior tibial. NEUROLOGIC: Cranial nerves II through XII are intact. No focal Motor or sensory deficits appreciated b/l PSYCHIATRIC: The patient is alert and oriented x 3. Good affect.  SKIN: No obvious rash, lesion, or ulcer.   LABORATORY PANEL:   CBC  Recent Labs Lab 10/18/16 1815  WBC 5.5  HGB 13.0  HCT 38.4*  PLT 202   ------------------------------------------------------------------------------------------------------------------  Chemistries   Recent Labs Lab 10/18/16 1815  NA 130*  K 4.4  CL 96*  CO2 26  GLUCOSE 112*  BUN 23*  CREATININE 1.02  CALCIUM 9.3  AST 22  ALT 15*  ALKPHOS 63  BILITOT 1.0   ------------------------------------------------------------------------------------------------------------------  Cardiac Enzymes No results for input(s): TROPONINI in the last 168 hours. ------------------------------------------------------------------------------------------------------------------  RADIOLOGY:  Dg Tibia/fibula Right  Result Date: 10/18/2016 CLINICAL DATA:  Right lower leg wound post fall 3 weeks prior. Concern for osteomyelitis. EXAM: RIGHT TIBIA AND FIBULA - 2 VIEW COMPARISON:  Radiograph 09/29/2016 FINDINGS: Soft tissue defect of the distal medial lower leg at site of prior soft tissue prominence. No radiopaque foreign body. No associated periosteal reaction, bony destructive change or abnormal density to suggest osteomyelitis. No fracture. Mild diffuse soft tissue edema. IMPRESSION: 1. No evidence of osteomyelitis. 2. Soft tissue defect of the distal medial lower leg at site of prior soft tissue prominence consistent with wound. Electronically Signed   By:  Rubye Oaks M.D.   On: 10/18/2016 20:09     IMPRESSION AND PLAN:   * Right leg ulcer likely venous with surrounding tinnitus. Failed outpatient treatment with doxycycline. Ulcer looks better at this time after irrigation in the emergency room. A lot of purulent discharge to start. Cultures sent and pending. Will start IV Zosyn. Wait for culture results. Wound care consult.  * COPD with chronic wheezing. Seems stable. Now on room air. Does need oxygen on ambulation. Continue nebulizers and inhalers.  * Hypertension. Continue home medications.  * Chronic hyponatremia stable  * Urinary retention with chronic Foley catheter.  * DVT prophylaxis with Lovenox  All the records are reviewed and case discussed with ED provider. Management plans discussed with the patient, family and they are in agreement.  CODE STATUS: DNR  TOTAL TIME TAKING CARE OF THIS PATIENT: 40 minutes.   Milagros Loll R M.D on 10/18/2016 at 8:40 PM  Between 7am to 6pm - Pager - [phone]  After 6pm go to [email or web address] - password [email or web address] [facility]  [facility] [place] [facility]  Office  [phone]  CC: Primary care physician; [name], MD  Note: This dictation was prepared with Dragon dictation along with smaller phrase technology. Any transcriptional errors that result from this process are unintentional.

## 2016-10-18 NOTE — ED Notes (Signed)
Per EDP, clamp foley tubing and draw urine for sample. This was completed and urine sent to lab.

## 2016-10-18 NOTE — Progress Notes (Signed)
Pharmacy Antibiotic Note  Daniel Dickson is a [age] y.o. male admitted on 10/18/2016 with Wound Infection. Pharmacy has been consulted for Zosyn and Vancomycin  Dosing. Patient received vancomycin 1gm IV x 1 dose and Clindamycin 300mg  IV x 1 dose in ED.   ** Patient has reported allergy of RASH to penicillins, Dr. Roxan Hockeyobinson made aware of allergy and wants to continue with Zosyn**  Plan: Ke: 0.028   Vd: 38.9   T1/2: 25  Will start Vancomycin 750mg  IV every 36 hours with 14 hour stack dosing. Calculated trough at Css is 14. Will plan for trough prior to 3rd dose. Will monitor renal function and adjust dose as needed.   Will start patient on Zosyn 3.375 IV EI every 8 hours.   Weight: 126 lb (57.2 kg)  Temp (24hrs), Avg:98 F (36.7 C), Min:98 F (36.7 C), Max:98 F (36.7 C)   Recent Labs Lab 10/18/16 1815  WBC 5.5  CREATININE 1.02  LATICACIDVEN 1.1    Estimated Creatinine Clearance: 29.6 mL/min (by C-G formula based on SCr of 1.02 mg/dL).    Allergies  Allergen Reactions  . Cortisone Other (See Comments)    Other reaction(s): Other (See Comments) GI Upset Unknown reaction  . Dye Fdc Red [Red Dye] Other (See Comments)    Unknown reaction.  . Iodine (Kelp)  [Iodine]     Other reaction(s): Other (See Comments) GI Upset  . Peanut Oil     Other reaction(s): Unknown  . Peanut-Containing Drug Products     Other reaction(s): Unknown  . Penicillins Rash and Other (See Comments)    GI Upset patient not sure of reaction Has patient had a PCN reaction causing immediate rash, facial/tongue/throat swelling, SOB or lightheadedness with hypotension: Unknown Has patient had a PCN reaction causing severe rash involving mucus membranes or skin necrosis: Unknown Has patient had a PCN reaction that required hospitalization: Unknown Has patient had a PCN reaction occurring within the last 10 years: Unknown If all of the above answers are "NO", then may proceed with Cephalosporin use.      Antimicrobials this admission: 7/10 Zosyn  >>  7/10 Vancomycin  >>   Dose adjustments this admission:  Microbiology results: 7/10 BCx: sent 7/10 Wound Cx: pending  Thank you for allowing pharmacy to be a part of this patient's care.  Gardner CandleSheema M Donnae Michels, PharmD, BCPS Clinical Pharmacist 10/18/2016 8:28 PM

## 2016-10-18 NOTE — ED Notes (Signed)
Hooked patient up to monitor. 

## 2016-10-18 NOTE — Progress Notes (Addendum)
Lovenox changed to 30 mg daily for BMI <40 and CrCl <30.  7/11 13:35 LMWH modified to 40 mg subcutaneously once daily d/t improved creatinine clearance.   Coner Gibbard A. Marmarthookson, Vermont[place]arm.D., BCPS Clinical Pharmacist 10/19/2016 13:35

## 2016-10-18 NOTE — ED Notes (Signed)
Pt's right leg wound placed with non-adhesive dressing and wrapped with gauze.

## 2016-10-18 NOTE — ED Provider Notes (Signed)
[facility] Emergency Department Provider Note    First MD Initiated Contact with Patient 10/18/16 1801     (approximate)  I have reviewed the triage vital signs and the nursing notes.   HISTORY  Chief Complaint Hematuria and Cellulitis    HPI Tallin Hart is a 83 y.o. male presents due to concern for worsening pain and infection to the right lower leg. Patient was recently injured at this location using his walker. Did have a skin tear and is been having home health nurse check on over the past week despite starting a course of doxycycline and the wound has started to become more edematous on an red. Patient denies any fevers. He's been compliant with his medications. Denies any severe pain at this time. No numbness or tingling.   Past Medical History:  Diagnosis Date  . Anxiety   . COPD (chronic obstructive pulmonary disease) (HCC)   . Coronary artery disease    a. 2012 s/p MI/cardiac arrest--> PCI mRCA.  Marland Kitchen Hypertension   . Hypertensive heart disease   . Mixed hyperlipidemia   . PTSD (post-traumatic stress disorder)    with headaches   Family History  Problem Relation Age of Onset  . COPD Father   . Bladder Cancer Neg Hx   . Kidney cancer Neg Hx   . Prostate cancer Neg Hx    Past Surgical History:  Procedure Laterality Date  . CATARACT EXTRACTION    . CHOLECYSTECTOMY    . CORONARY ANGIOPLASTY     with stent; [facility]  . HAND SURGERY    . HEMORRHOID SURGERY    . HIP SURGERY    . VASCULAR SURGERY     stent R femoral artery   Patient Active Problem List   Diagnosis Date Noted  . DNR (do not resuscitate) discussion   . Palliative care by specialist   . Adult failure to thrive   . Gross hematuria   . Acute respiratory failure (HCC) 02/18/2016  . Urinary retention 11/10/2015  . Hyperkalemia 11/02/2015  . Hypotension 11/02/2015  . Dysphagia, pharyngoesophageal phase 11/02/2015  . Esophageal spasm 11/02/2015  . Constipation  11/02/2015  . SIADH (syndrome of inappropriate ADH production) (HCC) 11/02/2015  . Urinary retention due to benign prostatic hyperplasia 11/02/2015  . Chronic indwelling Foley catheter 11/02/2015  . Bacteriuria with pyuria 11/02/2015  . Protein-calorie malnutrition, severe 10/30/2015  . ARF (acute renal failure) (HCC) 10/29/2015  . Dyspnea 10/07/2015  . Hyponatremia 06/06/2015  . Coronary artery disease   . Hypertensive heart disease   . Mixed hyperlipidemia   . COPD (chronic obstructive pulmonary disease) (HCC)   . COPD exacerbation (HCC) 01/11/2015  . Moderate COPD (chronic obstructive pulmonary disease) (HCC) 12/08/2014  . Fall 09/03/2014  . Hip fracture (HCC) 09/03/2014  . PTSD (post-traumatic stress disorder) 09/03/2014  . Bilateral leg edema 09/03/2014  . SOB (shortness of breath) 10/31/2012  . Essential hypertension 10/31/2012  . CAD (coronary artery disease) 10/31/2012  . Hyperlipidemia 10/31/2012  . Tachycardia 10/31/2012      Prior to Admission medications   Medication Sig Start Date End Date Taking? Authorizing Provider  acetaminophen (TYLENOL) 500 MG tablet Take 500 mg by mouth daily as needed for mild pain or moderate pain.    Yes [provider]  albuterol (PROAIR HFA) 108 (90 BASE) MCG/ACT inhaler Inhale 2 puffs into the lungs every 6 (six) hours as needed. For wheezing.   Yes [provider]  albuterol (PROVENTIL) (2.5 MG/3ML)  0.083% nebulizer solution Take 3 mLs (2.5 mg total) by nebulization every 4 (four) hours as needed for wheezing or shortness of breath. 09/28/16  Yes Milagros Loll, MD  antiseptic oral rinse (BIOTENE) LIQD 15 mLs by Mouth Rinse route as needed for dry mouth.   Yes [provider]  aspirin EC 81 MG tablet Take 81 mg by mouth daily.   Yes [provider]  benzonatate (TESSALON) 100 MG capsule Take 1 capsule (100 mg total) by mouth 3 (three) times daily. 09/25/16  Yes Enid Baas, MD  Cholecalciferol  (VITAMIN D3) 1000 UNITS CAPS Take 1,000 Units by mouth daily.   Yes [provider]  doxycycline (VIBRAMYCIN) 100 MG capsule Take 100 mg by mouth 2 (two) times daily. 10/14/16  Yes [provider]  isosorbide mononitrate (IMDUR) 30 MG 24 hr tablet Take 1 tablet (30 mg total) by mouth daily. 11/02/15  Yes Katharina Caper, MD  loratadine (CLARITIN) 10 MG tablet Take 10 mg by mouth daily.   Yes [provider]  magnesium hydroxide (MILK OF MAGNESIA) 400 MG/5ML suspension Take 30 mLs by mouth 2 (two) times daily as needed for mild constipation or moderate constipation. 11/02/15  Yes Katharina Caper, MD  Melatonin 5 MG CAPS Take 10 mg by mouth at bedtime.   Yes [provider]  nitroGLYCERIN (NITROSTAT) 0.4 MG SL tablet Place 0.4 mg under the tongue every 5 (five) minutes as needed for chest pain. Reported on 10/19/2015   Yes [provider]  oxybutynin (DITROPAN-XL) 5 MG 24 hr tablet Take as needed for bladder spasms up to three times a day 07/27/16  Yes McGowan, Carollee Herter A, PA-C  pantoprazole (PROTONIX) 40 MG tablet Take 1 tablet (40 mg total) by mouth 2 (two) times daily before a meal. 09/25/16  Yes Enid Baas, MD  pravastatin (PRAVACHOL) 80 MG tablet Take 40 mg by mouth daily.   Yes [provider]  senna-docusate (SENOKOT-S) 8.6-50 MG tablet Take 2 tablets by mouth 2 (two) times daily. 09/08/16  Yes Sharman Cheek, MD  tamsulosin (FLOMAX) 0.4 MG CAPS capsule Take 0.4 mg by mouth daily.    Yes [provider]  tiotropium (SPIRIVA) 18 MCG inhalation capsule Place 1 capsule (18 mcg total) into inhaler and inhale every evening. 10/09/15 10/18/16 Yes Mody, Patricia Pesa, MD  chlorpheniramine-HYDROcodone (TUSSIONEX) 10-8 MG/5ML SUER Take 5 mLs by mouth every 12 (twelve) hours. Patient not taking: Reported on 10/18/2016 09/25/16   Enid Baas, MD  feeding supplement (BOOST / RESOURCE BREEZE) LIQD Take 1 Container by mouth 3 (three) times daily between  meals. 11/02/15   Katharina Caper, MD    Allergies Cortisone; Dye fdc red [red dye]; Iodine (kelp)  [iodine]; Peanut oil; Peanut-containing drug products; and Penicillins    Social History Social History  Substance Use Topics  . Smoking status: Former Smoker    Packs/day: 3.00    Years: 40.00    Types: Cigarettes  . Smokeless tobacco: Never Used     Comment: quit 62 years  . Alcohol use Yes     Comment: wine at night.     Review of Systems Patient denies headaches, rhinorrhea, blurry vision, numbness, shortness of breath, chest pain, edema, cough, abdominal pain, nausea, vomiting, diarrhea, dysuria, fevers, rashes or hallucinations unless otherwise stated above in HPI. ____________________________________________   PHYSICAL EXAM:  VITAL SIGNS: Vitals:   10/18/16 1807 10/18/16 1820  BP:  (!) 184/94  Pulse: 88 89  Resp: 20 (!) 21  Temp:  98 F (  36.7 C)    Constitutional: Alert and oriented. Well appearing, much younger than stated age, in no acute distress. Eyes: Conjunctivae are normal.  Head: Atraumatic. Nose: No congestion/rhinnorhea. Mouth/Throat: Mucous membranes are moist.   Neck: No stridor. Painless ROM.  Cardiovascular: Normal rate, regular rhythm. Grossly normal heart sounds.  Good peripheral circulation. Respiratory: Normal respiratory effort.  No retractions. Lungs CTAB. Gastrointestinal: Soft and nontender. No distention. No abdominal bruits. No CVA tenderness. Musculoskeletal: large 4cm diameter wet gangrenous wound with fould smelling discharge and eschar.  No FB. No osseous involvement noted, no crepitus, 2+ PT and DP pulses. Ankle ecchymosis Neurologic:  Normal speech and language. No gross focal neurologic deficits are appreciated. No facial droop Skin: wound as described above Psychiatric: Mood and affect are normal. Speech and behavior are normal.  ____________________________________________   LABS (all labs ordered are listed, but only abnormal  results are displayed)  Results for orders placed or performed during the hospital encounter of 10/18/16 (from the past 24 hour(s))  Lactic acid, plasma     Status: None   Collection Time: 10/18/16  6:15 PM  Result Value Ref Range   Lactic Acid, Venous 1.1 0.5 - 1.9 mmol/L  Comprehensive metabolic panel     Status: Abnormal   Collection Time: 10/18/16  6:15 PM  Result Value Ref Range   Sodium 130 (L) 135 - 145 mmol/L   Potassium 4.4 3.5 - 5.1 mmol/L   Chloride 96 (L) 101 - 111 mmol/L   CO2 26 22 - 32 mmol/L   Glucose, Bld 112 (H) 65 - 99 mg/dL   BUN 23 (H) 6 - 20 mg/dL   Creatinine, Ser 1.61 0.61 - 1.24 mg/dL   Calcium 9.3 8.9 - 09.6 mg/dL   Total Protein 6.0 (L) 6.5 - 8.1 g/dL   Albumin 3.4 (L) 3.5 - 5.0 g/dL   AST 22 15 - 41 U/L   ALT 15 (L) 17 - 63 U/L   Alkaline Phosphatase 63 38 - 126 U/L   Total Bilirubin 1.0 0.3 - 1.2 mg/dL   GFR calc non Af Amer 57 (L) >60 mL/min   GFR calc Af Amer >60 >60 mL/min   Anion gap 8 5 - 15  CBC with Differential     Status: Abnormal   Collection Time: 10/18/16  6:15 PM  Result Value Ref Range   WBC 5.5 3.8 - 10.6 K/uL   RBC 3.94 (L) 4.40 - 5.90 MIL/uL   Hemoglobin 13.0 13.0 - 18.0 g/dL   HCT 04.5 (L) 40.9 - 81.1 %   MCV 97.3 80.0 - 100.0 fL   MCH 32.9 26.0 - 34.0 pg   MCHC 33.9 32.0 - 36.0 g/dL   RDW 91.4 78.2 - 95.6 %   Platelets 202 150 - 440 K/uL   Neutrophils Relative % 49 %   Neutro Abs 2.7 1.4 - 6.5 K/uL   Lymphocytes Relative 28 %   Lymphs Abs 1.5 1.0 - 3.6 K/uL   Monocytes Relative 8 %   Monocytes Absolute 0.5 0.2 - 1.0 K/uL   Eosinophils Relative 14 %   Eosinophils Absolute 0.8 (H) 0 - 0.7 K/uL   Basophils Relative 1 %   Basophils Absolute 0.0 0 - 0.1 K/uL   ____________________________________________  EKG My review and personal interpretation at Time: 18:16   Indication: med eval  Rate: 95  Rhythm: sinus Axis: left Other: non specific st changes, no depressions or  STEMI ____________________________________________  RADIOLOGY  I personally reviewed all radiographic  images ordered to evaluate for the above acute complaints and reviewed radiology reports and findings.  These findings were personally discussed with the patient.  Please see medical record for radiology report.  ____________________________________________   PROCEDURES  Procedure(s) performed:  Debridement Date/Time: 10/18/2016 8:11 PM Performed by: Willy EddyOBINSON, Senta Kantor Authorized by: Willy EddyOBINSON, Calton Harshfield  Consent: Verbal consent obtained. Consent given by: patient Patient tolerance: Patient tolerated the procedure well with no immediate complications Comments: Large necrotic eschar was excised from the skin with underlying large amount of wet gangrenous purulent discharge removed roughly the size of a golf ball. Copious irrigation with sterile saline and chlorhexidine was provided. No foreign bodies visualized. No hemorrhage. Does have some component of undermining roughly 2-3 cm from wound border       Critical Care performed: no ____________________________________________   INITIAL IMPRESSION / ASSESSMENT AND PLAN / ED COURSE  Pertinent labs & imaging results that were available during my care of the patient were reviewed by me and considered in my medical decision making (see chart for details).  DDX: abscess, nsti, cellulitis, hematoma, osteo  Calton GoldsGeorge Henry Leisey is a [age] y.o. who presents to the ED with large necrotic and gangrenous wound as described above. Really it is failing outpatient treatment with doxycycline. Large amount of debridement performed at bedside as documented above. No evidence of active hemorrhage. No evidence of necrotizing fasciitis. Patient otherwise clinically stable but based on his presentation to fill patient will require admission for broad-spectrum IV antibiotics and wound consult.  Have discussed with the patient and available family all  diagnostics and treatments performed thus far and all questions were answered to the best of my ability. The patient demonstrates understanding and agreement with plan.        ____________________________________________   FINAL CLINICAL IMPRESSION(S) / ED DIAGNOSES  Final diagnoses:  Wound infection  Cellulitis of right lower extremity      NEW MEDICATIONS STARTED DURING THIS VISIT:  New Prescriptions   No medications on file     Note:  This document was prepared using Dragon voice recognition software and may include unintentional dictation errors.    Willy Eddyobinson, Kaci Freel, MD 10/18/16 2029

## 2016-10-18 NOTE — ED Notes (Signed)
Pt's hearing aides (2) placed in pt's belonging bags with his clothes

## 2016-10-18 NOTE — ED Notes (Signed)
This RN tried to get Zosyn out of pyxis, pyxis reports drawer failed compartment, unable to get medicine at this time. Charge RN notified who states she will call pharmacy.

## 2016-10-18 NOTE — ED Notes (Signed)
Pt arrives via [facility] from home. Pt called ambulance after noticing blood in urine. Pt has indwelling foley in place, states for 1 week. States has had 8-9 foleys. Pt c/o burning from penis x few days. Pt also has a would on R lower leg. Currently covered but appears to have some drainage. Pt was started on doxycycline on 7/9 but states wound is not getting better. Redness noted around wound. Denies DM. Pt is wheezing, states hx COPD, used inhaler once at home. Alert and oriented.

## 2016-10-18 NOTE — ED Notes (Signed)
Notified floor RN pt got chest xray and 10mg  of hydralazine for follow up from report. RN verbalized understanding of this and that pt will be on his way.

## 2016-10-18 NOTE — ED Notes (Signed)
Gave patient a blanket. 

## 2016-10-19 LAB — BASIC METABOLIC PANEL
ANION GAP: 7 (ref 5–15)
BUN: 18 mg/dL (ref 6–20)
CHLORIDE: 100 mmol/L — AB (ref 101–111)
CO2: 25 mmol/L (ref 22–32)
Calcium: 9.2 mg/dL (ref 8.9–10.3)
Creatinine, Ser: 0.84 mg/dL (ref 0.61–1.24)
GFR calc Af Amer: >60 mL/min (ref >60)
GFR calc non Af Amer: >60 mL/min (ref >60)
GLUCOSE: 101 mg/dL — AB (ref 65–99)
POTASSIUM: 4 mmol/L (ref 3.5–5.1)
Sodium: 132 mmol/L — ABNORMAL LOW (ref 135–145)

## 2016-10-19 LAB — CBC
HEMATOCRIT: 39.2 % — AB (ref 40.0–52.0)
HEMOGLOBIN: 13.4 g/dL (ref 13.0–18.0)
MCH: 32.7 pg (ref 26.0–34.0)
MCHC: 34.1 g/dL (ref 32.0–36.0)
MCV: 95.9 fL (ref 80.0–100.0)
Platelets: 214 10*3/uL (ref 150–440)
RBC: 4.09 MIL/uL — ABNORMAL LOW (ref 4.40–5.90)
RDW: 14.9 % — AB (ref 11.5–14.5)
WBC: 6.8 10*3/uL (ref 3.8–10.6)

## 2016-10-19 MED ORDER — ADULT MULTIVITAMIN W/MINERALS CH
1.0000 | ORAL_TABLET | Freq: Every day | ORAL | Status: DC
  Administered 2016-10-20: 1 via ORAL
  Filled 2016-10-19: qty 1

## 2016-10-19 MED ORDER — ENSURE ENLIVE PO LIQD
237.0000 mL | Freq: Three times a day (TID) | ORAL | Status: DC
  Administered 2016-10-19 – 2016-10-20 (×3): 237 mL via ORAL

## 2016-10-19 MED ORDER — ENOXAPARIN SODIUM 40 MG/0.4ML ~~LOC~~ SOLN
40.0000 mg | SUBCUTANEOUS | Status: DC
  Administered 2016-10-19: 40 mg via SUBCUTANEOUS
  Filled 2016-10-19: qty 0.4

## 2016-10-19 MED ORDER — ENSURE ENLIVE PO LIQD: 237.0000 mL | Freq: Three times a day (TID) | ORAL | Status: DC

## 2016-10-19 MED ORDER — ALBUTEROL SULFATE (2.5 MG/3ML) 0.083% IN NEBU
2.5000 mg | INHALATION_SOLUTION | RESPIRATORY_TRACT | Status: DC
  Administered 2016-10-19 – 2016-10-20 (×7): 2.5 mg via RESPIRATORY_TRACT
  Filled 2016-10-19 (×7): qty 3

## 2016-10-19 NOTE — Care Management (Addendum)
[facility] with Lanier ClamLaurie Veesie at the [facility]. Patient refused transfer. Refusal form signed and faxed to [facility] with [facility].   Patient open to [facility] for RN, PT, OT and ST.

## 2016-10-19 NOTE — Progress Notes (Signed)
PT Cancellation Note  Patient Details Name: Daniel Dickson MRN: [id number] DOB: 07/25/1912   Cancelled Treatment:    Reason Eval/Treat Not Completed: Patient declined, no reason specified (Consult received and chart reviewed.  Patient declined participation with session despite encouragement, stating he wasn't up to it today, felt SOB (sats 95% on RA) and knew it wasn't a good time for him.  Will continue efforts at later time/date as patient agrees to participation.  Of note, reports living with his son (with Down Syndrome) in single-story home, ramp access; ambulates for limited household distances with RW.  Was recently participation with HHPT services.  Discharged home with O2 after last hospitalization, but self-discontinued O2 use and was not currently utilizing.  Reports 2 falls within previous year.)   Daniel Dickson, PT, DPT, NCS 10/19/16, 10:42 AM [id number] 002 2013

## 2016-10-19 NOTE — Consult Note (Addendum)
WOC Nurse wound consult note Reason for Consult:Right lower leg, nonhealing traumatic wound, medial aspect.  History PAD, per patient.  Spoke with bedside RN to move foley anchor further up leg towards groin to lessen tension.  Foley is causing pain with movement.  Wound type: Trauma, complicated by vascular disease.  Pressure Injury POA: NA Measurement: 3.4 cm x 3 cm x 0.3 cm  Wound ZOX:WRUEAbed:Ruddy red,. friable Drainage (amount, consistency, odor) Moderate serosanguinous  No odor.  Periwound:Bruising from fall to foot and malleolus.  Periwound erythematous and warm.  Dressing procedure/placement/frequency:Cleanse wound to right lower leg with NS and pat gently dry.  Apply Aquacel AG surgical dressing.  (hydrocolloid border) for absorption and antimicrobial protection.  Change every three days.  Will not follow at this time.  Please re-consult if needed.  Maple HudsonKaren Milanie Rosenfield RN BSN CWON Pager [phone]

## 2016-10-19 NOTE — Progress Notes (Signed)
Initial Nutrition Assessment  DOCUMENTATION CODES:   Severe malnutrition in context of chronic illness  INTERVENTION:   -D/c Boost Breeze po TID, each supplement provides 250 kcal and 9 grams of protein -Ensure Enlive po TID, each supplement provides 350 kcal and 20 grams of protein -MVI daily  NUTRITION DIAGNOSIS:   Malnutrition (Severe) related to chronic illness (COPD) as evidenced by severe depletion of body fat, moderate depletions of muscle mass, severe depletion of muscle mass, energy intake < 75% for > or equal to 1 month.  GOAL:   Patient will meet greater than or equal to 90% of their needs  MONITOR:   PO intake, Supplement acceptance, Labs, Weight trends, Skin, I & O's  REASON FOR ASSESSMENT:   Malnutrition Screening Tool    ASSESSMENT:   Daniel BussingGeorge Dickson  is a [age] y.o. male with a known history of COPD, chronic respiratory failure, chronic wheezing, hypertension, hyponatremia presents to the emergency room due to worsening right leg pain and ulcer  Spoke with pt, who reports a good appetite for 1 week PTA. Pt consumes 3 meals per day, including one Boost supplement and a "Nestle juice" in the afternoon. He shares that his appetite generally has been very poor over the past year, but picked up last week.   Pt estimated he consumed about 50% of his breakfast this AM ("there was too much food. The pancakes were the size of pizzas").   Pt endorses "a big" wt loss over the past year, however, unable to provide further details. Per wt hx, pt has experienced a 7.2% wt loss over the past year and wt has been stable over the past 6 months.   Nutrition-Focused physical exam completed. Findings are moderate to severe fat depletion, moderate to severe muscle depletion, and no edema. Suspect some fat and muscle depletion related to advanced age, however, concerning due to hx of poor oral intake and weight loss.   Discussed importance of good meal and supplement intake to promote  healing, especially with the presence of wounds. Pt amenable to consume Ensure supplements and continue Boost supplements when he returns home.   [facility] pt with right lower leg, nonhealing traumatic wound, medial aspect.   Labs reviewed: Na: 132.   Diet Order:  Diet regular Room service appropriate? Yes; Fluid consistency: Thin  Skin:  Wound (see comment) (Right lower leg, nonhealing traumatic wound, medial aspect)  Last BM:  10/18/16  Height:   Ht Readings from Last 1 Encounters:  10/18/16 5\' 6"  (1.676 m)    Weight:   Wt Readings from Last 1 Encounters:  10/19/16 128 lb 8 oz (58.3 kg)    Ideal Body Weight:  64.5 kg  BMI:  Body mass index is 20.74 kg/m.  Estimated Nutritional Needs:   Kcal:  1500-1700  Protein:  65-80 grams  Fluid:  1.5-1.7 L  EDUCATION NEEDS:   Education needs addressed  Antoinett Dorman A. Mayford KnifeWilliams, RD, LDN, CDE Pager: [phone] After hours Pager: [phone]

## 2016-10-19 NOTE — Progress Notes (Signed)
[employer] [facility] - [place] [facility]   PATIENT NAME: Daniel Dickson    MR#:  [id number]  DATE OF BIRTH:  [birth year]  SUBJECTIVE:  CHIEF COMPLAINT:   Chief Complaint  Patient presents with  . Hematuria  . Cellulitis    had a fall and wound on right leg- came with abscess on the leg. I& D done by ER.  he was also on oral doxy as out pt. Did not help.  REVIEW OF SYSTEMS:  CONSTITUTIONAL: No fever, fatigue or weakness.  EYES: No blurred or double vision.  EARS, NOSE, AND THROAT: No tinnitus or ear pain.  RESPIRATORY: No cough, shortness of breath, wheezing or hemoptysis.  CARDIOVASCULAR: No chest pain, orthopnea, edema.  GASTROINTESTINAL: No nausea, vomiting, diarrhea or abdominal pain.  GENITOURINARY: No dysuria, hematuria.  ENDOCRINE: No polyuria, nocturia,  HEMATOLOGY: No anemia, easy bruising or bleeding SKIN: No rash or lesion. MUSCULOSKELETAL: No joint pain or arthritis.   NEUROLOGIC: No tingling, numbness, weakness.  PSYCHIATRY: No anxiety or depression.   ROS  DRUG ALLERGIES:   Allergies  Allergen Reactions  . Cortisone Other (See Comments)    Other reaction(s): Other (See Comments) GI Upset Unknown reaction  . Dye Fdc Red [Red Dye] Other (See Comments)    Unknown reaction.  . Iodine (Kelp)  [Iodine]     Other reaction(s): Other (See Comments) GI Upset  . Peanut Oil     Other reaction(s): Unknown  . Peanut-Containing Drug Products     Other reaction(s): Unknown  . Penicillins Rash and Other (See Comments)    GI Upset patient not sure of reaction Has patient had a PCN reaction causing immediate rash, facial/tongue/throat swelling, SOB or lightheadedness with hypotension: Unknown Has patient had a PCN reaction causing severe rash involving mucus membranes or skin necrosis: Unknown Has patient had a PCN reaction that required hospitalization: Unknown Has patient had a PCN reaction occurring within the last 10 years: Unknown If all of the above  answers are "NO", then may proceed with Cephalosporin use.     VITALS:  Blood pressure (!) 80/49, pulse 63, temperature 97.7 F (36.5 C), temperature source Oral, resp. rate 18, height 5\' 6"  (1.676 m), weight 58.3 kg (128 lb 8 oz), SpO2 96 %.  PHYSICAL EXAMINATION:   GENERAL:  83 y.o.-year-old patient lying in the bed with no acute distress.  EYES: Pupils equal, round, reactive to light and accommodation. No scleral icterus. Extraocular muscles intact.  HEENT: Head atraumatic, normocephalic. Oropharynx and nasopharynx clear. No oropharyngeal erythema, moist oral mucosa  NECK:  Supple, no jugular venous distention. No thyroid enlargement, no tenderness.  LUNGS: Bilateral wheezing.  CARDIOVASCULAR: S1, S2 normal. No murmurs, rubs, or gallops.  ABDOMEN: Soft, nontender, nondistended. Bowel sounds present. No organomegaly or mass.  EXTREMITIES: Lower Right leg medial surface with erythema and central 3 x 3 cm ulcer with clean edges and blood. No purulence. Pulses palpable in dorsalis pedis and posterior tibial. NEUROLOGIC: Cranial nerves II through XII are intact. No focal Motor or sensory deficits appreciated b/l PSYCHIATRIC: The patient is alert and oriented x 3. Good affect.  SKIN: No obvious rash, lesion, or ulcer.  Physical Exam LABORATORY PANEL:   CBC  Recent Labs Lab 10/19/16 0447  WBC 6.8  HGB 13.4  HCT 39.2*  PLT 214   ------------------------------------------------------------------------------------------------------------------  Chemistries   Recent Labs Lab 10/18/16 1815 10/19/16 0447  NA 130* 132*  K 4.4 4.0  CL 96* 100*  CO2 26 25  GLUCOSE  112* 101*  BUN 23* 18  CREATININE 1.02 0.84  CALCIUM 9.3 9.2  AST 22  --   ALT 15*  --   ALKPHOS 63  --   BILITOT 1.0  --    ------------------------------------------------------------------------------------------------------------------  Cardiac Enzymes No results for input(s): TROPONINI in the last 168  hours. ------------------------------------------------------------------------------------------------------------------  RADIOLOGY:  Dg Tibia/fibula Right  Result Date: 10/18/2016 CLINICAL DATA:  Right lower leg wound post fall 3 weeks prior. Concern for osteomyelitis. EXAM: RIGHT TIBIA AND FIBULA - 2 VIEW COMPARISON:  Radiograph 09/29/2016 FINDINGS: Soft tissue defect of the distal medial lower leg at site of prior soft tissue prominence. No radiopaque foreign body. No associated periosteal reaction, bony destructive change or abnormal density to suggest osteomyelitis. No fracture. Mild diffuse soft tissue edema. IMPRESSION: 1. No evidence of osteomyelitis. 2. Soft tissue defect of the distal medial lower leg at site of prior soft tissue prominence consistent with wound. Electronically Signed   By: Rubye OaksMelanie  Ehinger M.D.   On: 10/18/2016 20:09   Dg Chest Portable 1 View  Result Date: 10/18/2016 CLINICAL DATA:  83 year old male with wheezing. EXAM: PORTABLE CHEST 1 VIEW COMPARISON:  Chest radiograph dated 09/24/2016 FINDINGS: There is hyperexpansion of the lungs with flattening of the diaphragm compatible with underlying emphysema and air trapping. There bibasilar atelectatic changes/scarring. There is no focal consolidation, pleural effusion, or pneumothorax. The cardiac silhouette is within normal limits. There is atherosclerotic calcification of the thoracic aorta. No acute osseous pathology. IMPRESSION: 1. No acute cardiopulmonary process. 2. Emphysema. 3. Atherosclerotic aorta. Electronically Signed   By: Elgie CollardArash  Radparvar M.D.   On: 10/18/2016 23:18    ASSESSMENT AND PLAN:   Active Problems:   Cellulitis of right leg  * Right leg ulcer likely venous with surrounding tinnitus. Failed outpatient treatment with doxycycline. Ulcer looks better at this time after irrigation in the emergency room.  Cultures sent and pending. Cont IV Zosyn. Wait for culture results. Wound care consult.  *  COPD with chronic wheezing. Seems stable. Now on room air. Does need oxygen on ambulation. Continue nebulizers and inhalers.  * Hypertension. Continue home medications.  * Chronic hyponatremia stable  * Urinary retention with chronic Foley catheter.   Pt will be given referal to urology after discharge.  * DVT prophylaxis with Lovenox    All the records are reviewed and case discussed with Care Management/Social Workerr. Management plans discussed with the patient, family and they are in agreement.  CODE STATUS: DNR  TOTAL TIME TAKING CARE OF THIS PATIENT: 35 minutes.     POSSIBLE D/C IN 1-2 DAYS, DEPENDING ON CLINICAL CONDITION.   Altamese DillingVACHHANI, Kanyah Matsushima M.D on 10/19/2016   Between 7am to 6pm - Pager - [phone]  After 6pm go to [email or web address] - password [email or web address]  [facility] [place] [facility]  Office  [phone]  CC: Primary care physician; Dortha KernBliss, Laura K, MD  Note: This dictation was prepared with Dragon dictation along with smaller phrase technology. Any transcriptional errors that result from this process are unintentional.

## 2016-10-20 DIAGNOSIS — S86991D Other injury of unspecified muscle(s) and tendon(s) at lower leg level, right leg, subsequent encounter: Secondary | ICD-10-CM

## 2016-10-20 DIAGNOSIS — S81801D Unspecified open wound, right lower leg, subsequent encounter: Secondary | ICD-10-CM

## 2016-10-20 MED ORDER — AMOXICILLIN-POT CLAVULANATE 875-125 MG PO TABS: 1.0000 | ORAL_TABLET | Freq: Two times a day (BID) | ORAL | 0 refills | Status: AC

## 2016-10-20 NOTE — Care Management Important Message (Signed)
Important Message  Patient Details  Name: Calton GoldsGeorge Henry Chizek MRN: [id number] Date of Birth: 03/18/1913   Medicare Important Message Given:  N/A - LOS <3 / Initial given by admissions    Marily MemosLisa M Steffanie Mingle, RN 10/20/2016, 2:56 PM

## 2016-10-20 NOTE — Consult Note (Signed)
SURGICAL CONSULTATION NOTE (initial) - cpt: [id number]  HISTORY OF PRESENT ILLNESS (HPI):  83 y.o. male presented to [facility] ED for Right ankle pain with a small abrasion or laceration after a fall from standing. He was at that time discharged home with oral antibiotics, but returned for worsening pain and ulceration developing to become a 3 cm x 3 cm medial supramalleolar wound with purulent drainage, old dark blood, and clot. This was drained by ED physician, and the patient was admitted for IV antibiotics. Patient is planned to be discharged home today, but prior to discharge home, medical physician requests evaluation with possible debridement, dressing recommendations, and arrangement for outpatient follow-up. Patient currently reports his Right leg pain has improved significantly, though hasn't resolved, and he denies any fever/chills, CP, or SOB.  Surgery is consulted by medical physician Dr. Elisabeth Pigeon in this context for evaluation and management of Right medial supramalleolar ankle/calf wound.  PAST MEDICAL HISTORY (PMH):  Past Medical History:  Diagnosis Date  . Anxiety   . COPD (chronic obstructive pulmonary disease) (HCC)   . Coronary artery disease    a. 2012 s/p MI/cardiac arrest--> PCI mRCA.  Marland Kitchen Hypertension   . Hypertensive heart disease   . Mixed hyperlipidemia   . PTSD (post-traumatic stress disorder)    with headaches     PAST SURGICAL HISTORY (PSH):  Past Surgical History:  Procedure Laterality Date  . CATARACT EXTRACTION    . CHOLECYSTECTOMY    . CORONARY ANGIOPLASTY     with stent; [facility]  . HAND SURGERY    . HEMORRHOID SURGERY    . HIP SURGERY    . VASCULAR SURGERY     stent R femoral artery     MEDICATIONS:  Prior to Admission medications   Medication Sig Start Date End Date Taking? Authorizing Provider  acetaminophen (TYLENOL) 500 MG tablet Take 500 mg by mouth daily as needed for mild pain or moderate pain.    Yes [provider]  albuterol (PROAIR  HFA) 108 (90 BASE) MCG/ACT inhaler Inhale 2 puffs into the lungs every 6 (six) hours as needed. For wheezing.   Yes [provider]  albuterol (PROVENTIL) (2.5 MG/3ML) 0.083% nebulizer solution Take 3 mLs (2.5 mg total) by nebulization every 4 (four) hours as needed for wheezing or shortness of breath. 09/28/16  Yes Milagros Loll, MD  antiseptic oral rinse (BIOTENE) LIQD 15 mLs by Mouth Rinse route as needed for dry mouth.   Yes [provider]  aspirin EC 81 MG tablet Take 81 mg by mouth daily.   Yes [provider]  benzonatate (TESSALON) 100 MG capsule Take 1 capsule (100 mg total) by mouth 3 (three) times daily. 09/25/16  Yes Enid Baas, MD  Cholecalciferol (VITAMIN D3) 1000 UNITS CAPS Take 1,000 Units by mouth daily.   Yes [provider]  doxycycline (VIBRAMYCIN) 100 MG capsule Take 100 mg by mouth 2 (two) times daily. 10/14/16  Yes [provider]  isosorbide mononitrate (IMDUR) 30 MG 24 hr tablet Take 1 tablet (30 mg total) by mouth daily. 11/02/15  Yes Katharina Caper, MD  loratadine (CLARITIN) 10 MG tablet Take 10 mg by mouth daily.   Yes [provider]  magnesium hydroxide (MILK OF MAGNESIA) 400 MG/5ML suspension Take 30 mLs by mouth 2 (two) times daily as needed for mild constipation or moderate constipation. 11/02/15  Yes Katharina Caper, MD  Melatonin 5 MG CAPS Take 10 mg by mouth at bedtime.   Yes [provider]  nitroGLYCERIN (NITROSTAT) 0.4 MG SL tablet Place 0.4 mg under the tongue every 5 (five) minutes as needed for chest pain. Reported on 10/19/2015   Yes [provider]  oxybutynin (DITROPAN-XL) 5 MG 24 hr tablet Take as needed for bladder spasms up to three times a day 07/27/16  Yes McGowan, Carollee Herter A, PA-C  pantoprazole (PROTONIX) 40 MG tablet Take 1 tablet (40 mg total) by mouth 2 (two) times daily before a meal. 09/25/16  Yes Enid Baas, MD  pravastatin (PRAVACHOL) 80 MG tablet Take 40 mg by mouth  daily.   Yes [provider]  senna-docusate (SENOKOT-S) 8.6-50 MG tablet Take 2 tablets by mouth 2 (two) times daily. 09/08/16  Yes Sharman Cheek, MD  tamsulosin (FLOMAX) 0.4 MG CAPS capsule Take 0.4 mg by mouth daily.    Yes [provider]  tiotropium (SPIRIVA) 18 MCG inhalation capsule Place 1 capsule (18 mcg total) into inhaler and inhale every evening. 10/09/15 10/18/16 Yes Adrian Saran, MD  amoxicillin-clavulanate (AUGMENTIN) 875-125 MG tablet Take 1 tablet by mouth 2 (two) times daily. 10/20/16 10/28/16  Altamese Dilling, MD  chlorpheniramine-HYDROcodone (TUSSIONEX) 10-8 MG/5ML SUER Take 5 mLs by mouth every 12 (twelve) hours. Patient not taking: Reported on 10/18/2016 09/25/16   Enid Baas, MD  feeding supplement (BOOST / RESOURCE BREEZE) LIQD Take 1 Container by mouth 3 (three) times daily between meals. 11/02/15   Katharina Caper, MD     ALLERGIES:  Allergies  Allergen Reactions  . Cortisone Other (See Comments)    Other reaction(s): Other (See Comments) GI Upset Unknown reaction  . Dye Fdc Red [Red Dye] Other (See Comments)    Unknown reaction.  . Iodine (Kelp)  [Iodine]     Other reaction(s): Other (See Comments) GI Upset  . Peanut Oil     Other reaction(s): Unknown  . Peanut-Containing Drug Products     Other reaction(s): Unknown     SOCIAL HISTORY:  Social History   Social History  . Marital status: Widowed    Spouse name: N/A  . Number of children: N/A  . Years of education: N/A   Occupational History  . retired    Social History Main Topics  . Smoking status: Former Smoker    Packs/day: 3.00    Years: 40.00    Types: Cigarettes  . Smokeless tobacco: Never Used     Comment: quit 62 years  . Alcohol use 1.8 oz/week    3 Glasses of wine per week     Comment: wine at night.   . Drug use: No  . Sexual activity: Not Currently   Other Topics Concern  . Not on file   Social History Narrative  . No narrative on file    The  patient currently resides (home / rehab facility / nursing home): Home with home nursing The patient normally is (ambulatory / bedbound): Ambulatory, though with history of falls   FAMILY HISTORY:  Family History  Problem Relation Age of Onset  . COPD Father   . Bladder Cancer Neg Hx   . Kidney cancer Neg Hx   . Prostate cancer Neg Hx     REVIEW OF SYSTEMS:  Constitutional: denies weight loss, fever, chills, or sweats  Eyes: denies any other vision changes, history of eye injury  ENT: denies sore throat, hearing problems  Respiratory: denies shortness of breath, wheezing  Cardiovascular: denies chest pain, palpitations  Gastrointestinal: denies abdominal pain, N/V, or diarrhea Genitourinary: denies burning with urination or urinary frequency Musculoskeletal:  denies any other joint pains or cramps  Skin: denies any other rashes or skin discolorations except as per HPI Neurological: denies any other headache, dizziness, weakness  Psychiatric: denies any other depression, anxiety   All other review of systems were negative   VITAL SIGNS:  Temp:  [98.2 F (36.8 C)] 98.2 F (36.8 C) (07/12 0850) Pulse Rate:  [84] 84 (07/12 0850) Resp:  [20] 20 (07/12 0850) BP: (116)/(51) 116/51 (07/12 0850) SpO2:  [93 %-95 %] 93 % (07/12 0850) Weight:  [127 lb 3.2 oz (57.7 kg)] 127 lb 3.2 oz (57.7 kg) (07/12 0631)     Height: 5\' 6"  (167.6 cm) Weight: 127 lb 3.2 oz (57.7 kg) BMI (Calculated): 20   INTAKE/OUTPUT:  This shift: No intake/output data recorded.  Last 2 shifts: @IOLAST2SHIFTS @   PHYSICAL EXAM:  Constitutional:  -- Normal body habitus, pleasant  -- Awake, alert, and completely oriented x3  Eyes:  -- Pupils equally round and reactive to light  -- No scleral icterus  Ear, nose, and throat:  -- No jugular venous distension  Pulmonary:  -- No crackles  -- Equal breath sounds bilaterally -- Breathing non-labored at rest Cardiovascular:  -- S1, S2 present  -- No pericardial  rubs Gastrointestinal:  -- Abdomen soft, nontender, nondistended, no guarding/rebound  -- No abdominal masses appreciated, pulsatile or otherwise  Musculoskeletal and Integumentary:  -- Wounds or skin discoloration: Moderately tender to palpation Right medial supramalleolar 3 cm x 3 cm wound with exposed tendon and beefy red granulation tissue, some peripheral undermining and skin necrosis as well as muscle necrosis and small amount of clot with minimal fibrinous exudate, no purulent drainage -- Extremities: B/L UE and LE FROM, hands and feet warm, no edema  Neurologic:  -- Motor function: intact and symmetric -- Sensation: intact and symmetric  Pulse/Doppler Exam: (p=palpable; d=doppler signals; 0=none)     Right  Fem  p   Pop  p   DP  p   Labs:  CBC:  Lab Results  Component Value Date   WBC 6.8 10/19/2016   RBC 4.09 (L) 10/19/2016   BMP:  Lab Results  Component Value Date   GLUCOSE 101 (H) 10/19/2016   GLUCOSE 112 (H) 02/06/2014   CO2 25 10/19/2016   CO2 26 02/06/2014   BUN 18 10/19/2016   BUN 29 (H) 02/06/2014   CREATININE 0.84 10/19/2016   CREATININE 1.61 (H) 02/06/2014   CALCIUM 9.2 10/19/2016   CALCIUM 8.9 02/06/2014    Assessment/Plan: (ICD-10's: S81.801D) [age] y.o. male with Right medial supramalleolar necrotic traumatic wound and what sounds like recently drained infected hematoma, complicated by pertinent comorbidities including advanced age, HTN, HLD, CAD s/p PCI for MI, COPD, gait instability, PTSD, and generalized anxiety disorder.   - sharp excisional debridement of necrotic muscle and skin using scissors and forceps  - moist to dry gauze dressing applied with dry Kerlix wrap to secure the moist-to-dry dressing   - change dressing daily (discussed with RN teaching patient's daughter + VNA)  - complete course of antibiotics as per primary medical team   - offered follow-up to patient, who states he will not be able  - patient instructed to call if any  questions or concerns  - medical management of comorbidities  All of the above findings and recommendations were discussed with the patient, and all of patient's questions were answered to his expressed satisfaction.  Thank you for the opportunity to participate in this patient's care.   -- Barbara CowerJason  Honor Loh, MD, RPVI [place]: [facility] - Partnering for exceptional care. Office: [phone]
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
## 2016-10-20 NOTE — Progress Notes (Signed)
Upon assessment this AM, pt right ankle noted to have bruising bilaterally. Edema and pain present. Pt repts pain is "better" today. Pt repts he "fell" at home causing the wound to RLE and bruising. Will continue to monitor.

## 2016-10-20 NOTE — Care Management (Addendum)
TC received from Dr. Elisabeth PigeonVachhani. He states patient will need daily dressing changes to his leg. Notified [facility] of order. TC to daughter, Chase Picketatricia Armstrong, notified her of discharge and dressing change needs. She states, " Great, something else for me to do" I told her I had notified [facility] and that they wouldn't be able to come out everyday for a dressing change visit. She verbalized understanding.

## 2016-10-20 NOTE — Care Management Note (Signed)
Case Management Note  Patient Details  Name: Daniel Dickson MRN: [id number] Date of Birth: 1912/11/18  Subjective/Objective:    Met with patient at bedside. He is alert, oriented and pleasant. Lives at home with his son that has downs syndrome. Has a daughter that lives on his property and assists him with meals and the home. He has home O2 through [employer] states he doesn't use it. Uses a rolling walker. Has home health through [facility] with SN, PT, OT and ST. Will need resumption of care orders at DC. RNCM does not anticipate any additional needs at time of assessment.                Action/Plan: Following progression.  Expected Discharge Date:  10/20/16               Expected Discharge Plan:  [place]  In-House Referral:     Discharge planning Services  CM Consult  Post Acute Care Choice:    Choice offered to:     DME Arranged:    DME Agency:     HH Arranged:  RN, PT, OT, Speech Therapy HH Agency:  [facility]  Status of Service:  In process, will continue to follow  If discussed at Long Length of Stay Meetings, dates discussed:    Additional Comments:  Jolly Mango, RN 10/20/2016, 8:53 AM

## 2016-10-20 NOTE — Progress Notes (Signed)
Pt discharged to home via wheelchair without incident per MD order accompanied by daughter. All d/c teachings done both written and verbal. All questions answered. Pt and daughter aware that RN will be out to educate daughter regarding dressing changes tomorrow. Daughter agrees to perform dressing change to R ankle daily. Daughter verbally talked through how to do dressing. Daughter verb understanding and agrees to comply. Pt discharged with prescription of Augmentin. Daughter agreed to get prescription refilled today.

## 2016-10-20 NOTE — Care Management Note (Signed)
Case Management Note  Patient Details  Name: Daniel Dickson MRN: [id number] Date of Birth: 01/01/1913  Subjective/Objective:  Discharging today                  Action/Plan: Requested resumption of care orders for [facility] for SN, PT, OT and SLP. No other needs identified.   Expected Discharge Date:  10/20/16               Expected Discharge Plan:  Home w Home Health Services  In-House Referral:     Discharge planning Services  CM Consult  Post Acute Care Choice:    Choice offered to:     DME Arranged:    DME Agency:     HH Arranged:  RN, PT, OT, Speech Therapy HH Agency:  [facility]  Status of Service:  In process, will continue to follow  If discussed at Long Length of Stay Meetings, dates discussed:    Additional Comments:  Marily MemosLisa M Sherrita Riederer, RN 10/20/2016, 11:10 AM

## 2016-10-21 LAB — AEROBIC CULTURE W GRAM STAIN (SUPERFICIAL SPECIMEN): Special Requests: NORMAL

## 2016-10-21 LAB — AEROBIC CULTURE  (SUPERFICIAL SPECIMEN)

## 2016-10-21 NOTE — Progress Notes (Signed)
Late entry for missed G-code. Based on review of the evaluation and goals by this therapist, Katherine Watson, MS, CCC-SLP   Katherine Watson, MS, CCC-SLP   

## 2016-10-23 LAB — CULTURE, BLOOD (ROUTINE X 2)
CULTURE: NO GROWTH
Culture: NO GROWTH
Special Requests: ADEQUATE
Special Requests: ADEQUATE

## 2016-10-24 ENCOUNTER — Telehealth: Payer: Self-pay | Admitting: Urology

## 2016-10-24 NOTE — Telephone Encounter (Signed)
Patient's home health nurse called and wanted to clarify that he needed his catheter changed every month. I looked and told her that Vickey HugerLana was given a verbal order per Carollee HerterShannon to do this. She said if there were any questions she could be reached at [phone] and her name is Ronaldo MiyamotoKyle.  Thanks, Marcelino DusterMichelle

## 2016-10-29 NOTE — Discharge Summary (Signed)
[facility] -  at [facility]   PATIENT NAME: Daniel Dickson    MR#:  [id number]  DATE OF BIRTH:  1941-04-03  DATE OF ADMISSION:  10/18/2016 ADMITTING PHYSICIAN: Milagros Loll, MD  DATE OF DISCHARGE: 10/20/2016  4:47 PM  PRIMARY CARE PHYSICIAN: [name], MD    ADMISSION DIAGNOSIS:  Wound infection [T14.8XXA, L08.9] Cellulitis of right lower extremity [L03.115]  DISCHARGE DIAGNOSIS:  Active Problems:   Cellulitis of right leg   SECONDARY DIAGNOSIS:   Past Medical History:  Diagnosis Date  . Anxiety   . COPD (chronic obstructive pulmonary disease) (HCC)   . Coronary artery disease    a. 2012 s/p MI/cardiac arrest--> PCI mRCA.  Marland Kitchen Hypertension   . Hypertensive heart disease   . Mixed hyperlipidemia   . PTSD (post-traumatic stress disorder)    with headaches    HOSPITAL COURSE:   * Right leg ulcer likely venous with surrounding tinnitus. Failed outpatient treatment with doxycycline. Ulcer looks better at this time after irrigation in the emergency room.  Cultures sent and pending. Cont IV Zosyn. followed culture results. Wound care consult.  Surgical consult called in, they did more debridement, suggested wet to dry dressing.  * COPD with chronic wheezing. Seems stable. Now on room air. Does need oxygen on ambulation. Continue nebulizers and inhalers.  * Hypertension. Continue home medications.  * Chronic hyponatremia stable  * Urinary retention with chronic Foley catheter.   Pt will be given referal to urology after discharge.  * DVT prophylaxis with Lovenox  DISCHARGE CONDITIONS:   Stable.  CONSULTS OBTAINED:  Treatment Team:  Ancil Linsey, MD  DRUG ALLERGIES:   Allergies  Allergen Reactions  . Cortisone Other (See Comments)    Other reaction(s): Other (See Comments) GI Upset Unknown reaction  . Dye Fdc Red [Red Dye] Other (See Comments)    Unknown reaction.  . Iodine (Kelp)  [Iodine]     Other  reaction(s): Other (See Comments) GI Upset  . Peanut Oil     Other reaction(s): Unknown  . Peanut-Containing Drug Products     Other reaction(s): Unknown    DISCHARGE MEDICATIONS:   Discharge Medication List as of 10/20/2016  2:46 PM    START taking these medications   Details  amoxicillin-clavulanate (AUGMENTIN) 875-125 MG tablet Take 1 tablet by mouth 2 (two) times daily., Starting Thu 10/20/2016, Until Fri 10/28/2016, Print      CONTINUE these medications which have NOT CHANGED   Details  acetaminophen (TYLENOL) 500 MG tablet Take 500 mg by mouth daily as needed for mild pain or moderate pain. , Historical Med    albuterol (PROAIR HFA) 108 (90 BASE) MCG/ACT inhaler Inhale 2 puffs into the lungs every 6 (six) hours as needed. For wheezing., Historical Med    albuterol (PROVENTIL) (2.5 MG/3ML) 0.083% nebulizer solution Take 3 mLs (2.5 mg total) by nebulization every 4 (four) hours as needed for wheezing or shortness of breath., Starting Wed 09/28/2016, Normal    antiseptic oral rinse (BIOTENE) LIQD 15 mLs by Mouth Rinse route as needed for dry mouth., Historical Med    aspirin EC 81 MG tablet Take 81 mg by mouth daily., Historical Med    benzonatate (TESSALON) 100 MG capsule Take 1 capsule (100 mg total) by mouth 3 (three) times daily., Starting Sun 09/25/2016, Print    Cholecalciferol (VITAMIN D3) 1000 UNITS CAPS Take 1,000 Units by mouth daily., Historical Med    isosorbide mononitrate (IMDUR) 30 MG 24  hr tablet Take 1 tablet (30 mg total) by mouth daily., Starting Mon 11/02/2015, Normal    loratadine (CLARITIN) 10 MG tablet Take 10 mg by mouth daily., Historical Med    magnesium hydroxide (MILK OF MAGNESIA) 400 MG/5ML suspension Take 30 mLs by mouth 2 (two) times daily as needed for mild constipation or moderate constipation., Starting Mon 11/02/2015, Normal    Melatonin 5 MG CAPS Take 10 mg by mouth at bedtime., Historical Med    nitroGLYCERIN (NITROSTAT) 0.4 MG SL tablet Place  0.4 mg under the tongue every 5 (five) minutes as needed for chest pain. Reported on 10/19/2015, Historical Med    oxybutynin (DITROPAN-XL) 5 MG 24 hr tablet Take as needed for bladder spasms up to three times a day, Normal    pantoprazole (PROTONIX) 40 MG tablet Take 1 tablet (40 mg total) by mouth 2 (two) times daily before a meal., Starting Sun 09/25/2016, Print    pravastatin (PRAVACHOL) 80 MG tablet Take 40 mg by mouth daily., Historical Med    senna-docusate (SENOKOT-S) 8.6-50 MG tablet Take 2 tablets by mouth 2 (two) times daily., Starting Thu 09/08/2016, Print    tamsulosin (FLOMAX) 0.4 MG CAPS capsule Take 0.4 mg by mouth daily. , Historical Med    tiotropium (SPIRIVA) 18 MCG inhalation capsule Place 1 capsule (18 mcg total) into inhaler and inhale every evening., Starting Fri 10/09/2015, Until Tue 10/18/2016, Print    chlorpheniramine-HYDROcodone (TUSSIONEX) 10-8 MG/5ML SUER Take 5 mLs by mouth every 12 (twelve) hours., Starting Sun 09/25/2016, Print    feeding supplement (BOOST / RESOURCE BREEZE) LIQD Take 1 Container by mouth 3 (three) times daily between meals., Starting Mon 11/02/2015, Normal      STOP taking these medications     doxycycline (VIBRAMYCIN) 100 MG capsule          DISCHARGE INSTRUCTIONS:    Follow with PMD and Urology in 1-2 weeks.  If you experience worsening of your admission symptoms, develop shortness of breath, life threatening emergency, suicidal or homicidal thoughts you must seek medical attention immediately by calling 911 or calling your MD immediately  if symptoms less severe.  You Must read complete instructions/literature along with all the possible adverse reactions/side effects for all the Medicines you take and that have been prescribed to you. Take any new Medicines after you have completely understood and accept all the possible adverse reactions/side effects.   Please note  You were cared for by a hospitalist during your hospital stay. If  you have any questions about your discharge medications or the care you received while you were in the hospital after you are discharged, you can call the unit and asked to speak with the hospitalist on call if the hospitalist that took care of you is not available. Once you are discharged, your primary care physician will handle any further medical issues. Please note that NO REFILLS for any discharge medications will be authorized once you are discharged, as it is imperative that you return to your primary care physician (or establish a relationship with a primary care physician if you do not have one) for your aftercare needs so that they can reassess your need for medications and monitor your lab values.    Today   CHIEF COMPLAINT:   Chief Complaint  Patient presents with  . Hematuria  . Cellulitis    HISTORY OF PRESENT ILLNESS:  Nussen Pullin  is a 83 y.o. male with a known history of COPD, chronic respiratory failure, chronic  wheezing, hypertension, hyponatremia presents to the emergency room due to worsening right leg pain and ulcer. Patient initially had a small skin tear was seen in the emergency room started on doxycycline and discharged home. He continued to have pain with worsening ulcer. Was managed by home health nurse but due to worsening symptoms the emergency room. Here he has a large 3 x 3 cm medial lower right leg ulcer with clean edges. He had significant purulent discharge with a clot irrigated in the ER. No significant bleeding. Surrounding redness and erythema. Significant pain.  Patient is being admitted for failed outpatient treatment with oral antibiotics.   VITAL SIGNS:  Blood pressure (!) 116/51, pulse 84, temperature 98.2 F (36.8 C), temperature source Oral, resp. rate 20, height 5\' 6"  (1.676 m), weight 57.7 kg (127 lb 3.2 oz), SpO2 93 %.  I/O:  No intake or output data in the 24 hours ending 10/29/16 0737  PHYSICAL EXAMINATION:   GENERAL: [age] y.o.-year-old  patient lying in the bed with no acute distress.  EYES: Pupils equal, round, reactive to light and accommodation. No scleral icterus. Extraocular muscles intact.  HEENT: Head atraumatic, normocephalic. Oropharynx and nasopharynx clear. No oropharyngeal erythema, moist oral mucosa  NECK: Supple, no jugular venous distention. No thyroid enlargement, no tenderness.  LUNGS: Bilateral wheezing.  CARDIOVASCULAR: S1, S2 normal. No murmurs, rubs, or gallops.  ABDOMEN: Soft, nontender, nondistended. Bowel sounds present. No organomegaly or mass.  EXTREMITIES: Lower Right leg medial surface with erythema and central 3 x 3 cm ulcer with clean edges and blood. No purulence. Pulses palpable in dorsalis pedis and posterior tibial. NEUROLOGIC: Cranial nerves II through XII are intact. No focal Motor or sensory deficits appreciated b/l PSYCHIATRIC: The patient is alert and oriented x 3. Good affect.  SKIN: No obvious rash, lesion, or ulcer.   DATA REVIEW:   CBC No results for input(s): WBC, HGB, HCT, PLT in the last 168 hours.  Chemistries  No results for input(s): NA, K, CL, CO2, GLUCOSE, BUN, CREATININE, CALCIUM, MG, AST, ALT, ALKPHOS, BILITOT in the last 168 hours.  Invalid input(s): GFRCGP  Cardiac Enzymes No results for input(s): TROPONINI in the last 168 hours.  Microbiology Results  Results for orders placed or performed during the hospital encounter of 10/18/16  Blood culture (routine x 2)     Status: None   Collection Time: 10/18/16  6:15 PM  Result Value Ref Range Status   Specimen Description BLOOD BLOOD LEFT FOREARM  Final   Special Requests   Final    BOTTLES DRAWN AEROBIC AND ANAEROBIC Blood Culture adequate volume   Culture NO GROWTH 5 DAYS  Final   Report Status 10/23/2016 FINAL  Final  Wound or Superficial Culture     Status: None   Collection Time: 10/18/16  6:41 PM  Result Value Ref Range Status   Specimen Description LEG right  Final   Special Requests Normal  Final    Gram Stain   Final    RARE WBC PRESENT, PREDOMINANTLY PMN RARE GRAM POSITIVE COCCI IN PAIRS Performed at [facility], [address] [place], [place] [address]    Culture FEW STAPHYLOCOCCUS SPECIES (COAGULASE NEGATIVE)  Final   Report Status 10/21/2016 FINAL  Final   Organism ID, Bacteria STAPHYLOCOCCUS SPECIES (COAGULASE NEGATIVE)  Final      Susceptibility   Staphylococcus species (coagulase negative) - MIC*    CIPROFLOXACIN >=8 RESISTANT Resistant     ERYTHROMYCIN >=8 RESISTANT Resistant  GENTAMICIN <=0.5 SENSITIVE Sensitive     OXACILLIN >=4 RESISTANT Resistant     TETRACYCLINE >=16 RESISTANT Resistant     VANCOMYCIN 2 SENSITIVE Sensitive     TRIMETH/SULFA 160 RESISTANT Resistant     CLINDAMYCIN >=8 RESISTANT Resistant     RIFAMPIN <=0.5 SENSITIVE Sensitive     Inducible Clindamycin NEGATIVE Sensitive     * FEW STAPHYLOCOCCUS SPECIES (COAGULASE NEGATIVE)  Blood culture (routine x 2)     Status: None   Collection Time: 10/18/16  6:55 PM  Result Value Ref Range Status   Specimen Description BLOOD BLOOD LEFT HAND  Final   Special Requests   Final    BOTTLES DRAWN AEROBIC AND ANAEROBIC Blood Culture adequate volume   Culture NO GROWTH 5 DAYS  Final   Report Status 10/23/2016 FINAL  Final    RADIOLOGY:  No results found.  EKG:   Orders placed or performed during the hospital encounter of 10/18/16  . EKG 12-Lead  . EKG 12-Lead      Management plans discussed with the patient, family and they are in agreement.  CODE STATUS:  Code Status History    Date Active Date Inactive Code Status Order ID Comments User Context   10/18/2016  8:36 PM 10/20/2016  7:52 PM DNR [id number]  Milagros Loll, MD ED   09/27/2016  3:00 PM 09/28/2016  5:19 PM DNR [id number]  Altamese Dilling, MD Inpatient   09/24/2016  8:52 AM 09/27/2016  3:00 PM Full Code [id number]  Enid Baas, MD Inpatient   08/30/2016  5:16 AM 09/02/2016  4:04 PM Full Code [id number]  Arnaldo Natal, MD  Inpatient   02/18/2016  8:27 AM 02/22/2016  6:50 PM Full Code [id number]  Enedina Finner, MD Inpatient   10/29/2015  9:19 PM 11/02/2015  2:22 PM Full Code [id number]  Enid Baas, MD Inpatient   10/07/2015  7:32 AM 10/09/2015  4:29 PM Full Code [id number]  Ihor Austin, MD Inpatient   06/07/2015 12:04 AM 06/10/2015  4:31 PM Full Code [id number]  Oralia Manis, MD Inpatient   01/11/2015  1:54 PM 01/12/2015  1:58 PM Full Code [id number]  Alford Highland, MD ED    Questions for Most Recent Historical Code Status (Order [id number])    Question Answer Comment   In the event of cardiac or respiratory ARREST Do not call a "code blue"    In the event of cardiac or respiratory ARREST Do not perform Intubation, CPR, defibrillation or ACLS    In the event of cardiac or respiratory ARREST Use medication by any route, position, wound care, and other measures to relive pain and suffering. May use oxygen, suction and manual treatment of airway obstruction as needed for comfort.         Advance Directive Documentation     Most Recent Value  Type of Advance Directive  Healthcare Power of Attorney, Living will  Pre-existing out of facility DNR order (yellow form or pink MOST form)  -  "MOST" Form in Place?  -      TOTAL TIME TAKING CARE OF THIS PATIENT: 35 minutes.    Altamese Dilling M.D on 10/29/2016 at 7:37 AM  Between 7am to 6pm - Pager - [phone]  After 6pm go to [email or web address] - password [email or web address]  [facility]  Office  [phone]  CC: Primary care physician; [name], MD   Note: This dictation was prepared with Dragon dictation along with smaller phrase technology. Any transcriptional errors that result from this  process are unintentional.

## 2016-10-29 NOTE — Discharge Summary (Signed)
[facility] [place] [facility]   PATIENT NAME: Daniel Dickson    MR#:  [id number]  DATE OF BIRTH:  27-Jul-1912  DATE OF ADMISSION:  09/24/2016 ADMITTING PHYSICIAN: Daniel Baas, MD  DATE OF DISCHARGE: 09/28/2016  1:30 PM  PRIMARY CARE PHYSICIAN: Daniel Kern, MD    ADMISSION DIAGNOSIS:  COPD exacerbation (HCC) [J44.1]  DISCHARGE DIAGNOSIS:  Active Problems:   COPD exacerbation (HCC)   COPD (chronic obstructive pulmonary disease) (HCC)   DNR (do not resuscitate) discussion   Palliative care by specialist   Adult failure to thrive   SECONDARY DIAGNOSIS:   Past Medical History:  Diagnosis Date  . Anxiety   . COPD (chronic obstructive pulmonary disease) (HCC)   . Coronary artery disease    a. 2012 s/p MI/cardiac arrest--> PCI mRCA.  Marland Kitchen Hypertension   . Hypertensive heart disease   . Mixed hyperlipidemia   . PTSD (post-traumatic stress disorder)    with headaches    HOSPITAL COURSE:   Daniel Dickson a 83 y.o. malewith a known history of COPD not on home oxygen, CAD, hypertension, PTSD who stays at home and cares for his adult son with Daniel Dickson syndrome came in today with worsening cough and breathing difficulty.  #1 Acute on chronic COPD exacerbation- continue IV steroids, requiring O2 on exertion. - qualified for home oxygen. Supplemental o2 to maintain sats>90% - duonebs, inh - added levaquin for bronchitis symptoms, cough meds - procalcitonin is low. Stop levaquin. - added dbudesonide nebs also. - Still today after walking 15 steps with oxygen he felt very tired and started wheezing so does not seem to be ready for discharge today. But this is much better than yesterday when he started having hypoxia and tachycardia just by 3-4 steps of walking.  #2 Dysphagia- appreciate swallow evaluation. Patient has significant reflux symptoms. -Continue Protonix-changed to twice a day at discharge. Appreciate speech therapy consult. Patient on  dysphagia diet at this time  #3 BPH- chronic urinary retention- has chronic foley Cont home meds- flomax  #4 Hyperlipidemia- statin  #5 DVT Prophylaxis- lovenox  PT consult-needs home health at discharge  DISCHARGE CONDITIONS:   Stable.  CONSULTS OBTAINED:    DRUG ALLERGIES:   Allergies  Allergen Reactions  . Cortisone Other (See Comments)    Other reaction(s): Other (See Comments) GI Upset Unknown reaction  . Dye Fdc Red [Red Dye] Other (See Comments)    Unknown reaction.  . Iodine (Kelp)  [Iodine]     Other reaction(s): Other (See Comments) GI Upset  . Peanut Oil     Other reaction(s): Unknown  . Peanut-Containing Drug Products     Other reaction(s): Unknown    DISCHARGE MEDICATIONS:   Discharge Medication List as of 09/28/2016 10:41 AM    START taking these medications   Details  chlorpheniramine-HYDROcodone (TUSSIONEX) 10-8 MG/5ML SUER Take 5 mLs by mouth every 12 (twelve) hours., Starting Sun 09/25/2016, Print    predniSONE (STERAPRED UNI-PAK 21 TAB) 10 MG (21) TBPK tablet 6 tabs PO x 1 day 5 tabs PO x 1 day 4 tabs PO x 1 day 3 tabs PO x 1 day 2 tabs PO x 1 day 1 tab PO x 1 day and stop, Print      CONTINUE these medications which have CHANGED   Details  albuterol (PROVENTIL) (2.5 MG/3ML) 0.083% nebulizer solution Take 3 mLs (2.5 mg total) by nebulization every 4 (four) hours as needed for wheezing or shortness of breath., Starting Wed  09/28/2016, Normal    benzonatate (TESSALON) 100 MG capsule Take 1 capsule (100 mg total) by mouth 3 (three) times daily., Starting Sun 09/25/2016, Print    pantoprazole (PROTONIX) 40 MG tablet Take 1 tablet (40 mg total) by mouth 2 (two) times daily before a meal., Starting Sun 09/25/2016, Print    levofloxacin (LEVAQUIN) 250 MG tablet Take 1 tablet (250 mg total) by mouth daily., Starting Wed 09/28/2016, Normal      CONTINUE these medications which have NOT CHANGED   Details  acetaminophen (TYLENOL) 500 MG tablet  Take 500 mg by mouth daily as needed for mild pain or moderate pain. , Historical Med    albuterol (PROAIR HFA) 108 (90 BASE) MCG/ACT inhaler Inhale 2 puffs into the lungs every 6 (six) hours as needed. For wheezing., Historical Med    antiseptic oral rinse (BIOTENE) LIQD 15 mLs by Mouth Rinse route as needed for dry mouth., Historical Med    aspirin EC 81 MG tablet Take 81 mg by mouth daily., Historical Med    Cholecalciferol (VITAMIN D3) 1000 UNITS CAPS Take 1,000 Units by mouth daily., Historical Med    isosorbide mononitrate (IMDUR) 30 MG 24 hr tablet Take 1 tablet (30 mg total) by mouth daily., Starting Mon 11/02/2015, Normal    loratadine (CLARITIN) 10 MG tablet Take 10 mg by mouth daily., Historical Med    magnesium hydroxide (MILK OF MAGNESIA) 400 MG/5ML suspension Take 30 mLs by mouth 2 (two) times daily as needed for mild constipation or moderate constipation., Starting Mon 11/02/2015, Normal    Melatonin 5 MG CAPS Take 10 mg by mouth at bedtime., Historical Med    nitroGLYCERIN (NITROSTAT) 0.4 MG SL tablet Place 0.4 mg under the tongue every 5 (five) minutes as needed for chest pain. Reported on 10/19/2015, Historical Med    oxybutynin (DITROPAN-XL) 5 MG 24 hr tablet Take as needed for bladder spasms up to three times a day, Normal    pravastatin (PRAVACHOL) 80 MG tablet Take 40 mg by mouth daily., Historical Med    senna-docusate (SENOKOT-S) 8.6-50 MG tablet Take 2 tablets by mouth 2 (two) times daily., Starting Thu 09/08/2016, Print    tamsulosin (FLOMAX) 0.4 MG CAPS capsule Take 0.4 mg by mouth daily. , Historical Med    tiotropium (SPIRIVA) 18 MCG inhalation capsule Place 1 capsule (18 mcg total) into inhaler and inhale every evening., Starting Fri 10/09/2015, Until Sat 10/08/2016, Print    feeding supplement (BOOST / RESOURCE BREEZE) LIQD Take 1 Container by mouth 3 (three) times daily between meals., Starting Mon 11/02/2015, Normal      STOP taking these medications      cephALEXin (KEFLEX) 500 MG capsule      phenazopyridine (PYRIDIUM) 100 MG tablet          DISCHARGE INSTRUCTIONS:    Follow with PMD in 1-2 weeks.  If you experience worsening of your admission symptoms, develop shortness of breath, life threatening emergency, suicidal or homicidal thoughts you must seek medical attention immediately by calling 911 or calling your MD immediately  if symptoms less severe.  You Must read complete instructions/literature along with all the possible adverse reactions/side effects for all the Medicines you take and that have been prescribed to you. Take any new Medicines after you have completely understood and accept all the possible adverse reactions/side effects.   Please note  You were cared for by a hospitalist during your hospital stay. If you have any questions about your discharge medications or  the care you received while you were in the hospital after you are discharged, you can call the unit and asked to speak with the hospitalist on call if the hospitalist that took care of you is not available. Once you are discharged, your primary care physician will handle any further medical issues. Please note that NO REFILLS for any discharge medications will be authorized once you are discharged, as it is imperative that you return to your primary care physician (or establish a relationship with a primary care physician if you do not have one) for your aftercare needs so that they can reassess your need for medications and monitor your lab values.    Today   CHIEF COMPLAINT:   Chief Complaint  Patient presents with  . Respiratory Distress    HISTORY OF PRESENT ILLNESS:  Daniel Dickson  is a [age] y.o. male with a known history of COPD not on home oxygen, CAD, hypertension, PTSD who stays at home and cares for his adult son with Daniel PinchDowns syndrome came in today with worsening cough and breathing difficulty. Patient says that he has had about 10 ER visits in the  past ER for the same complaints. He has been using his nebs without much help, cough has been worse to the point that he almost passed out. Denies any fevers or chills. Cough has been productive with yellowish phlegm. Denies any recent travel, sick contacts. CXR is clear, has been hypoxic in ED and requiring several nebs for his wheezing. So being admitted.    VITAL SIGNS:  Blood pressure 135/82, pulse 87, temperature 97.5 F (36.4 C), temperature source Oral, resp. rate 18, height 5\' 6"  (1.676 m), weight 58.3 kg (128 lb 8 oz), SpO2 94 %.  I/O:  No intake or output data in the 24 hours ending 10/29/16 0731  PHYSICAL EXAMINATION:   GENERAL: 83 y.o.-year-old elderly patient lying in the bed with no acute distress.  EYES: Pupils equal, round, reactive to light and accommodation. No scleral icterus. Extraocular muscles intact.  HEENT: Head atraumatic, normocephalic. Oropharynx and nasopharynx clear.  NECK: Supple, no jugular venous distention. No thyroid enlargement, no tenderness.  LUNGS: scantbreath sounds bilaterally, minimal scatteredwheezing, norales,rhonchi or crepitation. No use of accessory muscles of respiration.  CARDIOVASCULAR: S1, S2 normal. No rubs, or gallops. 3/6 systolic murmur present ABDOMEN: Soft, nontender, nondistended. Bowel sounds present. No organomegaly or mass.  EXTREMITIES: No pedal edema, cyanosis, or clubbing.  NEUROLOGIC: Cranial nerves II through XII are intact. Muscle strength 4/5 in all extremities. Sensation intact. Gait not checked.  PSYCHIATRIC: The patient is alert and oriented x 3.  SKIN: No obvious rash, lesion, or ulcer.    DATA REVIEW:   CBC No results for input(s): WBC, HGB, HCT, PLT in the last 168 hours.  Chemistries  No results for input(s): NA, K, CL, CO2, GLUCOSE, BUN, CREATININE, CALCIUM, MG, AST, ALT, ALKPHOS, BILITOT in the last 168 hours.  Invalid input(s): GFRCGP  Cardiac Enzymes No results for input(s): TROPONINI in the last  168 hours.  Microbiology Results  Results for orders placed or performed during the hospital encounter of 09/08/16  Urine culture     Status: Abnormal   Collection Time: 09/08/16  4:04 PM  Result Value Ref Range Status   Specimen Description URINE, RANDOM  Final   Special Requests NONE  Final   Culture 30,000 COLONIES/mL ENTEROCOCCUS FAECALIS (A)  Final   Report Status 09/11/2016 FINAL  Final   Organism ID, Bacteria ENTEROCOCCUS FAECALIS (A)  Final      Susceptibility   Enterococcus faecalis - MIC*    AMPICILLIN <=2 SENSITIVE Sensitive     LEVOFLOXACIN >=8 RESISTANT Resistant     NITROFURANTOIN <=16 SENSITIVE Sensitive     VANCOMYCIN 1 SENSITIVE Sensitive     * 30,000 COLONIES/mL ENTEROCOCCUS FAECALIS    RADIOLOGY:  No results found.  EKG:   Orders placed or performed during the hospital encounter of 09/24/16  . ED EKG  . ED EKG  . EKG 12-Lead  . EKG 12-Lead  . EKG 12-Lead  . EKG 12-Lead      Management plans discussed with the patient, family and they are in agreement.  CODE STATUS:  Code Status History    Date Active Date Inactive Code Status Order ID Comments User Context   10/18/2016  8:36 PM 10/20/2016  7:52 PM DNR [id number]  Milagros Loll, MD ED   09/27/2016  3:00 PM 09/28/2016  5:19 PM DNR [id number]  Altamese Dilling, MD Inpatient   09/24/2016  8:52 AM 09/27/2016  3:00 PM Full Code [id number]  Daniel Baas, MD Inpatient   08/30/2016  5:16 AM 09/02/2016  4:04 PM Full Code [id number]  Arnaldo Natal, MD Inpatient   02/18/2016  8:27 AM 02/22/2016  6:50 PM Full Code [id number]  Enedina Finner, MD Inpatient   10/29/2015  9:19 PM 11/02/2015  2:22 PM Full Code [id number]  Daniel Baas, MD Inpatient   10/07/2015  7:32 AM 10/09/2015  4:29 PM Full Code [id number]  Ihor Austin, MD Inpatient   06/07/2015 12:04 AM 06/10/2015  4:31 PM Full Code [id number]  Oralia Manis, MD Inpatient   01/11/2015  1:54 PM 01/12/2015  1:58 PM Full Code [id number]  Alford Highland, MD ED     Questions for Most Recent Historical Code Status (Order [id number])    Question Answer Comment   In the event of cardiac or respiratory ARREST Do not call a "code blue"    In the event of cardiac or respiratory ARREST Do not perform Intubation, CPR, defibrillation or ACLS    In the event of cardiac or respiratory ARREST Use medication by any route, position, wound care, and other measures to relive pain and suffering. May use oxygen, suction and manual treatment of airway obstruction as needed for comfort.         Advance Directive Documentation     Most Recent Value  Type of Advance Directive  Healthcare Power of Attorney, Living will  Pre-existing out of facility DNR order (yellow form or pink MOST form)  -  "MOST" Form in Place?  -      TOTAL TIME TAKING CARE OF THIS PATIENT: 35 minutes.    Altamese Dilling M.D on 10/29/2016 at 7:31 AM  Between 7am to 6pm - Pager - [phone]  After 6pm go to [email or web address] - password [email or web address]  [facility]  Office  [phone]  CC: Primary care physician; Daniel Kern, MD   Note: This dictation was prepared with Dragon dictation along with smaller phrase technology. Any transcriptional errors that result from this process are unintentional.

## 2016-11-01 DIAGNOSIS — S81801D Unspecified open wound, right lower leg, subsequent encounter: Secondary | ICD-10-CM

## 2016-11-01 DIAGNOSIS — S86991D Other injury of unspecified muscle(s) and tendon(s) at lower leg level, right leg, subsequent encounter: Secondary | ICD-10-CM

## 2016-11-11 ENCOUNTER — Emergency Department: Payer: Medicare PPO

## 2016-11-11 ENCOUNTER — Observation Stay
Admission: EM | Admit: 2016-11-11 | Discharge: 2016-11-13 | Disposition: A | Discharge status: Home / Self Care | Payer: Medicare PPO | Attending: Internal Medicine | Admitting: Internal Medicine

## 2016-11-11 DIAGNOSIS — Z91010 Allergy to peanuts: Secondary | ICD-10-CM | POA: Insufficient documentation

## 2016-11-11 DIAGNOSIS — J9620 Acute and chronic respiratory failure, unspecified whether with hypoxia or hypercapnia: Principal | ICD-10-CM | POA: Insufficient documentation

## 2016-11-11 DIAGNOSIS — E782 Mixed hyperlipidemia: Secondary | ICD-10-CM | POA: Insufficient documentation

## 2016-11-11 DIAGNOSIS — J209 Acute bronchitis, unspecified: Secondary | ICD-10-CM | POA: Insufficient documentation

## 2016-11-11 DIAGNOSIS — Z436 Encounter for attention to other artificial openings of urinary tract: Secondary | ICD-10-CM | POA: Insufficient documentation

## 2016-11-11 DIAGNOSIS — Z87891 Personal history of nicotine dependence: Secondary | ICD-10-CM | POA: Insufficient documentation

## 2016-11-11 DIAGNOSIS — Z79899 Other long term (current) drug therapy: Secondary | ICD-10-CM | POA: Insufficient documentation

## 2016-11-11 DIAGNOSIS — E222 Syndrome of inappropriate secretion of antidiuretic hormone: Secondary | ICD-10-CM | POA: Insufficient documentation

## 2016-11-11 DIAGNOSIS — D72829 Elevated white blood cell count, unspecified: Secondary | ICD-10-CM | POA: Diagnosis not present

## 2016-11-11 DIAGNOSIS — N400 Enlarged prostate without lower urinary tract symptoms: Secondary | ICD-10-CM | POA: Diagnosis not present

## 2016-11-11 DIAGNOSIS — F4310 Post-traumatic stress disorder, unspecified: Secondary | ICD-10-CM | POA: Insufficient documentation

## 2016-11-11 DIAGNOSIS — Z955 Presence of coronary angioplasty implant and graft: Secondary | ICD-10-CM | POA: Insufficient documentation

## 2016-11-11 DIAGNOSIS — L97919 Non-pressure chronic ulcer of unspecified part of right lower leg with unspecified severity: Secondary | ICD-10-CM | POA: Insufficient documentation

## 2016-11-11 DIAGNOSIS — I2510 Atherosclerotic heart disease of native coronary artery without angina pectoris: Secondary | ICD-10-CM | POA: Diagnosis not present

## 2016-11-11 DIAGNOSIS — Z8674 Personal history of sudden cardiac arrest: Secondary | ICD-10-CM | POA: Insufficient documentation

## 2016-11-11 DIAGNOSIS — R339 Retention of urine, unspecified: Secondary | ICD-10-CM | POA: Insufficient documentation

## 2016-11-11 DIAGNOSIS — Z7982 Long term (current) use of aspirin: Secondary | ICD-10-CM | POA: Diagnosis not present

## 2016-11-11 DIAGNOSIS — I252 Old myocardial infarction: Secondary | ICD-10-CM | POA: Diagnosis not present

## 2016-11-11 DIAGNOSIS — E871 Hypo-osmolality and hyponatremia: Secondary | ICD-10-CM | POA: Insufficient documentation

## 2016-11-11 DIAGNOSIS — J441 Chronic obstructive pulmonary disease with (acute) exacerbation: Secondary | ICD-10-CM | POA: Diagnosis present

## 2016-11-11 DIAGNOSIS — I83019 Varicose veins of right lower extremity with ulcer of unspecified site: Secondary | ICD-10-CM | POA: Insufficient documentation

## 2016-11-11 DIAGNOSIS — Z888 Allergy status to other drugs, medicaments and biological substances status: Secondary | ICD-10-CM | POA: Diagnosis not present

## 2016-11-11 DIAGNOSIS — J440 Chronic obstructive pulmonary disease with acute lower respiratory infection: Secondary | ICD-10-CM | POA: Insufficient documentation

## 2016-11-11 DIAGNOSIS — Z91041 Radiographic dye allergy status: Secondary | ICD-10-CM | POA: Diagnosis not present

## 2016-11-11 DIAGNOSIS — I119 Hypertensive heart disease without heart failure: Secondary | ICD-10-CM | POA: Insufficient documentation

## 2016-11-11 LAB — URINALYSIS, COMPLETE (UACMP) WITH MICROSCOPIC
BACTERIA UA: NONE SEEN
Bilirubin Urine: NEGATIVE
GLUCOSE, UA: NEGATIVE mg/dL
Ketones, ur: NEGATIVE mg/dL
NITRITE: NEGATIVE
PROTEIN: 30 mg/dL — AB
Specific Gravity, Urine: 1.014 (ref 1.005–1.030)
pH: 7 (ref 5.0–8.0)

## 2016-11-11 LAB — BASIC METABOLIC PANEL
Anion gap: 6 (ref 5–15)
BUN: 19 mg/dL (ref 6–20)
CHLORIDE: 101 mmol/L (ref 101–111)
CO2: 27 mmol/L (ref 22–32)
Calcium: 9.3 mg/dL (ref 8.9–10.3)
Creatinine, Ser: 1.09 mg/dL (ref 0.61–1.24)
GFR calc non Af Amer: 53 mL/min — ABNORMAL LOW (ref >60)
Glucose, Bld: 119 mg/dL — ABNORMAL HIGH (ref 65–99)
POTASSIUM: 4.3 mmol/L (ref 3.5–5.1)
SODIUM: 134 mmol/L — AB (ref 135–145)

## 2016-11-11 LAB — CBC
HEMATOCRIT: 41.6 % (ref 40.0–52.0)
HEMOGLOBIN: 14 g/dL (ref 13.0–18.0)
MCH: 32.8 pg (ref 26.0–34.0)
MCHC: 33.6 g/dL (ref 32.0–36.0)
MCV: 97.4 fL (ref 80.0–100.0)
PLATELETS: 137 10*3/uL — AB (ref 150–440)
RBC: 4.28 MIL/uL — AB (ref 4.40–5.90)
RDW: 15.5 % — ABNORMAL HIGH (ref 11.5–14.5)
WBC: 15.7 10*3/uL — AB (ref 3.8–10.6)

## 2016-11-11 LAB — TROPONIN I: Troponin I: 0.03 ng/mL (ref <0.03)

## 2016-11-11 MED ORDER — ALBUTEROL SULFATE (2.5 MG/3ML) 0.083% IN NEBU
2.5000 mg | INHALATION_SOLUTION | RESPIRATORY_TRACT | Status: DC | PRN
  Administered 2016-11-11 – 2016-11-13 (×4): 2.5 mg via RESPIRATORY_TRACT
  Filled 2016-11-11 (×4): qty 3

## 2016-11-11 MED ORDER — POLYETHYLENE GLYCOL 3350 17 G PO PACK: 17.0000 g | PACK | Freq: Every day | ORAL | Status: DC | PRN

## 2016-11-11 MED ORDER — TRAMADOL HCL 50 MG PO TABS: 50.0000 mg | ORAL_TABLET | Freq: Four times a day (QID) | ORAL | Status: DC | PRN

## 2016-11-11 MED ORDER — ALBUTEROL SULFATE HFA 108 (90 BASE) MCG/ACT IN AERS: 2.0000 | INHALATION_SPRAY | Freq: Four times a day (QID) | RESPIRATORY_TRACT | Status: DC | PRN

## 2016-11-11 MED ORDER — ACETAMINOPHEN 500 MG PO TABS: 500.0000 mg | ORAL_TABLET | Freq: Every day | ORAL | Status: DC | PRN

## 2016-11-11 MED ORDER — IPRATROPIUM-ALBUTEROL 0.5-2.5 (3) MG/3ML IN SOLN
3.0000 mL | Freq: Once | RESPIRATORY_TRACT | Status: AC
  Administered 2016-11-11: 3 mL via RESPIRATORY_TRACT
  Filled 2016-11-11: qty 3

## 2016-11-11 MED ORDER — BENZONATATE 100 MG PO CAPS
100.0000 mg | ORAL_CAPSULE | Freq: Three times a day (TID) | ORAL | Status: DC | PRN
  Administered 2016-11-13: 100 mg via ORAL
  Filled 2016-11-11: qty 1

## 2016-11-11 MED ORDER — METHYLPREDNISOLONE SODIUM SUCC 125 MG IJ SOLR
125.0000 mg | Freq: Once | INTRAMUSCULAR | Status: AC
  Administered 2016-11-11: 125 mg via INTRAVENOUS
  Filled 2016-11-11: qty 2

## 2016-11-11 MED ORDER — DOXYCYCLINE HYCLATE 100 MG PO TABS
100.0000 mg | ORAL_TABLET | Freq: Two times a day (BID) | ORAL | Status: DC
  Administered 2016-11-11 – 2016-11-13 (×5): 100 mg via ORAL
  Filled 2016-11-11 (×6): qty 1

## 2016-11-11 MED ORDER — ENOXAPARIN SODIUM 30 MG/0.3ML ~~LOC~~ SOLN
30.0000 mg | SUBCUTANEOUS | Status: DC
  Administered 2016-11-11 – 2016-11-12 (×2): 30 mg via SUBCUTANEOUS
  Filled 2016-11-11 (×2): qty 0.3

## 2016-11-11 MED ORDER — METHYLPREDNISOLONE SODIUM SUCC 125 MG IJ SOLR
60.0000 mg | INTRAMUSCULAR | Status: DC
  Administered 2016-11-11 – 2016-11-12 (×2): 60 mg via INTRAVENOUS
  Filled 2016-11-11 (×2): qty 2

## 2016-11-11 MED ORDER — PANTOPRAZOLE SODIUM 40 MG PO TBEC
40.0000 mg | DELAYED_RELEASE_TABLET | Freq: Two times a day (BID) | ORAL | Status: DC
  Administered 2016-11-11 – 2016-11-13 (×4): 40 mg via ORAL
  Filled 2016-11-11 (×4): qty 1

## 2016-11-11 MED ORDER — ISOSORBIDE MONONITRATE ER 30 MG PO TB24
30.0000 mg | ORAL_TABLET | Freq: Every day | ORAL | Status: DC
  Administered 2016-11-11 – 2016-11-13 (×3): 30 mg via ORAL
  Filled 2016-11-11 (×3): qty 1

## 2016-11-11 MED ORDER — TIOTROPIUM BROMIDE MONOHYDRATE 18 MCG IN CAPS
18.0000 ug | ORAL_CAPSULE | Freq: Every evening | RESPIRATORY_TRACT | Status: DC
  Administered 2016-11-11 – 2016-11-12 (×2): 18 ug via RESPIRATORY_TRACT
  Filled 2016-11-11: qty 5

## 2016-11-11 MED ORDER — BOOST / RESOURCE BREEZE PO LIQD
1.0000 | Freq: Three times a day (TID) | ORAL | Status: DC
  Administered 2016-11-11 – 2016-11-12 (×4): 1 via ORAL

## 2016-11-11 MED ORDER — ASPIRIN EC 81 MG PO TBEC
81.0000 mg | DELAYED_RELEASE_TABLET | Freq: Every day | ORAL | Status: DC
  Administered 2016-11-12 – 2016-11-13 (×2): 81 mg via ORAL
  Filled 2016-11-11 (×2): qty 1

## 2016-11-11 MED ORDER — MELATONIN 5 MG PO TABS
10.0000 mg | ORAL_TABLET | Freq: Every day | ORAL | Status: DC
  Administered 2016-11-11 – 2016-11-12 (×2): 10 mg via ORAL
  Filled 2016-11-11 (×3): qty 2

## 2016-11-11 MED ORDER — ACETAMINOPHEN 650 MG RE SUPP: 650.0000 mg | Freq: Four times a day (QID) | RECTAL | Status: DC | PRN

## 2016-11-11 MED ORDER — PRAVASTATIN SODIUM 20 MG PO TABS
40.0000 mg | ORAL_TABLET | Freq: Every day | ORAL | Status: DC
  Administered 2016-11-11 – 2016-11-13 (×3): 40 mg via ORAL
  Filled 2016-11-11 (×3): qty 2

## 2016-11-11 MED ORDER — MAGNESIUM HYDROXIDE 400 MG/5ML PO SUSP
30.0000 mL | Freq: Two times a day (BID) | ORAL | Status: DC | PRN
  Filled 2016-11-11: qty 30

## 2016-11-11 MED ORDER — ACETAMINOPHEN 325 MG PO TABS: 650.0000 mg | ORAL_TABLET | Freq: Four times a day (QID) | ORAL | Status: DC | PRN

## 2016-11-11 MED ORDER — VITAMIN D 1000 UNITS PO TABS
1000.0000 [IU] | ORAL_TABLET | Freq: Every day | ORAL | Status: DC
  Administered 2016-11-11 – 2016-11-13 (×3): 1000 [IU] via ORAL
  Filled 2016-11-11 (×3): qty 1

## 2016-11-11 MED ORDER — TAMSULOSIN HCL 0.4 MG PO CAPS
0.4000 mg | ORAL_CAPSULE | Freq: Every day | ORAL | Status: DC
  Administered 2016-11-11 – 2016-11-13 (×3): 0.4 mg via ORAL
  Filled 2016-11-11 (×3): qty 1

## 2016-11-11 MED ORDER — SENNOSIDES-DOCUSATE SODIUM 8.6-50 MG PO TABS: 2.0000 | ORAL_TABLET | Freq: Two times a day (BID) | ORAL | Status: DC | PRN

## 2016-11-11 NOTE — Care Management Obs Status (Signed)
MEDICARE OBSERVATION STATUS NOTIFICATION   Patient Details  Name: Daniel Dickson MRN: [id number] Date of Birth: 09/26/1912   Medicare Observation Status Notification Given:  Yes    Gwenette GreetBrenda S Kaydee Magel, RN 11/11/2016, 3:18 PM

## 2016-11-11 NOTE — Progress Notes (Signed)
Anticoagulation monitoring(Lovenox):  83yo  male ordered Lovenox 40 mg Q24h  Filed Weights   11/11/16 0734 11/11/16 1116  Weight: 126 lb (57.2 kg) 127 lb (57.6 kg)   BMI 20.4   Lab Results  Component Value Date   CREATININE 1.09 11/11/2016   CREATININE 0.84 10/19/2016   CREATININE 1.02 10/18/2016   Estimated Creatinine Clearance: 27.9 mL/min (by C-G formula based on SCr of 1.09 mg/dL). Hemoglobin & Hematocrit     Component Value Date/Time   HGB 14.0 11/11/2016 0737   HGB 15.6 02/06/2014 0153   HCT 41.6 11/11/2016 0737   HCT 47.3 02/06/2014 0153     Per Protocol for Patient with estCrcl < 30 ml/min and BMI < 40, will transition to Lovenox 30 mg Q24h.

## 2016-11-11 NOTE — Consult Note (Signed)
WOC Nurse wound consult note Reason for Consult:Nonhealing chronic wound to right anterior lower leg, present on admission.  [facility]. HIs daughter was cleaning and applying a dry dressing daily.  WOund bed is clean, pale pink and nongranulating.   Wound type:Nonhealing trauma wound complicated by vascular disease.   Pressure Injury POA: NA Measurement: 4 cm x 4cm x 0.5 cm  Wound UJW:JXBJbed:pale pink and nongranulating Drainage (amount, consistency, odor) minimal serosanguinous  No odor.   Periwound:intact.   Dressing procedure/placement/frequency: Cleanse wound to right lower leg with NS and pat gently dry.  Apply calcium alginate to wound bed for absorption.  COver with dry dressing. Change alginate daily and dry dressing twice weekly and PRN soilage (using silicone border foam that can be replaced) Will not follow at this time.  Please re-consult if needed.  Maple HudsonKaren Murriel Holwerda RN BSN CWON Pager [phone]

## 2016-11-11 NOTE — ED Triage Notes (Signed)
Pt came to ED via EMS from home c/o sob and cough starting yesterday. Given 1 duoneb in route with EMS. Has indwelling foley catheter and sore on right foot, wrapped.

## 2016-11-11 NOTE — H&P (Signed)
[facility] - Daniel Dickson at [facility]   PATIENT NAME: Daniel Dickson    MR#:  [id number]  DATE OF BIRTH:  1912-06-29  DATE OF ADMISSION:  11/11/2016  PRIMARY CARE PHYSICIAN: Daniel Kern, MD   REQUESTING/REFERRING PHYSICIAN: Dr. Daryel Dickson  CHIEF COMPLAINT:   Shortness of breath and cough with sore throat HISTORY OF PRESENT ILLNESS:  Daniel Dickson  is a 83 y.o. male with a known history of COPD, hypertension comes in the emergency room from home with increasing shortness of breath and cough with mild productive phlegm and sore throat for 2 days. Patient denies any fever. Denies any chest pain. He uses oxygen during nighttime. His saturations remained more than 92% on room air. Patient complains of subjective shortness of breath and feels he is running out of breath and once oxygen. He is being admitted for COPD exacerbation and acute on chronic.  PAST MEDICAL HISTORY:   Past Medical History:  Diagnosis Date  . Anxiety   . COPD (chronic obstructive pulmonary disease) (HCC)   . Coronary artery disease    a. 2012 s/p MI/cardiac arrest--> PCI mRCA.  Marland Kitchen Hypertension   . Hypertensive heart disease   . Mixed hyperlipidemia   . PTSD (post-traumatic stress disorder)    with headaches    PAST SURGICAL HISTOIRY:   Past Surgical History:  Procedure Laterality Date  . CATARACT EXTRACTION    . CHOLECYSTECTOMY    . CORONARY ANGIOPLASTY     with stent; [facility]  . HAND SURGERY    . HEMORRHOID SURGERY    . HIP SURGERY    . VASCULAR SURGERY     stent R femoral artery    SOCIAL HISTORY:   Social History  Substance Use Topics  . Smoking status: Former Smoker    Packs/day: 3.00    Years: 40.00    Types: Cigarettes  . Smokeless tobacco: Never Used     Comment: quit 62 years  . Alcohol use 1.8 oz/week    3 Glasses of wine per week     Comment: wine at night.     FAMILY HISTORY:   Family History  Problem Relation Age of Onset  . COPD Father   .  Bladder Cancer Neg Hx   . Kidney cancer Neg Hx   . Prostate cancer Neg Hx     DRUG ALLERGIES:   Allergies  Allergen Reactions  . Iodine (Kelp) [Iodine] Other (See Comments)    Other reaction(s): Other (See Comments) GI Upset    REVIEW OF SYSTEMS:  Review of Systems  Constitutional: Negative for chills, fever and weight loss.  HENT: Positive for congestion and sore throat. Negative for ear discharge, ear pain and nosebleeds.   Eyes: Negative for blurred vision, pain and discharge.  Respiratory: Positive for cough, sputum production and shortness of breath. Negative for wheezing and stridor.   Cardiovascular: Negative for chest pain, palpitations, orthopnea and PND.  Gastrointestinal: Negative for abdominal pain, diarrhea, nausea and vomiting.  Genitourinary: Negative for frequency and urgency.  Musculoskeletal: Negative for back pain and joint pain.  Neurological: Positive for weakness. Negative for sensory change, speech change and focal weakness.  Psychiatric/Behavioral: Negative for depression and hallucinations. The patient is not nervous/anxious.      MEDICATIONS AT HOME:   Prior to Admission medications   Medication Sig Start Date End Date Taking? Authorizing Provider  acetaminophen (TYLENOL) 500 MG tablet Take 500 mg by mouth daily as needed for mild pain or moderate  pain.    Yes [provider]  albuterol (PROAIR HFA) 108 (90 BASE) MCG/ACT inhaler Inhale 2 puffs into the lungs every 6 (six) hours as needed. For wheezing.   Yes [provider]  albuterol (PROVENTIL) (2.5 MG/3ML) 0.083% nebulizer solution Take 3 mLs (2.5 mg total) by nebulization every 4 (four) hours as needed for wheezing or shortness of breath. 09/28/16  Yes Milagros LollSudini, Srikar, MD  aspirin EC 81 MG tablet Take 81 mg by mouth daily.   Yes [provider]  benzonatate (TESSALON) 100 MG capsule Take 1 capsule (100 mg total) by mouth 3 (three) times daily. 09/25/16  Yes Enid BaasKalisetti, Radhika,  MD  Cholecalciferol (VITAMIN D3) 1000 UNITS CAPS Take 1,000 Units by mouth daily.   Yes [provider]  feeding supplement (BOOST / RESOURCE BREEZE) LIQD Take 1 Container by mouth 3 (three) times daily between meals. 11/02/15  Yes Katharina CaperVaickute, Rima, MD  isosorbide mononitrate (IMDUR) 30 MG 24 hr tablet Take 1 tablet (30 mg total) by mouth daily. 11/02/15  Yes Katharina CaperVaickute, Rima, MD  magnesium hydroxide (MILK OF MAGNESIA) 400 MG/5ML suspension Take 30 mLs by mouth 2 (two) times daily as needed for mild constipation or moderate constipation. 11/02/15  Yes Katharina CaperVaickute, Rima, MD  Melatonin 5 MG CAPS Take 10 mg by mouth at bedtime.   Yes [provider]  nitroGLYCERIN (NITROSTAT) 0.4 MG SL tablet Place 0.4 mg under the tongue every 5 (five) minutes as needed for chest pain. Reported on 10/19/2015   Yes [provider]  pantoprazole (PROTONIX) 40 MG tablet Take 1 tablet (40 mg total) by mouth 2 (two) times daily before a meal. 09/25/16  Yes Enid BaasKalisetti, Radhika, MD  pravastatin (PRAVACHOL) 80 MG tablet Take 40 mg by mouth daily.   Yes [provider]  senna-docusate (SENOKOT-S) 8.6-50 MG tablet Take 2 tablets by mouth 2 (two) times daily. 09/08/16  Yes Sharman CheekStafford, Phillip, MD  tamsulosin (FLOMAX) 0.4 MG CAPS capsule Take 0.4 mg by mouth daily.    Yes [provider]  tiotropium (SPIRIVA) 18 MCG inhalation capsule Place 1 capsule (18 mcg total) into inhaler and inhale every evening. 10/09/15 11/11/16 Yes Mody, Patricia PesaSital, MD  chlorpheniramine-HYDROcodone (TUSSIONEX) 10-8 MG/5ML SUER Take 5 mLs by mouth every 12 (twelve) hours. Patient not taking: Reported on 10/18/2016 09/25/16   Enid BaasKalisetti, Radhika, MD  oxybutynin (DITROPAN-XL) 5 MG 24 hr tablet Take as needed for bladder spasms up to three times a day Patient not taking: Reported on 11/11/2016 07/27/16   Michiel CowboyMcGowan, Shannon A, PA-C      VITAL SIGNS:  Blood pressure (!) 157/76, pulse 99, temperature 98.4 F (36.9 C), temperature source Oral,  resp. rate 16, height 5\' 6"  (1.676 m), weight 57.6 kg (127 lb), SpO2 96 %.  PHYSICAL EXAMINATION:  GENERAL:  [age] y.o.-year-old patient lying in the bed with no acute distress.  EYES: Pupils equal, round, reactive to light and accommodation. No scleral icterus. Extraocular muscles intact.  HEENT: Head atraumatic, normocephalic. Oropharynx and nasopharynx clear.  NECK:  Supple, no jugular venous distention. No thyroid enlargement, no tenderness.  LUNGS: Normal breath sounds bilaterally, no wheezing, rales,rhonchi or crepitation. No use of accessory muscles of respiration.  CARDIOVASCULAR: S1, S2 normal. No murmurs, rubs, or gallops.  ABDOMEN: Soft, nontender, nondistended. Bowel sounds present. No organomegaly or mass.  EXTREMITIES: No pedal edema, cyanosis, or clubbing.  NEUROLOGIC: Cranial nerves II through XII are intact. Muscle strength 5/5 in all extremities. Sensation intact. Gait not checked.  PSYCHIATRIC: The patient  is alert and oriented x 3.  SKIN: No obvious rash, lesion, or ulcer.   LABORATORY PANEL:   CBC  Recent Labs Lab 11/11/16 0737  WBC 15.7*  HGB 14.0  HCT 41.6  PLT 137*   ------------------------------------------------------------------------------------------------------------------  Chemistries   Recent Labs Lab 11/11/16 0737  NA 134*  K 4.3  CL 101  CO2 27  GLUCOSE 119*  BUN 19  CREATININE 1.09  CALCIUM 9.3   ------------------------------------------------------------------------------------------------------------------  Cardiac Enzymes  Recent Labs Lab 11/11/16 0737  TROPONINI 0.03*   ------------------------------------------------------------------------------------------------------------------  RADIOLOGY:  Dg Chest 2 View  Result Date: 11/11/2016 CLINICAL DATA:  Shortness of breath and cough EXAM: CHEST  2 VIEW COMPARISON:  October 18, 2016 FINDINGS: There is mild bibasilar atelectatic change. There is no edema or consolidation. There  is evidence of upper lobe bullous disease. Heart is upper normal in size. The pulmonary vascularity reflects the underlying bullous disease. No adenopathy. There is aortic atherosclerosis. No bone lesions. IMPRESSION: Upper lobe bullous disease. Bibasilar atelectasis. No edema or consolidation. Heart upper normal in size. There is aortic atherosclerosis. Aortic Atherosclerosis (ICD10-I70.0). Electronically Signed   By: Bretta BangWilliam  Woodruff III M.D.   On: 11/11/2016 08:00    EKG:    IMPRESSION AND PLAN:  Rogue BussingGeorge Muckey  is a [age] y.o. male with a known history of COPD, hypertension comes in the emergency room from home with increasing shortness of breath and cough with mild productive phlegm and sore throat for 2 days. Patient denies any fever.  1. Acute on chronic respiratory failure secondary to COPD exacerbation. -Admit to medical floor. -IV Solu-Medrol 60 mg daily. -Empiric doxycycline 100 mg twice a day -Nebs, inhalers -Oxygen as needed  2. Hypertension continue home meds  3. Chronic hyponatremia stable.  4. Mild leukocytosis secondary to URI/acute mild bronchitis -Doxycycline  5. DVT prophylaxis subcutaneous Lovenox   No family in the room  All the records are reviewed and case discussed with ED provider. Management plans discussed with the patient, family and they are in agreement.  CODE STATUS: DO NOT RESUSCITATE TOTAL TIME TAKING CARE OF THIS PATIENT: 45 minutes.    Arcola Freshour M.D on 11/11/2016 at 3:03 PM  Between 7am to 6pm - Pager - [phone]  After 6pm go to [email or web address] - password [email or web address] [facility]  [facility]  Office  [phone]  CC: Primary care physician; Daniel KernBliss, Laura K, MD

## 2016-11-11 NOTE — Evaluation (Signed)
Physical Therapy Evaluation Patient Details Name: Calton GoldsGeorge Henry Barreiro MRN: [id number] DOB: 10/17/1912 Today's Date: 11/11/2016   History of Present Illness  [age] y.o. male with history of COPD, chronic respiratory failure, chronic wheezing, hypertension, hyponatremia [facility] with acute on chronic COPD, has also had issues with right leg pain and ulcer.   Clinical Impression  Pt initially not wanting to do much PT but did agree to do some limited walking to show that he could go home.  He displayed age appropriate strength and generally states that he feels close to his baseline but is worried about his breathing and his R calf wound.  Pt would likely benefit from HHPT but stated that he is not at all interested in having PT at home.  Pt states his daughter and other assistance that he has will be enough.    Follow Up Recommendations Home health PT (per progress, pt reports he is not interested in PT)    Equipment Recommendations       Recommendations for Other Services       Precautions / Restrictions Precautions Precautions: Fall Restrictions Weight Bearing Restrictions: No      Mobility  Bed Mobility Overal bed mobility: Modified Independent                Transfers Overall transfer level: Modified independent Equipment used: Rolling walker (2 wheeled)             General transfer comment: Pt showed good effort and confidence getting to standing  Ambulation/Gait Ambulation/Gait assistance: Supervision Ambulation Distance (Feet): 45 Feet Assistive device: Rolling walker (2 wheeled)       General Gait Details: Pt is heavily reliant on the walker, but ultimately reports being close to his baseline of limited in-home distances.  Pt had no LOBs but did fatigue with the effort (O2 dropped to 90% on room air)  Stairs            Wheelchair Mobility    Modified Rankin (Stroke Patients Only)       Balance Overall balance assessment: Modified  Independent                                           Pertinent Vitals/Pain Pain Assessment: 0-10 Pain Score: 5  Pain Location: R LE, at wound area    Home Living Family/patient expects to be discharged to:: Private residence Living Arrangements: Children Available Help at Discharge: Family;Personal care attendant Type of Home: House Home Access: Ramped entrance     Home Layout: One level Home Equipment: Environmental consultantWalker - 2 wheels;Walker - 4 wheels;Grab bars - toilet;Wheelchair - manual;Bedside commode Additional Comments: Chronic foley    Prior Function Level of Independence: Needs assistance   Gait / Transfers Assistance Needed: Pt ambulates limited household distances with rolling walker. He has had a few falls.     Comments: Pt modified independent with limited household mobility with RW.  Lives with son with Down Syndrome.  Daughter lives next door (frequently checks in and makes supper every night).  Has aide M-F for 2 hours.     Hand Dominance        Extremity/Trunk Assessment   Upper Extremity Assessment Upper Extremity Assessment: Generalized weakness (unable to elevate shoulders over head)    Lower Extremity Assessment Lower Extremity Assessment: Generalized weakness (grossly 3+/5, pain limited )  Communication   Communication: HOH  Cognition Arousal/Alertness: Awake/alert Behavior During Therapy: WFL for tasks assessed/performed Overall Cognitive Status: Within Functional Limits for tasks assessed                                        General Comments      Exercises     Assessment/Plan    PT Assessment Patient needs continued PT services  PT Problem List Decreased range of motion;Decreased activity tolerance;Decreased balance;Pain;Decreased strength;Decreased mobility;Decreased coordination;Decreased knowledge of use of DME;Decreased safety awareness;Cardiopulmonary status limiting activity       PT Treatment  Interventions DME instruction;Gait training;Functional mobility training;Therapeutic activities;Therapeutic exercise;Balance training;Neuromuscular re-education;Cognitive remediation;Patient/family education    PT Goals (Current goals can be found in the Care Plan section)  Acute Rehab PT Goals Patient Stated Goal: go home, figure out breathing treatment dosing PT Goal Formulation: With patient Time For Goal Achievement: 11/25/16 Potential to Achieve Goals: Fair    Frequency Min 2X/week   Barriers to discharge        Co-evaluation               AM-PAC PT "6 Clicks" Daily Activity  Outcome Measure Difficulty turning over in bed (including adjusting bedclothes, sheets and blankets)?: None Difficulty moving from lying on back to sitting on the side of the bed? : A Little Difficulty sitting down on and standing up from a chair with arms (e.g., wheelchair, bedside commode, etc,.)?: A Little Help needed moving to and from a bed to chair (including a wheelchair)?: A Little Help needed walking in hospital room?: A Little Help needed climbing 3-5 steps with a railing? : A Lot 6 Click Score: 18    End of Session Equipment Utilized During Treatment: Gait belt;Oxygen (1 liter in bed, sats mid/high 90s, post ambulation on RA 90%) Activity Tolerance: Patient limited by fatigue;Patient tolerated treatment well Patient left: with bed alarm set;with call bell/phone within reach   PT Visit Diagnosis: Muscle weakness (generalized) (M62.81);Difficulty in walking, not elsewhere classified (R26.2)    Time: [id number] PT Time Calculation (min) (ACUTE ONLY): 28 min   Charges:   PT Evaluation $PT Eval Low Complexity: 1 Low     PT G Codes:   PT G-Codes **NOT FOR INPATIENT CLASS** Functional Assessment Tool Used: AM-PAC 6 Clicks Basic Mobility Functional Limitation: Mobility: Walking and moving around Mobility: Walking and Moving Around Current Status ([id number]([id number]): At least 20 percent but less  than 40 percent impaired, limited or restricted Mobility: Walking and Moving Around Goal Status [phone](G8979): At least 1 percent but less than 20 percent impaired, limited or restricted    Malachi ProGalen R Patryce Depriest, DPT 11/11/2016, 4:35 PM

## 2016-11-11 NOTE — Care Management Note (Signed)
Case Management Note  Patient Details  Name: Daniel Dickson MRN: [id number] Date of Birth: 10/18/1912  Subjective/Objective:    Admitted to [facility] under observation status with the diagnosis of respiratory distress. Lives alone. Daughter is Chase Picketatricia Armstrong [phone]). Daughter lives beside of him. Last seen Dr. Dareen PianoAnderson 11/09/16. Prescrip[facility].    States [facility] in over a year. [facility] is in the home now for dressing changes and personal care on Monday-Wednesday - Friday. States he doesn't drive. Self feed. Two canes, rolling walker, nebulizer, and wheelchair in the home. Home oxygen per [facility]. Daughter helps with meals. Family will transport.            Action/Plan: Physical therapy evaluation in progress.   Expected Discharge Date:                  Expected Discharge Plan:     In-House Referral:     Discharge planning Services     Post Acute Care Choice:    Choice offered to:     DME Arranged:    DME Agency:     HH Arranged:    HH Agency:     Status of Service:     If discussed at [employer] Meetings, dates discussed:    Additional Comments:  Gwenette GreetBrenda S Breana Litts, RNMSN CCM Care Management [phone] 11/11/2016, 3:10 PM

## 2016-11-11 NOTE — ED Provider Notes (Signed)
[facility] Emergency Department Provider Note       Time seen: ----------------------------------------- 7:44 AM on 11/11/2016 -----------------------------------------     I have reviewed the triage vital signs and the nursing notes.   HISTORY   Chief Complaint Shortness of Breath    HPI Daniel Dickson is a 83 y.o. male who presents to the ED for shortness of breath and cough that started yesterday. Patient arrives from home via EMS for same. Patient denies any chest pain but still has cough that is nonproductive. Patient states he has difficulty eating because of the cough. He states the breathing treatment in route helped his symptoms some.   Past Medical History:  Diagnosis Date  . Anxiety   . COPD (chronic obstructive pulmonary disease) (HCC)   . Coronary artery disease    a. 2012 s/p MI/cardiac arrest--> PCI mRCA.  Marland Kitchen Hypertension   . Hypertensive heart disease   . Mixed hyperlipidemia   . PTSD (post-traumatic stress disorder)    with headaches    Patient Active Problem List   Diagnosis Date Noted  . Open wound of lower limb with tendon involvement, right, subsequent encounter   . Cellulitis of right leg 10/18/2016  . DNR (do not resuscitate) discussion   . Palliative care by specialist   . Adult failure to thrive   . Gross hematuria   . Acute respiratory failure (HCC) 02/18/2016  . Urinary retention 11/10/2015  . Hyperkalemia 11/02/2015  . Hypotension 11/02/2015  . Dysphagia, pharyngoesophageal phase 11/02/2015  . Esophageal spasm 11/02/2015  . Constipation 11/02/2015  . SIADH (syndrome of inappropriate ADH production) (HCC) 11/02/2015  . Urinary retention due to benign prostatic hyperplasia 11/02/2015  . Chronic indwelling Foley catheter 11/02/2015  . Bacteriuria with pyuria 11/02/2015  . Protein-calorie malnutrition, severe 10/30/2015  . ARF (acute renal failure) (HCC) 10/29/2015  . Dyspnea 10/07/2015  . Hyponatremia  06/06/2015  . Coronary artery disease   . Hypertensive heart disease   . Mixed hyperlipidemia   . COPD (chronic obstructive pulmonary disease) (HCC)   . COPD exacerbation (HCC) 01/11/2015  . Moderate COPD (chronic obstructive pulmonary disease) (HCC) 12/08/2014  . Fall 09/03/2014  . Hip fracture (HCC) 09/03/2014  . PTSD (post-traumatic stress disorder) 09/03/2014  . Bilateral leg edema 09/03/2014  . SOB (shortness of breath) 10/31/2012  . Essential hypertension 10/31/2012  . CAD (coronary artery disease) 10/31/2012  . Hyperlipidemia 10/31/2012  . Tachycardia 10/31/2012    Past Surgical History:  Procedure Laterality Date  . CATARACT EXTRACTION    . CHOLECYSTECTOMY    . CORONARY ANGIOPLASTY     with stent; [facility]  . HAND SURGERY    . HEMORRHOID SURGERY    . HIP SURGERY    . VASCULAR SURGERY     stent R femoral artery    Allergies Cortisone; Dye fdc red [red dye]; Iodine (kelp)  [iodine]; Peanut oil; and Peanut-containing drug products  Social History Social History  Substance Use Topics  . Smoking status: Former Smoker    Packs/day: 3.00    Years: 40.00    Types: Cigarettes  . Smokeless tobacco: Never Used     Comment: quit 62 years  . Alcohol use 1.8 oz/week    3 Glasses of wine per week     Comment: wine at night.     Review of Systems Constitutional: Negative for fever. Cardiovascular: Negative for chest pain. Respiratory: Positive shortness of breath and cough Gastrointestinal: Negative for abdominal pain, vomiting and diarrhea. Genitourinary:  Negative for dysuria. Musculoskeletal: Negative for back pain. Skin: Negative for rash. Neurological: Negative for headaches, focal weakness or numbness.  All systems negative/normal/unremarkable except as stated in the HPI  ____________________________________________   PHYSICAL EXAM:  VITAL SIGNS: ED Triage Vitals  Enc Vitals Group     BP 11/11/16 0739 (!) 163/114     Pulse Rate 11/11/16 0739 (!) 108      Resp 11/11/16 0739 20     Temp 11/11/16 0739 98.2 F (36.8 C)     Temp Source 11/11/16 0739 Oral     SpO2 11/11/16 0739 93 %     Weight 11/11/16 0734 126 lb (57.2 kg)     Height 11/11/16 0734 5\' 6"  (1.676 m)     Head Circumference --      Peak Flow --      Pain Score --      Pain Loc --      Pain Edu? --      Excl. in GC? --    Constitutional: Alert and oriented. Well appearing and in no distress. Eyes: Conjunctivae are normal. Normal extraocular movements. ENT   Head: Normocephalic and atraumatic.   Nose: No congestion/rhinnorhea.   Mouth/Throat: Mucous membranes are moist.   Neck: No stridor. Cardiovascular: Rapid rate, regular rhythm. No murmurs, rubs, or gallops. Respiratory: Normal respiratory effort without tachypnea nor retractions. Mild wheezing Gastrointestinal: Soft and nontender. Normal bowel sounds Musculoskeletal: Nontender with normal range of motion in extremities. No lower extremity tenderness nor edema. Neurologic:  Normal speech and language. No gross focal neurologic deficits are appreciated.  Skin:  Skin is warm, dry and intact. No rash noted. Psychiatric: Mood and affect are normal. Speech and behavior are normal.  ____________________________________________  EKG: Interpreted by me. Sinus tachycardia at a rate of 170s per minute, normal PR interval, wide QRS, right bundle branch block, nonspecific ST changes  ____________________________________________  ED COURSE:  Pertinent labs & imaging results that were available during my care of the patient were reviewed by me and considered in my medical decision making (see chart for details). Patient presents for shortness of breath and cough, we will assess with labs and imaging as indicated.   Procedures ____________________________________________   LABS (pertinent positives/negatives)  Labs Reviewed  CBC - Abnormal; Notable for the following:       Result Value   WBC 15.7 (*)    RBC 4.28  (*)    RDW 15.5 (*)    Platelets 137 (*)    All other components within normal limits  BASIC METABOLIC PANEL  TROPONIN I    RADIOLOGY Images were viewed by me  Chest x-ray IMPRESSION: Upper lobe bullous disease. Bibasilar atelectasis. No edema or consolidation. Heart upper normal in size. There is aortic atherosclerosis.  Aortic Atherosclerosis (ICD10-I70.0). ____________________________________________  FINAL ASSESSMENT AND PLAN  Dyspnea, COPD exacerbation  Plan: Patient's labs and imaging were dictated above. Patient had presented for shortness of breath with wheezing. He was given DuoNeb since steroids, patient states he would prefer to come in the hospital for observation. Troponin is slightly elevated which will need to be rechecked. He is stable for admission at this time.   Emily FilbertWilliams, Guadalupe Nickless E, MD   Note: This note was generated in part or whole with voice recognition software. Voice recognition is usually quite accurate but there are transcription errors that can and very often do occur. I apologize for any typographical errors that were not detected and corrected.     Daryel NovemberWilliams, Morey Andonian  E, MD 11/11/16 [phone]

## 2016-11-12 MED ORDER — ORAL CARE MOUTH RINSE
15.0000 mL | Freq: Two times a day (BID) | OROMUCOSAL | Status: DC
  Administered 2016-11-12: 09:00:00 15 mL via OROMUCOSAL

## 2016-11-12 MED ORDER — HYDRALAZINE HCL 20 MG/ML IJ SOLN
10.0000 mg | INTRAMUSCULAR | Status: DC | PRN
  Administered 2016-11-12: 07:00:00 10 mg via INTRAVENOUS
  Filled 2016-11-12: qty 1

## 2016-11-12 MED ORDER — MENTHOL 3 MG MT LOZG
1.0000 | LOZENGE | OROMUCOSAL | Status: DC | PRN
  Filled 2016-11-12: qty 9

## 2016-11-12 MED ORDER — GUAIFENESIN-DM 100-10 MG/5ML PO SYRP
5.0000 mL | ORAL_SOLUTION | ORAL | Status: DC | PRN
  Filled 2016-11-12 (×2): qty 5

## 2016-11-12 NOTE — Progress Notes (Signed)
Patient is somewhat confused and keeps having to be reminded that he is in the hospital.  Patient did not sleep well last night and has been awake most of the day today.  Made Dr. Enedina FinnerSona Patel aware of new onset of confusion.  No new orders.  Will continue to monitor.  Daniel Apeanielle Tamiya Colello, RN

## 2016-11-12 NOTE — Progress Notes (Signed)
Patient verbalized to this nurse and Dr. Enedina FinnerSona Patel that he no longer wishes to be a DNR and would like his code status changed to full code.  Verbal order given by Dr. Enedina FinnerSona Patel to changed code status to full code.  Purple DNR bracelet was cut off patient's wrist and order was changed to full code.  Daniel Apeanielle Chae Oommen, RN

## 2016-11-12 NOTE — Progress Notes (Signed)
[facility] [place] [facility]   PATIENT NAME: Daniel Dickson    MR#:  [id number]  DATE OF BIRTH:  09/23/1912  SUBJECTIVE:  Pt loves to talk!! Weak Did not eat since forgot his dentures  REVIEW OF SYSTEMS:   Review of Systems  Constitutional: Negative for chills, fever and weight loss.  HENT: Positive for sore throat. Negative for ear discharge, ear pain and nosebleeds.   Eyes: Negative for blurred vision, pain and discharge.  Respiratory: Positive for cough. Negative for sputum production, shortness of breath, wheezing and stridor.   Cardiovascular: Negative for chest pain, palpitations, orthopnea and PND.  Gastrointestinal: Negative for abdominal pain, diarrhea, nausea and vomiting.  Genitourinary: Negative for frequency and urgency.  Musculoskeletal: Negative for back pain and joint pain.  Neurological: Positive for weakness. Negative for sensory change, speech change and focal weakness.  Psychiatric/Behavioral: Negative for depression and hallucinations. The patient is not nervous/anxious.    Tolerating Diet:yes Tolerating PT: HHPT  DRUG ALLERGIES:   Allergies  Allergen Reactions  . Iodine (Kelp) [Iodine] Other (See Comments)    Other reaction(s): Other (See Comments) GI Upset    VITALS:  Blood pressure 135/71, pulse (!) 106, temperature (!) 97.5 F (36.4 C), temperature source Oral, resp. rate 16, height 5\' 6"  (1.676 m), weight 57.6 kg (127 lb), SpO2 98 %.  PHYSICAL EXAMINATION:   Physical Exam  GENERAL:  [age] y.o.-year-old patient lying in the bed with no acute distress.  EYES: Pupils equal, round, reactive to light and accommodation. No scleral icterus. Extraocular muscles intact.  HEENT: Head atraumatic, normocephalic. Oropharynx and nasopharynx clear.  NECK:  Supple, no jugular venous distention. No thyroid enlargement, no tenderness.  LUNGS: Distant breath sounds bilaterally, no wheezing, rales, rhonchi. No use of accessory muscles  of respiration.  CARDIOVASCULAR: S1, S2 normal. No murmurs, rubs, or gallops.  ABDOMEN: Soft, nontender, nondistended. Bowel sounds present. No organomegaly or mass.  EXTREMITIES: No cyanosis, clubbing or edema b/l.    NEUROLOGIC: Cranial nerves II through XII are intact. No focal Motor or sensory deficits b/l.   PSYCHIATRIC:  patient is alert and oriented x 3.  SKIN: No obvious rash, lesion, or ulcer.   LABORATORY PANEL:  CBC  Recent Labs Lab 11/11/16 0737  WBC 15.7*  HGB 14.0  HCT 41.6  PLT 137*    Chemistries   Recent Labs Lab 11/11/16 0737  NA 134*  K 4.3  CL 101  CO2 27  GLUCOSE 119*  BUN 19  CREATININE 1.09  CALCIUM 9.3   Cardiac Enzymes  Recent Labs Lab 11/11/16 0737  TROPONINI 0.03*   RADIOLOGY:  Dg Chest 2 View  Result Date: 11/11/2016 CLINICAL DATA:  Shortness of breath and cough EXAM: CHEST  2 VIEW COMPARISON:  October 18, 2016 FINDINGS: There is mild bibasilar atelectatic change. There is no edema or consolidation. There is evidence of upper lobe bullous disease. Heart is upper normal in size. The pulmonary vascularity reflects the underlying bullous disease. No adenopathy. There is aortic atherosclerosis. No bone lesions. IMPRESSION: Upper lobe bullous disease. Bibasilar atelectasis. No edema or consolidation. Heart upper normal in size. There is aortic atherosclerosis. Aortic Atherosclerosis (ICD10-I70.0). Electronically Signed   By: Bretta BangWilliam  Woodruff III M.D.   On: 11/11/2016 08:00   ASSESSMENT AND PLAN:  Daniel BussingGeorge Steinhoff  is a [age] y.o. male with a known history of COPD, hypertension comes in the emergency room from home with increasing shortness of breath and cough with mild productive phlegm  and sore throat for 2 days. Patient denies any fever.  1. Acute on chronic respiratory failure secondary to COPD exacerbation. -IV Solu-Medrol 60 mg daily. -Empiric doxycycline 100 mg twice a day -Nebs, inhalers -Oxygen as needed  2. Hypertension continue home  meds  3. Chronic hyponatremia stable.  4. Mild leukocytosis secondary to URI/acute mild bronchitis -Doxycycline  5. DVT prophylaxis subcutaneous Lovenox  PT recommends HHPT Pt requesting to stay one more day.  Case discussed with Care Management/Social Worker. Management plans discussed with the patient, family and they are in agreement.  CODE STATUS: FULL  DVT Prophylaxis: Lovenox  TOTAL TIME TAKING CARE OF THIS PATIENT:  30 minutes.  >50% time spent on counselling and coordination of care  POSSIBLE D/C IN 1 DAYS, DEPENDING ON CLINICAL CONDITION.  Note: This dictation was prepared with Dragon dictation along with smaller phrase technology. Any transcriptional errors that result from this process are unintentional.  Orley Lawry M.D on 11/12/2016 at 11:49 AM  Between 7am to 6pm - Pager - [phone]  After 6pm go to [email or web address] - password [email or web address]  [facility]  Office  [phone]  CC: Primary care physician; Dortha KernBliss, Laura K, MD

## 2016-11-12 NOTE — Progress Notes (Signed)
Patient's daughter brought in both of patient's hearing aids, volume control for hearing aid, cell phone charger and upper & lower dentures.  Orson Apeanielle Taniya Dasher, RN

## 2016-11-12 NOTE — Progress Notes (Signed)
Patient not eating due to his dentures not being at the hospital with him.  Verbal order given by Dr. Enedina FinnerSona Patel to change patient's diet to Dys 1. Daniel Apeanielle Haidee Stogsdill, RN

## 2016-11-13 MED ORDER — DOXYCYCLINE HYCLATE 100 MG PO TABS: 100.0000 mg | ORAL_TABLET | Freq: Two times a day (BID) | ORAL | 0 refills | Status: DC

## 2016-11-13 MED ORDER — BENZONATATE 100 MG PO CAPS: 100.0000 mg | ORAL_CAPSULE | Freq: Three times a day (TID) | ORAL | 1 refills | Status: DC

## 2016-11-13 MED ORDER — PREDNISONE 50 MG PO TABS
50.0000 mg | ORAL_TABLET | Freq: Every day | ORAL | Status: DC
  Administered 2016-11-13: 09:00:00 50 mg via ORAL
  Filled 2016-11-13: qty 1

## 2016-11-13 MED ORDER — PREDNISONE 10 MG PO TABS: ORAL_TABLET | ORAL | 0 refills | Status: DC

## 2016-11-13 NOTE — Care Management Note (Signed)
Case Management Note  Patient Details  Name: Daniel Dickson MRN: [id number] Date of Birth: 09/04/1912  Subjective/Objective:     [facility]  Order for Mr Daniel Dickson is discharging home today. No other home needs identified.               Action/Plan:   Expected Discharge Date:  11/13/16               Expected Discharge Plan:   11/13/16  In-House Referral:     Discharge planning Services     Post Acute Care Choice:    Choice offered to:     DME Arranged:    DME Agency:     HH Arranged:   PT [facility]   [facility]  Status of Service:   Completed  If discussed at Long Length of Stay Meetings, dates discussed:    Additional Comments:  Daniel Dickson A, RN 11/13/2016, 10:06 AM

## 2016-11-13 NOTE — Discharge Summary (Signed)
[facility] [place] [facility]   PATIENT NAME: Daniel BussingGeorge Dickson    MR#:  [id number]  DATE OF BIRTH:  03/12/1913  DATE OF ADMISSION:  11/11/2016 ADMITTING PHYSICIAN: Enedina FinnerSona Tata Timmins, MD  DATE OF DISCHARGE: 11/13/2016  PRIMARY CARE PHYSICIAN: Dortha KernBliss, Laura K, MD    ADMISSION DIAGNOSIS:  COPD exacerbation (HCC) [J44.1]  DISCHARGE DIAGNOSIS:  Acute on chronic respiratory failure secondary to COPD exacerbation Acute mild bronchitis BPH with chronic Foley  SECONDARY DIAGNOSIS:   Past Medical History:  Diagnosis Date  . Anxiety   . COPD (chronic obstructive pulmonary disease) (HCC)   . Coronary artery disease    a. 2012 s/p MI/cardiac arrest--> PCI mRCA.  Marland Kitchen. Hypertension   . Hypertensive heart disease   . Mixed hyperlipidemia   . PTSD (post-traumatic stress disorder)    with headaches    HOSPITAL COURSE:   Daniel ParkinsonGeorge Whiteis a [age] y.o. malewith a known history of COPD, hypertension comes in the emergency room from home with increasing shortness of breath and cough with mild productive phlegm and sore throat for 2 days. Patient denies any fever.  1. Acute on chronic respiratory failure secondary to COPD exacerbation. -IV Solu-Medrol 60 mg daily.---Changed to oral taper -Empiric doxycycline 100 mg twice a day -Nebs, inhalers -Oxygen as needed  2. Hypertension continue home meds  3. Chronic hyponatremia stable.  4. Mild leukocytosis secondary to URI/acute mild bronchitis -Doxycycline  5. DVT prophylaxis subcutaneous Lovenox  6. BPH patient has chronic Foley  PT recommends HHPT  -overall improved. Patient will discharge to home  CONSULTS OBTAINED:    DRUG ALLERGIES:   Allergies  Allergen Reactions  . Iodine (Kelp) [Iodine] Other (See Comments)    Other reaction(s): Other (See Comments) GI Upset    DISCHARGE MEDICATIONS:   Current Discharge Medication List    START taking these medications   Details  doxycycline (VIBRA-TABS) 100 MG  tablet Take 1 tablet (100 mg total) by mouth every 12 (twelve) hours. Qty: 10 tablet, Refills: 0    predniSONE (DELTASONE) 10 MG tablet Start with 50 mg daily. Taper by 10 mg daily then stop Qty: 15 tablet, Refills: 0      CONTINUE these medications which have CHANGED   Details  benzonatate (TESSALON) 100 MG capsule Take 1 capsule (100 mg total) by mouth 3 (three) times daily. Qty: 25 capsule, Refills: 1      CONTINUE these medications which have NOT CHANGED   Details  acetaminophen (TYLENOL) 500 MG tablet Take 500 mg by mouth daily as needed for mild pain or moderate pain.     albuterol (PROAIR HFA) 108 (90 BASE) MCG/ACT inhaler Inhale 2 puffs into the lungs every 6 (six) hours as needed. For wheezing.    albuterol (PROVENTIL) (2.5 MG/3ML) 0.083% nebulizer solution Take 3 mLs (2.5 mg total) by nebulization every 4 (four) hours as needed for wheezing or shortness of breath. Qty: 200 mL, Refills: 12    aspirin EC 81 MG tablet Take 81 mg by mouth daily.    Cholecalciferol (VITAMIN D3) 1000 UNITS CAPS Take 1,000 Units by mouth daily.    feeding supplement (BOOST / RESOURCE BREEZE) LIQD Take 1 Container by mouth 3 (three) times daily between meals. Qty: 90 Container, Refills: 5    isosorbide mononitrate (IMDUR) 30 MG 24 hr tablet Take 1 tablet (30 mg total) by mouth daily. Qty: 30 tablet, Refills: 5    magnesium hydroxide (MILK OF MAGNESIA) 400 MG/5ML suspension Take 30 mLs by  mouth 2 (two) times daily as needed for mild constipation or moderate constipation. Qty: 360 mL, Refills: 0    Melatonin 5 MG CAPS Take 10 mg by mouth at bedtime.    nitroGLYCERIN (NITROSTAT) 0.4 MG SL tablet Place 0.4 mg under the tongue every 5 (five) minutes as needed for chest pain. Reported on 10/19/2015    pantoprazole (PROTONIX) 40 MG tablet Take 1 tablet (40 mg total) by mouth 2 (two) times daily before a meal. Qty: 60 tablet, Refills: 2    pravastatin (PRAVACHOL) 80 MG tablet Take 40 mg by mouth  daily.    senna-docusate (SENOKOT-S) 8.6-50 MG tablet Take 2 tablets by mouth 2 (two) times daily. Qty: 120 tablet, Refills: 0    tamsulosin (FLOMAX) 0.4 MG CAPS capsule Take 0.4 mg by mouth daily.     tiotropium (SPIRIVA) 18 MCG inhalation capsule Place 1 capsule (18 mcg total) into inhaler and inhale every evening. Qty: 30 capsule, Refills: 12        If you experience worsening of your admission symptoms, develop shortness of breath, life threatening emergency, suicidal or homicidal thoughts you must seek medical attention immediately by calling 911 or calling your MD immediately  if symptoms less severe.  You Must read complete instructions/literature along with all the possible adverse reactions/side effects for all the Medicines you take and that have been prescribed to you. Take any new Medicines after you have completely understood and accept all the possible adverse reactions/side effects.   Please note  You were cared for by a hospitalist during your hospital stay. If you have any questions about your discharge medications or the care you received while you were in the hospital after you are discharged, you can call the unit and asked to speak with the hospitalist on call if the hospitalist that took care of you is not available. Once you are discharged, your primary care physician will handle any further medical issues. Please note that NO REFILLS for any discharge medications will be authorized once you are discharged, as it is imperative that you return to your primary care physician (or establish a relationship with a primary care physician if you do not have one) for your aftercare needs so that they can reassess your need for medications and monitor your lab values. Today   SUBJECTIVE  I slept wearing good I feel good   VITAL SIGNS:  Blood pressure (!) 162/86, pulse 98, temperature (!) 97.5 F (36.4 C), temperature source Oral, resp. rate 16, height 5\' 6"  (1.676 m), weight  57.6 kg (127 lb), SpO2 99 %.  I/O:   Intake/Output Summary (Last 24 hours) at 11/13/16 0839 Last data filed at 11/13/16 0646  Gross per 24 hour  Intake              480 ml  Output             1350 ml  Net             -870 ml    PHYSICAL EXAMINATION:  GENERAL:  83 y.o.-year-old patient lying in the bed with no acute distress.  EYES: Pupils equal, round, reactive to light and accommodation. No scleral icterus. Extraocular muscles intact.  HEENT: Head atraumatic, normocephalic. Oropharynx and nasopharynx clear.  NECK:  Supple, no jugular venous distention. No thyroid enlargement, no tenderness.  LUNGS Distant  breath sounds bilaterally, no wheezing, rales,rhonchi or crepitation. No use of accessory muscles of respiration. Emphysematous chest  CARDIOVASCULAR: S1, S2 normal. No  murmurs, rubs, or gallops.  ABDOMEN: Soft, non-tender, non-distended. Bowel sounds present. No organomegaly or mass.  EXTREMITIES: No pedal edema, cyanosis, or clubbing.  NEUROLOGIC: Cranial nerves II through XII are intact. Muscle strength 5/5 in all extremities. Sensation intact. Gait not checked.  PSYCHIATRIC: The patient is alert and oriented x 3.  SKIN: No obvious rash, lesion, or ulcer.   DATA REVIEW:   CBC   Recent Labs Lab 11/11/16 0737  WBC 15.7*  HGB 14.0  HCT 41.6  PLT 137*    Chemistries   Recent Labs Lab 11/11/16 0737  NA 134*  K 4.3  CL 101  CO2 27  GLUCOSE 119*  BUN 19  CREATININE 1.09  CALCIUM 9.3    Microbiology Results   No results found for this or any previous visit (from the past 240 hour(s)).  RADIOLOGY:  No results found.   Management plans discussed with the patient, family and they are in agreement.  CODE STATUS:     Code Status Orders        Start     Ordered   11/12/16 0932  Full code  Continuous     11/12/16 0932    Code Status History    Date Active Date Inactive Code Status Order ID Comments User Context   11/11/2016 11:23 AM 11/12/2016  9:32 AM  DNR [id number]  Enedina FinnerPatel, Jakeisha Stricker, MD Inpatient   10/18/2016  8:36 PM 10/20/2016  7:52 PM DNR [id number]  Milagros LollSudini, Srikar, MD ED   09/27/2016  3:00 PM 09/28/2016  5:19 PM DNR [id number]  Altamese DillingVachhani, Vaibhavkumar, MD Inpatient   09/24/2016  8:52 AM 09/27/2016  3:00 PM Full Code [id number]  Enid BaasKalisetti, Radhika, MD Inpatient   08/30/2016  5:16 AM 09/02/2016  4:04 PM Full Code [id number]  Arnaldo Nataliamond, Michael S, MD Inpatient   02/18/2016  8:27 AM 02/22/2016  6:50 PM Full Code [id number]  Enedina FinnerPatel, Gwen Sarvis, MD Inpatient   10/29/2015  9:19 PM 11/02/2015  2:22 PM Full Code [id number]  Enid BaasKalisetti, Radhika, MD Inpatient   10/07/2015  7:32 AM 10/09/2015  4:29 PM Full Code [id number]  Ihor AustinPyreddy, Pavan, MD Inpatient   06/07/2015 12:04 AM 06/10/2015  4:31 PM Full Code [id number]  Oralia ManisWillis, David, MD Inpatient   01/11/2015  1:54 PM 01/12/2015  1:58 PM Full Code [id number]  Alford HighlandWieting, Richard, MD ED      TOTAL TIME TAKING CARE OF THIS PATIENT: 40 minutes.    Alieyah Spader M.D on 11/13/2016 at 8:39 AM  Between 7am to 6pm - Pager - [phone] After 6pm go to [email or web address] - password [email or web address]  [facility] [place] [facility]  Office  [phone]  CC: Primary care physician; Dortha KernBliss, Laura K, MD

## 2016-11-13 NOTE — Plan of Care (Signed)
Pt has returned to baseline and being d/ced home.  Pt is A&O.  Did call out for breathing tx.  Going home on doxycycline, prednisone and a tessalon pearl for cough.  Nurse tech removed IV.  Reviewed d/c instructions w/patient and daughter.  He was picked up by daughter.

## 2016-12-31 ENCOUNTER — Emergency Department
Admission: EM | Admit: 2016-12-31 | Discharge: 2016-12-31 | Disposition: A | Discharge status: Home / Self Care | Payer: Medicare PPO | Attending: Emergency Medicine | Admitting: Emergency Medicine

## 2016-12-31 DIAGNOSIS — Z87891 Personal history of nicotine dependence: Secondary | ICD-10-CM | POA: Insufficient documentation

## 2016-12-31 DIAGNOSIS — N4889 Other specified disorders of penis: Secondary | ICD-10-CM | POA: Insufficient documentation

## 2016-12-31 DIAGNOSIS — Z79899 Other long term (current) drug therapy: Secondary | ICD-10-CM | POA: Insufficient documentation

## 2016-12-31 DIAGNOSIS — I10 Essential (primary) hypertension: Secondary | ICD-10-CM | POA: Diagnosis not present

## 2016-12-31 DIAGNOSIS — Z955 Presence of coronary angioplasty implant and graft: Secondary | ICD-10-CM | POA: Insufficient documentation

## 2016-12-31 DIAGNOSIS — J449 Chronic obstructive pulmonary disease, unspecified: Secondary | ICD-10-CM | POA: Insufficient documentation

## 2016-12-31 DIAGNOSIS — I2510 Atherosclerotic heart disease of native coronary artery without angina pectoris: Secondary | ICD-10-CM | POA: Insufficient documentation

## 2016-12-31 DIAGNOSIS — Z7982 Long term (current) use of aspirin: Secondary | ICD-10-CM | POA: Diagnosis not present

## 2016-12-31 DIAGNOSIS — E871 Hypo-osmolality and hyponatremia: Secondary | ICD-10-CM

## 2016-12-31 DIAGNOSIS — E860 Dehydration: Secondary | ICD-10-CM | POA: Diagnosis not present

## 2016-12-31 LAB — COMPREHENSIVE METABOLIC PANEL
ALBUMIN: 3.1 g/dL — AB (ref 3.5–5.0)
ALT: 22 U/L (ref 17–63)
AST: 26 U/L (ref 15–41)
Alkaline Phosphatase: 41 U/L (ref 38–126)
Anion gap: 8 (ref 5–15)
BUN: 24 mg/dL — ABNORMAL HIGH (ref 6–20)
CALCIUM: 8.6 mg/dL — AB (ref 8.9–10.3)
CO2: 23 mmol/L (ref 22–32)
CREATININE: 0.94 mg/dL (ref 0.61–1.24)
Chloride: 98 mmol/L — ABNORMAL LOW (ref 101–111)
GFR calc Af Amer: >60 mL/min (ref >60)
GFR calc non Af Amer: >60 mL/min (ref >60)
GLUCOSE: 114 mg/dL — AB (ref 65–99)
Potassium: 4.2 mmol/L (ref 3.5–5.1)
SODIUM: 129 mmol/L — AB (ref 135–145)
Total Bilirubin: 0.8 mg/dL (ref 0.3–1.2)
Total Protein: 6.3 g/dL — ABNORMAL LOW (ref 6.5–8.1)

## 2016-12-31 LAB — CBC WITH DIFFERENTIAL/PLATELET
Basophils Absolute: 0.1 10*3/uL (ref 0–0.1)
Basophils Relative: 1 %
EOS ABS: 0.5 10*3/uL (ref 0–0.7)
Eosinophils Relative: 6 %
HEMATOCRIT: 40.6 % (ref 40.0–52.0)
HEMOGLOBIN: 14 g/dL (ref 13.0–18.0)
Lymphocytes Relative: 9 %
Lymphs Abs: 0.8 10*3/uL — ABNORMAL LOW (ref 1.0–3.6)
MCH: 32.5 pg (ref 26.0–34.0)
MCHC: 34.4 g/dL (ref 32.0–36.0)
MCV: 94.4 fL (ref 80.0–100.0)
MONO ABS: 0.8 10*3/uL (ref 0.2–1.0)
MONOS PCT: 9 %
NEUTROS ABS: 6.9 10*3/uL — AB (ref 1.4–6.5)
NEUTROS PCT: 75 %
Platelets: 159 10*3/uL (ref 150–440)
RBC: 4.31 MIL/uL — ABNORMAL LOW (ref 4.40–5.90)
RDW: 14.1 % (ref 11.5–14.5)
WBC: 9.2 10*3/uL (ref 3.8–10.6)

## 2016-12-31 LAB — URINALYSIS, COMPLETE (UACMP) WITH MICROSCOPIC
Bilirubin Urine: NEGATIVE
Glucose, UA: NEGATIVE mg/dL
Ketones, ur: NEGATIVE mg/dL
Nitrite: NEGATIVE
Protein, ur: NEGATIVE mg/dL
SPECIFIC GRAVITY, URINE: 1.01 (ref 1.005–1.030)
Squamous Epithelial / LPF: NONE SEEN
pH: 7 (ref 5.0–8.0)

## 2016-12-31 LAB — LIPASE, BLOOD: Lipase: 23 U/L (ref 11–51)

## 2016-12-31 MED ORDER — ACETAMINOPHEN 500 MG PO TABS
1000.0000 mg | ORAL_TABLET | Freq: Once | ORAL | Status: AC
  Administered 2016-12-31: 1000 mg via ORAL
  Filled 2016-12-31: qty 2

## 2016-12-31 MED ORDER — IBUPROFEN 600 MG PO TABS
600.0000 mg | ORAL_TABLET | ORAL | Status: AC
  Administered 2016-12-31: 600 mg via ORAL

## 2016-12-31 MED ORDER — IBUPROFEN 600 MG PO TABS
ORAL_TABLET | ORAL | Status: AC
  Administered 2016-12-31: 600 mg via ORAL
  Filled 2016-12-31: qty 1

## 2016-12-31 MED ORDER — SODIUM CHLORIDE 0.9 % IV BOLUS (SEPSIS)
500.0000 mL | Freq: Once | INTRAVENOUS | Status: AC
  Administered 2016-12-31: 500 mL via INTRAVENOUS

## 2016-12-31 NOTE — ED Provider Notes (Signed)
[facility] Emergency Department Provider Note   ____________________________________________   I have reviewed the triage vital signs and the nursing notes.   HISTORY  Chief Complaint Penile pain  History limited by: Not Limited   HPI Daniel Dickson is a 83 y.o. male who presents to the emergency department today with primary complaint of penile pain. He states that the pain is sharp. It is severe. He rates it 8/10. Located at the meatus. Patient does have a foley catheter. He states that the pain has been present for the past year. It has been getting worse for the past 2-3 months. He states that he has seen his urologist for this in the past. He states that no one has been able to tell him why he has had pain. Patient also has secondary complaint of decrease in apetite. He states that it has been poor for roughly 6 months. This too has been getting worse and he states that he has not eaten anything for the past two days.    Past Medical History:  Diagnosis Date  . Anxiety   . COPD (chronic obstructive pulmonary disease) (HCC)   . Coronary artery disease    a. 2012 s/p MI/cardiac arrest--> PCI mRCA.  Marland Kitchen Hypertension   . Hypertensive heart disease   . Mixed hyperlipidemia   . PTSD (post-traumatic stress disorder)    with headaches    Patient Active Problem List   Diagnosis Date Noted  . Acute on chronic respiratory failure (HCC) 11/11/2016  . Open wound of lower limb with tendon involvement, right, subsequent encounter   . Cellulitis of right leg 10/18/2016  . DNR (do not resuscitate) discussion   . Palliative care by specialist   . Adult failure to thrive   . Gross hematuria   . Acute respiratory failure (HCC) 02/18/2016  . Urinary retention 11/10/2015  . Hyperkalemia 11/02/2015  . Hypotension 11/02/2015  . Dysphagia, pharyngoesophageal phase 11/02/2015  . Esophageal spasm 11/02/2015  . Constipation 11/02/2015  . SIADH (syndrome of  inappropriate ADH production) (HCC) 11/02/2015  . Urinary retention due to benign prostatic hyperplasia 11/02/2015  . Chronic indwelling Foley catheter 11/02/2015  . Bacteriuria with pyuria 11/02/2015  . Protein-calorie malnutrition, severe 10/30/2015  . ARF (acute renal failure) (HCC) 10/29/2015  . Dyspnea 10/07/2015  . Hyponatremia 06/06/2015  . Coronary artery disease   . Hypertensive heart disease   . Mixed hyperlipidemia   . COPD (chronic obstructive pulmonary disease) (HCC)   . COPD exacerbation (HCC) 01/11/2015  . Moderate COPD (chronic obstructive pulmonary disease) (HCC) 12/08/2014  . Fall 09/03/2014  . Hip fracture (HCC) 09/03/2014  . PTSD (post-traumatic stress disorder) 09/03/2014  . Bilateral leg edema 09/03/2014  . SOB (shortness of breath) 10/31/2012  . Essential hypertension 10/31/2012  . CAD (coronary artery disease) 10/31/2012  . Hyperlipidemia 10/31/2012  . Tachycardia 10/31/2012    Past Surgical History:  Procedure Laterality Date  . CATARACT EXTRACTION    . CHOLECYSTECTOMY    . CORONARY ANGIOPLASTY     with stent; [facility]  . HAND SURGERY    . HEMORRHOID SURGERY    . HIP SURGERY    . VASCULAR SURGERY     stent R femoral artery    Prior to Admission medications   Medication Sig Start Date End Date Taking? Authorizing Provider  acetaminophen (TYLENOL) 500 MG tablet Take 500 mg by mouth daily as needed for mild pain or moderate pain.     [provider]  albuterol (PROAIR HFA) 108 (90 BASE) MCG/ACT inhaler Inhale 2 puffs into the lungs every 6 (six) hours as needed. For wheezing.    [provider]  albuterol (PROVENTIL) (2.5 MG/3ML) 0.083% nebulizer solution Take 3 mLs (2.5 mg total) by nebulization every 4 (four) hours as needed for wheezing or shortness of breath. 09/28/16   Milagros Loll, MD  aspirin EC 81 MG tablet Take 81 mg by mouth daily.    [provider]  benzonatate (TESSALON) 100 MG capsule Take 1 capsule (100 mg total)  by mouth 3 (three) times daily. 11/13/16   Enedina Finner, MD  Cholecalciferol (VITAMIN D3) 1000 UNITS CAPS Take 1,000 Units by mouth daily.    [provider]  doxycycline (VIBRA-TABS) 100 MG tablet Take 1 tablet (100 mg total) by mouth every 12 (twelve) hours. 11/13/16   Enedina Finner, MD  feeding supplement (BOOST / RESOURCE BREEZE) LIQD Take 1 Container by mouth 3 (three) times daily between meals. 11/02/15   Katharina Caper, MD  isosorbide mononitrate (IMDUR) 30 MG 24 hr tablet Take 1 tablet (30 mg total) by mouth daily. 11/02/15   Katharina Caper, MD  magnesium hydroxide (MILK OF MAGNESIA) 400 MG/5ML suspension Take 30 mLs by mouth 2 (two) times daily as needed for mild constipation or moderate constipation. 11/02/15   Katharina Caper, MD  Melatonin 5 MG CAPS Take 10 mg by mouth at bedtime.    [provider]  nitroGLYCERIN (NITROSTAT) 0.4 MG SL tablet Place 0.4 mg under the tongue every 5 (five) minutes as needed for chest pain. Reported on 10/19/2015    [provider]  pantoprazole (PROTONIX) 40 MG tablet Take 1 tablet (40 mg total) by mouth 2 (two) times daily before a meal. 09/25/16   Enid Baas, MD  pravastatin (PRAVACHOL) 80 MG tablet Take 40 mg by mouth daily.    [provider]  predniSONE (DELTASONE) 10 MG tablet Start with 50 mg daily. Taper by 10 mg daily then stop 11/14/16   Enedina Finner, MD  senna-docusate (SENOKOT-S) 8.6-50 MG tablet Take 2 tablets by mouth 2 (two) times daily. 09/08/16   Sharman Cheek, MD  tamsulosin (FLOMAX) 0.4 MG CAPS capsule Take 0.4 mg by mouth daily.     [provider]  tiotropium (SPIRIVA) 18 MCG inhalation capsule Place 1 capsule (18 mcg total) into inhaler and inhale every evening. 10/09/15 11/11/16  Adrian Saran, MD    Allergies Iodine (kelp) [iodine]  Family History  Problem Relation Age of Onset  . COPD Father   . Bladder Cancer Neg Hx   . Kidney cancer Neg Hx   . Prostate cancer Neg Hx     Social  History Social History  Substance Use Topics  . Smoking status: Former Smoker    Packs/day: 3.00    Years: 40.00    Types: Cigarettes  . Smokeless tobacco: Never Used     Comment: quit 62 years  . Alcohol use 1.8 oz/week    3 Glasses of wine per week     Comment: wine at night.     Review of Systems Constitutional: No fever/chills Eyes: No visual changes. ENT: No sore throat. Cardiovascular: Denies chest pain. Respiratory: Denies shortness of breath. Gastrointestinal: No abdominal pain.  Positive for decreased appetite.  Genitourinary: Positive for penile pain.  Musculoskeletal: Negative for back pain. Skin: Negative for rash. Neurological: Negative for headaches, focal weakness or numbness.  ____________________________________________   PHYSICAL EXAM:  VITAL SIGNS: ED Triage Vitals  Enc Vitals  Group     BP 12/31/16 1244 (!) 172/129     Pulse Rate 12/31/16 1244 93     Resp 12/31/16 1244 16     Temp 12/31/16 1244 98 F (36.7 C)     Temp Source 12/31/16 1244 Oral     SpO2 12/31/16 1244 100 %     Weight 12/31/16 1245 126 lb (57.2 kg)     Height 12/31/16 1245  (1.676 m)     Head Circumference --      Peak Flow --      Pain Score 12/31/16 1244 8    Constitutional: Alert and oriented. Well appearing and in no distress. Eyes: Conjunctivae are normal.  ENT   Head: Normocephalic and atraumatic.   Nose: No congestion/rhinnorhea.   Mouth/Throat: Mucous membranes are moist.   Neck: No stridor. Hematological/Lymphatic/Immunilogical: No cervical lymphadenopathy. Cardiovascular: Normal rate, regular rhythm.  No murmurs, rubs, or gallops.  Respiratory: Normal respiratory effort without tachypnea nor retractions. Breath sounds are clear and equal bilaterally. No wheezes/rales/rhonchi. Gastrointestinal: Soft and non tender. No rebound. No guarding.  Genitourinary: Foley catheter in place. No redness or erythema around meatus.  Musculoskeletal: Normal range  of motion in all extremities. No lower extremity edema. Neurologic:  Normal speech and language. No gross focal neurologic deficits are appreciated.  Skin:  Skin is warm, dry and intact. No rash noted. Psychiatric: Mood and affect are normal. Speech and behavior are normal. Patient exhibits appropriate insight and judgment.  ____________________________________________    LABS (pertinent positives/negatives)  Na 129 Cr 0.94 WBC 9.2 Hgb 14 Pl 159 UA WBC 6-30 Leuks small ____________________________________________   EKG  None  ____________________________________________    RADIOLOGY  None  ____________________________________________   PROCEDURES  Procedures  ____________________________________________   INITIAL IMPRESSION / ASSESSMENT AND PLAN / ED COURSE  Pertinent labs & imaging results that were available during my care of the patient were reviewed by me and considered in my medical decision making (see chart for details).  patient presented to the emergency department today with primary concern for penile pain. This has been present for the past year. He does have an indwelling Foley. Urine did have some Eschmann blood cells. Given indwelling Foley status will send off for culture prior to initiating antibiotic therapy. There was no redness or erythema at the meatus. patient's blood work does not show any leukocytosis. Patient's creatinine within normal limits.sodium was slightly decreased. Patient will be given some fluids. Patient does appear to have a history of hyponatremia. At this pointgiven chronic nature of patient's complaints a do feel he is safe for discharge.  ____________________________________________   FINAL CLINICAL IMPRESSION(S) / ED DIAGNOSES  Final diagnoses:  Penile pain  Dehydration  Hyponatremia     Note: This dictation was prepared with Dragon dictation. Any transcriptional errors that result from this process are  unintentional     Phineas Semen, MD 12/31/16 2009

## 2016-12-31 NOTE — ED Triage Notes (Signed)
Pt to ED from home via [facility] c/o groin pain and dysphagia. Per EMS pt reports pain in the "head of penis" and has had an indwelling catheter for the past year. EMS also reports pt has had difficulty swallowing and has not ate in the past 2 days; EMS administered 500 ml of NS. Pt alert and oriented in no acute distress at this time.

## 2016-12-31 NOTE — Discharge Instructions (Signed)
Please seek medical attention for any high fevers, chest pain, shortness of breath, change in behavior, persistent vomiting, bloody stool or any other new or concerning symptoms.  

## 2017-01-02 ENCOUNTER — Encounter: Payer: Self-pay | Admitting: *Deleted

## 2017-01-02 ENCOUNTER — Observation Stay
Admission: EM | Admit: 2017-01-02 | Discharge: 2017-01-03 | Disposition: A | Discharge status: Home / Self Care | Payer: Medicare PPO | Attending: Specialist | Admitting: Specialist

## 2017-01-02 ENCOUNTER — Emergency Department: Payer: Medicare PPO

## 2017-01-02 DIAGNOSIS — I5033 Acute on chronic diastolic (congestive) heart failure: Secondary | ICD-10-CM | POA: Diagnosis not present

## 2017-01-02 DIAGNOSIS — N4889 Other specified disorders of penis: Secondary | ICD-10-CM | POA: Insufficient documentation

## 2017-01-02 DIAGNOSIS — Z7982 Long term (current) use of aspirin: Secondary | ICD-10-CM | POA: Diagnosis not present

## 2017-01-02 DIAGNOSIS — J449 Chronic obstructive pulmonary disease, unspecified: Secondary | ICD-10-CM | POA: Diagnosis not present

## 2017-01-02 DIAGNOSIS — Z955 Presence of coronary angioplasty implant and graft: Secondary | ICD-10-CM | POA: Insufficient documentation

## 2017-01-02 DIAGNOSIS — R0602 Shortness of breath: Secondary | ICD-10-CM | POA: Diagnosis present

## 2017-01-02 DIAGNOSIS — B952 Enterococcus as the cause of diseases classified elsewhere: Secondary | ICD-10-CM | POA: Diagnosis not present

## 2017-01-02 DIAGNOSIS — Z66 Do not resuscitate: Secondary | ICD-10-CM | POA: Insufficient documentation

## 2017-01-02 DIAGNOSIS — J810 Acute pulmonary edema: Secondary | ICD-10-CM

## 2017-01-02 DIAGNOSIS — I509 Heart failure, unspecified: Secondary | ICD-10-CM

## 2017-01-02 DIAGNOSIS — I252 Old myocardial infarction: Secondary | ICD-10-CM | POA: Insufficient documentation

## 2017-01-02 DIAGNOSIS — N390 Urinary tract infection, site not specified: Secondary | ICD-10-CM | POA: Insufficient documentation

## 2017-01-02 DIAGNOSIS — Z9981 Dependence on supplemental oxygen: Secondary | ICD-10-CM | POA: Diagnosis not present

## 2017-01-02 DIAGNOSIS — R32 Unspecified urinary incontinence: Secondary | ICD-10-CM | POA: Insufficient documentation

## 2017-01-02 DIAGNOSIS — I110 Hypertensive heart disease with heart failure: Secondary | ICD-10-CM | POA: Insufficient documentation

## 2017-01-02 DIAGNOSIS — Z87891 Personal history of nicotine dependence: Secondary | ICD-10-CM | POA: Insufficient documentation

## 2017-01-02 DIAGNOSIS — I2510 Atherosclerotic heart disease of native coronary artery without angina pectoris: Secondary | ICD-10-CM | POA: Diagnosis not present

## 2017-01-02 DIAGNOSIS — Z79899 Other long term (current) drug therapy: Secondary | ICD-10-CM | POA: Diagnosis not present

## 2017-01-02 DIAGNOSIS — J9601 Acute respiratory failure with hypoxia: Secondary | ICD-10-CM | POA: Diagnosis not present

## 2017-01-02 LAB — CBC WITH DIFFERENTIAL/PLATELET
BASOS ABS: 0.1 10*3/uL (ref 0–0.1)
BASOS PCT: 1 %
Eosinophils Absolute: 0.3 10*3/uL (ref 0–0.7)
Eosinophils Relative: 3 %
HEMATOCRIT: 36.8 % — AB (ref 40.0–52.0)
HEMOGLOBIN: 12.8 g/dL — AB (ref 13.0–18.0)
Lymphocytes Relative: 11 %
Lymphs Abs: 1 10*3/uL (ref 1.0–3.6)
MCH: 32.1 pg (ref 26.0–34.0)
MCHC: 34.9 g/dL (ref 32.0–36.0)
MCV: 92.2 fL (ref 80.0–100.0)
MONO ABS: 0.6 10*3/uL (ref 0.2–1.0)
Monocytes Relative: 7 %
NEUTROS ABS: 6.9 10*3/uL — AB (ref 1.4–6.5)
NEUTROS PCT: 78 %
Platelets: 194 10*3/uL (ref 150–440)
RBC: 3.99 MIL/uL — ABNORMAL LOW (ref 4.40–5.90)
RDW: 14.2 % (ref 11.5–14.5)
WBC: 8.9 10*3/uL (ref 3.8–10.6)

## 2017-01-02 LAB — URINALYSIS, COMPLETE (UACMP) WITH MICROSCOPIC
BACTERIA UA: NONE SEEN
Bilirubin Urine: NEGATIVE
GLUCOSE, UA: NEGATIVE mg/dL
Ketones, ur: NEGATIVE mg/dL
NITRITE: NEGATIVE
PH: 6 (ref 5.0–8.0)
PROTEIN: NEGATIVE mg/dL
Specific Gravity, Urine: 1.013 (ref 1.005–1.030)
Squamous Epithelial / LPF: NONE SEEN

## 2017-01-02 LAB — BASIC METABOLIC PANEL
ANION GAP: 8 (ref 5–15)
BUN: 26 mg/dL — ABNORMAL HIGH (ref 6–20)
CALCIUM: 9.1 mg/dL (ref 8.9–10.3)
CO2: 25 mmol/L (ref 22–32)
Chloride: 97 mmol/L — ABNORMAL LOW (ref 101–111)
Creatinine, Ser: 0.7 mg/dL (ref 0.61–1.24)
Glucose, Bld: 124 mg/dL — ABNORMAL HIGH (ref 65–99)
Potassium: 4.4 mmol/L (ref 3.5–5.1)
SODIUM: 130 mmol/L — AB (ref 135–145)

## 2017-01-02 LAB — TROPONIN I: Troponin I: 0.03 ng/mL (ref <0.03)

## 2017-01-02 LAB — URINE CULTURE: Culture: >=100000 — AB

## 2017-01-02 MED ORDER — DEXTROSE 5 % IV SOLN
1.0000 g | INTRAVENOUS | Status: DC
  Filled 2017-01-02: qty 10

## 2017-01-02 MED ORDER — ISOSORBIDE MONONITRATE ER 30 MG PO TB24
30.0000 mg | ORAL_TABLET | Freq: Every day | ORAL | Status: DC
  Administered 2017-01-02 – 2017-01-03 (×2): 30 mg via ORAL
  Filled 2017-01-02 (×2): qty 1

## 2017-01-02 MED ORDER — ACETAMINOPHEN 325 MG PO TABS: 650.0000 mg | ORAL_TABLET | Freq: Four times a day (QID) | ORAL | Status: DC | PRN

## 2017-01-02 MED ORDER — TAMSULOSIN HCL 0.4 MG PO CAPS
0.4000 mg | ORAL_CAPSULE | Freq: Every day | ORAL | Status: DC
  Administered 2017-01-02 – 2017-01-03 (×2): 0.4 mg via ORAL
  Filled 2017-01-02 (×2): qty 1

## 2017-01-02 MED ORDER — ONDANSETRON HCL 4 MG PO TABS: 4.0000 mg | ORAL_TABLET | Freq: Four times a day (QID) | ORAL | Status: DC | PRN

## 2017-01-02 MED ORDER — HYDROCODONE-ACETAMINOPHEN 5-325 MG PO TABS
1.0000 | ORAL_TABLET | ORAL | Status: DC | PRN
  Administered 2017-01-02: 1 via ORAL
  Filled 2017-01-02: qty 1

## 2017-01-02 MED ORDER — CEFTRIAXONE SODIUM IN DEXTROSE 20 MG/ML IV SOLN: 1.0000 g | INTRAVENOUS | Status: DC

## 2017-01-02 MED ORDER — BENZONATATE 100 MG PO CAPS
100.0000 mg | ORAL_CAPSULE | Freq: Three times a day (TID) | ORAL | Status: DC
  Administered 2017-01-02 – 2017-01-03 (×2): 100 mg via ORAL
  Filled 2017-01-02 (×2): qty 1

## 2017-01-02 MED ORDER — FUROSEMIDE 40 MG PO TABS
40.0000 mg | ORAL_TABLET | Freq: Every day | ORAL | Status: DC
  Filled 2017-01-02: qty 1

## 2017-01-02 MED ORDER — POLYETHYLENE GLYCOL 3350 17 G PO PACK: 17.0000 g | PACK | Freq: Every day | ORAL | Status: DC | PRN

## 2017-01-02 MED ORDER — CEFTRIAXONE SODIUM IN DEXTROSE 20 MG/ML IV SOLN
1.0000 g | Freq: Once | INTRAVENOUS | Status: AC
  Administered 2017-01-02: 1 g via INTRAVENOUS
  Filled 2017-01-02: qty 50

## 2017-01-02 MED ORDER — PREDNISONE 50 MG PO TABS
50.0000 mg | ORAL_TABLET | Freq: Every day | ORAL | Status: DC
  Administered 2017-01-03: 50 mg via ORAL
  Filled 2017-01-02: qty 1

## 2017-01-02 MED ORDER — FUROSEMIDE 10 MG/ML IJ SOLN
40.0000 mg | Freq: Once | INTRAMUSCULAR | Status: AC
  Administered 2017-01-02: 40 mg via INTRAVENOUS
  Filled 2017-01-02: qty 4

## 2017-01-02 MED ORDER — LIDOCAINE HCL 2 % EX GEL: 1.0000 "application " | CUTANEOUS | Status: DC | PRN

## 2017-01-02 MED ORDER — LIDOCAINE HCL 2 % EX GEL
1.0000 "application " | Freq: Once | CUTANEOUS | Status: AC
  Administered 2017-01-02: 1 via URETHRAL
  Filled 2017-01-02: qty 5

## 2017-01-02 MED ORDER — IPRATROPIUM-ALBUTEROL 0.5-2.5 (3) MG/3ML IN SOLN
3.0000 mL | Freq: Once | RESPIRATORY_TRACT | Status: AC
  Administered 2017-01-02: 3 mL via RESPIRATORY_TRACT
  Filled 2017-01-02: qty 3

## 2017-01-02 MED ORDER — ACETAMINOPHEN 650 MG RE SUPP: 650.0000 mg | Freq: Four times a day (QID) | RECTAL | Status: DC | PRN

## 2017-01-02 MED ORDER — ENOXAPARIN SODIUM 40 MG/0.4ML ~~LOC~~ SOLN
40.0000 mg | SUBCUTANEOUS | Status: DC
  Administered 2017-01-02: 40 mg via SUBCUTANEOUS
  Filled 2017-01-02: qty 0.4

## 2017-01-02 MED ORDER — SODIUM CHLORIDE 0.9 % IV BOLUS (SEPSIS)
500.0000 mL | Freq: Once | INTRAVENOUS | Status: AC
  Administered 2017-01-02: 500 mL via INTRAVENOUS

## 2017-01-02 MED ORDER — PRAVASTATIN SODIUM 40 MG PO TABS
40.0000 mg | ORAL_TABLET | Freq: Every day | ORAL | Status: DC
  Administered 2017-01-02 – 2017-01-03 (×2): 40 mg via ORAL
  Filled 2017-01-02 (×2): qty 1

## 2017-01-02 MED ORDER — MORPHINE SULFATE (PF) 2 MG/ML IV SOLN
2.0000 mg | Freq: Once | INTRAVENOUS | Status: AC
  Administered 2017-01-02: 2 mg via INTRAVENOUS
  Filled 2017-01-02: qty 1

## 2017-01-02 MED ORDER — ALBUTEROL SULFATE (2.5 MG/3ML) 0.083% IN NEBU
2.5000 mg | INHALATION_SOLUTION | RESPIRATORY_TRACT | Status: DC | PRN
  Administered 2017-01-02 – 2017-01-03 (×2): 2.5 mg via RESPIRATORY_TRACT
  Filled 2017-01-02 (×2): qty 3

## 2017-01-02 MED ORDER — MELATONIN 5 MG PO TABS
10.0000 mg | ORAL_TABLET | Freq: Every day | ORAL | Status: DC
  Administered 2017-01-02: 10 mg via ORAL
  Filled 2017-01-02 (×2): qty 2

## 2017-01-02 MED ORDER — METHYLPREDNISOLONE SODIUM SUCC 125 MG IJ SOLR
125.0000 mg | Freq: Once | INTRAMUSCULAR | Status: AC
  Administered 2017-01-02: 125 mg via INTRAVENOUS
  Filled 2017-01-02: qty 2

## 2017-01-02 MED ORDER — TIOTROPIUM BROMIDE MONOHYDRATE 18 MCG IN CAPS
18.0000 ug | ORAL_CAPSULE | Freq: Every evening | RESPIRATORY_TRACT | Status: DC
  Administered 2017-01-02: 18 ug via RESPIRATORY_TRACT
  Filled 2017-01-02: qty 5

## 2017-01-02 MED ORDER — ONDANSETRON HCL 4 MG/2ML IJ SOLN: 4.0000 mg | Freq: Four times a day (QID) | INTRAMUSCULAR | Status: DC | PRN

## 2017-01-02 MED ORDER — MAGNESIUM HYDROXIDE 400 MG/5ML PO SUSP: 30.0000 mL | Freq: Two times a day (BID) | ORAL | Status: DC | PRN

## 2017-01-02 MED ORDER — SENNOSIDES-DOCUSATE SODIUM 8.6-50 MG PO TABS
2.0000 | ORAL_TABLET | Freq: Two times a day (BID) | ORAL | Status: DC
  Administered 2017-01-02 – 2017-01-03 (×2): 2 via ORAL
  Filled 2017-01-02 (×2): qty 2

## 2017-01-02 MED ORDER — NITROGLYCERIN 0.4 MG SL SUBL: 0.4000 mg | SUBLINGUAL_TABLET | SUBLINGUAL | Status: DC | PRN

## 2017-01-02 MED ORDER — BOOST / RESOURCE BREEZE PO LIQD
1.0000 | Freq: Three times a day (TID) | ORAL | Status: DC
  Administered 2017-01-02: 23:00:00 via ORAL
  Administered 2017-01-03: 1 via ORAL

## 2017-01-02 MED ORDER — PANTOPRAZOLE SODIUM 40 MG PO TBEC
40.0000 mg | DELAYED_RELEASE_TABLET | Freq: Two times a day (BID) | ORAL | Status: DC
  Administered 2017-01-03: 40 mg via ORAL
  Filled 2017-01-02: qty 1

## 2017-01-02 MED ORDER — ASPIRIN EC 81 MG PO TBEC
81.0000 mg | DELAYED_RELEASE_TABLET | Freq: Every day | ORAL | Status: DC
  Administered 2017-01-02 – 2017-01-03 (×2): 81 mg via ORAL
  Filled 2017-01-02 (×2): qty 1

## 2017-01-02 NOTE — ED Provider Notes (Signed)
[facility] Emergency Department Provider Note ____________________________________________   First MD Initiated Contact with Patient 01/02/17 1438     (approximate)  I have reviewed the triage vital signs and the nursing notes.   HISTORY  Chief Complaint Shortness of Breath    HPI Daniel Dickson is a 83 y.o. male with a history of COPD, hypertension, CAD, multiple other medical problems as noted below who presents with is of breath for the last several weeks, gradual onset, worsening, associated with mild cough, and not relieved by his O2 or albuterol at home. Patient also reports dysuria and pain in his penis.  Denies penile discharge, testicular or flank pain.      Past Medical History:  Diagnosis Date  . Anxiety   . COPD (chronic obstructive pulmonary disease) (HCC)   . Coronary artery disease    a. 2012 s/p MI/cardiac arrest--> PCI mRCA.  Marland Kitchen Hypertension   . Hypertensive heart disease   . Mixed hyperlipidemia   . PTSD (post-traumatic stress disorder)    with headaches    Patient Active Problem List   Diagnosis Date Noted  . Acute on chronic respiratory failure (HCC) 11/11/2016  . Open wound of lower limb with tendon involvement, right, subsequent encounter   . Cellulitis of right leg 10/18/2016  . DNR (do not resuscitate) discussion   . Palliative care by specialist   . Adult failure to thrive   . Gross hematuria   . Acute respiratory failure (HCC) 02/18/2016  . Urinary retention 11/10/2015  . Hyperkalemia 11/02/2015  . Hypotension 11/02/2015  . Dysphagia, pharyngoesophageal phase 11/02/2015  . Esophageal spasm 11/02/2015  . Constipation 11/02/2015  . SIADH (syndrome of inappropriate ADH production) (HCC) 11/02/2015  . Urinary retention due to benign prostatic hyperplasia 11/02/2015  . Chronic indwelling Foley catheter 11/02/2015  . Bacteriuria with pyuria 11/02/2015  . Protein-calorie malnutrition, severe 10/30/2015  . ARF  (acute renal failure) (HCC) 10/29/2015  . Dyspnea 10/07/2015  . Hyponatremia 06/06/2015  . Coronary artery disease   . Hypertensive heart disease   . Mixed hyperlipidemia   . COPD (chronic obstructive pulmonary disease) (HCC)   . COPD exacerbation (HCC) 01/11/2015  . Moderate COPD (chronic obstructive pulmonary disease) (HCC) 12/08/2014  . Fall 09/03/2014  . Hip fracture (HCC) 09/03/2014  . PTSD (post-traumatic stress disorder) 09/03/2014  . Bilateral leg edema 09/03/2014  . SOB (shortness of breath) 10/31/2012  . Essential hypertension 10/31/2012  . CAD (coronary artery disease) 10/31/2012  . Hyperlipidemia 10/31/2012  . Tachycardia 10/31/2012    Past Surgical History:  Procedure Laterality Date  . CATARACT EXTRACTION    . CHOLECYSTECTOMY    . CORONARY ANGIOPLASTY     with stent; [facility]  . HAND SURGERY    . HEMORRHOID SURGERY    . HIP SURGERY    . VASCULAR SURGERY     stent R femoral artery    Prior to Admission medications   Medication Sig Start Date End Date Taking? Authorizing Provider  acetaminophen (TYLENOL) 500 MG tablet Take 500 mg by mouth daily as needed for mild pain or moderate pain.     [provider]  albuterol (PROAIR HFA) 108 (90 BASE) MCG/ACT inhaler Inhale 2 puffs into the lungs every 6 (six) hours as needed. For wheezing.    [provider]  albuterol (PROVENTIL) (2.5 MG/3ML) 0.083% nebulizer solution Take 3 mLs (2.5 mg total) by nebulization every 4 (four) hours as needed for wheezing or shortness of breath. 09/28/16  Milagros Loll, MD  aspirin EC 81 MG tablet Take 81 mg by mouth daily.    [provider]  benzonatate (TESSALON) 100 MG capsule Take 1 capsule (100 mg total) by mouth 3 (three) times daily. 11/13/16   Enedina Finner, MD  Cholecalciferol (VITAMIN D3) 1000 UNITS CAPS Take 1,000 Units by mouth daily.    [provider]  doxycycline (VIBRA-TABS) 100 MG tablet Take 1 tablet (100 mg total) by mouth every 12 (twelve)  hours. 11/13/16   Enedina Finner, MD  feeding supplement (BOOST / RESOURCE BREEZE) LIQD Take 1 Container by mouth 3 (three) times daily between meals. 11/02/15   Katharina Caper, MD  isosorbide mononitrate (IMDUR) 30 MG 24 hr tablet Take 1 tablet (30 mg total) by mouth daily. 11/02/15   Katharina Caper, MD  magnesium hydroxide (MILK OF MAGNESIA) 400 MG/5ML suspension Take 30 mLs by mouth 2 (two) times daily as needed for mild constipation or moderate constipation. 11/02/15   Katharina Caper, MD  Melatonin 5 MG CAPS Take 10 mg by mouth at bedtime.    [provider]  nitroGLYCERIN (NITROSTAT) 0.4 MG SL tablet Place 0.4 mg under the tongue every 5 (five) minutes as needed for chest pain. Reported on 10/19/2015    [provider]  pantoprazole (PROTONIX) 40 MG tablet Take 1 tablet (40 mg total) by mouth 2 (two) times daily before a meal. 09/25/16   Enid Baas, MD  pravastatin (PRAVACHOL) 80 MG tablet Take 40 mg by mouth daily.    [provider]  predniSONE (DELTASONE) 10 MG tablet Start with 50 mg daily. Taper by 10 mg daily then stop 11/14/16   Enedina Finner, MD  senna-docusate (SENOKOT-S) 8.6-50 MG tablet Take 2 tablets by mouth 2 (two) times daily. 09/08/16   Sharman Cheek, MD  tamsulosin (FLOMAX) 0.4 MG CAPS capsule Take 0.4 mg by mouth daily.     [provider]  tiotropium (SPIRIVA) 18 MCG inhalation capsule Place 1 capsule (18 mcg total) into inhaler and inhale every evening. 10/09/15 11/11/16  Adrian Saran, MD    Allergies Iodine (kelp) [iodine]  Family History  Problem Relation Age of Onset  . COPD Father   . Bladder Cancer Neg Hx   . Kidney cancer Neg Hx   . Prostate cancer Neg Hx     Social History Social History  Substance Use Topics  . Smoking status: Former Smoker    Packs/day: 3.00    Years: 40.00    Types: Cigarettes  . Smokeless tobacco: Never Used     Comment: quit 62 years  . Alcohol use 1.8 oz/week    3 Glasses of wine per week      Comment: wine at night.     Review of Systems  Constitutional: No fever. Eyes: No visual changes. ENT: No sore throat. Cardiovascular: Denies chest pain. Respiratory: Positive for shortness of breath. Gastrointestinal: No nausea, no vomiting.  Genitourinary: Positive for dysuria.  Musculoskeletal: Negative for back pain. Skin: Negative for rash. Neurological: Negative for headaches.   ____________________________________________   PHYSICAL EXAM:  VITAL SIGNS: ED Triage Vitals  Enc Vitals Group     BP 01/02/17 1426 112/71     Pulse Rate 01/02/17 1426 (!) 102     Resp 01/02/17 1426 18     Temp 01/02/17 1426 (!) 97.5 F (36.4 C)     Temp Source 01/02/17 1426 Oral     SpO2 01/02/17 1424 95 %     Weight 01/02/17 1426 126  lb (57.2 kg)     Height 01/02/17 1426  (1.676 m)     Head Circumference --      Peak Flow --      Pain Score 01/02/17 1425 0     Pain Loc --      Pain Edu? --      Excl. in GC? --     Constitutional: Alert and oriented. Relatively well appearing and in no acute distress. Eyes: Conjunctivae are normal.  EOMI.  PERRLA.  Head: Atraumatic. Nose: No congestion/rhinnorhea. Mouth/Throat: Mucous membranes are dry.  Neck: Normal range of motion.  Cardiovascular: Normal rate, regular rhythm. Grossly normal heart sounds.  Good peripheral circulation. Respiratory: Normal respiratory effort.  Bilat rales and coarse breath sounds.  Gastrointestinal: Soft and nontender. No distention.  Genitourinary: No CVA tenderness.  Foley in place.  No penile discharge, no erythema or induration, nontender.  Scrotum nontender with no masses.  Musculoskeletal: No lower extremity edema.  Extremities warm and well perfused.  Neurologic:  Normal speech and language. No gross focal neurologic deficits are appreciated.  Skin:  Skin is warm and dry. No rash noted. Psychiatric: Mood and affect are normal. Speech and behavior are  normal.  ____________________________________________   LABS (all labs ordered are listed, but only abnormal results are displayed)  Labs Reviewed  BASIC METABOLIC PANEL - Abnormal; Notable for the following:       Result Value   Sodium 130 (*)    Chloride 97 (*)    Glucose, Bld 124 (*)    BUN 26 (*)    All other components within normal limits  CBC WITH DIFFERENTIAL/PLATELET - Abnormal; Notable for the following:    RBC 3.99 (*)    Hemoglobin 12.8 (*)    HCT 36.8 (*)    Neutro Abs 6.9 (*)    All other components within normal limits  URINALYSIS, COMPLETE (UACMP) WITH MICROSCOPIC - Abnormal; Notable for the following:    Color, Urine YELLOW (*)    APPearance CLEAR (*)    Hgb urine dipstick MODERATE (*)    Leukocytes, UA SMALL (*)    All other components within normal limits  TROPONIN I - Abnormal; Notable for the following:    Troponin I 0.03 (*)    All other components within normal limits   ____________________________________________  EKG  ED ECG REPORT I, Dionne Bucy, the attending physician, personally viewed and interpreted this ECG.  Date: 01/02/2017 EKG Time: 1437 Rate: 100 Rhythm: normal sinus rhythm with PVC QRS Axis: normal Intervals: normal ST/T Wave abnormalities: RVH, no specific changes Narrative Interpretation: no evidence of acute ischemia; no significant change from EKG dated 11/12/16  ____________________________________________  RADIOLOGY  Chest x-ray shows mild lower lobe edema concerning for CHF.   ____________________________________________   PROCEDURES  Procedure(s) performed: No    Critical Care performed: No ____________________________________________   INITIAL IMPRESSION / ASSESSMENT AND PLAN / ED COURSE  Pertinent labs & imaging results that were available during my care of the patient were reviewed by me and considered in my medical decision making (see chart for details).  83 year old male with past medical  history as noted presents with worsening shortness of breath for the last several weeks, associated with mild cough. Patient also reports penile pain associated with his Foley. On exam, vital signs are normal and patient has good O2 sat on 2 L of nasal cannula, and exam is otherwise as described.  More rales than wheeze on lung exam.  Differential  includes COPD exacerbation, pneumonia, bronchitis, less likely CHF or other cardiac etiology.  Also consider UTI given the dysuria - there is no evidence of penile or scrotal infection.  plan: Chest x-ray, nebs and steroid, lab workup, and reassess.    ----------------------------------------- 7:04 PM on 01/02/2017 -----------------------------------------  UA consistent with potential UTI so we will give her ceftriaxone. Chest x-ray with findings more consistent with potential CHF.  Last echo shows EF in the 40s and pt does not carry dx of CHF.  At this time he appears slightly dry so will hold on lasix for now.  No resp distress.   Signed out to hospitalist.   ____________________________________________   FINAL CLINICAL IMPRESSION(S) / ED DIAGNOSES  Final diagnoses:  Shortness of breath  Urinary tract infection without hematuria, site unspecified  Acute pulmonary edema (HCC)      NEW MEDICATIONS STARTED DURING THIS VISIT:  New Prescriptions   No medications on file     Note:  This document was prepared using Dragon voice recognition software and may include unintentional dictation errors.    Dionne Bucy, MD 01/02/17 [phone]

## 2017-01-02 NOTE — H&P (Signed)
[facility] [place] [facility]   PATIENT NAME: Daniel Dickson    MR#:  [id number]  DATE OF BIRTH:  [birth year]  DATE OF ADMISSION:  01/02/2017  PRIMARY CARE PHYSICIAN: [name], MD   REQUESTING/REFERRING PHYSICIAN: Dr. Marisa Severin  CHIEF COMPLAINT:   Chief Complaint  Patient presents with  . Shortness of Breath    HISTORY OF PRESENT ILLNESS:  Daniel Dickson  is a 83 y.o. male with a known history of COPD, chronic systolic CHF, hypertension, urinary retention with chronic Foley catheter, chronic respiratory failure presents to the emergency room complaining of worsening shortness of breath and penile pain. Patient has had recurrent admissions and ED visits for shortness of breath.  PAST MEDICAL HISTORY:   Past Medical History:  Diagnosis Date  . Anxiety   . COPD (chronic obstructive pulmonary disease) (HCC)   . Coronary artery disease    a. 2012 s/p MI/cardiac arrest--> PCI mRCA.  Marland Kitchen Hypertension   . Hypertensive heart disease   . Mixed hyperlipidemia   . PTSD (post-traumatic stress disorder)    with headaches  . Systolic CHF (HCC)     PAST SURGICAL HISTORY:   Past Surgical History:  Procedure Laterality Date  . CATARACT EXTRACTION    . CHOLECYSTECTOMY    . CORONARY ANGIOPLASTY     with stent; [facility]  . HAND SURGERY    . HEMORRHOID SURGERY    . HIP SURGERY    . VASCULAR SURGERY     stent R femoral artery    SOCIAL HISTORY:   Social History  Substance Use Topics  . Smoking status: Former Smoker    Packs/day: 3.00    Years: 40.00    Types: Cigarettes  . Smokeless tobacco: Never Used     Comment: quit 62 years  . Alcohol use 1.8 oz/week    3 Glasses of wine per week     Comment: wine at night.     FAMILY HISTORY:   Family History  Problem Relation Age of Onset  . COPD Father   . Bladder Cancer Neg Hx   . Kidney cancer Neg Hx   . Prostate cancer Neg Hx     DRUG ALLERGIES:   Allergies  Allergen Reactions  . Iodine (Kelp)  [Iodine] Other (See Comments)    Other reaction(s): Other (See Comments) GI Upset    REVIEW OF SYSTEMS:   Review of Systems  Constitutional: Positive for malaise/fatigue. Negative for chills and fever.  HENT: Negative for sore throat.   Eyes: Negative for blurred vision, double vision and pain.  Respiratory: Positive for cough, shortness of breath and wheezing. Negative for hemoptysis.   Cardiovascular: Negative for chest pain, palpitations, orthopnea and leg swelling.  Gastrointestinal: Negative for abdominal pain, constipation, diarrhea, heartburn, nausea and vomiting.  Genitourinary: Negative for dysuria and hematuria.  Musculoskeletal: Negative for back pain and joint pain.  Skin: Negative for rash.  Neurological: Positive for weakness. Negative for sensory change, speech change, focal weakness and headaches.  Endo/Heme/Allergies: Does not bruise/bleed easily.  Psychiatric/Behavioral: Negative for depression. The patient is not nervous/anxious.    MEDICATIONS AT HOME:   Prior to Admission medications   Medication Sig Start Date End Date Taking? Authorizing Provider  acetaminophen (TYLENOL) 500 MG tablet Take 500 mg by mouth daily as needed for mild pain or moderate pain.     [provider]  albuterol (PROAIR HFA) 108 (90 BASE) MCG/ACT inhaler Inhale 2 puffs into the lungs every 6 (six)  hours as needed. For wheezing.    [provider]  albuterol (PROVENTIL) (2.5 MG/3ML) 0.083% nebulizer solution Take 3 mLs (2.5 mg total) by nebulization every 4 (four) hours as needed for wheezing or shortness of breath. 09/28/16   Milagros Loll, MD  aspirin EC 81 MG tablet Take 81 mg by mouth daily.    [provider]  benzonatate (TESSALON) 100 MG capsule Take 1 capsule (100 mg total) by mouth 3 (three) times daily. 11/13/16   Enedina Finner, MD  Cholecalciferol (VITAMIN D3) 1000 UNITS CAPS Take 1,000 Units by mouth daily.    [provider]  doxycycline  (VIBRA-TABS) 100 MG tablet Take 1 tablet (100 mg total) by mouth every 12 (twelve) hours. 11/13/16   Enedina Finner, MD  feeding supplement (BOOST / RESOURCE BREEZE) LIQD Take 1 Container by mouth 3 (three) times daily between meals. 11/02/15   Katharina Caper, MD  isosorbide mononitrate (IMDUR) 30 MG 24 hr tablet Take 1 tablet (30 mg total) by mouth daily. 11/02/15   Katharina Caper, MD  magnesium hydroxide (MILK OF MAGNESIA) 400 MG/5ML suspension Take 30 mLs by mouth 2 (two) times daily as needed for mild constipation or moderate constipation. 11/02/15   Katharina Caper, MD  Melatonin 5 MG CAPS Take 10 mg by mouth at bedtime.    [provider]  nitroGLYCERIN (NITROSTAT) 0.4 MG SL tablet Place 0.4 mg under the tongue every 5 (five) minutes as needed for chest pain. Reported on 10/19/2015    [provider]  pantoprazole (PROTONIX) 40 MG tablet Take 1 tablet (40 mg total) by mouth 2 (two) times daily before a meal. 09/25/16   Enid Baas, MD  pravastatin (PRAVACHOL) 80 MG tablet Take 40 mg by mouth daily.    [provider]  predniSONE (DELTASONE) 10 MG tablet Start with 50 mg daily. Taper by 10 mg daily then stop 11/14/16   Enedina Finner, MD  senna-docusate (SENOKOT-S) 8.6-50 MG tablet Take 2 tablets by mouth 2 (two) times daily. 09/08/16   Sharman Cheek, MD  tamsulosin (FLOMAX) 0.4 MG CAPS capsule Take 0.4 mg by mouth daily.     [provider]  tiotropium (SPIRIVA) 18 MCG inhalation capsule Place 1 capsule (18 mcg total) into inhaler and inhale every evening. 10/09/15 11/11/16  Adrian Saran, MD     VITAL SIGNS:  Blood pressure (!) 145/84, pulse 90, temperature (!) 97.5 F (36.4 C), temperature source Oral, resp. rate 19, height  (1.676 m), weight 57.2 kg (126 lb), SpO2 95 %.  PHYSICAL EXAMINATION:  Physical Exam  GENERAL:  83 y.o.-year-old patient lying in the bed with no acute distress.  EYES: Pupils equal, round, reactive to light and accommodation. No  scleral icterus. Extraocular muscles intact.  HEENT: Head atraumatic, normocephalic. Oropharynx and nasopharynx clear. No oropharyngeal erythema, moist oral mucosa  NECK:  Supple, no jugular venous distention. No thyroid enlargement, no tenderness.  LUNGS: Normal breath sounds bilaterally, no wheezing, rales, rhonchi. No use of accessory muscles of respiration.  CARDIOVASCULAR: S1, S2 normal. No murmurs, rubs, or gallops.  ABDOMEN: Soft, nontender, nondistended. Bowel sounds present. No organomegaly or mass.  EXTREMITIES: No pedal edema, cyanosis, or clubbing. + 2 pedal & radial pulses b/l.   NEUROLOGIC: Cranial nerves II through XII are intact. No focal Motor or sensory deficits appreciated b/l PSYCHIATRIC: The patient is alert and oriented x 3. Good affect.  SKIN: No obvious rash, lesion, or ulcer.   LABORATORY PANEL:   CBC  Recent Labs  Lab 01/02/17 1432  WBC 8.9  HGB 12.8*  HCT 36.8*  PLT 194   ------------------------------------------------------------------------------------------------------------------  Chemistries   Recent Labs Lab 12/31/16 1316 01/02/17 1432  NA 129* 130*  K 4.2 4.4  CL 98* 97*  CO2 23 25  GLUCOSE 114* 124*  BUN 24* 26*  CREATININE 0.94 0.70  CALCIUM 8.6* 9.1  AST 26  --   ALT 22  --   ALKPHOS 41  --   BILITOT 0.8  --    ------------------------------------------------------------------------------------------------------------------  Cardiac Enzymes  Recent Labs Lab 01/02/17 1432  TROPONINI 0.03*   ------------------------------------------------------------------------------------------------------------------  RADIOLOGY:  Dg Chest Portable 1 View  Result Date: 01/02/2017 CLINICAL DATA:  COPD with progressive shortness of breath. EXAM: PORTABLE CHEST 1 VIEW COMPARISON:  Two-view chest x-ray 11/11/2016 FINDINGS: The heart size is normal. Mild lower lobe edema and airspace disease is present. Small effusions are suggested. There is  no significant consolidation. Aortic atherosclerosis is again noted. IMPRESSION: 1. Mild lower lobe edema the and atelectasis raise concern for congestive heart failure. 2. Probable bilateral effusions. 3. Aortic atherosclerosis. Electronically Signed   By: Marin Roberts M.D.   On: 01/02/2017 15:31   IMPRESSION AND PLAN:   * Acute on chronic systolic CHF, mild We'll give 1 dose of IV Lasix now. Start oral Lasix from tomorrow. Monitor input and output. Last echocardiogram showed ejection fraction of 45%.  * Chronic COPD. No wheezing. Continue nebulizers and home steroid. Continue oxygen for chronic respiratory failure  * Urinary retention with chronic Foley catheter. Penile pain. No hematuria. Will add lidocaine gel and Norco. Foley catheter changed on the back.  * UTI. On ceftriaxone.  * Hypertension. Continue home medications.  * DVT prophylaxis with Lovenox  Patient has had recurrent admissions for shortness of breath. Will consult palliative care. Would be a hospice candidate. Daughter wants him to go to a nursing home but patient wants to stay home.  All the records are reviewed and case discussed with ED provider. Management plans discussed with the patient, family and they are in agreement.  CODE STATUS: DNR  TOTAL TIME TAKING CARE OF THIS PATIENT: 40 minutes.   Milagros Loll R M.D on 01/02/2017 at 7:40 PM  Between 7am to 6pm - Pager - [phone]  After 6pm go to [email or web address] - password [email or web address] [facility]  [facility]  Office  [phone]  CC: Primary care physician; [name], MD  Note: This dictation was prepared with Dragon dictation along with smaller phrase technology. Any transcriptional errors that result from this process are unintentional.

## 2017-01-02 NOTE — ED Notes (Signed)
Accepting RN reports that bed is still being cleaned and she will call when it is ready for patient. ED charge made aware.

## 2017-01-02 NOTE — ED Triage Notes (Signed)
Pt arrives via EMS from home with complaints of SOb, denies any cough, states SOB for several months, pt on 2L [place] chronic, EMS states giving a duoneb PTA with diminished lung sounds, pt speaking in full clear sentances, states lack of appeitie, AO at baseline

## 2017-01-02 NOTE — ED Notes (Signed)
Attempted to call report to floor. Secretary informed this RN that accepting RN would call back when available for report.

## 2017-01-02 NOTE — ED Notes (Signed)
Patient assisted to call granddaughter Missy, to inform her of plan of care.

## 2017-01-03 LAB — BASIC METABOLIC PANEL
Anion gap: 8 (ref 5–15)
BUN: 29 mg/dL — ABNORMAL HIGH (ref 6–20)
CALCIUM: 9.1 mg/dL (ref 8.9–10.3)
CO2: 25 mmol/L (ref 22–32)
CREATININE: 0.86 mg/dL (ref 0.61–1.24)
Chloride: 99 mmol/L — ABNORMAL LOW (ref 101–111)
Glucose, Bld: 142 mg/dL — ABNORMAL HIGH (ref 65–99)
Potassium: 4.2 mmol/L (ref 3.5–5.1)
SODIUM: 132 mmol/L — AB (ref 135–145)

## 2017-01-03 MED ORDER — AMOXICILLIN 500 MG PO CAPS: 500.0000 mg | ORAL_CAPSULE | Freq: Three times a day (TID) | ORAL | 0 refills | Status: AC

## 2017-01-03 MED ORDER — FUROSEMIDE 40 MG PO TABS: 20.0000 mg | ORAL_TABLET | Freq: Every day | ORAL | 1 refills | Status: DC

## 2017-01-03 NOTE — Progress Notes (Signed)
Initial Nutrition Assessment  DOCUMENTATION CODES:   Severe malnutrition in context of chronic illness  INTERVENTION:  Discussed with patient ways to increase PO and protein intake  Recommend Ensure Enlive po BID, each supplement provides 350 kcal and 20 grams of protein  NUTRITION DIAGNOSIS:   Malnutrition related to chronic illness (Old age) as evidenced by severe depletion of muscle mass, severe depletion of body fat, moderate depletion of body fat.  GOAL:   Patient will meet greater than or equal to 90% of their needs  MONITOR:   PO intake, I & O's, Labs, Supplement acceptance, Weight trends  REASON FOR ASSESSMENT:   Malnutrition Screening Tool    ASSESSMENT:   Daniel Dickson  is a 83 y.o. male with a known history of COPD, chronic systolic CHF, hypertension, urinary retention with chronic Foley catheter, chronic respiratory failure presents to the emergency room complaining of worsening shortness of breath and penile pain.  Spoke with Daniel Dickson at bedside. Saw him last November. He reports eating approximately 2 meals per day now.  Often he has 2 pancakes and coffee for breakfast and something like meatloaf or grilled porkchops with sides for dinner. Also consumes normally at least 1 boost per day and yogurt. Reports a 20+ pound weight loss over the past year "related to old" age with a normal weight around "140 something." Per chart, appears patient's weight has remained stable between 121-128 pounds over the past 7 months. He was 138.7 pounds as of 10/19/2015, indicating an 18 pound/13% insignificant weight loss over 14 months.  PO intake was good today, he had peach yogurt, cream of wheat with margarine and coffee with two creamers.  Discharging today, but patient and I discussed means by which to increase  PO intake and protein intake in setting of old age and decreased hunger/thirst. He is having issues with cooking related to his daughter wanting him to go to a  nursing home. States he may have to go back to cooking. Encouraged him to continue to drink boost multiple times per day and eat yogurt regularly.  Nutrition-Focused physical exam completed. Findings are severe fat depletion, moderate-severe muscle depletion, and no edema.   Medications reviewed and include:  Senokot-S Labs reviewed:  Na 132   Intake/Output Summary (Last 24 hours) at 01/03/17 1142 Last data filed at 01/03/17 0946  Gross per 24 hour  Intake              740 ml  Output             1800 ml  Net            -1060 ml    Diet Order:  Diet regular Room service appropriate? Yes; Fluid consistency: Thin Diet - low sodium heart healthy  Skin:  Wound (see comment) (Trauma wound to medial R Leg)  Last BM:  12/30/2016  Height:   Ht Readings from Last 1 Encounters:  01/02/17  (1.676 m)    Weight:   Wt Readings from Last 1 Encounters:  01/03/17 121 lb (54.9 kg)    Ideal Body Weight:  64.54 kg  BMI:  Body mass index is 19.53 kg/m.  Estimated Nutritional Needs:   Kcal:  1300-1450 calories (MSJ x1.2-1.3)  Protein:  83-94 grams (1.5-1.7g/kg)  Fluid:  >1.5L  EDUCATION NEEDS:   Education needs addressed  Dionne Ano. Tiahna Cure, MS, RD LDN Inpatient Clinical Dietitian Pager [phone]

## 2017-01-03 NOTE — Care Management Obs Status (Signed)
MEDICARE OBSERVATION STATUS NOTIFICATION   Patient Details  Name: Daniel Dickson MRN: [id number] Date of Birth: May 05, 1912   Medicare Observation Status Notification Given:  Yes Notice signed, one given to patient and the other to HIM for scanning   Eber Hong, RN 01/03/2017, 12:04 PM

## 2017-01-03 NOTE — Care Management (Signed)
Patient has had 5 admission and 3 ED visits in last 6 months.  Present with penile pain and shortness of breath. It is documented patient has chronic foley.  At last discharge there is an order for home health physical therapy but CM reaching out to [facility] to discuss as patient previous admission was also due to respiratory failure. Patient has been placed in observation.  Lives with daughter.  Has cane and walker. PCP Joen Laura.  No issues obtaining medications.  Patient is DNR.  May benefit from palliative care consult due to frequent admissions for goals of care. He has chronic oxygen with [facility].

## 2017-01-03 NOTE — Care Management (Signed)
Spoke with patient about frequent presentations to the ED.  He says he lives with his 63 some year old Downs Syndrome son.  Says his daughter Elease Hashimoto) does not live with him but he has to pay her 400 dollars to take his son to work.  He is adamant that CM not call his daughter about transportation home.  Chooses  EMS transport and verbalizes understanding that his insurance will not cover the cost and that he will be able to get into the home.  Spoke with [facility] regarding patient's frequent presentations and the need to add SW to home health referral to assess home situation, assist with applications for transportation assist.  Suggested  discussion with patient and his daughter due to apparent reported discord between the daughter and patient.

## 2017-01-04 NOTE — Discharge Summary (Signed)
[facility] [place] at [facility]   PATIENT NAME: Daniel Dickson    MR#:  [id number]  DATE OF BIRTH:  09/02/1912  DATE OF ADMISSION:  01/02/2017 ADMITTING PHYSICIAN: Milagros Loll, MD  DATE OF DISCHARGE: 01/03/2017  2:54 PM  PRIMARY CARE PHYSICIAN: [name], MD    ADMISSION DIAGNOSIS:  Shortness of breath [R06.02] Acute pulmonary edema (HCC) [J81.0] Urinary tract infection without hematuria, site unspecified [N39.0]  DISCHARGE DIAGNOSIS:  Active Problems:   CHF (congestive heart failure) (HCC)   SECONDARY DIAGNOSIS:   Past Medical History:  Diagnosis Date  . Anxiety   . COPD (chronic obstructive pulmonary disease) (HCC)   . Coronary artery disease    a. 2012 s/p MI/cardiac arrest--> PCI mRCA.  Marland Kitchen Hypertension   . Hypertensive heart disease   . Mixed hyperlipidemia   . PTSD (post-traumatic stress disorder)    with headaches  . Systolic CHF [facility])     HOSPITAL COURSE:   83 year old male with past medical history of coronary artery disease, COPD, anxiety, hypertension, PTSD, chronic urinary retention with status post chronic indwelling Foley who presented to the hospital due to shortness of breath.  1. Acute respiratory failure with hypoxia-this was secondary to mild CHF. Patient was diuresed with IV Lasix, shortness of breath and respiratory status is significantly improved and therefore is being discharged home. -Patient will be discharged on some low-dose Lasix.  2. CHF-acute on chronic diastolic CHF. Patient was diuresed with IV Lasix and has improved. He is not being discharged on low-dose oral Lasix.  3. Urinary tract infection-patient was noted to have an enterococcal urinary tract infection based on the urine cultures 2 days ago prior to admission. It was sensitive to ampicillin therefore patient was discharged on a 7 day course of amoxicillin.  4. COPD-patient had no acute exacerbation. He is artery on chronic oxygen at home and he will  continue that. -We will continue his Spiriva, albuterol inhaler as needed.  5. History of urinary incontinence-patient will continue his oxybutynin. Patient is a chronic indwelling Foley.    DISCHARGE CONDITIONS:   Stable  CONSULTS OBTAINED:    DRUG ALLERGIES:   Allergies  Allergen Reactions  . Iodine (Kelp) [Iodine] Other (See Comments)    Other reaction(s): Other (See Comments) GI Upset    DISCHARGE MEDICATIONS:   Allergies as of 01/03/2017      Reactions   Iodine (kelp) [iodine] Other (See Comments)   Other reaction(s): Other (See Comments) GI Upset      Medication List    STOP taking these medications   isosorbide mononitrate 30 MG 24 hr tablet Commonly known as:  IMDUR   pantoprazole 40 MG tablet Commonly known as:  PROTONIX   predniSONE 10 MG tablet Commonly known as:  DELTASONE   senna-docusate 8.6-50 MG tablet Commonly known as:  Senokot-S     TAKE these medications   acetaminophen 500 MG tablet Commonly known as:  TYLENOL Take 500 mg by mouth daily as needed for mild pain or moderate pain.   amoxicillin 500 MG capsule Commonly known as:  AMOXIL Take 1 capsule (500 mg total) by mouth 3 (three) times daily.   benzonatate 100 MG capsule Commonly known as:  TESSALON Take 1 capsule (100 mg total) by mouth 3 (three) times daily.   doxycycline 100 MG tablet Commonly known as:  VIBRA-TABS Take 1 tablet (100 mg total) by mouth every 12 (twelve) hours.   feeding supplement Liqd Take 1 Container by mouth  3 (three) times daily between meals.   furosemide 40 MG tablet Commonly known as:  LASIX Take 0.5 tablets (20 mg total) by mouth daily.   magnesium hydroxide 400 MG/5ML suspension Commonly known as:  MILK OF MAGNESIA Take 30 mLs by mouth 2 (two) times daily as needed for mild constipation or moderate constipation.   Melatonin 5 MG Caps Take 10 mg by mouth at bedtime.   nitroGLYCERIN 0.4 MG SL tablet Commonly known as:  NITROSTAT Place 0.4  mg under the tongue every 5 (five) minutes as needed for chest pain. Reported on 10/19/2015   oxybutynin 5 MG 24 hr tablet Commonly known as:  DITROPAN-XL Take 5 mg by mouth daily as needed.   PROAIR HFA 108 (90 Base) MCG/ACT inhaler Generic drug:  albuterol Inhale 2 puffs into the lungs every 6 (six) hours as needed. For wheezing.   albuterol (2.5 MG/3ML) 0.083% nebulizer solution Commonly known as:  PROVENTIL Take 3 mLs (2.5 mg total) by nebulization every 4 (four) hours as needed for wheezing or shortness of breath.   tiotropium 18 MCG inhalation capsule Commonly known as:  SPIRIVA Place 1 capsule (18 mcg total) into inhaler and inhale every evening.            Discharge Care Instructions        Start     Ordered   01/04/17 0000  furosemide (LASIX) 40 MG tablet  Daily     01/03/17 1101   01/03/17 0000  Activity as tolerated - No restrictions     01/03/17 1058   01/03/17 0000  Diet - low sodium heart healthy     01/03/17 1058   01/03/17 0000  amoxicillin (AMOXIL) 500 MG capsule  3 times daily     01/03/17 1400        DISCHARGE INSTRUCTIONS:   DIET:  Cardiac diet  DISCHARGE CONDITION:  Stable  ACTIVITY:  Activity as tolerated  OXYGEN:  Home Oxygen: Yes.     Oxygen Delivery: 2-3 liters/min via Patient connected to nasal cannula oxygen  DISCHARGE LOCATION:  Home with home health nursing, physical therapy, home health aide, social work   If you experience worsening of your admission symptoms, develop shortness of breath, life threatening emergency, suicidal or homicidal thoughts you must seek medical attention immediately by calling 911 or calling your MD immediately  if symptoms less severe.  You Must read complete instructions/literature along with all the possible adverse reactions/side effects for all the Medicines you take and that have been prescribed to you. Take any new Medicines after you have completely understood and accpet all the possible  adverse reactions/side effects.   Please note  You were cared for by a hospitalist during your hospital stay. If you have any questions about your discharge medications or the care you received while you were in the hospital after you are discharged, you can call the unit and asked to speak with the hospitalist on call if the hospitalist that took care of you is not available. Once you are discharged, your primary care physician will handle any further medical issues. Please note that NO REFILLS for any discharge medications will be authorized once you are discharged, as it is imperative that you return to your primary care physician (or establish a relationship with a primary care physician if you do not have one) for your aftercare needs so that they can reassess your need for medications and monitor your lab values.     Today  VITAL SIGNS:  Blood pressure 103/62, pulse 94, temperature 98.3 F (36.8 C), resp. rate 19, height  (1.676 m), weight 54.9 kg (121 lb), SpO2 96 %.  I/O:  No intake or output data in the 24 hours ending 01/04/17 1559  PHYSICAL EXAMINATION:  GENERAL:  83 y.o.-year-old patient lying in the bed with no acute distress.  EYES: Pupils equal, round, reactive to light and accommodation. No scleral icterus. Extraocular muscles intact.  HEENT: Head atraumatic, normocephalic. Oropharynx and nasopharynx clear.  NECK:  Supple, no jugular venous distention. No thyroid enlargement, no tenderness.  LUNGS: Normal breath sounds bilaterally, no wheezing, minimal rales @ bases, No rhonchi. No use of accessory muscles of respiration.  CARDIOVASCULAR: S1, S2 normal. No murmurs, rubs, or gallops.  ABDOMEN: Soft, non-tender, non-distended. Bowel sounds present. No organomegaly or mass.  EXTREMITIES: No pedal edema, cyanosis, or clubbing.  NEUROLOGIC: Cranial nerves II through XII are intact. No focal motor or sensory defecits b/l.  PSYCHIATRIC: The patient is alert and oriented  x 3.  SKIN: No obvious rash, lesion, or ulcer.   DATA REVIEW:   CBC  Recent Labs Lab 01/02/17 1432  WBC 8.9  HGB 12.8*  HCT 36.8*  PLT 194    Chemistries   Recent Labs Lab 12/31/16 1316  01/03/17 0447  NA 129*  < > 132*  K 4.2  < > 4.2  CL 98*  < > 99*  CO2 23  < > 25  GLUCOSE 114*  < > 142*  BUN 24*  < > 29*  CREATININE 0.94  < > 0.86  CALCIUM 8.6*  < > 9.1  AST 26  --   --   ALT 22  --   --   ALKPHOS 41  --   --   BILITOT 0.8  --   --   < > = values in this interval not displayed.  Cardiac Enzymes  Recent Labs Lab 01/02/17 1432  TROPONINI 0.03*    Microbiology Results  Results for orders placed or performed during the hospital encounter of 12/31/16  Urine Culture     Status: Abnormal   Collection Time: 12/31/16  1:16 PM  Result Value Ref Range Status   Specimen Description URINE, RANDOM  Final   Special Requests NONE  Final   Culture >=100,000 COLONIES/mL ENTEROCOCCUS FAECALIS (A)  Final   Report Status 01/02/2017 FINAL  Final   Organism ID, Bacteria ENTEROCOCCUS FAECALIS (A)  Final      Susceptibility   Enterococcus faecalis - MIC*    AMPICILLIN <=2 SENSITIVE Sensitive     LEVOFLOXACIN >=8 RESISTANT Resistant     NITROFURANTOIN <=16 SENSITIVE Sensitive     VANCOMYCIN 1 SENSITIVE Sensitive     * >=100,000 COLONIES/mL ENTEROCOCCUS FAECALIS    RADIOLOGY:  No results found.    Management plans discussed with the patient, family and they are in agreement.    Questions for Most Recent Historical Code Status (Order [id number])    Question Answer Comment   In the event of cardiac or respiratory ARREST Do not call a "code blue"    In the event of cardiac or respiratory ARREST Do not perform Intubation, CPR, defibrillation or ACLS    In the event of cardiac or respiratory ARREST Use medication by any route, position, wound care, and other measures to relive pain and suffering. May use oxygen, suction and manual treatment of airway obstruction as  needed for comfort.       TOTAL TIME  TAKING CARE OF THIS PATIENT: 40 minutes.    Houston Siren M.D on 01/04/2017 at 3:59 PM  Between 7am to 6pm - Pager - [phone]  After 6pm go to [email or web address] - Social research officer, government  [facility]  Office  [phone]  CC: Primary care physician; [name], MD

## 2017-01-14 ENCOUNTER — Emergency Department
Admission: EM | Admit: 2017-01-14 | Discharge: 2017-01-14 | Disposition: A | Discharge status: Home / Self Care | Payer: Medicare PPO | Attending: Emergency Medicine | Admitting: Emergency Medicine

## 2017-01-14 DIAGNOSIS — I5020 Unspecified systolic (congestive) heart failure: Secondary | ICD-10-CM | POA: Insufficient documentation

## 2017-01-14 DIAGNOSIS — Y829 Unspecified medical devices associated with adverse incidents: Secondary | ICD-10-CM | POA: Diagnosis not present

## 2017-01-14 DIAGNOSIS — I110 Hypertensive heart disease with heart failure: Secondary | ICD-10-CM | POA: Insufficient documentation

## 2017-01-14 DIAGNOSIS — I2510 Atherosclerotic heart disease of native coronary artery without angina pectoris: Secondary | ICD-10-CM | POA: Insufficient documentation

## 2017-01-14 DIAGNOSIS — J449 Chronic obstructive pulmonary disease, unspecified: Secondary | ICD-10-CM | POA: Diagnosis not present

## 2017-01-14 DIAGNOSIS — N4889 Other specified disorders of penis: Secondary | ICD-10-CM | POA: Diagnosis present

## 2017-01-14 DIAGNOSIS — Z955 Presence of coronary angioplasty implant and graft: Secondary | ICD-10-CM | POA: Diagnosis not present

## 2017-01-14 DIAGNOSIS — Z79899 Other long term (current) drug therapy: Secondary | ICD-10-CM | POA: Diagnosis not present

## 2017-01-14 DIAGNOSIS — T839XXA Unspecified complication of genitourinary prosthetic device, implant and graft, initial encounter: Secondary | ICD-10-CM

## 2017-01-14 DIAGNOSIS — Z87891 Personal history of nicotine dependence: Secondary | ICD-10-CM | POA: Diagnosis not present

## 2017-01-14 LAB — URINALYSIS, COMPLETE (UACMP) WITH MICROSCOPIC
Bilirubin Urine: NEGATIVE
Glucose, UA: NEGATIVE mg/dL
Ketones, ur: NEGATIVE mg/dL
Leukocytes, UA: NEGATIVE
Nitrite: NEGATIVE
PROTEIN: NEGATIVE mg/dL
SPECIFIC GRAVITY, URINE: 1.009 (ref 1.005–1.030)
SQUAMOUS EPITHELIAL / LPF: NONE SEEN
pH: 7 (ref 5.0–8.0)

## 2017-01-14 MED ORDER — LIDOCAINE HCL 2 % EX GEL
1.0000 "application " | Freq: Once | CUTANEOUS | Status: AC
  Administered 2017-01-14: 1 via URETHRAL

## 2017-01-14 MED ORDER — LIDOCAINE HCL 2 % EX GEL
CUTANEOUS | Status: AC
  Administered 2017-01-14: 1 via URETHRAL
  Filled 2017-01-14: qty 10

## 2017-01-14 NOTE — ED Notes (Signed)
Previous foley removed and lidocaine jelly placed by this RN and Morrie Sheldon, Charity fundraiser. Explained to patient would allow lidocaine to sit for approx 10 minutes prior to inserting new foley. Pt states understanding.

## 2017-01-14 NOTE — ED Notes (Signed)
Pt resting in bed with eyes closed, respirations even and unlabored. VSS at this time.

## 2017-01-14 NOTE — ED Provider Notes (Signed)
[facility] Emergency Department Provider Note   ____________________________________________    I have reviewed the triage vital signs and the nursing notes.   HISTORY  Chief Complaint Groin Pain     HPI Daniel Dickson is a 83 y.o. male with a chronic indwelling Foley who presents with complaints of penis pain. Patient reports he felt well last night but at 2 AM he woke up with a painful sensation in his penis. He apparently has had this many times before. He tried to deal with it for several hours but became too severe and he called 911. As he was talking with 911 he apparently slid out of his recliner and suffered a skin tear to his right arm. No other injuries. No f/c. Foley changed 1 week ago.    Past Medical History:  Diagnosis Date  . Anxiety   . COPD (chronic obstructive pulmonary disease) (HCC)   . Coronary artery disease    a. 2012 s/p MI/cardiac arrest--> PCI mRCA.  Marland Kitchen Hypertension   . Hypertensive heart disease   . Mixed hyperlipidemia   . PTSD (post-traumatic stress disorder)    with headaches  . Systolic CHF [facility])     Patient Active Problem List   Diagnosis Date Noted  . CHF (congestive heart failure) (HCC) 01/02/2017  . Acute on chronic respiratory failure (HCC) 11/11/2016  . Open wound of lower limb with tendon involvement, right, subsequent encounter   . Cellulitis of right leg 10/18/2016  . DNR (do not resuscitate) discussion   . Palliative care by specialist   . Adult failure to thrive   . Gross hematuria   . Acute respiratory failure (HCC) 02/18/2016  . Urinary retention 11/10/2015  . Hyperkalemia 11/02/2015  . Hypotension 11/02/2015  . Dysphagia, pharyngoesophageal phase 11/02/2015  . Esophageal spasm 11/02/2015  . Constipation 11/02/2015  . SIADH (syndrome of inappropriate ADH production) (HCC) 11/02/2015  . Urinary retention due to benign prostatic hyperplasia 11/02/2015  . Chronic indwelling Foley catheter  11/02/2015  . Bacteriuria with pyuria 11/02/2015  . Protein-calorie malnutrition, severe 10/30/2015  . ARF (acute renal failure) (HCC) 10/29/2015  . Dyspnea 10/07/2015  . Hyponatremia 06/06/2015  . Coronary artery disease   . Hypertensive heart disease   . Mixed hyperlipidemia   . COPD (chronic obstructive pulmonary disease) (HCC)   . COPD exacerbation (HCC) 01/11/2015  . Moderate COPD (chronic obstructive pulmonary disease) (HCC) 12/08/2014  . Fall 09/03/2014  . Hip fracture (HCC) 09/03/2014  . PTSD (post-traumatic stress disorder) 09/03/2014  . Bilateral leg edema 09/03/2014  . SOB (shortness of breath) 10/31/2012  . Essential hypertension 10/31/2012  . CAD (coronary artery disease) 10/31/2012  . Hyperlipidemia 10/31/2012  . Tachycardia 10/31/2012    Past Surgical History:  Procedure Laterality Date  . CATARACT EXTRACTION    . CHOLECYSTECTOMY    . CORONARY ANGIOPLASTY     with stent; [facility]  . HAND SURGERY    . HEMORRHOID SURGERY    . HIP SURGERY    . VASCULAR SURGERY     stent R femoral artery    Prior to Admission medications   Medication Sig Start Date End Date Taking? Authorizing Provider  acetaminophen (TYLENOL) 500 MG tablet Take 500 mg by mouth daily as needed for mild pain or moderate pain.    Yes [provider]  albuterol (PROAIR HFA) 108 (90 BASE) MCG/ACT inhaler Inhale 2 puffs into the lungs every 6 (six) hours as needed. For wheezing.   Yes  [provider]  albuterol (PROVENTIL) (2.5 MG/3ML) 0.083% nebulizer solution Take 3 mLs (2.5 mg total) by nebulization every 4 (four) hours as needed for wheezing or shortness of breath. 09/28/16  Yes Sudini, Wardell Heath, MD  feeding supplement (BOOST / RESOURCE BREEZE) LIQD Take 1 Container by mouth 3 (three) times daily between meals. 11/02/15  Yes Katharina Caper, MD  furosemide (LASIX) 40 MG tablet Take 0.5 tablets (20 mg total) by mouth daily. 01/04/17  Yes Sainani, Rolly Pancake, MD  magnesium hydroxide (MILK OF  MAGNESIA) 400 MG/5ML suspension Take 30 mLs by mouth 2 (two) times daily as needed for mild constipation or moderate constipation. 11/02/15  Yes Katharina Caper, MD  Melatonin 5 MG CAPS Take 10 mg by mouth at bedtime.   Yes [provider]  nitroGLYCERIN (NITROSTAT) 0.4 MG SL tablet Place 0.4 mg under the tongue every 5 (five) minutes as needed for chest pain. Reported on 10/19/2015   Yes [provider]  oxybutynin (DITROPAN-XL) 5 MG 24 hr tablet Take 5 mg by mouth daily as needed.   Yes [provider]  tiotropium (SPIRIVA) 18 MCG inhalation capsule Place 1 capsule (18 mcg total) into inhaler and inhale every evening. 10/09/15 01/14/18 Yes Mody, Patricia Pesa, MD  benzonatate (TESSALON) 100 MG capsule Take 1 capsule (100 mg total) by mouth 3 (three) times daily. Patient not taking: Reported on 01/14/2017 11/13/16   Enedina Finner, MD  doxycycline (VIBRA-TABS) 100 MG tablet Take 1 tablet (100 mg total) by mouth every 12 (twelve) hours. Patient not taking: Reported on 01/03/2017 11/13/16   Enedina Finner, MD     Allergies Iodine (kelp) [iodine]  Family History  Problem Relation Age of Onset  . COPD Father   . Bladder Cancer Neg Hx   . Kidney cancer Neg Hx   . Prostate cancer Neg Hx     Social History Social History  Substance Use Topics  . Smoking status: Former Smoker    Packs/day: 3.00    Years: 40.00    Types: Cigarettes  . Smokeless tobacco: Never Used     Comment: quit 62 years  . Alcohol use 1.8 oz/week    3 Glasses of wine per week     Comment: wine at night.     Review of Systems  Constitutional: No fever/chills  ENT: No neck pain   Gastrointestinal: No abdominal pain.  No nausea, no vomiting.   Genitourinary: Foley as above Musculoskeletal: Negative for back pain.no hip pain Skin: skin tear as above Neurological: Negative for headaches , no neuro deficits    ____________________________________________   PHYSICAL EXAM:  VITAL SIGNS: ED Triage Vitals   Enc Vitals Group     BP 01/14/17 0651 (!) 184/94     Pulse Rate 01/14/17 0651 90     Resp 01/14/17 0651 16     Temp --      Temp Source 01/14/17 0651 Oral     SpO2 01/14/17 0651 98 %     Weight 01/14/17 0653 63.7 kg (140 lb 6.9 oz)     Height 01/14/17 0653 1.676 m ( )     Head Circumference --      Peak Flow --      Pain Score 01/14/17 0651 9     Pain Loc --      Pain Edu? --      Excl. in GC? --     Constitutional: Alert and oriented. No acute distress. Pleasant and interactive Eyes: Conjunctivae are normal.  Head: Atraumatic. Nose: No congestion/rhinnorhea. Mouth/Throat: Mucous membranes are moist.   Cardiovascular: Normal rate, regular rhythm.  Respiratory: Normal respiratory effort.  No retractions. Genitourinary: normal-appearing penis, uncircumcised, foley catheter in place, no evidence of trauma to the area Musculoskeletal: moves all extremities without pain.  Neurologic:  Normal speech and language. No gross focal neurologic deficits are appreciated.   Skin:  Skin is warm, dry. Skin tear right upper arm   ____________________________________________   LABS (all labs ordered are listed, but only abnormal results are displayed)  Labs Reviewed  URINALYSIS, COMPLETE (UACMP) WITH MICROSCOPIC - Abnormal; Notable for the following:       Result Value   Color, Urine YELLOW (*)    APPearance CLEAR (*)    Hgb urine dipstick LARGE (*)    Bacteria, UA RARE (*)    All other components within normal limits  URINE CULTURE   ____________________________________________  EKG   ____________________________________________  RADIOLOGY   ____________________________________________   PROCEDURES  Procedure(s) performed: No    Critical Care performed: No ____________________________________________   INITIAL IMPRESSION / ASSESSMENT AND PLAN / ED COURSE  Pertinent labs & imaging results that were available during my care of the patient were reviewed by me  and considered in my medical decision making (see chart for details).  Differential dx: UTI, foley catheter issue, trauma  Medical record review and patient has presented to the ED with similar pain in the past  Foley catheter noted to be kinked and after this was corrected patient had relief of his pain. Currently says he feels quite well and has no discomfort.   ----------------------------------------- 8:43 AM on 01/14/2017 -----------------------------------------  Patient reports the pain has started up again. He notes this has been going on for months area and he has not seen a urologist. I suspect he has a urethral ulceration or injury, he may need supra pubic catheter. We will replace his Foley with Urojet to try to provide relief   ____________________________________________   FINAL CLINICAL IMPRESSION(S) / ED DIAGNOSES  Final diagnoses:  Problem with Foley catheter, initial encounter [facility])      NEW MEDICATIONS STARTED DURING THIS VISIT:  Discharge Medication List as of 01/14/2017  8:53 AM       Note:  This document was prepared using Dragon voice recognition software and may include unintentional dictation errors.    Jene Every, MD 01/14/17 [phone]

## 2017-01-14 NOTE — ED Notes (Signed)
Report to megan, rn.  

## 2017-01-14 NOTE — ED Notes (Signed)
Pt c/o return of pain "right after you left". Pt also c/o pain with this RN draining urine from top port of foley catheter. Foley unclamped to allow for free drainage of urine. UA obtained and sent to lab for testing. Will continue to monitor pt to see if pain subsides.

## 2017-01-14 NOTE — ED Notes (Signed)
This RN received verbal permission from patient to call his daughter who is listed as emergency contact to pick him up as he is discharged. This RN contacted patient's daughter via the telephone number listed under demographics. Pt's daughter states that she is unable to pick him up because she "has an 1130 appointment" but that she will try to find someone to come get him. This RN requested that whoever she has come pick him up come into the ER that was D/C instructions can be reviewed with patient as well as family member. Pt's daughter states understanding then hung up.

## 2017-01-14 NOTE — ED Notes (Signed)
Pt's foley clamped to obtain urine specimen from top port, per MD okay to obtain urine for UA from pre-existing foley. Explained to patient why foley was clamped, pt states understanding.

## 2017-01-14 NOTE — ED Triage Notes (Signed)
Pt states he was calling 911 for groin and penis pain when he slid out of the kitchen chair resulting in a skin tear to right arm. Pt states "my thing hurts". Pt with indwelling urinary catheter, draining clear yellow urine. Last emptied around 2200 yesterday per pt.

## 2017-01-14 NOTE — Discharge Instructions (Signed)
Please follow up with urology for further evaluation of this recurrent issue

## 2017-01-16 LAB — URINE CULTURE: Culture: 10000 — AB

## 2017-01-17 NOTE — Progress Notes (Signed)
Recent Results (from the past 240 hour(s))  Urine Culture     Status: Abnormal   Collection Time: 01/14/17  7:45 AM  Result Value Ref Range Status   Specimen Description URINE, CATHETERIZED  Final   Special Requests NONE  Final   Culture 10,000 COLONIES/mL ENTEROCOCCUS FAECIUM (A)  Final   Report Status 01/16/2017 FINAL  Final   Organism ID, Bacteria ENTEROCOCCUS FAECIUM (A)  Final      Susceptibility   Enterococcus faecium - MIC*    AMPICILLIN >=32 RESISTANT Resistant     LEVOFLOXACIN >=8 RESISTANT Resistant     NITROFURANTOIN 256 RESISTANT Resistant     VANCOMYCIN <=0.5 SENSITIVE Sensitive     * 10,000 COLONIES/mL ENTEROCOCCUS FAECIUM   Sent results via fax to Dr Quillian Quince the patients PCP at the request of Dr Alphonzo Lemmings in the ED.  Luan Pulling, PharmD, MBA, [employer] Clinical Pharmacist [facility]

## 2017-01-19 ENCOUNTER — Observation Stay
Admission: EM | Admit: 2017-01-19 | Discharge: 2017-01-21 | Disposition: A | Discharge status: Home / Self Care | Payer: Medicare PPO | Attending: Internal Medicine | Admitting: Internal Medicine

## 2017-01-19 ENCOUNTER — Encounter: Payer: Self-pay | Admitting: Emergency Medicine

## 2017-01-19 ENCOUNTER — Emergency Department: Payer: Medicare PPO

## 2017-01-19 DIAGNOSIS — F419 Anxiety disorder, unspecified: Secondary | ICD-10-CM | POA: Insufficient documentation

## 2017-01-19 DIAGNOSIS — Z8674 Personal history of sudden cardiac arrest: Secondary | ICD-10-CM | POA: Diagnosis not present

## 2017-01-19 DIAGNOSIS — E782 Mixed hyperlipidemia: Secondary | ICD-10-CM | POA: Insufficient documentation

## 2017-01-19 DIAGNOSIS — I110 Hypertensive heart disease with heart failure: Secondary | ICD-10-CM | POA: Insufficient documentation

## 2017-01-19 DIAGNOSIS — I452 Bifascicular block: Secondary | ICD-10-CM | POA: Insufficient documentation

## 2017-01-19 DIAGNOSIS — N281 Cyst of kidney, acquired: Secondary | ICD-10-CM | POA: Diagnosis not present

## 2017-01-19 DIAGNOSIS — I5020 Unspecified systolic (congestive) heart failure: Secondary | ICD-10-CM | POA: Insufficient documentation

## 2017-01-19 DIAGNOSIS — Z9049 Acquired absence of other specified parts of digestive tract: Secondary | ICD-10-CM | POA: Diagnosis not present

## 2017-01-19 DIAGNOSIS — I252 Old myocardial infarction: Secondary | ICD-10-CM | POA: Diagnosis not present

## 2017-01-19 DIAGNOSIS — K5730 Diverticulosis of large intestine without perforation or abscess without bleeding: Secondary | ICD-10-CM | POA: Insufficient documentation

## 2017-01-19 DIAGNOSIS — Z79899 Other long term (current) drug therapy: Secondary | ICD-10-CM | POA: Insufficient documentation

## 2017-01-19 DIAGNOSIS — J441 Chronic obstructive pulmonary disease with (acute) exacerbation: Secondary | ICD-10-CM | POA: Diagnosis present

## 2017-01-19 DIAGNOSIS — J439 Emphysema, unspecified: Secondary | ICD-10-CM | POA: Diagnosis not present

## 2017-01-19 DIAGNOSIS — Z888 Allergy status to other drugs, medicaments and biological substances status: Secondary | ICD-10-CM | POA: Diagnosis not present

## 2017-01-19 DIAGNOSIS — I700 Atherosclerosis of aorta: Secondary | ICD-10-CM | POA: Insufficient documentation

## 2017-01-19 DIAGNOSIS — Z87891 Personal history of nicotine dependence: Secondary | ICD-10-CM | POA: Insufficient documentation

## 2017-01-19 DIAGNOSIS — R0602 Shortness of breath: Secondary | ICD-10-CM | POA: Diagnosis present

## 2017-01-19 DIAGNOSIS — R0600 Dyspnea, unspecified: Secondary | ICD-10-CM

## 2017-01-19 DIAGNOSIS — F4310 Post-traumatic stress disorder, unspecified: Secondary | ICD-10-CM | POA: Insufficient documentation

## 2017-01-19 DIAGNOSIS — I2510 Atherosclerotic heart disease of native coronary artery without angina pectoris: Secondary | ICD-10-CM | POA: Insufficient documentation

## 2017-01-19 LAB — CBC WITH DIFFERENTIAL/PLATELET
Basophils Absolute: 0.1 10*3/uL (ref 0–0.1)
Basophils Relative: 1 %
Eosinophils Absolute: 0.5 10*3/uL (ref 0–0.7)
Eosinophils Relative: 7 %
HEMATOCRIT: 36.1 % — AB (ref 40.0–52.0)
Hemoglobin: 12.2 g/dL — ABNORMAL LOW (ref 13.0–18.0)
LYMPHS ABS: 1.9 10*3/uL (ref 1.0–3.6)
LYMPHS PCT: 25 %
MCH: 31.5 pg (ref 26.0–34.0)
MCHC: 33.8 g/dL (ref 32.0–36.0)
MCV: 93.2 fL (ref 80.0–100.0)
MONO ABS: 0.6 10*3/uL (ref 0.2–1.0)
MONOS PCT: 8 %
NEUTROS ABS: 4.3 10*3/uL (ref 1.4–6.5)
Neutrophils Relative %: 59 %
Platelets: 161 10*3/uL (ref 150–440)
RBC: 3.87 MIL/uL — ABNORMAL LOW (ref 4.40–5.90)
RDW: 15 % — AB (ref 11.5–14.5)
WBC: 7.5 10*3/uL (ref 3.8–10.6)

## 2017-01-19 LAB — COMPREHENSIVE METABOLIC PANEL
ALT: 19 U/L (ref 17–63)
AST: 25 U/L (ref 15–41)
Albumin: 3 g/dL — ABNORMAL LOW (ref 3.5–5.0)
Alkaline Phosphatase: 40 U/L (ref 38–126)
Anion gap: 8 (ref 5–15)
BILIRUBIN TOTAL: 0.6 mg/dL (ref 0.3–1.2)
BUN: 22 mg/dL — AB (ref 6–20)
CO2: 26 mmol/L (ref 22–32)
CREATININE: 0.98 mg/dL (ref 0.61–1.24)
Calcium: 8.7 mg/dL — ABNORMAL LOW (ref 8.9–10.3)
Chloride: 97 mmol/L — ABNORMAL LOW (ref 101–111)
Glucose, Bld: 120 mg/dL — ABNORMAL HIGH (ref 65–99)
POTASSIUM: 4.3 mmol/L (ref 3.5–5.1)
Sodium: 131 mmol/L — ABNORMAL LOW (ref 135–145)
TOTAL PROTEIN: 5.8 g/dL — AB (ref 6.5–8.1)

## 2017-01-19 LAB — BRAIN NATRIURETIC PEPTIDE: B Natriuretic Peptide: 113 pg/mL — ABNORMAL HIGH (ref 0.0–100.0)

## 2017-01-19 LAB — FIBRIN DERIVATIVES D-DIMER (ARMC ONLY)

## 2017-01-19 LAB — LACTIC ACID, PLASMA: Lactic Acid, Venous: 1.6 mmol/L (ref 0.5–1.9)

## 2017-01-19 LAB — TROPONIN I
TROPONIN I: 0.03 ng/mL — AB (ref <0.03)
TROPONIN I: 0.03 ng/mL — AB (ref <0.03)

## 2017-01-19 MED ORDER — IOPAMIDOL (ISOVUE-370) INJECTION 76%
75.0000 mL | Freq: Once | INTRAVENOUS | Status: AC | PRN
  Administered 2017-01-19: 75 mL via INTRAVENOUS

## 2017-01-19 MED ORDER — IOPAMIDOL (ISOVUE-300) INJECTION 61%
30.0000 mL | Freq: Once | INTRAVENOUS | Status: AC | PRN
  Administered 2017-01-19: 30 mL via ORAL

## 2017-01-19 MED ORDER — IPRATROPIUM-ALBUTEROL 0.5-2.5 (3) MG/3ML IN SOLN
3.0000 mL | Freq: Once | RESPIRATORY_TRACT | Status: AC
  Administered 2017-01-19: 3 mL via RESPIRATORY_TRACT
  Filled 2017-01-19: qty 3

## 2017-01-19 NOTE — ED Notes (Signed)
Pt up to ambulate for O2 monitoring on room air. Pt ambulatory around room with drop in O2 of 88% on room air. Pt reports SOB and feeling of "winded."

## 2017-01-19 NOTE — ED Triage Notes (Signed)
Pt in via [facility] from home with complaints of shortness of breath, using nebulizer at home without relief.  Pt received 1 duoneb and 1 albuterol via EMS, pt with labored breathing upon arrival, able to speak in full, clear sentences, NAD noted at this time.

## 2017-01-19 NOTE — ED Notes (Signed)
Pending CT scan. Will be available at apx 2145

## 2017-01-19 NOTE — ED Provider Notes (Signed)
[facility] Emergency Department Provider Note   ____________________________________________   First MD Initiated Contact with Patient 01/19/17 1524     (approximate)  I have reviewed the triage vital signs and the nursing notes.   HISTORY  Chief Complaint Shortness of Breath    HPI Daniel Dickson is a 83 y.o. male Patient reports feeling short of breath. Took an albuterol inhaler at home didn't seem to get better so he came in. He is not running a fever or coughing. He complains of lack of appetite says he hasn't eaten since yesterday but is not hungry. Says he's lost 20 some pounds in the last month or so. Also has episodes of spasming in his groin he reports that he's had this pain off and on for about a year. He had his Foley catheter in for about a year.   Past Medical History:  Diagnosis Date  . Anxiety   . COPD (chronic obstructive pulmonary disease) (HCC)   . Coronary artery disease    a. 2012 s/p MI/cardiac arrest--> PCI mRCA.  Marland Kitchen Hypertension   . Hypertensive heart disease   . Mixed hyperlipidemia   . PTSD (post-traumatic stress disorder)    with headaches  . Systolic CHF [facility])     Patient Active Problem List   Diagnosis Date Noted  . CHF (congestive heart failure) (HCC) 01/02/2017  . Acute on chronic respiratory failure (HCC) 11/11/2016  . Open wound of lower limb with tendon involvement, right, subsequent encounter   . Cellulitis of right leg 10/18/2016  . DNR (do not resuscitate) discussion   . Palliative care by specialist   . Adult failure to thrive   . Gross hematuria   . Acute respiratory failure (HCC) 02/18/2016  . Urinary retention 11/10/2015  . Hyperkalemia 11/02/2015  . Hypotension 11/02/2015  . Dysphagia, pharyngoesophageal phase 11/02/2015  . Esophageal spasm 11/02/2015  . Constipation 11/02/2015  . SIADH (syndrome of inappropriate ADH production) (HCC) 11/02/2015  . Urinary retention due to benign prostatic  hyperplasia 11/02/2015  . Chronic indwelling Foley catheter 11/02/2015  . Bacteriuria with pyuria 11/02/2015  . Protein-calorie malnutrition, severe 10/30/2015  . ARF (acute renal failure) (HCC) 10/29/2015  . Dyspnea 10/07/2015  . Hyponatremia 06/06/2015  . Coronary artery disease   . Hypertensive heart disease   . Mixed hyperlipidemia   . COPD (chronic obstructive pulmonary disease) (HCC)   . COPD exacerbation (HCC) 01/11/2015  . Moderate COPD (chronic obstructive pulmonary disease) (HCC) 12/08/2014  . Fall 09/03/2014  . Hip fracture (HCC) 09/03/2014  . PTSD (post-traumatic stress disorder) 09/03/2014  . Bilateral leg edema 09/03/2014  . SOB (shortness of breath) 10/31/2012  . Essential hypertension 10/31/2012  . CAD (coronary artery disease) 10/31/2012  . Hyperlipidemia 10/31/2012  . Tachycardia 10/31/2012    Past Surgical History:  Procedure Laterality Date  . CATARACT EXTRACTION    . CHOLECYSTECTOMY    . CORONARY ANGIOPLASTY     with stent; [facility]  . HAND SURGERY    . HEMORRHOID SURGERY    . HIP SURGERY    . VASCULAR SURGERY     stent R femoral artery    Prior to Admission medications   Medication Sig Start Date End Date Taking? Authorizing Provider  acetaminophen (TYLENOL) 500 MG tablet Take 500 mg by mouth daily as needed for mild pain or moderate pain.    Yes [provider]  albuterol (PROAIR HFA) 108 (90 BASE) MCG/ACT inhaler Inhale 2 puffs into the lungs every  6 (six) hours as needed. For wheezing.   Yes [provider]  albuterol (PROVENTIL) (2.5 MG/3ML) 0.083% nebulizer solution Take 3 mLs (2.5 mg total) by nebulization every 4 (four) hours as needed for wheezing or shortness of breath. 09/28/16  Yes Sudini, Wardell Heath, MD  feeding supplement (BOOST / RESOURCE BREEZE) LIQD Take 1 Container by mouth 3 (three) times daily between meals. 11/02/15  Yes Katharina Caper, MD  furosemide (LASIX) 40 MG tablet Take 0.5 tablets (20 mg total) by mouth daily.  01/04/17  Yes Sainani, Rolly Pancake, MD  magnesium hydroxide (MILK OF MAGNESIA) 400 MG/5ML suspension Take 30 mLs by mouth 2 (two) times daily as needed for mild constipation or moderate constipation. 11/02/15  Yes Katharina Caper, MD  Melatonin 5 MG CAPS Take 10 mg by mouth at bedtime.   Yes [provider]  nitroGLYCERIN (NITROSTAT) 0.4 MG SL tablet Place 0.4 mg under the tongue every 5 (five) minutes as needed for chest pain. Reported on 10/19/2015   Yes [provider]  oxybutynin (DITROPAN-XL) 5 MG 24 hr tablet Take 5 mg by mouth daily as needed.   Yes [provider]  tiotropium (SPIRIVA) 18 MCG inhalation capsule Place 1 capsule (18 mcg total) into inhaler and inhale every evening. 10/09/15 01/14/18 Yes Mody, Patricia Pesa, MD  benzonatate (TESSALON) 100 MG capsule Take 1 capsule (100 mg total) by mouth 3 (three) times daily. Patient not taking: Reported on 01/14/2017 11/13/16   Enedina Finner, MD  doxycycline (VIBRA-TABS) 100 MG tablet Take 1 tablet (100 mg total) by mouth every 12 (twelve) hours. Patient not taking: Reported on 01/03/2017 11/13/16   Enedina Finner, MD    Allergies Iodine (kelp) [iodine]  Family History  Problem Relation Age of Onset  . COPD Father   . Bladder Cancer Neg Hx   . Kidney cancer Neg Hx   . Prostate cancer Neg Hx     Social History Social History  Substance Use Topics  . Smoking status: Former Smoker    Packs/day: 3.00    Years: 40.00    Types: Cigarettes  . Smokeless tobacco: Never Used     Comment: quit 62 years  . Alcohol use 1.8 oz/week    3 Glasses of wine per week     Comment: glass of wine at night    Review of Systems  Constitutional: No fever/chills Eyes: No visual changes. ENT: No sore throat. Cardiovascular: Denies chest pain. Respiratory: the history of present illness. Gastrointestinal:see history of present illness Genitourinary: Negative for dysuria. Musculoskeletal: Negative for back pain. Skin: Negative for  rash. Neurological: Negative for headaches, focal weakness  ____________________________________________   PHYSICAL EXAM:  VITAL SIGNS: ED Triage Vitals  Enc Vitals Group     BP --      Pulse Rate 01/19/17 1512 99     Resp 01/19/17 1512 12     Temp 01/19/17 1512 97.7 F (36.5 C)     Temp Source 01/19/17 1512 Oral     SpO2 01/19/17 1512 96 %     Weight 01/19/17 1515 126 lb (57.2 kg)     Height 01/19/17 1515  (1.676 m)     Head Circumference --      Peak Flow --      Pain Score 01/19/17 1511 0     Pain Loc --      Pain Edu? --      Excl. in GC? --     Constitutional: Alert and oriented. Well appearing and  in no acute distress. Eyes: Conjunctivae are normal. Head: Atraumatic. Nose: No congestion/rhinnorhea. Mouth/Throat: Mucous membranes are moist.  Oropharynx non-erythematous. Neck: No stridor.  Cardiovascular: Normal rate, regular rhythm. Grossly normal heart sounds.  Good peripheral circulation. Respiratory: Normal respiratory effort.  No retractions. Lungs CTAB. Gastrointestinal: Soft and nontender. No distention. No abdominal bruits. No CVA tenderness. Genitourinary: Foley in place normal uncircumcised male Musculoskeletal: No lower extremity tenderness nor edema.  No joint effusions. Neurologic:  Normal speech and language. No gross focal neurologic deficits are appreciated. Skin:  Skin is warm, dry and intact. No rash noted. Psychiatric: Mood and affect are normal. Speech and behavior are normal.  ____________________________________________   LABS (all labs ordered are listed, but only abnormal results are displayed)  Labs Reviewed  BRAIN NATRIURETIC PEPTIDE - Abnormal; Notable for the following:       Result Value   B Natriuretic Peptide 113.0 (*)    All other components within normal limits  CBC WITH DIFFERENTIAL/PLATELET - Abnormal; Notable for the following:    RBC 3.87 (*)    Hemoglobin 12.2 (*)    HCT 36.1 (*)    RDW 15.0 (*)    All other  components within normal limits  COMPREHENSIVE METABOLIC PANEL - Abnormal; Notable for the following:    Sodium 131 (*)    Chloride 97 (*)    Glucose, Bld 120 (*)    BUN 22 (*)    Calcium 8.7 (*)    Total Protein 5.8 (*)    Albumin 3.0 (*)    All other components within normal limits  TROPONIN I - Abnormal; Notable for the following:    Troponin I 0.03 (*)    All other components within normal limits  TROPONIN I - Abnormal; Notable for the following:    Troponin I 0.03 (*)    All other components within normal limits  FIBRIN DERIVATIVES D-DIMER ([facility] ONLY) - Abnormal; Notable for the following:    Fibrin derivatives D-dimer ([facility]) >7,500.00 (*)    All other components within normal limits  LACTIC ACID, PLASMA   ____________________________________________  EKG  ST-T wave changes right bundle branch block and left anterior hemiblock ____________________________________________  RADIOLOGY  radiology does not see anything on the chest x-ray that looks vacute ____________________________________________   PROCEDURES  Procedure(s) performed:  Procedures  Critical Care performed:   ____________________________________________   INITIAL IMPRESSION / ASSESSMENT AND PLAN / ED COURSE  nothing much turns up on CT scans however patient does desat with exertion with O2 sats going down 88. We'll recommend to the hospitalist that we watch him.        ____________________________________________   FINAL CLINICAL IMPRESSION(S) / ED DIAGNOSES  Final diagnoses:  Dyspnea, unspecified type  COPD exacerbation (HCC)      NEW MEDICATIONS STARTED DURING THIS VISIT:  New Prescriptions   No medications on file     Note:  This document was prepared using Dragon voice recognition software and may include unintentional dictation errors.    Arnaldo Natal, MD 01/19/17 [phone]

## 2017-01-20 DIAGNOSIS — R0602 Shortness of breath: Secondary | ICD-10-CM | POA: Diagnosis present

## 2017-01-20 LAB — URINALYSIS, ROUTINE W REFLEX MICROSCOPIC
BILIRUBIN URINE: NEGATIVE
Glucose, UA: NEGATIVE mg/dL
KETONES UR: NEGATIVE mg/dL
Leukocytes, UA: NEGATIVE
Nitrite: NEGATIVE
PH: 8 (ref 5.0–8.0)
PROTEIN: NEGATIVE mg/dL
SQUAMOUS EPITHELIAL / LPF: NONE SEEN
Specific Gravity, Urine: 1.011 (ref 1.005–1.030)

## 2017-01-20 LAB — BASIC METABOLIC PANEL
ANION GAP: 7 (ref 5–15)
BUN: 15 mg/dL (ref 6–20)
CHLORIDE: 100 mmol/L — AB (ref 101–111)
CO2: 28 mmol/L (ref 22–32)
Calcium: 8.9 mg/dL (ref 8.9–10.3)
Creatinine, Ser: 0.86 mg/dL (ref 0.61–1.24)
Glucose, Bld: 94 mg/dL (ref 65–99)
POTASSIUM: 4 mmol/L (ref 3.5–5.1)
Sodium: 135 mmol/L (ref 135–145)

## 2017-01-20 LAB — CBC
HCT: 38.6 % — ABNORMAL LOW (ref 40.0–52.0)
HEMOGLOBIN: 13.3 g/dL (ref 13.0–18.0)
MCH: 32 pg (ref 26.0–34.0)
MCHC: 34.5 g/dL (ref 32.0–36.0)
MCV: 92.8 fL (ref 80.0–100.0)
PLATELETS: 157 10*3/uL (ref 150–440)
RBC: 4.16 MIL/uL — AB (ref 4.40–5.90)
RDW: 14.7 % — ABNORMAL HIGH (ref 11.5–14.5)
WBC: 8.3 10*3/uL (ref 3.8–10.6)

## 2017-01-20 LAB — TROPONIN I: TROPONIN I: 0.03 ng/mL — AB (ref <0.03)

## 2017-01-20 MED ORDER — ACETAMINOPHEN 650 MG RE SUPP: 650.0000 mg | Freq: Four times a day (QID) | RECTAL | Status: DC | PRN

## 2017-01-20 MED ORDER — GUAIFENESIN ER 600 MG PO TB12
600.0000 mg | ORAL_TABLET | Freq: Two times a day (BID) | ORAL | Status: DC | PRN
  Administered 2017-01-21: 600 mg via ORAL
  Filled 2017-01-20: qty 1

## 2017-01-20 MED ORDER — FUROSEMIDE 20 MG PO TABS
20.0000 mg | ORAL_TABLET | Freq: Every day | ORAL | Status: DC
  Administered 2017-01-20 – 2017-01-21 (×2): 20 mg via ORAL
  Filled 2017-01-20 (×2): qty 1

## 2017-01-20 MED ORDER — ACETAMINOPHEN 325 MG PO TABS: 650.0000 mg | ORAL_TABLET | Freq: Four times a day (QID) | ORAL | Status: DC | PRN

## 2017-01-20 MED ORDER — SODIUM CHLORIDE 0.9 % IV SOLN: INTRAVENOUS | Status: DC

## 2017-01-20 MED ORDER — ONDANSETRON HCL 4 MG/2ML IJ SOLN: 4.0000 mg | Freq: Four times a day (QID) | INTRAMUSCULAR | Status: DC | PRN

## 2017-01-20 MED ORDER — OXYBUTYNIN CHLORIDE ER 5 MG PO TB24
5.0000 mg | ORAL_TABLET | Freq: Every day | ORAL | Status: DC
  Administered 2017-01-20: 5 mg via ORAL
  Filled 2017-01-20: qty 1

## 2017-01-20 MED ORDER — MAGNESIUM CITRATE PO SOLN
1.0000 | Freq: Once | ORAL | Status: DC | PRN
  Filled 2017-01-20: qty 296

## 2017-01-20 MED ORDER — HYDROCODONE-ACETAMINOPHEN 5-325 MG PO TABS
1.0000 | ORAL_TABLET | ORAL | Status: DC | PRN
  Administered 2017-01-20: 1 via ORAL
  Filled 2017-01-20 (×2): qty 1

## 2017-01-20 MED ORDER — ACETAMINOPHEN 500 MG PO TABS: 500.0000 mg | ORAL_TABLET | Freq: Every day | ORAL | Status: DC | PRN

## 2017-01-20 MED ORDER — ZOLPIDEM TARTRATE 5 MG PO TABS: 5.0000 mg | ORAL_TABLET | Freq: Every evening | ORAL | Status: DC | PRN

## 2017-01-20 MED ORDER — TIOTROPIUM BROMIDE MONOHYDRATE 18 MCG IN CAPS
18.0000 ug | ORAL_CAPSULE | Freq: Every evening | RESPIRATORY_TRACT | Status: DC
  Administered 2017-01-20: 18 ug via RESPIRATORY_TRACT
  Filled 2017-01-20: qty 5

## 2017-01-20 MED ORDER — IPRATROPIUM BROMIDE 0.02 % IN SOLN
0.5000 mg | Freq: Four times a day (QID) | RESPIRATORY_TRACT | Status: DC | PRN
  Administered 2017-01-21: 0.5 mg via RESPIRATORY_TRACT
  Filled 2017-01-20: qty 2.5

## 2017-01-20 MED ORDER — ONDANSETRON HCL 4 MG PO TABS: 4.0000 mg | ORAL_TABLET | Freq: Four times a day (QID) | ORAL | Status: DC | PRN

## 2017-01-20 MED ORDER — HYDRALAZINE HCL 20 MG/ML IJ SOLN
10.0000 mg | Freq: Four times a day (QID) | INTRAMUSCULAR | Status: DC | PRN
  Filled 2017-01-20: qty 1

## 2017-01-20 MED ORDER — BOOST / RESOURCE BREEZE PO LIQD
1.0000 | Freq: Three times a day (TID) | ORAL | Status: DC
  Administered 2017-01-20 – 2017-01-21 (×3): 1 via ORAL

## 2017-01-20 MED ORDER — MAGNESIUM HYDROXIDE 400 MG/5ML PO SUSP: 30.0000 mL | Freq: Two times a day (BID) | ORAL | Status: DC | PRN

## 2017-01-20 MED ORDER — ENOXAPARIN SODIUM 30 MG/0.3ML ~~LOC~~ SOLN
30.0000 mg | SUBCUTANEOUS | Status: DC
  Administered 2017-01-21: 30 mg via SUBCUTANEOUS
  Filled 2017-01-20 (×2): qty 0.3

## 2017-01-20 MED ORDER — BISACODYL 5 MG PO TBEC: 5.0000 mg | DELAYED_RELEASE_TABLET | Freq: Every day | ORAL | Status: DC | PRN

## 2017-01-20 MED ORDER — ALBUTEROL SULFATE (2.5 MG/3ML) 0.083% IN NEBU
2.5000 mg | INHALATION_SOLUTION | Freq: Four times a day (QID) | RESPIRATORY_TRACT | Status: DC | PRN
  Administered 2017-01-20 – 2017-01-21 (×4): 2.5 mg via RESPIRATORY_TRACT
  Filled 2017-01-20 (×5): qty 3

## 2017-01-20 MED ORDER — TAMSULOSIN HCL 0.4 MG PO CAPS
0.4000 mg | ORAL_CAPSULE | Freq: Every day | ORAL | Status: DC
  Administered 2017-01-20 – 2017-01-21 (×2): 0.4 mg via ORAL
  Filled 2017-01-20 (×2): qty 1

## 2017-01-20 MED ORDER — NITROGLYCERIN 0.4 MG SL SUBL: 0.4000 mg | SUBLINGUAL_TABLET | SUBLINGUAL | Status: DC | PRN

## 2017-01-20 MED ORDER — NYSTATIN 100000 UNIT/GM EX POWD
Freq: Once | CUTANEOUS | Status: AC
Start: 2017-01-20 — End: 2017-01-20
  Administered 2017-01-20: 02:00:00 via TOPICAL
  Filled 2017-01-20: qty 15

## 2017-01-20 MED ORDER — SENNOSIDES-DOCUSATE SODIUM 8.6-50 MG PO TABS: 1.0000 | ORAL_TABLET | Freq: Every evening | ORAL | Status: DC | PRN

## 2017-01-20 NOTE — Care Management (Signed)
CM spoke with [facility] representative last presentation regarding the frequent admission and ED visits.  Had requested palliative care consult on previous observation presentation.  Patient continues to present with vague symptoms that have not warranted admission so patient has had several observation stays.  Will speak with [facility] to again discuss plan to decrease patient ED visits and admissions.  may benefit from palliative care follow up in the home.

## 2017-01-20 NOTE — Progress Notes (Signed)
Palliative Medicine Team  Due to high volume of referrals, there is a delay seeing this patient. PMT not at [facility] over the weekend but will arrange goals of care with patient and family on Monday. Thank you for the opportunity to participate in the care of Mr. Vaccarella.  NO CHARGE  Vennie Homans, FNP-C Palliative Medicine Team  Phone: [phone] Fax: [phone]

## 2017-01-20 NOTE — Progress Notes (Signed)
Notified MD of elevated bp. Orders placed. Will continue to monitor and assess.

## 2017-01-20 NOTE — Progress Notes (Signed)
This is a 83 y.o. male with a history of COPD, CAD, hypertension, hyperlipidemia, PTSD, systolic congestive heart failure. He is admitted for shortness of breath. He complains of penis pain due to Foley. On O2 [employer] 2L. Vital signs stable. Physical examinations: Unremarkable except he is on chronic Foley catheter.  #. Shortness of breath, acute on chronic without hypoxia. No evidence to suggest pneumonia or heart failure Try to wean off O2 and DuoNeb therapy as needed Continue Spiriva Low dose steroid taper.  #. History of heart failure. Stable. Continue Lasix  Physical therapy suggested home health and PT.  Time spent about 25 minutes.

## 2017-01-20 NOTE — ED Notes (Signed)
Pt states not itching right now and wants to wait on medication.

## 2017-01-20 NOTE — Evaluation (Signed)
Physical Therapy Evaluation Patient Details Name: Daniel Dickson MRN: [id number] DOB: [birth year] Today's Date: 01/20/2017   History of Present Illness  Daniel Dickson is a 83 y.o. male with a known history of COPD, CAD, hypertension, hyperlipidemia, PTSD, systolic congestive heart failure presents to the emergency department for evaluation of multiple complaints. Patient states that he has chronic groin pain for over one year as well as wheezing and shortness of breath for more than one year.  Patient states that there was no change in his symptoms prompting an emergency department visit. He states that he has been in the emergency department for 5 times complaining of pain. Patient denies fevers/chills, weakness, dizziness, chest pain, shortness of breath, N/V/C/D, abdominal pain, dysuria/frequency, changes in mental status. Otherwise there has been no change in status. Patient has been taking medications as prescribed and there has been no recent change in medication or diet.  No recent antibiotics.  There has been no recent illness, hospitalizations, travel or sick contacts.  Clinical Impression  Pt admitted with above diagnosis. Pt currently with functional limitations due to the deficits listed below (see PT Problem List).  Pt is close to his baseline mobility. He initially refuses to work with therapy due to groin pain but eventually agrees. He is modified independent for bed mobility and transfers. He takes short shuffling steps to ambulate from bed to door and back. Pt reports DOE with ambulation however SaO2 is 95% on room air following ambulation. Pt demonstrates safe use of rolling walker and good stability. He refuses to walk farther at this time although he could likely ambulate farther. Reports limited ambulation at home. Pt is safe to return home with family support and resume HH PT. He will benefit from PT services to address deficits in strength, balance, and mobility in order to return  to full function at home.     Follow Up Recommendations Home health PT;Other (comment) (Resume HH PT at discharge)    Equipment Recommendations  None recommended by PT    Recommendations for Other Services       Precautions / Restrictions Precautions Precautions: Fall Restrictions Weight Bearing Restrictions: No      Mobility  Bed Mobility Overal bed mobility: Modified Independent             General bed mobility comments: Pt requires increased time, HOB elevated, and use of bed rails  Transfers Overall transfer level: Needs assistance Equipment used: Rolling walker (2 wheeled) Transfers: Sit to/from Stand Sit to Stand: Modified independent (Device/Increase time)         General transfer comment: Pt with safe hand placement during transfers without cues from therapist. Able to stabilize himself with UE support on rolling walker in standing  Ambulation/Gait Ambulation/Gait assistance: Min guard Ambulation Distance (Feet): 20 Feet Assistive device: Rolling walker (2 wheeled) Gait Pattern/deviations: Decreased step length - right;Decreased step length - left Gait velocity: Functional for household mobility Gait velocity interpretation: at or above normal speed for age/gender General Gait Details: Pt takes short shuffling steps to ambulate from bed to door and back. Pt reports DOE with ambulation however SaO2 is 95% on room air following ambulation. Pt demonstrates safe use of rolling walker and good stability. He refuses to walk farther at this time although he could likely ambulate farther. Reports limited ambulation at home  Stairs            Wheelchair Mobility    Modified Rankin (Stroke Patients Only)  Balance Overall balance assessment: Needs assistance Sitting-balance support: No upper extremity supported Sitting balance-Leahy Scale: Good     Standing balance support: Bilateral upper extremity supported Standing balance-Leahy Scale: Fair                                Pertinent Vitals/Pain Pain Assessment: 0-10 Pain Score: 9  Pain Location: Groin pain Pain Intervention(s): Premedicated before session    Home Living Family/patient expects to be discharged to:: Private residence Living Arrangements: Children;Other (Comment) (Son has down syndrome, daughter lives next door) Available Help at Discharge: Family;Personal care attendant Type of Home: House Home Access: Ramped entrance     Home Layout: One level Home Equipment: Environmental consultant - 2 wheels;Walker - 4 wheels;Grab bars - toilet;Wheelchair - manual;Bedside commode Additional Comments: Chronic foley    Prior Function Level of Independence: Needs assistance   Gait / Transfers Assistance Needed: Pt ambulates limited household distances with rolling walker. He has had a few falls.  ADL's / Homemaking Assistance Needed: Requiring assist with ADLs/IADLs from [facility] aid and family. Daughter lives near patient on his property  Comments: Pt modified independent with limited household mobility with RW. Lives with son with Down Syndrome. Daughter lives next door (frequently checks in and makes supper every night).      Hand Dominance   Dominant Hand: Right    Extremity/Trunk Assessment   Upper Extremity Assessment Upper Extremity Assessment: Overall WFL for tasks assessed    Lower Extremity Assessment Lower Extremity Assessment: Overall WFL for tasks assessed       Communication   Communication: HOH  Cognition Arousal/Alertness: Awake/alert Behavior During Therapy: WFL for tasks assessed/performed Overall Cognitive Status: No family/caregiver present to determine baseline cognitive functioning                                 General Comments: Oriented to person, place, and situation. Pt not questioned about time      General Comments      Exercises     Assessment/Plan    PT Assessment Patient needs continued PT services  PT Problem  List Decreased strength;Decreased activity tolerance;Decreased balance;Cardiopulmonary status limiting activity       PT Treatment Interventions DME instruction;Gait training;Stair training;Functional mobility training;Therapeutic activities;Therapeutic exercise;Balance training;Neuromuscular re-education;Patient/family education    PT Goals (Current goals can be found in the Care Plan section)  Acute Rehab PT Goals Patient Stated Goal: Return to prior function at home. "I want to get back home." PT Goal Formulation: With patient Time For Goal Achievement: 02/03/17 Potential to Achieve Goals: Good    Frequency Min 2X/week   Barriers to discharge Decreased caregiver support Lives with son who has Down Syndrome    Co-evaluation               AM-PAC PT "6 Clicks" Daily Activity  Outcome Measure Difficulty turning over in bed (including adjusting bedclothes, sheets and blankets)?: None Difficulty moving from lying on back to sitting on the side of the bed? : A Little Difficulty sitting down on and standing up from a chair with arms (e.g., wheelchair, bedside commode, etc,.)?: None Help needed moving to and from a bed to chair (including a wheelchair)?: None Help needed walking in hospital room?: A Little Help needed climbing 3-5 steps with a railing? : A Little 6 Click Score: 21    End of Session  Equipment Utilized During Treatment: Gait belt Activity Tolerance: Patient tolerated treatment well Patient left: in bed;with call bell/phone within reach;with bed alarm set   PT Visit Diagnosis: Unsteadiness on feet (R26.81);Muscle weakness (generalized) (M62.81);History of falling (Z91.81)    Time: 1610-9604 PT Time Calculation (min) (ACUTE ONLY): 22 min   Charges:   PT Evaluation $PT Eval Low Complexity: 1 Low     PT G Codes:   PT G-Codes **NOT FOR INPATIENT CLASS** Functional Assessment Tool Used: AM-PAC 6 Clicks Basic Mobility Functional Limitation: Mobility: Walking  and moving around Mobility: Walking and Moving Around Current Status (V4098): At least 20 percent but less than 40 percent impaired, limited or restricted Mobility: Walking and Moving Around Goal Status [phone]): At least 20 percent but less than 40 percent impaired, limited or restricted   Lynnea Maizes PT, DPT    Ladonya Jerkins 01/20/2017, 11:43 AM

## 2017-01-20 NOTE — Care Management Obs Status (Signed)
MEDICARE OBSERVATION STATUS NOTIFICATION   Patient Details  Name: Vester Balthazor MRN: [id number] Date of Birth: 1913-04-05   Medicare Observation Status Notification Given:  Yes Notice signed, one given to patient and the other to HIM for scanning.  No stickers available-   Eber Hong, RN 01/20/2017, 2:35 PM

## 2017-01-20 NOTE — ED Notes (Signed)
Admitting MD at bedside.

## 2017-01-20 NOTE — Progress Notes (Signed)
Pt arrived to floor via stretcher from ED. Pt A&O. Chronic foley in place. Oriented to room. Skin assess, yellow socks placed,.

## 2017-01-20 NOTE — H&P (Addendum)
History and Physical   [facility] - [place] @ [facility] Admission History and Physical [employer], D.O.    Patient Name: Daniel Dickson MR#: [id number] Date of Birth: 1912/06/11 Date of Admission: 01/19/2017  Referring MD/NP/PA: Dr. Darnelle Catalan Primary Care Physician: [name], MD  Chief Complaint:  Chief Complaint  Patient presents with  . Shortness of Breath  Please note the entire history is obtained from the patient's emergency department chart, emergency department provider. Patient's personal history is limited by poor historian   HPI: Daniel Dickson is a 83 y.o. male with a known history of COPD, CAD, hypertension, hyperlipidemia, PTSD, systolic congestive heart failure presents to the emergency department for evaluation of multiple complaints. On my questioning patient states that he has chronic groin pain for over one year as well as wheezing and shortness of breath for more than one year.  Patient states that there was no change in his symptoms prompting an emergency department visit. He states that he has been in the emergency department for 5 times complaining of pain.  Patient denies fevers/chills, weakness, dizziness, chest pain, shortness of breath, N/V/C/D, abdominal pain, dysuria/frequency, changes in mental status.    Otherwise there has been no change in status. Patient has been taking medication as prescribed and there has been no recent change in medication or diet.  No recent antibiotics.  There has been no recent illness, hospitalizations, travel or sick contacts.    EMS/ED Course: Patient received albuterol, nystatin. Medical admission has been requested for further management of shortness of breath.  Review of Systems: Now patient is a poor historian CONSTITUTIONAL: No fever/chills, fatigue, weakness, weight gain/loss, headache. EYES: No blurry or double vision. ENT: No tinnitus, postnasal drip, redness or soreness of the oropharynx. RESPIRATORY:  Positive chronic shortness of breath. No cough, wheeze.  No hemoptysis.  CARDIOVASCULAR: No chest pain, palpitations, syncope, orthopnea. No lower extremity edema.  GASTROINTESTINAL: No nausea, vomiting, abdominal pain, diarrhea, constipation.  No hematemesis, melena or hematochezia. GENITOURINARY: No dysuria, frequency, hematuria. ENDOCRINE: No polyuria or nocturia. No heat or cold intolerance. HEMATOLOGY: No anemia, bruising, bleeding. INTEGUMENTARY: No rashes, ulcers, lesions. MUSCULOSKELETAL: No arthritis, gout, dyspnea. Positive groin pain NEUROLOGIC: No numbness, tingling, ataxia, seizure-type activity, weakness. PSYCHIATRIC: No anxiety, depression, insomnia.   Past Medical History:  Diagnosis Date  . Anxiety   . COPD (chronic obstructive pulmonary disease) (HCC)   . Coronary artery disease    a. 2012 s/p MI/cardiac arrest--> PCI mRCA.  Marland Kitchen Hypertension   . Hypertensive heart disease   . Mixed hyperlipidemia   . PTSD (post-traumatic stress disorder)    with headaches  . Systolic CHF [facility])     Past Surgical History:  Procedure Laterality Date  . CATARACT EXTRACTION    . CHOLECYSTECTOMY    . CORONARY ANGIOPLASTY     with stent; [facility]  . HAND SURGERY    . HEMORRHOID SURGERY    . HIP SURGERY    . VASCULAR SURGERY     stent R femoral artery     reports that he has quit smoking. His smoking use included Cigarettes. He has a 120.00 pack-year smoking history. He has never used smokeless tobacco. He reports that he drinks about 1.8 oz of alcohol per week . He reports that he does not use drugs.  Allergies  Allergen Reactions  . Iodine (Kelp) [Iodine] Nausea And Vomiting    Family History  Problem Relation Age of Onset  . COPD Father   . Bladder Cancer  Neg Hx   . Kidney cancer Neg Hx   . Prostate cancer Neg Hx     Prior to Admission medications   Medication Sig Start Date End Date Taking? Authorizing Provider  acetaminophen (TYLENOL) 500 MG tablet Take 500 mg by mouth  daily as needed for mild pain or moderate pain.    Yes [provider]  albuterol (PROAIR HFA) 108 (90 BASE) MCG/ACT inhaler Inhale 2 puffs into the lungs every 6 (six) hours as needed. For wheezing.   Yes [provider]  albuterol (PROVENTIL) (2.5 MG/3ML) 0.083% nebulizer solution Take 3 mLs (2.5 mg total) by nebulization every 4 (four) hours as needed for wheezing or shortness of breath. 09/28/16  Yes Sudini, Wardell Heath, MD  feeding supplement (BOOST / RESOURCE BREEZE) LIQD Take 1 Container by mouth 3 (three) times daily between meals. 11/02/15  Yes Katharina Caper, MD  furosemide (LASIX) 40 MG tablet Take 0.5 tablets (20 mg total) by mouth daily. 01/04/17  Yes Sainani, Rolly Pancake, MD  magnesium hydroxide (MILK OF MAGNESIA) 400 MG/5ML suspension Take 30 mLs by mouth 2 (two) times daily as needed for mild constipation or moderate constipation. 11/02/15  Yes Katharina Caper, MD  Melatonin 5 MG CAPS Take 10 mg by mouth at bedtime.   Yes [provider]  nitroGLYCERIN (NITROSTAT) 0.4 MG SL tablet Place 0.4 mg under the tongue every 5 (five) minutes as needed for chest pain. Reported on 10/19/2015   Yes [provider]  oxybutynin (DITROPAN-XL) 5 MG 24 hr tablet Take 5 mg by mouth daily as needed.   Yes [provider]  tiotropium (SPIRIVA) 18 MCG inhalation capsule Place 1 capsule (18 mcg total) into inhaler and inhale every evening. 10/09/15 01/14/18 Yes Mody, Patricia Pesa, MD  benzonatate (TESSALON) 100 MG capsule Take 1 capsule (100 mg total) by mouth 3 (three) times daily. Patient not taking: Reported on 01/14/2017 11/13/16   Enedina Finner, MD  doxycycline (VIBRA-TABS) 100 MG tablet Take 1 tablet (100 mg total) by mouth every 12 (twelve) hours. Patient not taking: Reported on 01/03/2017 11/13/16   Enedina Finner, MD    Physical Exam: Vitals:   01/19/17 1800 01/19/17 1930 01/19/17 2300 01/20/17 0000  BP: (!) 181/107 (!) 152/92 (!) 167/97 (!) 188/93  Pulse: 88 (!) 104 87 88  Resp:  (!) (!) 26  Temp:      TempSrc:      SpO2: 99% 98% 99% 99%  Weight:      Height:        GENERAL: 83 y.o.-year-old Chronically ill-appearing, cachectic male patient, well-developed, well-nourished lying in the bed in no acute distress.  Pleasant and cooperative.   HEENT: Head atraumatic, normocephalic. Pupils equal. Mucus membranes moist. NECK: Supple, full range of motion.  CHEST: Diffuse wheezing, speaking 2 -3 word sentences, prolonged expiratory phase.  CARDIOVASCULAR: S1, S2 normal. Cap refill <2 seconds. Pulses intact distally.  ABDOMEN: Soft, nondistended, nontender. No rebound, guarding, rigidity. Normoactive bowel sounds present in all four quadrants.  GU: Foley catheter in place draining clear yellow urine EXTREMITIES: No pedal edema, cyanosis, or clubbing. No calf tenderness or Homan's sign.  NEUROLOGIC: The patient is alert and oriented x 3. Cranial nerves II through XII are grossly intact with no focal sensorimotor deficit. SKIN: Warm, dry, and intact without obvious rash, lesion, or ulcer. Thin skin with multiple superficial abrasions and skin tears.    Labs on Admission:  CBC:  Recent Labs Lab 01/19/17 1516  WBC 7.5  NEUTROABS 4.3  HGB 12.2*  HCT 36.1*  MCV 93.2  PLT 161   Basic Metabolic Panel:  Recent Labs Lab 01/19/17 1516  NA 131*  K 4.3  CL 97*  CO2 26  GLUCOSE 120*  BUN 22*  CREATININE 0.98  CALCIUM 8.7*   GFR: Estimated Creatinine Clearance: 30.8 mL/min (by C-G formula based on SCr of 0.98 mg/dL). Liver Function Tests:  Recent Labs Lab 01/19/17 1516  AST 25  ALT 19  ALKPHOS 40  BILITOT 0.6  PROT 5.8*  ALBUMIN 3.0*   No results for input(s): LIPASE, AMYLASE in the last 168 hours. No results for input(s): AMMONIA in the last 168 hours. Coagulation Profile: No results for input(s): INR, PROTIME in the last 168 hours. Cardiac Enzymes:  Recent Labs Lab 01/19/17 1516 01/19/17 1817  TROPONINI 0.03* 0.03*   BNP (last 3  results) No results for input(s): PROBNP in the last 8760 hours. HbA1C: No results for input(s): HGBA1C in the last 72 hours. CBG: No results for input(s): GLUCAP in the last 168 hours. Lipid Profile: No results for input(s): CHOL, HDL, LDLCALC, TRIG, CHOLHDL, LDLDIRECT in the last 72 hours. Thyroid Function Tests: No results for input(s): TSH, T4TOTAL, FREET4, T3FREE, THYROIDAB in the last 72 hours. Anemia Panel: No results for input(s): VITAMINB12, FOLATE, FERRITIN, TIBC, IRON, RETICCTPCT in the last 72 hours. Urine analysis:    Component Value Date/Time   COLORURINE YELLOW (A) 01/14/2017 0745   APPEARANCEUR CLEAR (A) 01/14/2017 0745   LABSPEC 1.009 01/14/2017 0745   PHURINE 7.0 01/14/2017 0745   GLUCOSEU NEGATIVE 01/14/2017 0745   HGBUR LARGE (A) 01/14/2017 0745   BILIRUBINUR NEGATIVE 01/14/2017 0745   KETONESUR NEGATIVE 01/14/2017 0745   PROTEINUR NEGATIVE 01/14/2017 0745   NITRITE NEGATIVE 01/14/2017 0745   LEUKOCYTESUR NEGATIVE 01/14/2017 0745   Sepsis Labs: @LABRCNTIP (procalcitonin:4,lacticidven:4) ) Recent Results (from the past 240 hour(s))  Urine Culture     Status: Abnormal   Collection Time: 01/14/17  7:45 AM  Result Value Ref Range Status   Specimen Description URINE, CATHETERIZED  Final   Special Requests NONE  Final   Culture 10,000 COLONIES/mL ENTEROCOCCUS FAECIUM (A)  Final   Report Status 01/16/2017 FINAL  Final   Organism ID, Bacteria ENTEROCOCCUS FAECIUM (A)  Final      Susceptibility   Enterococcus faecium - MIC*    AMPICILLIN >=32 RESISTANT Resistant     LEVOFLOXACIN >=8 RESISTANT Resistant     NITROFURANTOIN 256 RESISTANT Resistant     VANCOMYCIN <=0.5 SENSITIVE Sensitive     * 10,000 COLONIES/mL ENTEROCOCCUS FAECIUM     Radiological Exams on Admission: Ct Angio Chest Pe W And/or Wo Contrast  Result Date: 01/19/2017 CLINICAL DATA:  Acute onset of shortness of breath. Generalized abdominal pain. Initial encounter. EXAM: CT ANGIOGRAPHY CHEST CT  ABDOMEN AND PELVIS WITH CONTRAST TECHNIQUE: Multidetector CT imaging of the chest was performed using the standard protocol during bolus administration of intravenous contrast. Multiplanar CT image reconstructions and MIPs were obtained to evaluate the vascular anatomy. Multidetector CT imaging of the abdomen and pelvis was performed using the standard protocol during bolus administration of intravenous contrast. CONTRAST:  75 mL of Isovue 370 IV contrast COMPARISON:  Chest radiograph performed earlier today at 3:30 p.m., and CT of the abdomen and pelvis performed 08/29/2016 FINDINGS: CTA CHEST FINDINGS Cardiovascular:  There is no evidence of pulmonary embolus. Scattered calcification is noted along the thoracic aorta. Diffuse coronary artery calcifications are seen. The heart is normal in size. The  great vessels are grossly unremarkable in appearance. Mediastinum/Nodes: A mildly enlarged periaortic node is noted, measuring 1.4 cm in short axis. No additional mediastinal lymphadenopathy is seen. No pericardial effusion is identified. The thyroid gland is somewhat heterogeneous but otherwise unremarkable. No axillary lymphadenopathy is seen. Lungs/Pleura: Bilateral emphysema is noted. Bibasilar atelectasis is noted. Trace bilateral pleural fluid is noted. No pneumothorax is seen. No masses are identified. Scarring is noted at the lung apices. Musculoskeletal: No acute osseous abnormalities are identified. The visualized musculature is unremarkable in appearance. Review of the MIP images confirms the above findings. CT ABDOMEN and PELVIS FINDINGS Hepatobiliary: The liver is unremarkable in appearance. The patient is status post cholecystectomy, with clips noted at the gallbladder fossa. The common bile duct remains normal in caliber. Pancreas: The pancreas is within normal limits. Spleen: The spleen is unremarkable in appearance. Adrenals/Urinary Tract: The adrenal glands are unremarkable in appearance. Mild  bilateral renal atrophy is noted. Small bilateral renal cysts are seen. There is no evidence of hydronephrosis. No renal or ureteral stones are seen. No perinephric stranding is appreciated. Stomach/Bowel: The stomach is unremarkable in appearance. The small bowel is within normal limits. The appendix is not visualized; there is no evidence for appendicitis. Scattered diverticulosis is noted along the descending and sigmoid colon, without evidence of diverticulitis. Vascular/Lymphatic: Diffuse calcification is seen along the abdominal aorta and its branches. The abdominal aorta is otherwise grossly unremarkable. The inferior vena cava is grossly unremarkable. No retroperitoneal lymphadenopathy is seen. No pelvic sidewall lymphadenopathy is identified. Reproductive: The bladder is decompressed, with a Foley catheter in place and a small amount of air in the bladder. Several heterogeneous nodules are seen within the prostate gland, measuring up to 3.3 cm in size. Other: No additional soft tissue abnormalities are seen. Musculoskeletal: No acute osseous abnormalities are identified. There is chronic compression deformity of vertebral body L2. Pins are noted at the right femoral neck. There is fatty atrophy of musculature along the right lateral abdominal wall. Review of the MIP images confirms the above findings. IMPRESSION: 1. No evidence of pulmonary embolus. 2. Several heterogeneous nodules within the prostate gland, measuring up to 3.3 cm in size. Malignancy cannot be excluded. Would correlate with PSA, as deemed clinically appropriate. 3. Diffuse coronary artery calcifications noted. 4. Mildly enlarged 1.4 cm periaortic node incidentally noted. 5. Bilateral emphysema. Bibasilar atelectasis. Trace bilateral pleural fluid noted. Scarring at the lung apices. 6. Mild bilateral renal atrophy.  Small bilateral renal cysts. 7. Scattered diverticulosis along the descending and sigmoid colon, without evidence of  diverticulitis. 8. Diffuse aortic atherosclerosis. 9. Chronic compression deformity of vertebral body L2. Electronically Signed   By: Roanna Raider M.D.   On: 01/19/2017 22:10   Ct Abdomen Pelvis W Contrast  Result Date: 01/19/2017 CLINICAL DATA:  Acute onset of shortness of breath. Generalized abdominal pain. Initial encounter. EXAM: CT ANGIOGRAPHY CHEST CT ABDOMEN AND PELVIS WITH CONTRAST TECHNIQUE: Multidetector CT imaging of the chest was performed using the standard protocol during bolus administration of intravenous contrast. Multiplanar CT image reconstructions and MIPs were obtained to evaluate the vascular anatomy. Multidetector CT imaging of the abdomen and pelvis was performed using the standard protocol during bolus administration of intravenous contrast. CONTRAST:  75 mL of Isovue 370 IV contrast COMPARISON:  Chest radiograph performed earlier today at 3:30 p.m., and CT of the abdomen and pelvis performed 08/29/2016 FINDINGS: CTA CHEST FINDINGS Cardiovascular:  There is no evidence of pulmonary embolus. Scattered calcification is noted along the thoracic  aorta. Diffuse coronary artery calcifications are seen. The heart is normal in size. The great vessels are grossly unremarkable in appearance. Mediastinum/Nodes: A mildly enlarged periaortic node is noted, measuring 1.4 cm in short axis. No additional mediastinal lymphadenopathy is seen. No pericardial effusion is identified. The thyroid gland is somewhat heterogeneous but otherwise unremarkable. No axillary lymphadenopathy is seen. Lungs/Pleura: Bilateral emphysema is noted. Bibasilar atelectasis is noted. Trace bilateral pleural fluid is noted. No pneumothorax is seen. No masses are identified. Scarring is noted at the lung apices. Musculoskeletal: No acute osseous abnormalities are identified. The visualized musculature is unremarkable in appearance. Review of the MIP images confirms the above findings. CT ABDOMEN and PELVIS FINDINGS  Hepatobiliary: The liver is unremarkable in appearance. The patient is status post cholecystectomy, with clips noted at the gallbladder fossa. The common bile duct remains normal in caliber. Pancreas: The pancreas is within normal limits. Spleen: The spleen is unremarkable in appearance. Adrenals/Urinary Tract: The adrenal glands are unremarkable in appearance. Mild bilateral renal atrophy is noted. Small bilateral renal cysts are seen. There is no evidence of hydronephrosis. No renal or ureteral stones are seen. No perinephric stranding is appreciated. Stomach/Bowel: The stomach is unremarkable in appearance. The small bowel is within normal limits. The appendix is not visualized; there is no evidence for appendicitis. Scattered diverticulosis is noted along the descending and sigmoid colon, without evidence of diverticulitis. Vascular/Lymphatic: Diffuse calcification is seen along the abdominal aorta and its branches. The abdominal aorta is otherwise grossly unremarkable. The inferior vena cava is grossly unremarkable. No retroperitoneal lymphadenopathy is seen. No pelvic sidewall lymphadenopathy is identified. Reproductive: The bladder is decompressed, with a Foley catheter in place and a small amount of air in the bladder. Several heterogeneous nodules are seen within the prostate gland, measuring up to 3.3 cm in size. Other: No additional soft tissue abnormalities are seen. Musculoskeletal: No acute osseous abnormalities are identified. There is chronic compression deformity of vertebral body L2. Pins are noted at the right femoral neck. There is fatty atrophy of musculature along the right lateral abdominal wall. Review of the MIP images confirms the above findings. IMPRESSION: 1. No evidence of pulmonary embolus. 2. Several heterogeneous nodules within the prostate gland, measuring up to 3.3 cm in size. Malignancy cannot be excluded. Would correlate with PSA, as deemed clinically appropriate. 3. Diffuse  coronary artery calcifications noted. 4. Mildly enlarged 1.4 cm periaortic node incidentally noted. 5. Bilateral emphysema. Bibasilar atelectasis. Trace bilateral pleural fluid noted. Scarring at the lung apices. 6. Mild bilateral renal atrophy.  Small bilateral renal cysts. 7. Scattered diverticulosis along the descending and sigmoid colon, without evidence of diverticulitis. 8. Diffuse aortic atherosclerosis. 9. Chronic compression deformity of vertebral body L2. Electronically Signed   By: Roanna Raider M.D.   On: 01/19/2017 22:10   Dg Chest Portable 1 View  Result Date: 01/19/2017 CLINICAL DATA:  Shortness of breath. EXAM: PORTABLE CHEST 1 VIEW COMPARISON:  Chest x-ray dated January 02, 2017. FINDINGS: The cardiomediastinal silhouette is normal in size. Normal pulmonary vascularity. Atherosclerotic calcification of the aortic arch. Lungs remain hyperinflated with emphysematous changes in the bilateral upper lobes. Bibasilar atelectasis/scarring is unchanged. No definite pleural effusion. No pneumothorax. No acute osseous abnormality. IMPRESSION: COPD.  Bibasilar atelectasis/scarring, unchanged. Electronically Signed   By: Obie Dredge M.D.   On: 01/19/2017 16:00    Assessment/Plan  This is a 83 y.o. male with a history of COPD, CAD, hypertension, hyperlipidemia, PTSD, systolic congestive heart failure now being admitted with:  #.  Shortness of breath, acute on chronic without hypoxia. No evidence to suggest pneumonia or heart failure Admit observation O2 and Med-Neb therapy as needed Continue Spiriva Low dose steroid taper.  #. History of heart failure Continue Lasix  Admission status: Observation IV Fluids: Hep-Lock Diet/Nutrition: Heart healthy Consults called: None  DVT Px: Lovenox, SCDs and early ambulation. Code Status: Full Code  Disposition Plan: To home in 1-2 days  All the records are reviewed and case discussed with ED provider. Management plans discussed with the  patient and/or family who express understanding and agree with plan of care.  Jordyan Hardiman D.O. on 01/20/2017 at 12:43 AM Between 7am to 6pm - Pager - [phone] After 6pm go to [email or web address] - Social research officer, government [facility] [place] [facility] Office [phone] CC: Primary care physician; [name], MD   01/20/2017, 12:43 AM

## 2017-01-20 NOTE — Progress Notes (Signed)
Advanced Care Plan.  Purpose of Encounter: CODE STATUS Parties in Attendance: The patient and me. Patient's Decisional Capacity: Yes. Medical Story: This is a 83 y.o. male with a history of COPD, CAD, hypertension, hyperlipidemia, PTSD, systolic congestive heart failure. He is admitted for shortness of breath, acute on chronic without hypoxia. He is alert, awake and oriented. I discussed with him about CODE STATUS. He stated that he wants full code.  Goals of Care Determinations: Full code. Plan:  Code Status: Full code. Time spent discussing advance care planning: 17 minutes.

## 2017-01-20 NOTE — Progress Notes (Signed)
Patient admitted for SOB ordered VTE prophylaxis with lovenox 40 mg subq daily. CrCl 30.8 ml/min  Will readjust to lovenox 30 mg daily.  Thomasene Ripple, PharmD, BCPS Clinical Pharmacist 01/20/2017

## 2017-01-21 NOTE — Discharge Summary (Signed)
[employer] - [place] at [facility]   PATIENT NAME: Daniel Dickson    MR#:  [id number]  DATE OF BIRTH:  05-28-1912  DATE OF ADMISSION:  01/19/2017   ADMITTING PHYSICIAN: Tonye Royalty, DO  DATE OF DISCHARGE: 01/21/2017  1:16 PM  PRIMARY CARE PHYSICIAN: [name], MD   ADMISSION DIAGNOSIS:  COPD exacerbation (HCC) [J44.1] Dyspnea, unspecified type [R06.00] Shortness of breath [R06.02] DISCHARGE DIAGNOSIS:  Active Problems:   Shortness of breath  SECONDARY DIAGNOSIS:   Past Medical History:  Diagnosis Date  . Anxiety   . COPD (chronic obstructive pulmonary disease) (HCC)   . Coronary artery disease    a. 2012 s/p MI/cardiac arrest--> PCI mRCA.  Marland Kitchen Hypertension   . Hypertensive heart disease   . Mixed hyperlipidemia   . PTSD (post-traumatic stress disorder)    with headaches  . Systolic CHF [facility])    HOSPITAL COURSE:  This is a 83 y.o.malewith a history of COPD, CAD, hypertension, hyperlipidemia, PTSD, systolic congestive heart failure. He is admitted for shortness of breath.  #. Shortness of breath, acute on chronic without hypoxia.No evidence to suggest pneumonia or heart failure Try to wean off O2 and DuoNeb therapy as needed Continue Albuterol, Proair and Spiriva  #. History of heart failure. Stable. Continue Lasix  Physical therapy suggested home health and PT. DISCHARGE CONDITIONS:  The patient is discharged to home with home health and PT today. CONSULTS OBTAINED:   DRUG ALLERGIES:   Allergies  Allergen Reactions  . Iodine (Kelp) [Iodine] Nausea And Vomiting   DISCHARGE MEDICATIONS:   Allergies as of 01/21/2017      Reactions   Iodine (kelp) [iodine] Nausea And Vomiting      Medication List    STOP taking these medications   benzonatate 100 MG capsule Commonly known as:  TESSALON   doxycycline 100 MG tablet Commonly known as:  VIBRA-TABS     TAKE these medications   acetaminophen 500 MG tablet Commonly  known as:  TYLENOL Take 500 mg by mouth daily as needed for mild pain or moderate pain.   feeding supplement Liqd Take 1 Container by mouth 3 (three) times daily between meals.   furosemide 40 MG tablet Commonly known as:  LASIX Take 0.5 tablets (20 mg total) by mouth daily.   magnesium hydroxide 400 MG/5ML suspension Commonly known as:  MILK OF MAGNESIA Take 30 mLs by mouth 2 (two) times daily as needed for mild constipation or moderate constipation.   Melatonin 5 MG Caps Take 10 mg by mouth at bedtime.   nitroGLYCERIN 0.4 MG SL tablet Commonly known as:  NITROSTAT Place 0.4 mg under the tongue every 5 (five) minutes as needed for chest pain. Reported on 10/19/2015   oxybutynin 5 MG 24 hr tablet Commonly known as:  DITROPAN-XL Take 5 mg by mouth daily as needed.   PROAIR HFA 108 (90 Base) MCG/ACT inhaler Generic drug:  albuterol Inhale 2 puffs into the lungs every 6 (six) hours as needed. For wheezing.   albuterol (2.5 MG/3ML) 0.083% nebulizer solution Commonly known as:  PROVENTIL Take 3 mLs (2.5 mg total) by nebulization every 4 (four) hours as needed for wheezing or shortness of breath.   tiotropium 18 MCG inhalation capsule Commonly known as:  SPIRIVA Place 1 capsule (18 mcg total) into inhaler and inhale every evening.        DISCHARGE INSTRUCTIONS:  See AVS.  If you experience worsening of your admission symptoms, develop shortness of breath,  life threatening emergency, suicidal or homicidal thoughts you must seek medical attention immediately by calling 911 or calling your MD immediately  if symptoms less severe.  You Must read complete instructions/literature along with all the possible adverse reactions/side effects for all the Medicines you take and that have been prescribed to you. Take any new Medicines after you have completely understood and accpet all the possible adverse reactions/side effects.   Please note  You were cared for by a hospitalist during  your hospital stay. If you have any questions about your discharge medications or the care you received while you were in the hospital after you are discharged, you can call the unit and asked to speak with the hospitalist on call if the hospitalist that took care of you is not available. Once you are discharged, your primary care physician will handle any further medical issues. Please note that NO REFILLS for any discharge medications will be authorized once you are discharged, as it is imperative that you return to your primary care physician (or establish a relationship with a primary care physician if you do not have one) for your aftercare needs so that they can reassess your need for medications and monitor your lab values.    On the day of Discharge:  VITAL SIGNS:  Blood pressure (!) 142/76, pulse 100, temperature 98.1 F (36.7 C), temperature source Oral, resp. rate 18, height  (1.676 m), weight 124 lb 3.2 oz (56.3 kg), SpO2 96 %. PHYSICAL EXAMINATION:  GENERAL:  83 y.o.-year-old patient lying in the bed with no acute distress.  EYES: Pupils equal, round, reactive to light and accommodation. No scleral icterus. Extraocular muscles intact.  HEENT: Head atraumatic, normocephalic. Oropharynx and nasopharynx clear.  NECK:  Supple, no jugular venous distention. No thyroid enlargement, no tenderness.  LUNGS: Normal breath sounds bilaterally, no wheezing, rales,rhonchi or crepitation. No use of accessory muscles of respiration.  CARDIOVASCULAR: S1, S2 normal. No murmurs, rubs, or gallops.  ABDOMEN: Soft, non-tender, non-distended. Bowel sounds present. No organomegaly or mass.  EXTREMITIES: No pedal edema, cyanosis, or clubbing.  NEUROLOGIC: Cranial nerves II through XII are intact. Muscle strength 5/5 in all extremities. Sensation intact. Gait not checked.  PSYCHIATRIC: The patient is alert and oriented x 3.  SKIN: No obvious rash, lesion, or ulcer.  DATA REVIEW:   CBC  Recent  Labs Lab 01/20/17 0452  WBC 8.3  HGB 13.3  HCT 38.6*  PLT 157    Chemistries   Recent Labs Lab 01/19/17 1516 01/20/17 0452  NA 131* 135  K 4.3 4.0  CL 97* 100*  CO2 26 28  GLUCOSE 120* 94  BUN 22* 15  CREATININE 0.98 0.86  CALCIUM 8.7* 8.9  AST 25  --   ALT 19  --   ALKPHOS 40  --   BILITOT 0.6  --      Microbiology Results  Results for orders placed or performed during the hospital encounter of 01/14/17  Urine Culture     Status: Abnormal   Collection Time: 01/14/17  7:45 AM  Result Value Ref Range Status   Specimen Description URINE, CATHETERIZED  Final   Special Requests NONE  Final   Culture 10,000 COLONIES/mL ENTEROCOCCUS FAECIUM (A)  Final   Report Status 01/16/2017 FINAL  Final   Organism ID, Bacteria ENTEROCOCCUS FAECIUM (A)  Final      Susceptibility   Enterococcus faecium - MIC*    AMPICILLIN >=32 RESISTANT Resistant     LEVOFLOXACIN >=8 RESISTANT  Resistant     NITROFURANTOIN 256 RESISTANT Resistant     VANCOMYCIN <=0.5 SENSITIVE Sensitive     * 10,000 COLONIES/mL ENTEROCOCCUS FAECIUM    RADIOLOGY:  No results found.   Management plans discussed with the patient, family and they are in agreement.  CODE STATUS: Prior   TOTAL TIME TAKING CARE OF THIS PATIENT: 23 minutes.    Daniel Dickson M.D on 01/21/2017 at 5:00 PM  Between 7am to 6pm - Pager - [phone]  After 6pm go to [email or web address] - Social research officer, government  [facility] [place] [facility]  Office  [phone]  CC: Primary care physician; [name], MD   Note: This dictation was prepared with Dragon dictation along with smaller phrase technology. Any transcriptional errors that result from this process are unintentional.

## 2017-01-21 NOTE — Care Management Note (Signed)
Case Management Note  Patient Details  Name: Daniel Dickson MRN: [id number] Date of Birth: [birth year] .  Subjective/Objective:    Call to [facility] at [facility] to resume Home Health services . Discussed with Jerilynn Som at [facility] about frequent ED visits and admissions. Requested that [facility] explore with patient if he is fearful and anxious about his disease process and is having panic attacks at home.                 Action/Plan:   Expected Discharge Date:  01/21/17               Expected Discharge Plan:  Home w Home Health Services  In-House Referral:  NA  Discharge planning Services  CM Consult  Post Acute Care Choice:  NA Choice offered to:  Patient  DME Arranged:  N/A DME Agency:  NA  HH Arranged:  RN, PT, OT, Nurse's Aide, Speech Therapy HH Agency:  [facility] (Already open to [facility] )  Status of Service:  Completed, signed off  If discussed at Long Length of Stay Meetings, dates discussed:    Additional Comments:  Guage Efferson A, RN 01/21/2017, 10:40 AM

## 2017-01-21 NOTE — Progress Notes (Signed)
Discharge instructions given to patient and family, verbalized understanding.

## 2017-01-21 NOTE — Discharge Instructions (Signed)
Fall precaution. °HHPT °

## 2017-03-04 ENCOUNTER — Other Ambulatory Visit: Payer: Self-pay

## 2017-03-04 ENCOUNTER — Observation Stay
Admission: EM | Admit: 2017-03-04 | Discharge: 2017-03-05 | Disposition: A | Discharge status: Home Health | Payer: Medicare PPO | Attending: Internal Medicine | Admitting: Internal Medicine

## 2017-03-04 DIAGNOSIS — I110 Hypertensive heart disease with heart failure: Secondary | ICD-10-CM | POA: Insufficient documentation

## 2017-03-04 DIAGNOSIS — R310 Gross hematuria: Secondary | ICD-10-CM | POA: Insufficient documentation

## 2017-03-04 DIAGNOSIS — K224 Dyskinesia of esophagus: Secondary | ICD-10-CM | POA: Diagnosis not present

## 2017-03-04 DIAGNOSIS — N401 Enlarged prostate with lower urinary tract symptoms: Secondary | ICD-10-CM | POA: Diagnosis not present

## 2017-03-04 DIAGNOSIS — I252 Old myocardial infarction: Secondary | ICD-10-CM | POA: Insufficient documentation

## 2017-03-04 DIAGNOSIS — E43 Unspecified severe protein-calorie malnutrition: Secondary | ICD-10-CM | POA: Insufficient documentation

## 2017-03-04 DIAGNOSIS — I5022 Chronic systolic (congestive) heart failure: Secondary | ICD-10-CM | POA: Diagnosis not present

## 2017-03-04 DIAGNOSIS — K5900 Constipation, unspecified: Secondary | ICD-10-CM | POA: Diagnosis not present

## 2017-03-04 DIAGNOSIS — K922 Gastrointestinal hemorrhage, unspecified: Principal | ICD-10-CM | POA: Diagnosis present

## 2017-03-04 DIAGNOSIS — R339 Retention of urine, unspecified: Secondary | ICD-10-CM | POA: Insufficient documentation

## 2017-03-04 DIAGNOSIS — E875 Hyperkalemia: Secondary | ICD-10-CM | POA: Insufficient documentation

## 2017-03-04 DIAGNOSIS — K921 Melena: Secondary | ICD-10-CM

## 2017-03-04 DIAGNOSIS — E871 Hypo-osmolality and hyponatremia: Secondary | ICD-10-CM | POA: Diagnosis not present

## 2017-03-04 DIAGNOSIS — R338 Other retention of urine: Secondary | ICD-10-CM | POA: Diagnosis not present

## 2017-03-04 DIAGNOSIS — K625 Hemorrhage of anus and rectum: Secondary | ICD-10-CM

## 2017-03-04 DIAGNOSIS — F4310 Post-traumatic stress disorder, unspecified: Secondary | ICD-10-CM | POA: Diagnosis not present

## 2017-03-04 DIAGNOSIS — I255 Ischemic cardiomyopathy: Secondary | ICD-10-CM | POA: Insufficient documentation

## 2017-03-04 DIAGNOSIS — R627 Adult failure to thrive: Secondary | ICD-10-CM | POA: Diagnosis not present

## 2017-03-04 DIAGNOSIS — D649 Anemia, unspecified: Secondary | ICD-10-CM | POA: Diagnosis not present

## 2017-03-04 DIAGNOSIS — L03115 Cellulitis of right lower limb: Secondary | ICD-10-CM | POA: Insufficient documentation

## 2017-03-04 DIAGNOSIS — Z9049 Acquired absence of other specified parts of digestive tract: Secondary | ICD-10-CM | POA: Insufficient documentation

## 2017-03-04 DIAGNOSIS — J449 Chronic obstructive pulmonary disease, unspecified: Secondary | ICD-10-CM | POA: Diagnosis not present

## 2017-03-04 DIAGNOSIS — Z9981 Dependence on supplemental oxygen: Secondary | ICD-10-CM | POA: Insufficient documentation

## 2017-03-04 DIAGNOSIS — I2510 Atherosclerotic heart disease of native coronary artery without angina pectoris: Secondary | ICD-10-CM | POA: Diagnosis not present

## 2017-03-04 DIAGNOSIS — F419 Anxiety disorder, unspecified: Secondary | ICD-10-CM | POA: Diagnosis not present

## 2017-03-04 DIAGNOSIS — N179 Acute kidney failure, unspecified: Secondary | ICD-10-CM | POA: Insufficient documentation

## 2017-03-04 DIAGNOSIS — Z79899 Other long term (current) drug therapy: Secondary | ICD-10-CM | POA: Insufficient documentation

## 2017-03-04 DIAGNOSIS — J9611 Chronic respiratory failure with hypoxia: Secondary | ICD-10-CM | POA: Insufficient documentation

## 2017-03-04 DIAGNOSIS — E782 Mixed hyperlipidemia: Secondary | ICD-10-CM | POA: Insufficient documentation

## 2017-03-04 DIAGNOSIS — Z87891 Personal history of nicotine dependence: Secondary | ICD-10-CM | POA: Insufficient documentation

## 2017-03-04 DIAGNOSIS — Z825 Family history of asthma and other chronic lower respiratory diseases: Secondary | ICD-10-CM | POA: Insufficient documentation

## 2017-03-04 DIAGNOSIS — Z9849 Cataract extraction status, unspecified eye: Secondary | ICD-10-CM | POA: Insufficient documentation

## 2017-03-04 DIAGNOSIS — Z91041 Radiographic dye allergy status: Secondary | ICD-10-CM | POA: Insufficient documentation

## 2017-03-04 LAB — CBC WITH DIFFERENTIAL/PLATELET
BASOS ABS: 0 10*3/uL (ref 0–0.1)
Basophils Relative: 0 %
EOS PCT: 0 %
Eosinophils Absolute: 0 10*3/uL (ref 0–0.7)
HEMATOCRIT: 36.5 % — AB (ref 40.0–52.0)
Hemoglobin: 12.4 g/dL — ABNORMAL LOW (ref 13.0–18.0)
LYMPHS PCT: 11 %
Lymphs Abs: 0.7 10*3/uL — ABNORMAL LOW (ref 1.0–3.6)
MCH: 32.6 pg (ref 26.0–34.0)
MCHC: 33.9 g/dL (ref 32.0–36.0)
MCV: 96.1 fL (ref 80.0–100.0)
MONO ABS: 0.4 10*3/uL (ref 0.2–1.0)
MONOS PCT: 6 %
NEUTROS ABS: 5.6 10*3/uL (ref 1.4–6.5)
Neutrophils Relative %: 83 %
PLATELETS: 195 10*3/uL (ref 150–440)
RBC: 3.8 MIL/uL — ABNORMAL LOW (ref 4.40–5.90)
RDW: 15.4 % — AB (ref 11.5–14.5)
WBC: 6.8 10*3/uL (ref 3.8–10.6)

## 2017-03-04 LAB — FERRITIN: FERRITIN: 302 ng/mL (ref 24–336)

## 2017-03-04 LAB — COMPREHENSIVE METABOLIC PANEL
ALT: 20 U/L (ref 17–63)
AST: 27 U/L (ref 15–41)
Albumin: 3.1 g/dL — ABNORMAL LOW (ref 3.5–5.0)
Alkaline Phosphatase: 41 U/L (ref 38–126)
Anion gap: 7 (ref 5–15)
BILIRUBIN TOTAL: 0.9 mg/dL (ref 0.3–1.2)
BUN: 31 mg/dL — ABNORMAL HIGH (ref 6–20)
CHLORIDE: 101 mmol/L (ref 101–111)
CO2: 26 mmol/L (ref 22–32)
Calcium: 9.2 mg/dL (ref 8.9–10.3)
Creatinine, Ser: 0.93 mg/dL (ref 0.61–1.24)
Glucose, Bld: 110 mg/dL — ABNORMAL HIGH (ref 65–99)
POTASSIUM: 4.2 mmol/L (ref 3.5–5.1)
Sodium: 134 mmol/L — ABNORMAL LOW (ref 135–145)
Total Protein: 6.3 g/dL — ABNORMAL LOW (ref 6.5–8.1)

## 2017-03-04 LAB — TYPE AND SCREEN
ABO/RH(D): A POS
Antibody Screen: NEGATIVE

## 2017-03-04 LAB — HEMOGLOBIN
HEMOGLOBIN: 12.2 g/dL — AB (ref 13.0–18.0)
Hemoglobin: 12.8 g/dL — ABNORMAL LOW (ref 13.0–18.0)

## 2017-03-04 LAB — VITAMIN B12: VITAMIN B 12: 636 pg/mL (ref 180–914)

## 2017-03-04 LAB — FOLATE: FOLATE: 22 ng/mL (ref >5.9)

## 2017-03-04 MED ORDER — ONDANSETRON HCL 4 MG PO TABS: 4.0000 mg | ORAL_TABLET | Freq: Four times a day (QID) | ORAL | Status: DC | PRN

## 2017-03-04 MED ORDER — FUROSEMIDE 20 MG PO TABS
10.0000 mg | ORAL_TABLET | Freq: Every day | ORAL | Status: DC
  Administered 2017-03-05: 10 mg via ORAL
  Filled 2017-03-04: qty 1

## 2017-03-04 MED ORDER — PANTOPRAZOLE SODIUM 40 MG IV SOLR
40.0000 mg | Freq: Two times a day (BID) | INTRAVENOUS | Status: DC
  Administered 2017-03-04: 40 mg via INTRAVENOUS
  Filled 2017-03-04: qty 40

## 2017-03-04 MED ORDER — IPRATROPIUM-ALBUTEROL 0.5-2.5 (3) MG/3ML IN SOLN
3.0000 mL | Freq: Four times a day (QID) | RESPIRATORY_TRACT | Status: DC | PRN
Start: 2017-03-04 — End: 2017-03-05

## 2017-03-04 MED ORDER — BELLADONNA ALKALOIDS-OPIUM 16.2-60 MG RE SUPP
1.0000 | Freq: Three times a day (TID) | RECTAL | Status: DC | PRN
  Filled 2017-03-04: qty 1

## 2017-03-04 MED ORDER — SENNOSIDES-DOCUSATE SODIUM 8.6-50 MG PO TABS
1.0000 | ORAL_TABLET | Freq: Every evening | ORAL | Status: DC | PRN
  Administered 2017-03-04: 1 via ORAL
  Filled 2017-03-04: qty 1

## 2017-03-04 MED ORDER — ACETAMINOPHEN 325 MG PO TABS
650.0000 mg | ORAL_TABLET | Freq: Four times a day (QID) | ORAL | Status: DC | PRN
Start: 2017-03-04 — End: 2017-03-05

## 2017-03-04 MED ORDER — ONDANSETRON HCL 4 MG/2ML IJ SOLN: 4.0000 mg | Freq: Four times a day (QID) | INTRAMUSCULAR | Status: DC | PRN

## 2017-03-04 MED ORDER — PSYLLIUM 95 % PO PACK
1.0000 | PACK | Freq: Every day | ORAL | Status: DC
  Administered 2017-03-04 – 2017-03-05 (×2): 1 via ORAL
  Filled 2017-03-04 (×2): qty 1

## 2017-03-04 MED ORDER — POLYETHYLENE GLYCOL 3350 17 G PO PACK
17.0000 g | PACK | Freq: Every day | ORAL | Status: DC
  Administered 2017-03-04 – 2017-03-05 (×2): 17 g via ORAL
  Filled 2017-03-04 (×2): qty 1

## 2017-03-04 MED ORDER — ACETAMINOPHEN 650 MG RE SUPP: 650.0000 mg | Freq: Four times a day (QID) | RECTAL | Status: DC | PRN

## 2017-03-04 MED ORDER — HYDROCODONE-ACETAMINOPHEN 5-325 MG PO TABS
1.0000 | ORAL_TABLET | ORAL | Status: DC | PRN
  Administered 2017-03-04: 1 via ORAL
  Administered 2017-03-04: 2 via ORAL
  Administered 2017-03-04: 1 via ORAL
  Administered 2017-03-05: 2 via ORAL
  Filled 2017-03-04: qty 1
  Filled 2017-03-04 (×2): qty 2
  Filled 2017-03-04: qty 1

## 2017-03-04 MED ORDER — METOPROLOL TARTRATE 25 MG PO TABS
25.0000 mg | ORAL_TABLET | Freq: Two times a day (BID) | ORAL | Status: DC
  Administered 2017-03-04 – 2017-03-05 (×2): 25 mg via ORAL
  Filled 2017-03-04 (×2): qty 1

## 2017-03-04 NOTE — ED Notes (Signed)
siderails up x2, bed in low locked position, yellow fall risk band on pt's left wrist.

## 2017-03-04 NOTE — H&P (Signed)
[facility] - [place] at [facility]   PATIENT NAME: Daniel BussingGeorge Dickson    MR#:  [id number]  DATE OF BIRTH:  02/11/1913  DATE OF ADMISSION:  03/04/2017  PRIMARY CARE PHYSICIAN: Dortha KernBliss, Laura K, MD   REQUESTING/REFERRING PHYSICIAN: dr Alvera Novelpadochowski  CHIEF COMPLAINT:   Rectal bleed HISTORY OF PRESENT ILLNESS:  Daniel Dickson  is a 83 y.o. male with a known history of COPD, CAD and urinary retention with chronic Foley catheter who presents with rectal bleeding. Patient reports over the past day he has had several episodes of bright red blood per rectum. He had 2 episodes while in the emergency room. He has history of hemorrhoidectomy. He denies abdominal pain, nausea or vomiting. He has noted dark colored stools over the past few days. He denies taking daily NSAIDs. He has a chronic Foley catheter which is changed every 30 days is also complaining of bladder spasm.  PAST MEDICAL HISTORY:   Past Medical History:  Diagnosis Date  . Anxiety   . COPD (chronic obstructive pulmonary disease) (HCC)   . Coronary artery disease    a. 2012 s/p MI/cardiac arrest--> PCI mRCA.  Marland Kitchen. Hypertension   . Hypertensive heart disease   . Mixed hyperlipidemia   . PTSD (post-traumatic stress disorder)    with headaches  . Systolic CHF (HCC)     PAST SURGICAL HISTORY:   Past Surgical History:  Procedure Laterality Date  . CATARACT EXTRACTION    . CHOLECYSTECTOMY    . CORONARY ANGIOPLASTY     with stent; [facility]  . HAND SURGERY    . HEMORRHOID SURGERY    . HIP SURGERY    . VASCULAR SURGERY     stent R femoral artery    SOCIAL HISTORY:   Social History   Tobacco Use  . Smoking status: Former Smoker    Packs/day: 3.00    Years: 40.00    Pack years: 120.00    Types: Cigarettes  . Smokeless tobacco: Never Used  . Tobacco comment: quit 62 years  Substance Use Topics  . Alcohol use: Yes    Alcohol/week: 1.8 oz    Types: 3 Glasses of wine per week    Comment: glass of wine at night     FAMILY HISTORY:   Family History  Problem Relation Age of Onset  . COPD Father   . Bladder Cancer Neg Hx   . Kidney cancer Neg Hx   . Prostate cancer Neg Hx     DRUG ALLERGIES:   Allergies  Allergen Reactions  . Iodine (Kelp) [Iodine] Nausea And Vomiting    REVIEW OF SYSTEMS:   Review of Systems  Constitutional: Negative.  Negative for chills, fever and malaise/fatigue.  HENT: Negative.  Negative for ear discharge, ear pain, hearing loss, nosebleeds and sore throat.   Eyes: Negative.  Negative for blurred vision and pain.  Respiratory: Negative.  Negative for cough, hemoptysis, shortness of breath and wheezing.   Cardiovascular: Negative.  Negative for chest pain, palpitations and leg swelling.  Gastrointestinal: Positive for blood in stool and melena. Negative for abdominal pain, diarrhea, nausea and vomiting.  Genitourinary: Negative.  Negative for dysuria.       Bladder spasm  Musculoskeletal: Negative.  Negative for back pain.  Skin: Negative.   Neurological: Negative for dizziness, tremors, speech change, focal weakness, seizures and headaches.  Endo/Heme/Allergies: Negative.  Does not bruise/bleed easily.  Psychiatric/Behavioral: Negative.  Negative for depression, hallucinations and suicidal ideas.    MEDICATIONS AT HOME:  Prior to Admission medications   Medication Sig Start Date End Date Taking? Authorizing Provider  acetaminophen (TYLENOL) 500 MG tablet Take 500 mg by mouth daily as needed for mild pain or moderate pain.    Yes [provider]  albuterol (PROAIR HFA) 108 (90 BASE) MCG/ACT inhaler Inhale 2 puffs into the lungs every 6 (six) hours as needed. For wheezing.   Yes [provider]  albuterol (PROVENTIL) (2.5 MG/3ML) 0.083% nebulizer solution Take 3 mLs (2.5 mg total) by nebulization every 4 (four) hours as needed for wheezing or shortness of breath. 09/28/16  Yes Sudini, Wardell Heath, MD  furosemide (LASIX) 40 MG tablet Take 0.5 tablets  (20 mg total) by mouth daily. 01/04/17  Yes Sainani, Rolly Pancake, MD  magnesium hydroxide (MILK OF MAGNESIA) 400 MG/5ML suspension Take 30 mLs by mouth 2 (two) times daily as needed for mild constipation or moderate constipation. 11/02/15  Yes Katharina Caper, MD  Melatonin 5 MG CAPS Take 10 mg by mouth at bedtime.   Yes [provider]  nitroGLYCERIN (NITROSTAT) 0.4 MG SL tablet Place 0.4 mg under the tongue every 5 (five) minutes as needed for chest pain. Reported on 10/19/2015   Yes [provider]  oxybutynin (DITROPAN-XL) 5 MG 24 hr tablet Take 5 mg by mouth daily as needed.   Yes [provider]  tiotropium (SPIRIVA) 18 MCG inhalation capsule Place 1 capsule (18 mcg total) into inhaler and inhale every evening. 10/09/15 01/14/18 Yes Derriona Branscom, Patricia Pesa, MD  feeding supplement (BOOST / RESOURCE BREEZE) LIQD Take 1 Container by mouth 3 (three) times daily between meals. 11/02/15   Katharina Caper, MD      VITAL SIGNS:  Blood pressure (!) 161/87, pulse 83, temperature 97.6 F (36.4 C), temperature source Oral, resp. rate 18, height 5\' 6"  (1.676 m), weight 56.2 kg (124 lb), SpO2 100 %.  PHYSICAL EXAMINATION:   Physical Exam  Constitutional: He is oriented to person, place, and time and well-developed, well-nourished, and in no distress. No distress.  HENT:  Head: Normocephalic.  Eyes: No scleral icterus.  Neck: Normal range of motion. Neck supple. No JVD present. No tracheal deviation present.  Cardiovascular: Normal rate, regular rhythm and normal heart sounds. Exam reveals no gallop and no friction rub.  No murmur heard. Pulmonary/Chest: Effort normal and breath sounds normal. No respiratory distress. He has no wheezes. He has no rales. He exhibits no tenderness.  Abdominal: Soft. Bowel sounds are normal. He exhibits no distension and no mass. There is no tenderness. There is no rebound and no guarding.  Musculoskeletal: Normal range of motion. He exhibits no edema.   Neurological: He is alert and oriented to person, place, and time.  Skin: Skin is warm. No rash noted. No erythema.  Psychiatric: Affect and judgment normal.      LABORATORY PANEL:   CBC Recent Labs  Lab 03/04/17 0639  WBC 6.8  HGB 12.4*  HCT 36.5*  PLT 195   ------------------------------------------------------------------------------------------------------------------  Chemistries  Recent Labs  Lab 03/04/17 0639  NA 134*  K 4.2  CL 101  CO2 26  GLUCOSE 110*  BUN 31*  CREATININE 0.93  CALCIUM 9.2  AST 27  ALT 20  ALKPHOS 41  BILITOT 0.9   ------------------------------------------------------------------------------------------------------------------  Cardiac Enzymes No results for input(s): TROPONINI in the last 168 hours. ------------------------------------------------------------------------------------------------------------------  RADIOLOGY:  No results found.  EKG:  None  IMPRESSION AND PLAN:   31 -year-old male with history of COPD and chronic indwelling Foley catheter due  to urinary retention who presents with rectal bleeding.   1. Rectal bleeding: Patient is guaiac positive Follow hemoglobin every 6 hours. GI consultation requested. ER physician spoke with GI consultant.  2. COPD without signs of exacerbation: Continue when necessary nebulizer treatments  3. Urinary retension of the chronic Foley and bladder spasm: Ordered belladonna suppositories when necessary Continue oxybutynin  4. Chronic systolic heart failure ejection fraction 40-45% without signs of exacerbation I will add low dose metoprolol.   All the records are reviewed and case discussed with ED provider. Management plans discussed with the patient and he is in agreement  CODE STATUS: full  TOTAL TIME TAKING CARE OF THIS PATIENT: 45 minutes.    Grisell Bissette M.D on 03/04/2017 at 10:33 AM  Between 7am to 6pm - Pager - [phone]  After 6pm go to [email or web address]  - password [email or web address]  [facility] [place] [facility]  Office  [phone]  CC: Primary care physician; Dortha KernBliss, Laura K, MD

## 2017-03-04 NOTE — Progress Notes (Signed)
Physical Therapy Evaluation Patient Details Name: Daniel Dickson MRN: [id number] DOB: 08/06/1912 Today's Date: 03/04/2017   History of Present Illness  Patient is a [age] y.o. male admitted on 24 NOV with rectal bleed. PMH includes COPD on 3L O2, CAD, urinary retention wht chronic foley catheter.  Clinical Impression  Patient is a pleasant male admitted for above listed reasons. At time of evaluation, patient demonstrates modified independence with bed mobility, transfers, and gait utilizing 3L O2. He demonstrated increased SOB with no objective change in oxygen saturations/HR. Patient's ambulatory distance was primarily limited by complaints of rectal cramping/spasm. Because patient is modified independent within household at baseline, it is believed that he will continue to benefit from skilled PT to improve cardiopulmonary endurance and functional mobility during hospital stay and at home upon d/c when medically ready.    Follow Up Recommendations Home health PT    Equipment Recommendations  None recommended by PT    Recommendations for Other Services       Precautions / Restrictions Precautions Precautions: Fall Restrictions Weight Bearing Restrictions: No      Mobility  Bed Mobility Overal bed mobility: Modified Independent             General bed mobility comments: Patient moves from supine to sit and sit to supine with HOB elevated and use of bed rails.  Transfers Overall transfer level: Modified independent Equipment used: Rolling walker (2 wheeled)             General transfer comment: Patient moves from sit to stand and stand to sit with use of RW.  Ambulation/Gait Ambulation/Gait assistance: Min guard Ambulation Distance (Feet): 15 Feet Assistive device: Rolling walker (2 wheeled)       General Gait Details: Patient ambulates with RW, requiring verbal cues to keep RW closer to body. Complained of rectal cramping during ambulation.  Stairs             Wheelchair Mobility    Modified Rankin (Stroke Patients Only)       Balance Overall balance assessment: Modified Independent;History of Falls                                           Pertinent Vitals/Pain Pain Assessment: No/denies pain    Home Living Family/patient expects to be discharged to:: Private residence Living Arrangements: Children Available Help at Discharge: Family;Personal care attendant;Available PRN/intermittently Type of Home: House Home Access: Ramped entrance     Home Layout: One level Home Equipment: Walker - 2 wheels;Walker - 4 wheels;Wheelchair - manual;Bedside commode;Grab bars - toilet;Grab bars - tub/shower      Prior Function Level of Independence: Needs assistance   Gait / Transfers Assistance Needed: ModI with RW in household.  ADL's / Homemaking Assistance Needed: HH aide 2x/week; daughter lives close and son who is handicapped lives in house        Hand Dominance        Extremity/Trunk Assessment   Upper Extremity Assessment Upper Extremity Assessment: Generalized weakness    Lower Extremity Assessment Lower Extremity Assessment: Generalized weakness       Communication   Communication: HOH  Cognition Arousal/Alertness: Awake/alert Behavior During Therapy: WFL for tasks assessed/performed Overall Cognitive Status: Within Functional Limits for tasks assessed  General Comments      Exercises     Assessment/Plan    PT Assessment Patient needs continued PT services  PT Problem List Decreased strength;Decreased activity tolerance;Decreased balance;Decreased mobility;Decreased knowledge of use of DME;Decreased safety awareness;Cardiopulmonary status limiting activity       PT Treatment Interventions DME instruction;Gait training;Functional mobility training;Therapeutic activities;Therapeutic exercise;Balance training;Patient/family  education    PT Goals (Current goals can be found in the Care Plan section)  Acute Rehab PT Goals Patient Stated Goal: To go home and stop burning during urination PT Goal Formulation: With patient Time For Goal Achievement: 03/18/17 Potential to Achieve Goals: Good    Frequency Min 2X/week   Barriers to discharge        Co-evaluation               AM-PAC PT "6 Clicks" Daily Activity  Outcome Measure Difficulty turning over in bed (including adjusting bedclothes, sheets and blankets)?: A Little Difficulty moving from lying on back to sitting on the side of the bed? : A Little Difficulty sitting down on and standing up from a chair with arms (e.g., wheelchair, bedside commode, etc,.)?: A Little Help needed moving to and from a bed to chair (including a wheelchair)?: A Little Help needed walking in hospital room?: A Little Help needed climbing 3-5 steps with a railing? : A Lot 6 Click Score: 17    End of Session Equipment Utilized During Treatment: Gait belt;Oxygen Activity Tolerance: Patient tolerated treatment well Patient left: in bed;with call bell/phone within reach;with bed alarm set Nurse Communication: Mobility status PT Visit Diagnosis: Unsteadiness on feet (R26.81);History of falling (Z91.81);Muscle weakness (generalized) (M62.81)    Time: [id number] PT Time Calculation (min) (ACUTE ONLY): 28 min   Charges:   PT Evaluation $PT Eval Low Complexity: 1 Low     PT G Codes:   PT G-Codes **NOT FOR INPATIENT CLASS** Functional Assessment Tool Used: AM-PAC 6 Clicks Basic Mobility;Clinical judgement Functional Limitation: Mobility: Walking and moving around Mobility: Walking and Moving Around Current Status ([id number](G8978): At least 40 percent but less than 60 percent impaired, limited or restricted Mobility: Walking and Moving Around Goal Status [phone](G8979): At least 20 percent but less than 40 percent impaired, limited or restricted      Neita CarpJulie Ann Mills Mitton, PT, DPT,  COMT 03/04/2017, 1:02 PM

## 2017-03-04 NOTE — Consult Note (Signed)
Daniel Darby, MD [address]  [place], [place] [address]  Main: [phone]  Fax: [phone] Pager: [phone]   Consultation  Referring Provider:     No ref. provider found Primary Care Physician:  Daniel Jude, MD Primary Gastroenterologist: None       Reason for Consultation:  Rectal bleeding  Date of Admission:  03/04/2017 Date of Consultation:  03/04/2017         HPI:   Daniel Dickson is a 83 y.o. male with history of ischemic cardiomyopathy, EF of 40-45%, COPD on home O2 3 L, chronic indwelling Foley catheter, hypertension presents with rectal bleeding this morning. He reports generally feeling constipated, takes stool softeners as needed. He generally has bowel movements every 2 or 3 days. He noticed blood mixed with stool this morning and presented to ER. He also complains of rectal pain. And also pain in his penis for a long time. His hemoglobin in the ER was 12.4, which is closer to his baseline. Repeat hemoglobin on the floor is stable. He had one bowl of chicken broth this morning already. He denies abdominal pain, nausea, vomiting.  NSAIDs: None  Antiplts/Anticoagulants/Anti thrombotics: None  GI Procedures: Does not recall having EGD or colonoscopy in the past He had cholecystectomy and hemorrhoidectomy several years ago He denies smoking tobacco Does not drink alcohol He lives by himself at home  Past Medical History:  Diagnosis Date  . Anxiety   . COPD (chronic obstructive pulmonary disease) ([place])   . Coronary artery disease    a. 2012 s/p MI/cardiac arrest--> PCI mRCA.  Marland Kitchen Hypertension   . Hypertensive heart disease   . Mixed hyperlipidemia   . PTSD (post-traumatic stress disorder)    with headaches  . Systolic CHF [facility])     Past Surgical History:  Procedure Laterality Date  . CATARACT EXTRACTION    . CHOLECYSTECTOMY    . CORONARY ANGIOPLASTY     with stent; [facility]  . HAND SURGERY    . HEMORRHOID SURGERY    . HIP  SURGERY    . VASCULAR SURGERY     stent R femoral artery    Prior to Admission medications   Medication Sig Start Date End Date Taking? Authorizing Provider  acetaminophen (TYLENOL) 500 MG tablet Take 500 mg by mouth daily as needed for mild pain or moderate pain.    Yes [provider]  albuterol (PROAIR HFA) 108 (90 BASE) MCG/ACT inhaler Inhale 2 puffs into the lungs every 6 (six) hours as needed. For wheezing.   Yes [provider]  albuterol (PROVENTIL) (2.5 MG/3ML) 0.083% nebulizer solution Take 3 mLs (2.5 mg total) by nebulization every 4 (four) hours as needed for wheezing or shortness of breath. 09/28/16  Yes Sudini, Alveta Heimlich, MD  furosemide (LASIX) 40 MG tablet Take 0.5 tablets (20 mg total) by mouth daily. 01/04/17  Yes Sainani, Belia Heman, MD  magnesium hydroxide (MILK OF MAGNESIA) 400 MG/5ML suspension Take 30 mLs by mouth 2 (two) times daily as needed for mild constipation or moderate constipation. 11/02/15  Yes Theodoro Grist, MD  Melatonin 5 MG CAPS Take 10 mg by mouth at bedtime.   Yes [provider]  nitroGLYCERIN (NITROSTAT) 0.4 MG SL tablet Place 0.4 mg under the tongue every 5 (five) minutes as needed for chest pain. Reported on 10/19/2015   Yes [provider]  oxybutynin (DITROPAN-XL) 5 MG 24 hr tablet Take 5 mg by mouth daily as  needed.   Yes [provider]  tiotropium (SPIRIVA) 18 MCG inhalation capsule Place 1 capsule (18 mcg total) into inhaler and inhale every evening. 10/09/15 01/14/18 Yes Mody, Ulice Bold, MD  feeding supplement (BOOST / RESOURCE BREEZE) LIQD Take 1 Container by mouth 3 (three) times daily between meals. 11/02/15   Theodoro Grist, MD    Family History  Problem Relation Age of Onset  . COPD Father   . Bladder Cancer Neg Hx   . Kidney cancer Neg Hx   . Prostate cancer Neg Hx      Social History   Tobacco Use  . Smoking status: Former Smoker    Packs/day: 3.00    Years: 40.00    Pack years: 120.00    Types:  Cigarettes  . Smokeless tobacco: Never Used  . Tobacco comment: quit 62 years  Substance Use Topics  . Alcohol use: Yes    Alcohol/week: 1.8 oz    Types: 3 Glasses of wine per week    Comment: glass of wine at night  . Drug use: No    Allergies as of 03/04/2017 - Review Complete 03/04/2017  Allergen Reaction Noted  . Iodine (kelp) [iodine] Nausea And Vomiting     Review of Systems:    All systems reviewed and negative except where noted in HPI.   Physical Exam:  Vital signs in last 24 hours: Temp:  [97.5 F (36.4 C)-97.6 F (36.4 C)] 97.6 F (36.4 C) (11/24 1003) Pulse Rate:  [83-94] 83 (11/24 1003) Resp:  [16-27] 18 (11/24 1003) BP: (140-171)/(87-116) 161/87 (11/24 1003) SpO2:  [98 %-100 %] 100 % (11/24 1003) Weight:  [124 lb (56.2 kg)] 124 lb (56.2 kg) (11/24 0634) Last BM Date: 03/04/17 General:   Pleasant, cooperative in NAD Head:  Normocephalic and atraumatic. Eyes:   No icterus.   Conjunctiva pink. PERRLA. Ears:  Normal auditory acuity. Neck:  Supple; no masses or thyroidomegaly Lungs: Respirations even and unlabored. Lungs clear to auscultation bilaterally.   No wheezes, crackles, or rhonchi.  Heart:  Regular rate and rhythm;  Without murmur, clicks, rubs or gallops Abdomen:  Soft, nondistended, nontender. Normal bowel sounds. No appreciable masses or hepatomegaly.  No rebound or guarding.  Rectal:  Revealed formed stool in the rectal vault which was dark maroon colored. There was no fresh blood seen. Msk:  Symmetrical without gross deformities.  Strength appropriate for his age Extremities:  Without edema, cyanosis or clubbing. Neurologic:  Alert and oriented x3;  grossly normal neurologically. Skin:  Intact without significant lesions or rashes. Cervical Nodes:  No significant cervical adenopathy. Psych:  Alert and cooperative. Normal affect.  LAB RESULTS: CBC Latest Ref Rng & Units 03/04/2017 03/04/2017 01/20/2017  WBC 3.8 - 10.6 K/uL - 6.8 8.3  Hemoglobin  13.0 - 18.0 g/dL 12.2(L) 12.4(L) 13.3  Hematocrit 40.0 - 52.0 % - 36.5(L) 38.6(L)  Platelets 150 - 440 K/uL - 195 157    BMET BMP Latest Ref Rng & Units 03/04/2017 01/20/2017 01/19/2017  Glucose 65 - 99 mg/dL 110(H) 94 120(H)  BUN 6 - 20 mg/dL 31(H) 15 22(H)  Creatinine 0.61 - 1.24 mg/dL 0.93 0.86 0.98  Sodium 135 - 145 mmol/L 134(L) 135 131(L)  Potassium 3.5 - 5.1 mmol/L 4.2 4.0 4.3  Chloride 101 - 111 mmol/L 101 100(L) 97(L)  CO2 22 - 32 mmol/L 26 28 26   Calcium 8.9 - 10.3 mg/dL 9.2 8.9 8.7(L)    LFT Hepatic Function Latest Ref Rng & Units 03/04/2017 01/19/2017 12/31/2016  Total Protein 6.5 - 8.1 g/dL 6.3(L) 5.8(L) 6.3(L)  Albumin 3.5 - 5.0 g/dL 3.1(L) 3.0(L) 3.1(L)  AST 15 - 41 U/L 27 25 26   ALT 17 - 63 U/L 20 19 22   Alk Phosphatase 38 - 126 U/L 41 40 41  Total Bilirubin 0.3 - 1.2 mg/dL 0.9 0.6 0.8     STUDIES: No results found.    Impression / Plan:   Daniel Dickson is a 83 y.o. male with history of mild systolic heart failure, ischemic cardiomyopathy, coronary artery disease status post PCI in 2012, hypertension, COPD on home oxygen 3 L presents with rectal bleeding/hematochezia. He has chronic normocytic anemia which is at his baseline. Differentials include most likely lower GI bleed such as diverticular or dieulafoy's or hemorrhoidal and very less likely upper GI bleed.  -Discussed with him about colonoscopy +/- EGD and he is agreeable -Start him on MiraLAX twice a day and fiber daily -Since he is hemodynamically stable, and hemoglobin at baseline, we will plan for colonoscopy on Monday -Monitor CBC closely  -Check ferritin, B12, folate levels -Full liquid diet -Will start bowel prep tomorrow  I have discussed alternative options, risks & benefits,  which include, but are not limited to, bleeding, infection, perforation,respiratory complication & drug reaction.  The patient agrees with this plan & written consent will be obtained.    Thank you for involving  me in the care of this patient.      LOS: 0 days   Sherri Sear, MD  03/04/2017, 3:09 PM   Note: This dictation was prepared with Dragon dictation along with smaller phrase technology. Any transcriptional errors that result from this process are unintentional.

## 2017-03-04 NOTE — ED Triage Notes (Signed)
Pt with bright red rectal bleeding since this am. Pt with indwelling urinary cath and is oxygen dependent. Pt complains of rectal pain. Pt with pwd skin.

## 2017-03-04 NOTE — ED Notes (Signed)
Report to donald, rn.  

## 2017-03-04 NOTE — ED Notes (Signed)
BB Recollect sent

## 2017-03-04 NOTE — ED Notes (Signed)
Pt states was having a "hard" bowel movement last pm when he had rectal pain and bleeding. Ems states they changed pt's brief and noted a small amount of bright red blood noted in brief. Pt states rectum is painful during bowel movement and when sitting.

## 2017-03-04 NOTE — ED Provider Notes (Addendum)
[facility] Emergency Department Provider Note  Time seen: 7:15 AM  I have reviewed the triage vital signs and the nursing notes.   HISTORY  Chief Complaint Rectal Bleeding    HPI Daniel Dickson is a 83 y.o. male with a past medical history of COPD, hypertension, PTSD, CHF, urinary retention with indwelling catheter presents to the emergency department for rectal bleeding.  According to the patient and report patient had large volume of bright red blood per rectum this morning.  Patient states intermittent pain at his rectum, but denies any currently.  Patient states he has had bleeding before, states he has had a hemorrhoidectomy 20 or 30 years ago.  Patient denies any abdominal pain, nausea or vomiting.  He states for the last few days he has noted dark/black stool.  Past Medical History:  Diagnosis Date  . Anxiety   . COPD (chronic obstructive pulmonary disease) ([facility])   . Coronary artery disease    a. 2012 s/p MI/cardiac arrest--> PCI mRCA.  Marland Kitchen. Hypertension   . Hypertensive heart disease   . Mixed hyperlipidemia   . PTSD (post-traumatic stress disorder)    with headaches  . Systolic CHF [facility])     Patient Active Problem List   Diagnosis Date Noted  . Shortness of breath 01/20/2017  . CHF (congestive heart failure) ([facility]) 01/02/2017  . Acute on chronic respiratory failure ([facility]) 11/11/2016  . Open wound of lower limb with tendon involvement, right, subsequent encounter   . Cellulitis of right leg 10/18/2016  . DNR (do not resuscitate) discussion   . Palliative care by specialist   . Adult failure to thrive   . Gross hematuria   . Acute respiratory failure ([facility]) 02/18/2016  . Urinary retention 11/10/2015  . Hyperkalemia 11/02/2015  . Hypotension 11/02/2015  . Dysphagia, pharyngoesophageal phase 11/02/2015  . Esophageal spasm 11/02/2015  . Constipation 11/02/2015  . SIADH (syndrome of inappropriate ADH production) ([facility]) 11/02/2015  . Urinary  retention due to benign prostatic hyperplasia 11/02/2015  . Chronic indwelling Foley catheter 11/02/2015  . Bacteriuria with pyuria 11/02/2015  . Protein-calorie malnutrition, severe 10/30/2015  . ARF (acute renal failure) ([facility]) 10/29/2015  . Dyspnea 10/07/2015  . Hyponatremia 06/06/2015  . Coronary artery disease   . Hypertensive heart disease   . Mixed hyperlipidemia   . COPD (chronic obstructive pulmonary disease) ([facility])   . COPD exacerbation ([facility]) 01/11/2015  . Moderate COPD (chronic obstructive pulmonary disease) ([facility]) 12/08/2014  . Fall 09/03/2014  . Hip fracture ([facility]) 09/03/2014  . PTSD (post-traumatic stress disorder) 09/03/2014  . Bilateral leg edema 09/03/2014  . SOB (shortness of breath) 10/31/2012  . Essential hypertension 10/31/2012  . CAD (coronary artery disease) 10/31/2012  . Hyperlipidemia 10/31/2012  . Tachycardia 10/31/2012    Past Surgical History:  Procedure Laterality Date  . CATARACT EXTRACTION    . CHOLECYSTECTOMY    . CORONARY ANGIOPLASTY     with stent; [facility]  . HAND SURGERY    . HEMORRHOID SURGERY    . HIP SURGERY    . VASCULAR SURGERY     stent R femoral artery    Prior to Admission medications   Medication Sig Start Date End Date Taking? Authorizing Provider  acetaminophen (TYLENOL) 500 MG tablet Take 500 mg by mouth daily as needed for mild pain or moderate pain.     [provider]  albuterol (PROAIR HFA) 108 (90 BASE) MCG/ACT inhaler Inhale 2 puffs into the lungs every 6 (six) hours as  needed. For wheezing.    [provider]  albuterol (PROVENTIL) (2.5 MG/3ML) 0.083% nebulizer solution Take 3 mLs (2.5 mg total) by nebulization every 4 (four) hours as needed for wheezing or shortness of breath. 09/28/16   Milagros LollSudini, Srikar, MD  feeding supplement (BOOST / RESOURCE BREEZE) LIQD Take 1 Container by mouth 3 (three) times daily between meals. 11/02/15   Katharina CaperVaickute, Rima, MD  furosemide (LASIX) 40 MG tablet Take 0.5 tablets (20 mg total)  by mouth daily. 01/04/17   Houston SirenSainani, Vivek J, MD  magnesium hydroxide (MILK OF MAGNESIA) 400 MG/5ML suspension Take 30 mLs by mouth 2 (two) times daily as needed for mild constipation or moderate constipation. 11/02/15   Katharina CaperVaickute, Rima, MD  Melatonin 5 MG CAPS Take 10 mg by mouth at bedtime.    [provider]  nitroGLYCERIN (NITROSTAT) 0.4 MG SL tablet Place 0.4 mg under the tongue every 5 (five) minutes as needed for chest pain. Reported on 10/19/2015    [provider]  oxybutynin (DITROPAN-XL) 5 MG 24 hr tablet Take 5 mg by mouth daily as needed.    [provider]  tiotropium (SPIRIVA) 18 MCG inhalation capsule Place 1 capsule (18 mcg total) into inhaler and inhale every evening. 10/09/15 01/14/18  Adrian SaranMody, Sital, MD    Allergies  Allergen Reactions  . Iodine (Kelp) [Iodine] Nausea And Vomiting    Family History  Problem Relation Age of Onset  . COPD Father   . Bladder Cancer Neg Hx   . Kidney cancer Neg Hx   . Prostate cancer Neg Hx     Social History Social History   Tobacco Use  . Smoking status: Former Smoker    Packs/day: 3.00    Years: 40.00    Pack years: 120.00    Types: Cigarettes  . Smokeless tobacco: Never Used  . Tobacco comment: quit 62 years  Substance Use Topics  . Alcohol use: Yes    Alcohol/week: 1.8 oz    Types: 3 Glasses of wine per week    Comment: glass of wine at night  . Drug use: No    Review of Systems Constitutional: Negative for fever. Cardiovascular: Negative for chest pain. Respiratory: Negative for shortness of breath. Gastrointestinal: Negative for abdominal pain, vomiting.  Positive for rectal bleeding and dark stool.  Intermittent rectal pain Genitourinary: Indwelling Foley catheter Neurological: Negative for headache All other ROS negative  ____________________________________________   PHYSICAL EXAM:  VITAL SIGNS: ED Triage Vitals  Enc Vitals Group     BP 03/04/17 0633 (!) 149/87     Pulse Rate  03/04/17 0633 94     Resp 03/04/17 0633 18     Temp 03/04/17 0633 (!) 97.5 F (36.4 C)     Temp Source 03/04/17 0633 Oral     SpO2 03/04/17 0633 98 %     Weight 03/04/17 0634 124 lb (56.2 kg)     Height 03/04/17 0634 5\' 6"  (1.676 m)     Head Circumference --      Peak Flow --      Pain Score 03/04/17 0632 7     Pain Loc --      Pain Edu? --      Excl. in GC? --     Constitutional: Alert. Well appearing and in no distress.  Appears younger than stated age. Eyes: Normal exam ENT   Head: Normocephalic and atraumatic.   Mouth/Throat: Mucous membranes are moist. Cardiovascular: Normal rate, regular rhythm.  Respiratory: Normal respiratory  effort without tachypnea nor retractions. Breath sounds are clear  Gastrointestinal: Soft and nontender. No distention.  On rectal examination patient does have a moderate sized hemorrhoid, there is gross blood as well as dark stool, strongly guaiac positive. Musculoskeletal: Nontender with normal range of motion in all extremities. Neurologic:  Normal speech and language. No gross focal neurologic deficits Skin:  Skin is warm, dry and intact.  Psychiatric: Mood and affect are normal.   ____________________________________________  INITIAL IMPRESSION / ASSESSMENT AND PLAN / ED COURSE  Pertinent labs & imaging results that were available during my care of the patient were reviewed by me and considered in my medical decision making (see chart for details).  The patient presents to the emergency department for rectal bleeding which started this morning, patient coming from private residence.  Differential includes upper versus lower GI bleed, hemorrhoidal bleeding, colitis/diverticulitis, diverticulosis. Overall the patient appears very well, much younger than stated age.  Does not appear to be on any anticoagulation.  Has a completely nontender abdomen.  Rectal exam shows dark stool as well as bright red blood moderate amount, strongly guaiac  positive.  We will check labs, type and screen, and closely monitor in the emergency department.  Given rectal examination findings anticipate admission to the hospital.  Patient states he has VA benefits but does not want to go to the [place].  I reviewed the patient's medical records including 3 recent discharge summaries in the past 3 months all for respiratory issues, noncontributory to today's visit.  Patient's hemoglobin is stable.  Patient will be admitted to the hospitalist service for further treatment.  I discussed the patient with Dr. Dorene Sorrow of GI medicine as well.  ____________________________________________   FINAL CLINICAL IMPRESSION(S) / ED DIAGNOSES  GI bleed    Minna Antis, MD 03/04/17 [id number]    Minna Antis, MD 03/04/17 [phone]

## 2017-03-05 LAB — CBC
HCT: 37.2 % — ABNORMAL LOW (ref 40.0–52.0)
Hemoglobin: 12.4 g/dL — ABNORMAL LOW (ref 13.0–18.0)
MCH: 31.9 pg (ref 26.0–34.0)
MCHC: 33.4 g/dL (ref 32.0–36.0)
MCV: 95.3 fL (ref 80.0–100.0)
PLATELETS: 188 10*3/uL (ref 150–440)
RBC: 3.91 MIL/uL — ABNORMAL LOW (ref 4.40–5.90)
RDW: 15.6 % — ABNORMAL HIGH (ref 11.5–14.5)
WBC: 8.6 10*3/uL (ref 3.8–10.6)

## 2017-03-05 LAB — BASIC METABOLIC PANEL
Anion gap: 6 (ref 5–15)
BUN: 28 mg/dL — ABNORMAL HIGH (ref 6–20)
CALCIUM: 9 mg/dL (ref 8.9–10.3)
CO2: 29 mmol/L (ref 22–32)
CREATININE: 0.87 mg/dL (ref 0.61–1.24)
Chloride: 99 mmol/L — ABNORMAL LOW (ref 101–111)
GFR calc non Af Amer: >60 mL/min (ref >60)
GLUCOSE: 94 mg/dL (ref 65–99)
Potassium: 4.2 mmol/L (ref 3.5–5.1)
Sodium: 134 mmol/L — ABNORMAL LOW (ref 135–145)

## 2017-03-05 LAB — HEMOGLOBIN: HEMOGLOBIN: 13.3 g/dL (ref 13.0–18.0)

## 2017-03-05 MED ORDER — POLYETHYLENE GLYCOL 3350 17 G PO PACK: 17.0000 g | PACK | Freq: Every day | ORAL | 0 refills | Status: DC

## 2017-03-05 MED ORDER — METOPROLOL TARTRATE 25 MG PO TABS: 25.0000 mg | ORAL_TABLET | Freq: Two times a day (BID) | ORAL | 0 refills | Status: DC

## 2017-03-05 MED ORDER — PSYLLIUM 95 % PO PACK
1.0000 | PACK | Freq: Every day | ORAL | Status: DC
Start: 2017-03-05 — End: 2017-04-14

## 2017-03-05 NOTE — Progress Notes (Signed)
O2 delivered to room, Pt wheeled to car with 2L O2.

## 2017-03-05 NOTE — Progress Notes (Signed)
DISCHARGE NOTE:  Pts daughter given discharge instructions. She verbalized understanding. Pt waiting on portable O2 tank to be delivered to the room.

## 2017-03-05 NOTE — Progress Notes (Signed)
MD Mody notified that pt pulled off IV and tel monitor. Pt stating he doesn't know where he is. Pts BP is elevated 170/80. No new orders. Per MD Mody pt ok to discharge.

## 2017-03-05 NOTE — Care Management Note (Signed)
Case Management Note  Patient Details  Name: Daniel Dickson MRN: [id number] Date of Birth: 10/02/1912  Subjective/Objective:     [facility], RN, Aide, SW, OT               Action/Plan:   Expected Discharge Date:  03/05/17               Expected Discharge Plan:  Home w Home Health Services  In-House Referral:     Discharge planning Services  CM Consult  Post Acute Care Choice:  Home Health Choice offered to:  Patient  DME Arranged:    DME Agency:     HH Arranged:  PT HH Agency:  [facility]  Status of Service:  Completed, signed off  If discussed at Long Length of Stay Meetings, dates discussed:    Additional Comments:  Belicia Difatta A, RN 03/05/2017, 9:26 AM

## 2017-03-05 NOTE — Progress Notes (Signed)
Pts daughter Elease Hashimotoatricia at the bedside stating pt does not use oxygen chronically. Pt has been on O2 this hospital stay. MD Mody notified. Per MD pt needs to d/c with O2. Case manger notified. Per case manger portable O2 tank is being delivered to the bedside.

## 2017-03-05 NOTE — Care Management Note (Signed)
Case Management Note  Patient Details  Name: Daniel Dickson MRN: [id number] Date of Birth: 09/06/1912  Subjective/Objective:      Call to Shaune LeeksJermaine Jenkins at [facility] to request delivery of a portable 02 tank today to [facility] [name]. Mr Eads is on chronic 02 at home.                Action/Plan:   Expected Discharge Date:  03/05/17               Expected Discharge Plan:  Home w Home Health Services  In-House Referral:     Discharge planning Services  CM Consult  Post Acute Care Choice:  Home Health Choice offered to:  Patient  DME Arranged:    DME Agency:     HH Arranged:  PT HH Agency:  [facility]  Status of Service:  Completed, signed off  If discussed at Long Length of Stay Meetings, dates discussed:    Additional Comments:  Itamar Mcgowan A, RN 03/05/2017, 1:34 PM

## 2017-03-05 NOTE — Discharge Summary (Signed)
[facility] [place] [facility]   PATIENT NAME: Daniel Dickson    MR#:  [id number]  DATE OF BIRTH:  02-13-1913  DATE OF ADMISSION:  03/04/2017 ADMITTING PHYSICIAN: Adrian Saran, MD  DATE OF DISCHARGE: 03/05/2017  PRIMARY CARE PHYSICIAN: [name], MD    ADMISSION DIAGNOSIS:  Rectal bleeding [K62.5]  DISCHARGE DIAGNOSIS:  Active Problems:   GIB (gastrointestinal bleeding)   SECONDARY DIAGNOSIS:   Past Medical History:  Diagnosis Date  . Anxiety   . COPD (chronic obstructive pulmonary disease) (HCC)   . Coronary artery disease    a. 2012 s/p MI/cardiac arrest--> PCI mRCA.  Marland Kitchen Hypertension   . Hypertensive heart disease   . Mixed hyperlipidemia   . PTSD (post-traumatic stress disorder)    with headaches  . Systolic CHF [facility])     HOSPITAL COURSE:  83 year old male with history of COPD and chronic respiratory failure on 2 L of oxygen with chronic Foley catheter due to urinary retention who presented with rectal bleeding.  1. Rectal bleeding: Patient was guaiac positive. Rectal bleeding was likely diverticular in nature. Patient hemoglobin remained stable at 12. Patient was evaluated by GI. Initially there was plans for colonoscopy. After discussing colonoscopy with the patient as well as patient's family they have decided not to undergo colonoscopy given his hemoglobin has remained stable and he has had no further evidence of GI bleed. He will continue with MiraLAX and fiber daily.  2. Chronic urinary retention with chronic indwelling Foley catheter: Patient will have this replaced every 30 days.  3. Chronic hypoxic respiratory failure with 2 L of oxygen due to COPD without signs of exacerbation: Patient will continue nebulizer treatments and inhalers.  4. Essential hypertension: Patient is on metoprolol 25 twice a day and will need close follow-up for his blood pressure.   DISCHARGE CONDITIONS AND DIET:   Stable for discharge and regular  diet  CONSULTS OBTAINED:  Treatment Team:  Toney Reil, MD  DRUG ALLERGIES:   Allergies  Allergen Reactions  . Iodine (Kelp) [Iodine] Nausea And Vomiting    DISCHARGE MEDICATIONS:   Current Discharge Medication List    START taking these medications   Details  metoprolol tartrate (LOPRESSOR) 25 MG tablet Take 1 tablet (25 mg total) by mouth 2 (two) times daily. Qty: 60 tablet, Refills: 0    polyethylene glycol (MIRALAX / GLYCOLAX) packet Take 17 g by mouth daily. Qty: 14 each, Refills: 0    psyllium (HYDROCIL/METAMUCIL) 95 % PACK Take 1 packet by mouth daily. Qty: 56 each      CONTINUE these medications which have NOT CHANGED   Details  acetaminophen (TYLENOL) 500 MG tablet Take 500 mg by mouth daily as needed for mild pain or moderate pain.     albuterol (PROAIR HFA) 108 (90 BASE) MCG/ACT inhaler Inhale 2 puffs into the lungs every 6 (six) hours as needed. For wheezing.    albuterol (PROVENTIL) (2.5 MG/3ML) 0.083% nebulizer solution Take 3 mLs (2.5 mg total) by nebulization every 4 (four) hours as needed for wheezing or shortness of breath. Qty: 200 mL, Refills: 12    furosemide (LASIX) 40 MG tablet Take 0.5 tablets (20 mg total) by mouth daily. Qty: 30 tablet, Refills: 1    magnesium hydroxide (MILK OF MAGNESIA) 400 MG/5ML suspension Take 30 mLs by mouth 2 (two) times daily as needed for mild constipation or moderate constipation. Qty: 360 mL, Refills: 0    Melatonin 5 MG CAPS Take 10 mg  by mouth at bedtime.    nitroGLYCERIN (NITROSTAT) 0.4 MG SL tablet Place 0.4 mg under the tongue every 5 (five) minutes as needed for chest pain. Reported on 10/19/2015    oxybutynin (DITROPAN-XL) 5 MG 24 hr tablet Take 5 mg by mouth daily as needed.    tiotropium (SPIRIVA) 18 MCG inhalation capsule Place 1 capsule (18 mcg total) into inhaler and inhale every evening. Qty: 30 capsule, Refills: 12    feeding supplement (BOOST / RESOURCE BREEZE) LIQD Take 1 Container by  mouth 3 (three) times daily between meals. Qty: 90 Container, Refills: 5          Today   CHIEF COMPLAINT:   No further rectal bleeding. Patient does not want colonoscopy.   VITAL SIGNS:  Blood pressure (!) 171/88, pulse 73, temperature 97.7 F (36.5 C), temperature source Oral, resp. rate 20, height 5\' 6"  (1.676 m), weight 56.2 kg (124 lb), SpO2 96 %.   REVIEW OF SYSTEMS:  Review of Systems  Constitutional: Negative.  Negative for chills, fever and malaise/fatigue.  HENT: Negative.  Negative for ear discharge, ear pain, hearing loss, nosebleeds and sore throat.   Eyes: Negative.  Negative for blurred vision and pain.  Respiratory: Negative.  Negative for cough, hemoptysis, shortness of breath and wheezing.   Cardiovascular: Negative.  Negative for chest pain, palpitations and leg swelling.  Gastrointestinal: Negative.  Negative for abdominal pain, blood in stool, diarrhea, nausea and vomiting.  Genitourinary: Negative.  Negative for dysuria.  Musculoskeletal: Negative.  Negative for back pain.  Skin: Negative.   Neurological: Negative for dizziness, tremors, speech change, focal weakness, seizures and headaches.  Endo/Heme/Allergies: Negative.  Does not bruise/bleed easily.  Psychiatric/Behavioral: Negative for depression, hallucinations and suicidal ideas. The patient is nervous/anxious.      PHYSICAL EXAMINATION:  GENERAL:  83 y.o.-year-old patient lying in the bed with no acute distress. Frail appearing wearing 2 L of oxygen which is chronic NECK:  Supple, no jugular venous distention. No thyroid enlargement, no tenderness.  LUNGS: Normal breath sounds bilaterally, no wheezing, rales,rhonchi  No use of accessory muscles of respiration.  CARDIOVASCULAR: S1, S2 normal. No murmurs, rubs, or gallops.  ABDOMEN: Soft, non-tender, non-distended. Bowel sounds present. No organomegaly or mass.  EXTREMITIES: No pedal edema, cyanosis, or clubbing.  PSYCHIATRIC: The patient is  alert and oriented x 3.  SKIN: No obvious rash, lesion, or ulcer.   DATA REVIEW:   CBC Recent Labs  Lab 03/05/17 0154  WBC 8.6  HGB 12.4*  HCT 37.2*  PLT 188    Chemistries  Recent Labs  Lab 03/04/17 0639 03/05/17 0154  NA 134* 134*  K 4.2 4.2  CL 101 99*  CO2 26 29  GLUCOSE 110* 94  BUN 31* 28*  CREATININE 0.93 0.87  CALCIUM 9.2 9.0  AST 27  --   ALT 20  --   ALKPHOS 41  --   BILITOT 0.9  --     Cardiac Enzymes No results for input(s): TROPONINI in the last 168 hours.  Microbiology Results  @MICRORSLT48 @  RADIOLOGY:  No results found.    Current Discharge Medication List    START taking these medications   Details  metoprolol tartrate (LOPRESSOR) 25 MG tablet Take 1 tablet (25 mg total) by mouth 2 (two) times daily. Qty: 60 tablet, Refills: 0    polyethylene glycol (MIRALAX / GLYCOLAX) packet Take 17 g by mouth daily. Qty: 14 each, Refills: 0    psyllium (HYDROCIL/METAMUCIL) 95 % PACK  Take 1 packet by mouth daily. Qty: 56 each      CONTINUE these medications which have NOT CHANGED   Details  acetaminophen (TYLENOL) 500 MG tablet Take 500 mg by mouth daily as needed for mild pain or moderate pain.     albuterol (PROAIR HFA) 108 (90 BASE) MCG/ACT inhaler Inhale 2 puffs into the lungs every 6 (six) hours as needed. For wheezing.    albuterol (PROVENTIL) (2.5 MG/3ML) 0.083% nebulizer solution Take 3 mLs (2.5 mg total) by nebulization every 4 (four) hours as needed for wheezing or shortness of breath. Qty: 200 mL, Refills: 12    furosemide (LASIX) 40 MG tablet Take 0.5 tablets (20 mg total) by mouth daily. Qty: 30 tablet, Refills: 1    magnesium hydroxide (MILK OF MAGNESIA) 400 MG/5ML suspension Take 30 mLs by mouth 2 (two) times daily as needed for mild constipation or moderate constipation. Qty: 360 mL, Refills: 0    Melatonin 5 MG CAPS Take 10 mg by mouth at bedtime.    nitroGLYCERIN (NITROSTAT) 0.4 MG SL tablet Place 0.4 mg under the tongue  every 5 (five) minutes as needed for chest pain. Reported on 10/19/2015    oxybutynin (DITROPAN-XL) 5 MG 24 hr tablet Take 5 mg by mouth daily as needed.    tiotropium (SPIRIVA) 18 MCG inhalation capsule Place 1 capsule (18 mcg total) into inhaler and inhale every evening. Qty: 30 capsule, Refills: 12    feeding supplement (BOOST / RESOURCE BREEZE) LIQD Take 1 Container by mouth 3 (three) times daily between meals. Qty: 90 Container, Refills: 5           Management plans discussed with the patient and he is in agreement. Stable for discharge   Patient should follow up with pcp  CODE STATUS:     Code Status Orders  (From admission, onward)        Start     Ordered   03/04/17 1039  Full code  Continuous     03/04/17 1038    Code Status History    Date Active Date Inactive Code Status Order ID Comments User Context   03/04/2017 09:59 03/04/2017 10:38 DNR [id number]  Adrian SaranMody, Arrin Pintor, MD Inpatient   01/20/2017 04:46 01/21/2017 16:22 Full Code [id number]  Tonye RoyaltyHugelmeyer, Alexis, DO Inpatient   01/02/2017 19:37 01/03/2017 17:59 DNR [id number]  Milagros LollSudini, Srikar, MD ED   11/12/2016 09:32 11/13/2016 15:37 Full Code [id number]  Gasper LloydJacobs, Monique D, RN Inpatient   11/11/2016 11:23 11/12/2016 09:32 DNR [id number]  Enedina FinnerPatel, Sona, MD Inpatient   10/18/2016 20:36 10/20/2016 19:52 DNR [id number]  Milagros LollSudini, Srikar, MD ED   09/27/2016 15:00 09/28/2016 17:19 DNR [id number]  Altamese DillingVachhani, Vaibhavkumar, MD Inpatient   09/24/2016 08:52 09/27/2016 15:00 Full Code [id number]  Enid BaasKalisetti, Radhika, MD Inpatient   08/30/2016 05:16 09/02/2016 16:04 Full Code [id number]  Arnaldo Nataliamond, Michael S, MD Inpatient   02/18/2016 08:27 02/22/2016 18:50 Full Code [id number]  Enedina FinnerPatel, Sona, MD Inpatient   10/29/2015 21:19 11/02/2015 14:22 Full Code [id number]  Enid BaasKalisetti, Radhika, MD Inpatient   10/07/2015 07:32 10/09/2015 16:29 Full Code [id number]  Ihor AustinPyreddy, Pavan, MD Inpatient   06/07/2015 00:04 06/10/2015 16:31 Full Code [id number]  Oralia ManisWillis, David, MD Inpatient   01/11/2015  13:54 01/12/2015 13:58 Full Code [id number]  Alford HighlandWieting, Richard, MD ED    Advance Directive Documentation     Most Recent Value  Type of Advance Directive  Healthcare Power of Attorney, Living will  Pre-existing out of facility DNR order (yellow form or pink MOST form)  No  data  "MOST" Form in Place?  No data      TOTAL TIME TAKING CARE OF THIS PATIENT: 37 minutes.    Note: This dictation was prepared with Dragon dictation along with smaller phrase technology. Any transcriptional errors that result from this process are unintentional.  Giovany Cosby M.D on 03/05/2017 at 8:04 AM  Between 7am to 6pm - Pager - [phone] After 6pm go to [email or web address] - password [email or web address]  [facility]  Office  [phone]  CC: Primary care physician; Dortha KernBliss, Laura K, MD

## 2017-03-21 ENCOUNTER — Other Ambulatory Visit: Payer: Self-pay

## 2017-03-21 ENCOUNTER — Encounter: Payer: Self-pay | Admitting: *Deleted

## 2017-03-21 ENCOUNTER — Observation Stay
Admission: EM | Admit: 2017-03-21 | Discharge: 2017-03-23 | Disposition: A | Discharge status: Home / Self Care | Payer: Medicare Other | Attending: Internal Medicine | Admitting: Internal Medicine

## 2017-03-21 DIAGNOSIS — F4310 Post-traumatic stress disorder, unspecified: Secondary | ICD-10-CM | POA: Insufficient documentation

## 2017-03-21 DIAGNOSIS — Z87891 Personal history of nicotine dependence: Secondary | ICD-10-CM | POA: Insufficient documentation

## 2017-03-21 DIAGNOSIS — B952 Enterococcus as the cause of diseases classified elsewhere: Secondary | ICD-10-CM | POA: Insufficient documentation

## 2017-03-21 DIAGNOSIS — Z515 Encounter for palliative care: Secondary | ICD-10-CM | POA: Diagnosis not present

## 2017-03-21 DIAGNOSIS — K6289 Other specified diseases of anus and rectum: Secondary | ICD-10-CM | POA: Diagnosis not present

## 2017-03-21 DIAGNOSIS — R531 Weakness: Secondary | ICD-10-CM | POA: Diagnosis not present

## 2017-03-21 DIAGNOSIS — J9610 Chronic respiratory failure, unspecified whether with hypoxia or hypercapnia: Secondary | ICD-10-CM | POA: Insufficient documentation

## 2017-03-21 DIAGNOSIS — K648 Other hemorrhoids: Secondary | ICD-10-CM | POA: Diagnosis not present

## 2017-03-21 DIAGNOSIS — N400 Enlarged prostate without lower urinary tract symptoms: Secondary | ICD-10-CM | POA: Diagnosis not present

## 2017-03-21 DIAGNOSIS — Z888 Allergy status to other drugs, medicaments and biological substances status: Secondary | ICD-10-CM | POA: Diagnosis not present

## 2017-03-21 DIAGNOSIS — E222 Syndrome of inappropriate secretion of antidiuretic hormone: Secondary | ICD-10-CM | POA: Insufficient documentation

## 2017-03-21 DIAGNOSIS — E43 Unspecified severe protein-calorie malnutrition: Secondary | ICD-10-CM | POA: Insufficient documentation

## 2017-03-21 DIAGNOSIS — I255 Ischemic cardiomyopathy: Secondary | ICD-10-CM | POA: Diagnosis not present

## 2017-03-21 DIAGNOSIS — Z86718 Personal history of other venous thrombosis and embolism: Secondary | ICD-10-CM | POA: Insufficient documentation

## 2017-03-21 DIAGNOSIS — Z7189 Other specified counseling: Secondary | ICD-10-CM

## 2017-03-21 DIAGNOSIS — E782 Mixed hyperlipidemia: Secondary | ICD-10-CM | POA: Insufficient documentation

## 2017-03-21 DIAGNOSIS — I5022 Chronic systolic (congestive) heart failure: Secondary | ICD-10-CM | POA: Insufficient documentation

## 2017-03-21 DIAGNOSIS — E875 Hyperkalemia: Secondary | ICD-10-CM | POA: Insufficient documentation

## 2017-03-21 DIAGNOSIS — J449 Chronic obstructive pulmonary disease, unspecified: Secondary | ICD-10-CM | POA: Diagnosis not present

## 2017-03-21 DIAGNOSIS — Z955 Presence of coronary angioplasty implant and graft: Secondary | ICD-10-CM | POA: Insufficient documentation

## 2017-03-21 DIAGNOSIS — I110 Hypertensive heart disease with heart failure: Secondary | ICD-10-CM | POA: Insufficient documentation

## 2017-03-21 DIAGNOSIS — B9689 Other specified bacterial agents as the cause of diseases classified elsewhere: Secondary | ICD-10-CM | POA: Diagnosis not present

## 2017-03-21 DIAGNOSIS — R748 Abnormal levels of other serum enzymes: Secondary | ICD-10-CM

## 2017-03-21 DIAGNOSIS — Z79899 Other long term (current) drug therapy: Secondary | ICD-10-CM | POA: Insufficient documentation

## 2017-03-21 DIAGNOSIS — I252 Old myocardial infarction: Secondary | ICD-10-CM | POA: Diagnosis not present

## 2017-03-21 DIAGNOSIS — I248 Other forms of acute ischemic heart disease: Secondary | ICD-10-CM | POA: Insufficient documentation

## 2017-03-21 DIAGNOSIS — Z9849 Cataract extraction status, unspecified eye: Secondary | ICD-10-CM | POA: Insufficient documentation

## 2017-03-21 DIAGNOSIS — T83511A Infection and inflammatory reaction due to indwelling urethral catheter, initial encounter: Secondary | ICD-10-CM

## 2017-03-21 DIAGNOSIS — R7989 Other specified abnormal findings of blood chemistry: Secondary | ICD-10-CM | POA: Diagnosis present

## 2017-03-21 DIAGNOSIS — F419 Anxiety disorder, unspecified: Secondary | ICD-10-CM | POA: Diagnosis not present

## 2017-03-21 DIAGNOSIS — K649 Unspecified hemorrhoids: Secondary | ICD-10-CM | POA: Insufficient documentation

## 2017-03-21 DIAGNOSIS — R319 Hematuria, unspecified: Secondary | ICD-10-CM | POA: Insufficient documentation

## 2017-03-21 DIAGNOSIS — Z9981 Dependence on supplemental oxygen: Secondary | ICD-10-CM | POA: Insufficient documentation

## 2017-03-21 DIAGNOSIS — I2510 Atherosclerotic heart disease of native coronary artery without angina pectoris: Secondary | ICD-10-CM | POA: Diagnosis not present

## 2017-03-21 DIAGNOSIS — N390 Urinary tract infection, site not specified: Secondary | ICD-10-CM | POA: Diagnosis not present

## 2017-03-21 DIAGNOSIS — Z7951 Long term (current) use of inhaled steroids: Secondary | ICD-10-CM | POA: Insufficient documentation

## 2017-03-21 DIAGNOSIS — Z1623 Resistance to quinolones and fluoroquinolones: Secondary | ICD-10-CM | POA: Diagnosis not present

## 2017-03-21 DIAGNOSIS — N4889 Other specified disorders of penis: Secondary | ICD-10-CM | POA: Diagnosis not present

## 2017-03-21 DIAGNOSIS — K5900 Constipation, unspecified: Secondary | ICD-10-CM | POA: Insufficient documentation

## 2017-03-21 DIAGNOSIS — Z681 Body mass index (BMI) 19 or less, adult: Secondary | ICD-10-CM | POA: Insufficient documentation

## 2017-03-21 DIAGNOSIS — R778 Other specified abnormalities of plasma proteins: Secondary | ICD-10-CM | POA: Diagnosis present

## 2017-03-21 DIAGNOSIS — Z9049 Acquired absence of other specified parts of digestive tract: Secondary | ICD-10-CM | POA: Insufficient documentation

## 2017-03-21 HISTORY — DX: Hematuria, unspecified: R31.9

## 2017-03-21 HISTORY — DX: Chronic systolic (congestive) heart failure: I50.22

## 2017-03-21 HISTORY — DX: Benign prostatic hyperplasia without lower urinary tract symptoms: N40.0

## 2017-03-21 HISTORY — DX: Ischemic cardiomyopathy: I25.5

## 2017-03-21 HISTORY — DX: Personal history of other diseases of the digestive system: Z87.19

## 2017-03-21 HISTORY — DX: Unspecified hemorrhoids: K64.9

## 2017-03-21 HISTORY — DX: Aneurysm of unspecified site: I72.9

## 2017-03-21 HISTORY — DX: Cellulitis, unspecified: L03.90

## 2017-03-21 HISTORY — DX: Acute embolism and thrombosis of unspecified deep veins of unspecified lower extremity: I82.409

## 2017-03-21 HISTORY — DX: Retention of urine, unspecified: R33.9

## 2017-03-21 LAB — URINALYSIS, COMPLETE (UACMP) WITH MICROSCOPIC
Bilirubin Urine: NEGATIVE
Glucose, UA: NEGATIVE mg/dL
Ketones, ur: NEGATIVE mg/dL
Nitrite: POSITIVE — AB
PH: 6 (ref 5.0–8.0)
Protein, ur: 100 mg/dL — AB
SPECIFIC GRAVITY, URINE: 1.02 (ref 1.005–1.030)

## 2017-03-21 LAB — BASIC METABOLIC PANEL
Anion gap: 10 (ref 5–15)
BUN: 28 mg/dL — AB (ref 6–20)
CALCIUM: 9.2 mg/dL (ref 8.9–10.3)
CO2: 24 mmol/L (ref 22–32)
CREATININE: 1.14 mg/dL (ref 0.61–1.24)
Chloride: 104 mmol/L (ref 101–111)
GFR calc Af Amer: 58 mL/min — ABNORMAL LOW (ref >60)
GFR calc non Af Amer: 50 mL/min — ABNORMAL LOW (ref >60)
Glucose, Bld: 104 mg/dL — ABNORMAL HIGH (ref 65–99)
POTASSIUM: 3.8 mmol/L (ref 3.5–5.1)
SODIUM: 138 mmol/L (ref 135–145)

## 2017-03-21 LAB — CBC
HCT: 32.8 % — ABNORMAL LOW (ref 40.0–52.0)
HCT: 34 % — ABNORMAL LOW (ref 40.0–52.0)
HEMOGLOBIN: 11.2 g/dL — AB (ref 13.0–18.0)
Hemoglobin: 10.9 g/dL — ABNORMAL LOW (ref 13.0–18.0)
MCH: 31.6 pg (ref 26.0–34.0)
MCH: 31.8 pg (ref 26.0–34.0)
MCHC: 32.9 g/dL (ref 32.0–36.0)
MCHC: 33.2 g/dL (ref 32.0–36.0)
MCV: 95.9 fL (ref 80.0–100.0)
MCV: 96.1 fL (ref 80.0–100.0)
PLATELETS: 169 10*3/uL (ref 150–440)
PLATELETS: 175 10*3/uL (ref 150–440)
RBC: 3.42 MIL/uL — AB (ref 4.40–5.90)
RBC: 3.54 MIL/uL — ABNORMAL LOW (ref 4.40–5.90)
RDW: 15.2 % — AB (ref 11.5–14.5)
RDW: 15.2 % — AB (ref 11.5–14.5)
WBC: 7.6 10*3/uL (ref 3.8–10.6)
WBC: 8.8 10*3/uL (ref 3.8–10.6)

## 2017-03-21 LAB — CBC WITH DIFFERENTIAL/PLATELET
Basophils Absolute: 0.1 10*3/uL (ref 0–0.1)
Basophils Relative: 1 %
EOS ABS: 0.3 10*3/uL (ref 0–0.7)
Eosinophils Relative: 4 %
HCT: 32.6 % — ABNORMAL LOW (ref 40.0–52.0)
HEMOGLOBIN: 11.1 g/dL — AB (ref 13.0–18.0)
LYMPHS ABS: 1.2 10*3/uL (ref 1.0–3.6)
Lymphocytes Relative: 16 %
MCH: 32.8 pg (ref 26.0–34.0)
MCHC: 34 g/dL (ref 32.0–36.0)
MCV: 96.3 fL (ref 80.0–100.0)
MONO ABS: 0.5 10*3/uL (ref 0.2–1.0)
MONOS PCT: 7 %
NEUTROS PCT: 72 %
Neutro Abs: 5.4 10*3/uL (ref 1.4–6.5)
Platelets: 167 10*3/uL (ref 150–440)
RBC: 3.38 MIL/uL — ABNORMAL LOW (ref 4.40–5.90)
RDW: 15.1 % — AB (ref 11.5–14.5)
WBC: 7.5 10*3/uL (ref 3.8–10.6)

## 2017-03-21 LAB — TROPONIN I
TROPONIN I: 0.17 ng/mL — AB (ref <0.03)
TROPONIN I: 0.25 ng/mL — AB (ref <0.03)
Troponin I: 0.24 ng/mL (ref <0.03)
Troponin I: 0.18 ng/mL (ref <0.03)
Troponin I: 0.18 ng/mL (ref <0.03)

## 2017-03-21 MED ORDER — HYDROCODONE-ACETAMINOPHEN 5-325 MG PO TABS
1.0000 | ORAL_TABLET | ORAL | Status: DC | PRN
  Administered 2017-03-21: 1 via ORAL
  Filled 2017-03-21: qty 1

## 2017-03-21 MED ORDER — HEPARIN SODIUM (PORCINE) 5000 UNIT/ML IJ SOLN
5000.0000 [IU] | Freq: Three times a day (TID) | INTRAMUSCULAR | Status: DC
  Administered 2017-03-21 – 2017-03-23 (×6): 5000 [IU] via SUBCUTANEOUS
  Filled 2017-03-21 (×7): qty 1

## 2017-03-21 MED ORDER — HYDROCORTISONE 2.5 % RE CREA
TOPICAL_CREAM | Freq: Two times a day (BID) | RECTAL | Status: DC
  Administered 2017-03-21: 10:00:00 via TOPICAL
  Administered 2017-03-21: 1 via TOPICAL
  Administered 2017-03-22 (×2): via TOPICAL
  Filled 2017-03-21 (×2): qty 28.35

## 2017-03-21 MED ORDER — ALBUTEROL SULFATE (2.5 MG/3ML) 0.083% IN NEBU
2.5000 mg | INHALATION_SOLUTION | RESPIRATORY_TRACT | Status: DC | PRN
  Administered 2017-03-21 (×2): 2.5 mg via RESPIRATORY_TRACT
  Filled 2017-03-21 (×2): qty 3

## 2017-03-21 MED ORDER — NITROGLYCERIN 0.4 MG SL SUBL: 0.4000 mg | SUBLINGUAL_TABLET | SUBLINGUAL | Status: DC | PRN

## 2017-03-21 MED ORDER — PANTOPRAZOLE SODIUM 40 MG PO TBEC
40.0000 mg | DELAYED_RELEASE_TABLET | Freq: Every day | ORAL | Status: DC
  Administered 2017-03-22 – 2017-03-23 (×2): 40 mg via ORAL
  Filled 2017-03-21 (×2): qty 1

## 2017-03-21 MED ORDER — MELATONIN 5 MG PO TABS
10.0000 mg | ORAL_TABLET | Freq: Every day | ORAL | Status: DC
  Administered 2017-03-21 – 2017-03-22 (×2): 10 mg via ORAL
  Filled 2017-03-21 (×3): qty 2

## 2017-03-21 MED ORDER — ACETAMINOPHEN 500 MG PO TABS: 500.0000 mg | ORAL_TABLET | Freq: Every day | ORAL | Status: DC | PRN

## 2017-03-21 MED ORDER — ONDANSETRON HCL 4 MG PO TABS: 4.0000 mg | ORAL_TABLET | Freq: Four times a day (QID) | ORAL | Status: DC | PRN

## 2017-03-21 MED ORDER — METHYLPREDNISOLONE SODIUM SUCC 125 MG IJ SOLR
60.0000 mg | INTRAMUSCULAR | Status: DC
  Administered 2017-03-21 – 2017-03-22 (×2): 60 mg via INTRAVENOUS
  Filled 2017-03-21 (×2): qty 2

## 2017-03-21 MED ORDER — VANCOMYCIN HCL IN DEXTROSE 1-5 GM/200ML-% IV SOLN
1000.0000 mg | Freq: Once | INTRAVENOUS | Status: AC
  Administered 2017-03-21: 1000 mg via INTRAVENOUS
  Filled 2017-03-21: qty 200

## 2017-03-21 MED ORDER — CEFTRIAXONE SODIUM IN DEXTROSE 20 MG/ML IV SOLN
1.0000 g | Freq: Once | INTRAVENOUS | Status: AC
  Administered 2017-03-21: 1 g via INTRAVENOUS
  Filled 2017-03-21: qty 50

## 2017-03-21 MED ORDER — ENSURE ENLIVE PO LIQD
237.0000 mL | Freq: Two times a day (BID) | ORAL | Status: DC
  Administered 2017-03-21 – 2017-03-22 (×4): 237 mL via ORAL

## 2017-03-21 MED ORDER — PANTOPRAZOLE SODIUM 40 MG IV SOLR
40.0000 mg | INTRAVENOUS | Status: DC
  Administered 2017-03-21: 40 mg via INTRAVENOUS
  Filled 2017-03-21: qty 40

## 2017-03-21 MED ORDER — TIOTROPIUM BROMIDE MONOHYDRATE 18 MCG IN CAPS
18.0000 ug | ORAL_CAPSULE | Freq: Every evening | RESPIRATORY_TRACT | Status: DC
  Administered 2017-03-21 – 2017-03-22 (×2): 18 ug via RESPIRATORY_TRACT
  Filled 2017-03-21: qty 5

## 2017-03-21 MED ORDER — ADULT MULTIVITAMIN W/MINERALS CH
1.0000 | ORAL_TABLET | Freq: Every day | ORAL | Status: DC
  Administered 2017-03-21 – 2017-03-23 (×3): 1 via ORAL
  Filled 2017-03-21 (×3): qty 1

## 2017-03-21 MED ORDER — VANCOMYCIN HCL IN DEXTROSE 1-5 GM/200ML-% IV SOLN
1000.0000 mg | INTRAVENOUS | Status: DC
  Administered 2017-03-22: 1000 mg via INTRAVENOUS
  Filled 2017-03-21 (×2): qty 200

## 2017-03-21 MED ORDER — SODIUM CHLORIDE 0.9% FLUSH
3.0000 mL | Freq: Two times a day (BID) | INTRAVENOUS | Status: DC
  Administered 2017-03-21: 3 mL via INTRAVENOUS

## 2017-03-21 MED ORDER — BOOST PO LIQD
1.0000 | Freq: Three times a day (TID) | ORAL | Status: DC
  Administered 2017-03-21 (×2): 237 mL via ORAL
  Administered 2017-03-21: 1 via ORAL

## 2017-03-21 MED ORDER — POLYETHYLENE GLYCOL 3350 17 G PO PACK: 17.0000 g | PACK | Freq: Every day | ORAL | Status: DC | PRN

## 2017-03-21 MED ORDER — MORPHINE SULFATE (PF) 2 MG/ML IV SOLN: 2.0000 mg | INTRAVENOUS | Status: DC | PRN

## 2017-03-21 MED ORDER — ONDANSETRON HCL 4 MG/2ML IJ SOLN: 4.0000 mg | Freq: Four times a day (QID) | INTRAMUSCULAR | Status: DC | PRN

## 2017-03-21 MED ORDER — MAGNESIUM HYDROXIDE 400 MG/5ML PO SUSP
30.0000 mL | Freq: Two times a day (BID) | ORAL | Status: DC | PRN
  Filled 2017-03-21: qty 30

## 2017-03-21 MED ORDER — ASPIRIN EC 81 MG PO TBEC
81.0000 mg | DELAYED_RELEASE_TABLET | Freq: Every day | ORAL | Status: DC
  Administered 2017-03-21: 81 mg via ORAL
  Filled 2017-03-21: qty 1

## 2017-03-21 MED ORDER — PHENAZOPYRIDINE HCL 200 MG PO TABS: 200.0000 mg | ORAL_TABLET | Freq: Three times a day (TID) | ORAL | Status: DC

## 2017-03-21 MED ORDER — SODIUM CHLORIDE 0.9 % IV SOLN: 250.0000 mL | INTRAVENOUS | Status: DC | PRN

## 2017-03-21 MED ORDER — SODIUM CHLORIDE 0.9% FLUSH
3.0000 mL | Freq: Two times a day (BID) | INTRAVENOUS | Status: DC
  Administered 2017-03-21 – 2017-03-22 (×5): 3 mL via INTRAVENOUS

## 2017-03-21 MED ORDER — SODIUM CHLORIDE 0.9 % IV BOLUS (SEPSIS)
500.0000 mL | Freq: Once | INTRAVENOUS | Status: AC
  Administered 2017-03-21: 500 mL via INTRAVENOUS

## 2017-03-21 MED ORDER — IPRATROPIUM-ALBUTEROL 0.5-2.5 (3) MG/3ML IN SOLN
3.0000 mL | RESPIRATORY_TRACT | Status: DC
  Administered 2017-03-21 – 2017-03-23 (×10): 3 mL via RESPIRATORY_TRACT
  Filled 2017-03-21 (×10): qty 3

## 2017-03-21 MED ORDER — LIDOCAINE HCL 2 % EX GEL
CUTANEOUS | Status: AC
Start: 2017-03-21 — End: 2017-03-21
  Filled 2017-03-21: qty 10

## 2017-03-21 MED ORDER — LIDOCAINE HCL 2 % EX GEL
CUTANEOUS | Status: DC | PRN
  Filled 2017-03-21 (×2): qty 5

## 2017-03-21 MED ORDER — CEFTRIAXONE SODIUM IN DEXTROSE 20 MG/ML IV SOLN
1.0000 g | INTRAVENOUS | Status: DC
  Filled 2017-03-21: qty 50

## 2017-03-21 MED ORDER — SODIUM CHLORIDE 0.9% FLUSH
3.0000 mL | INTRAVENOUS | Status: DC | PRN
  Administered 2017-03-21: 3 mL via INTRAVENOUS
  Filled 2017-03-21: qty 3

## 2017-03-21 MED ORDER — POLYETHYLENE GLYCOL 3350 17 G PO PACK
17.0000 g | PACK | Freq: Every day | ORAL | Status: DC
  Administered 2017-03-22: 17 g via ORAL
  Filled 2017-03-21 (×2): qty 1

## 2017-03-21 MED ORDER — LACTATED RINGERS IV SOLN
INTRAVENOUS | Status: DC
  Administered 2017-03-21: 04:00:00 via INTRAVENOUS

## 2017-03-21 MED ORDER — PHENAZOPYRIDINE HCL 100 MG PO TABS: 100.0000 mg | ORAL_TABLET | Freq: Three times a day (TID) | ORAL | Status: DC

## 2017-03-21 MED ORDER — METOPROLOL TARTRATE 25 MG PO TABS
25.0000 mg | ORAL_TABLET | Freq: Two times a day (BID) | ORAL | Status: DC
  Administered 2017-03-21 – 2017-03-23 (×5): 25 mg via ORAL
  Filled 2017-03-21 (×5): qty 1

## 2017-03-21 MED ORDER — DEXTROSE 5 % IV SOLN
1.0000 g | INTRAVENOUS | Status: DC
  Administered 2017-03-21 – 2017-03-22 (×2): 1 g via INTRAVENOUS
  Filled 2017-03-21 (×3): qty 10

## 2017-03-21 MED ORDER — ORAL CARE MOUTH RINSE
15.0000 mL | Freq: Two times a day (BID) | OROMUCOSAL | Status: DC
  Administered 2017-03-21 – 2017-03-22 (×4): 15 mL via OROMUCOSAL

## 2017-03-21 MED ORDER — PSYLLIUM 95 % PO PACK
1.0000 | PACK | Freq: Every day | ORAL | Status: DC
  Administered 2017-03-22: 1 via ORAL
  Filled 2017-03-21 (×3): qty 1

## 2017-03-21 MED ORDER — OXYBUTYNIN CHLORIDE ER 5 MG PO TB24
5.0000 mg | ORAL_TABLET | Freq: Every day | ORAL | Status: DC
  Administered 2017-03-21 – 2017-03-22 (×2): 5 mg via ORAL
  Filled 2017-03-21 (×2): qty 1

## 2017-03-21 MED ORDER — LIDOCAINE HCL 2 % EX GEL
1.0000 "application " | Freq: Once | CUTANEOUS | Status: AC
  Administered 2017-03-21: 1 via TOPICAL

## 2017-03-21 NOTE — Progress Notes (Signed)
Dr. Tobi BastosPyreddy notified of elevated Troponin, trending up; Acknowledged; Cardiology consult ordered, ".Marland Kitchen.although...they may not treat..."; Windy Carinaurner,Cedar Ditullio K, RN 4:49 AM 03/21/2017

## 2017-03-21 NOTE — Progress Notes (Signed)
Notified Dr. Sherryll BurgerShah that patient had small amount of bright red blood in stool per report from nurse tech during incontinence episode. Per Dr. Clelia CroftShaw "CBC will be rechecked and we will hold aspirin for now". Will continue to monitor patient.

## 2017-03-21 NOTE — Progress Notes (Signed)
Open blisters X3, noted on right THIGH r/t foley catheter tubing rubbing; Vaseline gauze placed in ED with gauze dressing to right, upper, anterior blister; foam applied to right lateral open blister; and open, dry blister right, upper, proximal thigh, open to air; Doaa Kendzierski K, RN4:18 AM 03/21/2017

## 2017-03-21 NOTE — Progress Notes (Addendum)
Initial Nutrition Assessment  DOCUMENTATION CODES:   Severe malnutrition in context of chronic illness  INTERVENTION:   Ensure Enlive po BID, each supplement provides 350 kcal and 20 grams of protein  MVI daily  Dysphagia 3 diet  NUTRITION DIAGNOSIS:   Severe Malnutrition related to catabolic illness(COPD, CHF, advanced age) as evidenced by severe fat depletion, severe muscle depletion.  GOAL:   Patient will meet greater than or equal to 90% of their needs  MONITOR:   PO intake, Supplement acceptance, Labs, Weight trends, Skin, I & O's  REASON FOR ASSESSMENT:   Malnutrition Screening Tool    ASSESSMENT:   [age] y.o. male with a known history of COPD, CAD and urinary retention with chronic Foley catheter who presents with pain and urinary incontinence   RD familiar with this pt from previous admits. Pt with chronic poor appetite and a gradual decline in weight over the past year. Pt reports he eats two meals a day and usually drinks one Boost per day at home. Pt eating 100% of meals on his previous admit to the hospital last month. Per chart, pt has lost 8lbs(7%) over the past 6 months which is not significant for the time frame. Pt prefers dysphagia diet where his meats will come chopped. RD will add Ensure and MVI.   Medications reviewed and include: aspirin, heparin, melatonin, protonix, psyllium, miralax, ceftriaxone, vancomycin   Labs reviewed: BUN 28(H)  Nutrition-Focused physical exam completed. Findings are severe fat and muscle depletions over entire body, and no edema.   Diet Order:  DIET DYS 3 Room service appropriate? Yes; Fluid consistency: Thin  EDUCATION NEEDS:   No education needs have been identified at this time  Skin:  Reviewed RN Assessment  Last BM:  12/11- type 4  Height:   Ht Readings from Last 1 Encounters:  03/21/17 5\' 6"  (1.676 m)    Weight:   Wt Readings from Last 1 Encounters:  03/21/17 120 lb 4.8 oz (54.6 kg)    Ideal Body  Weight:  64.5 kg  BMI:  Body mass index is 19.42 kg/m.  Estimated Nutritional Needs:   Kcal:  1600-1800kcal/day   Protein:  82-93g/day   Fluid:  >1.6L/day   Betsey Holidayasey Kyliyah Stirn MS, RD, LDN Pager #[phone] After Hours Pager: [phone]

## 2017-03-21 NOTE — Progress Notes (Signed)
BP elevated this am; AM Lopressor given at this time. Will monitor BP; Windy Carinaurner,Nyeisha Goodall K, RN 6:26 AM 03/21/2017

## 2017-03-21 NOTE — H&P (Signed)
[employer] [facility]   PATIENT NAME: Daniel BussingGeorge Dickson    MR#:  [id number]  DATE OF BIRTH:  02/01/1913  DATE OF ADMISSION:  03/21/2017  PRIMARY CARE PHYSICIAN: Dortha KernBliss, Laura K, MD   REQUESTING/REFERRING PHYSICIAN:   CHIEF COMPLAINT:   Chief Complaint  Patient presents with  . Rectal Pain    HISTORY OF PRESENT ILLNESS: Daniel BussingGeorge Dickson  is a [age] y.o. male with a known history per below, recently discharged for GI bleeding thought to be due to diverticular disease 2 weeks ago, chronic indwelling Foley catheter, chronic respiratory failure on 2 L continuous, presented to the emergency room with penile pain as well as rectal pain which he has had for numerous months, workup in the emergency room noted for troponin of 0.17 with abnormal urinalysis concerning for UTI, patient evaluated in the emergency room S, resting comfortably in bed, noted gross hematuria and Foley catheter and bag, nonthrombosed external hemorrhoid noted, patient is now being admitted for acute hematuria, probable complicated UTI, poor historian, and elevated troponin.  PAST MEDICAL HISTORY:   Past Medical History:  Diagnosis Date  . Anxiety   . COPD (chronic obstructive pulmonary disease) (HCC)   . Coronary artery disease    a. 2012 s/p MI/cardiac arrest--> PCI mRCA.  Marland Kitchen. Hypertension   . Hypertensive heart disease   . Mixed hyperlipidemia   . PTSD (post-traumatic stress disorder)    with headaches  . Systolic CHF (HCC)     PAST SURGICAL HISTORY:  Past Surgical History:  Procedure Laterality Date  . CATARACT EXTRACTION    . CHOLECYSTECTOMY    . CORONARY ANGIOPLASTY     with stent; [facility]  . HAND SURGERY    . HEMORRHOID SURGERY    . HIP SURGERY    . VASCULAR SURGERY     stent R femoral artery    SOCIAL HISTORY:  Social History   Tobacco Use  . Smoking status: Former Smoker    Packs/day: 3.00    Years: 40.00    Pack years: 120.00    Types: Cigarettes  . Smokeless tobacco: Never  Used  . Tobacco comment: quit 62 years  Substance Use Topics  . Alcohol use: Yes    Alcohol/week: 1.8 oz    Types: 3 Glasses of wine per week    Comment: glass of wine at night    FAMILY HISTORY:  Family History  Problem Relation Age of Onset  . COPD Father   . Bladder Cancer Neg Hx   . Kidney cancer Neg Hx   . Prostate cancer Neg Hx     DRUG ALLERGIES:  Allergies  Allergen Reactions  . Iodine (Kelp) [Iodine] Nausea And Vomiting    REVIEW OF SYSTEMS:   CONSTITUTIONAL: No fever, chronic fatigue/weakness.  EYES: No blurred or double vision.  EARS, NOSE, AND THROAT: No tinnitus or ear pain.  RESPIRATORY: No cough, shortness of breath, wheezing or hemoptysis.  CARDIOVASCULAR: No chest pain, orthopnea, edema.  GASTROINTESTINAL: No nausea, vomiting, diarrhea or abdominal pain.  GENITOURINARY: No dysuria, hematuria.  Chronic indwelling Foley catheter ENDOCRINE: No polyuria, nocturia,  HEMATOLOGY: No anemia, easy bruising or bleeding SKIN: No rash, none thrombosed external hemorrhoid MUSCULOSKELETAL: No joint pain or arthritis.   NEUROLOGIC: No tingling, numbness, weakness.  PSYCHIATRY: No anxiety or depression.   MEDICATIONS AT HOME:  Prior to Admission medications   Medication Sig Start Date End Date Taking? Authorizing Provider  acetaminophen (TYLENOL) 500 MG tablet Take 500 mg by  mouth daily as needed for mild pain or moderate pain.     [provider]  albuterol (PROAIR HFA) 108 (90 BASE) MCG/ACT inhaler Inhale 2 puffs into the lungs every 6 (six) hours as needed. For wheezing.    [provider]  albuterol (PROVENTIL) (2.5 MG/3ML) 0.083% nebulizer solution Take 3 mLs (2.5 mg total) by nebulization every 4 (four) hours as needed for wheezing or shortness of breath. 09/28/16   Milagros Loll, MD  feeding supplement (BOOST / RESOURCE BREEZE) LIQD Take 1 Container by mouth 3 (three) times daily between meals. 11/02/15   Katharina Caper, MD  furosemide (LASIX)  40 MG tablet Take 0.5 tablets (20 mg total) by mouth daily. 01/04/17   Houston Siren, MD  magnesium hydroxide (MILK OF MAGNESIA) 400 MG/5ML suspension Take 30 mLs by mouth 2 (two) times daily as needed for mild constipation or moderate constipation. 11/02/15   Katharina Caper, MD  Melatonin 5 MG CAPS Take 10 mg by mouth at bedtime.    [provider]  metoprolol tartrate (LOPRESSOR) 25 MG tablet Take 1 tablet (25 mg total) by mouth 2 (two) times daily. 03/05/17   Adrian Saran, MD  nitroGLYCERIN (NITROSTAT) 0.4 MG SL tablet Place 0.4 mg under the tongue every 5 (five) minutes as needed for chest pain. Reported on 10/19/2015    [provider]  oxybutynin (DITROPAN-XL) 5 MG 24 hr tablet Take 5 mg by mouth daily as needed.    [provider]  polyethylene glycol (MIRALAX / GLYCOLAX) packet Take 17 g by mouth daily. 03/05/17   Adrian Saran, MD  psyllium (HYDROCIL/METAMUCIL) 95 % PACK Take 1 packet by mouth daily. 03/05/17   Adrian Saran, MD  tiotropium (SPIRIVA) 18 MCG inhalation capsule Place 1 capsule (18 mcg total) into inhaler and inhale every evening. 10/09/15 01/14/18  Adrian Saran, MD      PHYSICAL EXAMINATION:   VITAL SIGNS: Pulse (!) 108, temperature 97.7 F (36.5 C), temperature source Oral, resp. rate 14, height 5\' 6"  (1.676 m), weight 56.2 kg (124 lb), SpO2 98 %.  GENERAL:  83 y.o.-year-old patient lying in the bed with no acute distress.  Frail-appearing EYES: Pupils equal, round, reactive to light and accommodation. No scleral icterus. Extraocular muscles intact.  HEENT: Head atraumatic, normocephalic. Oropharynx and nasopharynx clear.  NECK:  Supple, no jugular venous distention. No thyroid enlargement, no tenderness.  LUNGS: Normal breath sounds bilaterally, no wheezing, rales,rhonchi or crepitation. No use of accessory muscles of respiration.  CARDIOVASCULAR: S1, S2 normal. No murmurs, rubs, or gallops.  ABDOMEN: Soft, nontender, nondistended. Bowel sounds  present. No organomegaly or mass.  Chronic indwelling Foley EXTREMITIES: No pedal edema, cyanosis, or clubbing.  NEUROLOGIC: Cranial nerves II through XII are intact. Muscle strength 5/5 in all extremities. Sensation intact. Gait not checked.  PSYCHIATRIC: The patient is alert and oriented x 3.  SKIN: No obvious rash/ulcer.  External nonthrombosed hemorrhoid  LABORATORY PANEL:   CBC Recent Labs  Lab 03/21/17 0012  WBC 7.6  HGB 10.9*  HCT 32.8*  PLT 175  MCV 95.9  MCH 31.8  MCHC 33.2  RDW 15.2*   ------------------------------------------------------------------------------------------------------------------  Chemistries  Recent Labs  Lab 03/21/17 0012  NA 138  K 3.8  CL 104  CO2 24  GLUCOSE 104*  BUN 28*  CREATININE 1.14  CALCIUM 9.2   ------------------------------------------------------------------------------------------------------------------ estimated creatinine clearance is 26 mL/min (by C-G formula based on SCr of 1.14 mg/dL). ------------------------------------------------------------------------------------------------------------------ No results for input(s): TSH, T4TOTAL, T3FREE, THYROIDAB in  the last 72 hours.  Invalid input(s): FREET3   Coagulation profile No results for input(s): INR, PROTIME in the last 168 hours. ------------------------------------------------------------------------------------------------------------------- No results for input(s): DDIMER in the last 72 hours. -------------------------------------------------------------------------------------------------------------------  Cardiac Enzymes Recent Labs  Lab 03/21/17 0012  TROPONINI 0.17*   ------------------------------------------------------------------------------------------------------------------ Invalid input(s): POCBNP  ---------------------------------------------------------------------------------------------------------------  Urinalysis    Component  Value Date/Time   COLORURINE YELLOW (A) 03/21/2017 0012   APPEARANCEUR HAZY (A) 03/21/2017 0012   LABSPEC 1.020 03/21/2017 0012   PHURINE 6.0 03/21/2017 0012   GLUCOSEU NEGATIVE 03/21/2017 0012   HGBUR SMALL (A) 03/21/2017 0012   BILIRUBINUR NEGATIVE 03/21/2017 0012   KETONESUR NEGATIVE 03/21/2017 0012   PROTEINUR 100 (A) 03/21/2017 0012   NITRITE POSITIVE (A) 03/21/2017 0012   LEUKOCYTESUR MODERATE (A) 03/21/2017 0012     RADIOLOGY: No results found.  EKG: Orders placed or performed during the hospital encounter of 03/21/17  . EKG 12-Lead  . EKG 12-Lead  . EKG 12-Lead  . EKG 12-Lead    IMPRESSION AND PLAN: 1 acute probable complicated UTI  Noted chronic indwelling Foley with gross hematuria  Admit to regular nursing floor bed, consult urology for expert opinion given penile pain as well, unknown when Foley was last changed-patient does not know/poor historian, empiric Rocephin, follow up on cultures   2 acute hematuria Most likely secondary to UTI and chronic indwelling Foley Strict I&O monitoring and all the plans as stated above  3 acute on chronic penile pain Most likely secondary to chronic indwelling Foley catheter Plan of care as stated above  4 acute on rectal pain Etiology unknown Suspected may be related to hemorrhoidal pain, nonthrombosed external hemorrhoid noted Will give trial of Anusol twice daily  5 acute elevated troponins Patient without chest pain/shortness of breath Continue BB, nitrates prn, aspirin, supplemental oxygen as needed, IV morphine as needed breakthrough pain, rule out acute urinary syndrome with cardiac enzymes x3 sets, continue close medical monitoring  Full code Condition stable Prognosis poor given advanced age DVT prophylaxis with heparin subcu Disposition home in 1-3 days barring any complications   All the records are reviewed and case discussed with ED provider. Management plans discussed with the patient, family and they  are in agreement.  CODE STATUS: Code Status History    Date Active Date Inactive Code Status Order ID Comments User Context   03/04/2017 10:38 03/05/2017 17:53 Full Code [id number]  Adrian Saran, MD Inpatient   03/04/2017 09:59 03/04/2017 10:38 DNR [id number]  Adrian Saran, MD Inpatient   01/20/2017 04:46 01/21/2017 16:22 Full Code [id number]  Tonye Royalty, DO Inpatient   01/02/2017 19:37 01/03/2017 17:59 DNR [id number]  Milagros Loll, MD ED   11/12/2016 09:32 11/13/2016 15:37 Full Code [id number]  Gasper Lloyd, RN Inpatient   11/11/2016 11:23 11/12/2016 09:32 DNR [id number]  Enedina Finner, MD Inpatient   10/18/2016 20:36 10/20/2016 19:52 DNR [id number]  Milagros Loll, MD ED   09/27/2016 15:00 09/28/2016 17:19 DNR [id number]  Altamese Dilling, MD Inpatient   09/24/2016 08:52 09/27/2016 15:00 Full Code [id number]  Enid Baas, MD Inpatient   08/30/2016 05:16 09/02/2016 16:04 Full Code [id number]  Arnaldo Natal, MD Inpatient   02/18/2016 08:27 02/22/2016 18:50 Full Code [id number]  Enedina Finner, MD Inpatient   10/29/2015 21:19 11/02/2015 14:22 Full Code [id number]  Enid Baas, MD Inpatient   10/07/2015 07:32 10/09/2015 16:29 Full Code [id number]  Ihor Austin, MD Inpatient   06/07/2015 00:04 06/10/2015 16:31 Full Code [id number]  Oralia Manis, MD Inpatient   01/11/2015  13:54 01/12/2015 13:58 Full Code [id number]  Alford HighlandWieting, Richard, MD ED       TOTAL TIME TAKING CARE OF THIS PATIENT: 40 minutes.    Evelena AsaMontell D Chace Bisch M.D on 03/21/2017   Between 7am to 6pm - Pager - [phone]  After 6pm go to [email or web address] - password [email or web address]  [facility] [place] [facility]  Office  [phone]  CC: Primary care physician; Dortha KernBliss, Laura K, MD   Note: This dictation was prepared with Dragon dictation along with smaller phrase technology. Any transcriptional errors that result from this process are unintentional.

## 2017-03-21 NOTE — ED Provider Notes (Signed)
[facility] Emergency Department Provider Note   ____________________________________________   First MD Initiated Contact with Patient 03/21/17 0013     (approximate)  I have reviewed the triage vital signs and the nursing notes.   HISTORY  Chief Complaint Rectal Pain    HPI Daniel Dickson is a 83 y.o. male who comes into the hospital today with some rectum and penis pain.  He reports that this started about 6 months ago but states it is more pain than normal.  He reports that he started taking a new pill for it but he is unsure what it is.  He reports that he started this medicine a week ago but the pain became very severe this evening.  The patient also states that he has been very weak and weary.  He denies any nausea or vomiting and states he has had some intermittent chest pain shortness of breath but nothing today.  The patient decided to come into the hospital today for evaluation.  He does have an indwelling Foley catheter.    Past Medical History:  Diagnosis Date  . Anxiety   . COPD (chronic obstructive pulmonary disease) (HCC)   . Coronary artery disease    a. 2012 s/p MI/cardiac arrest--> PCI mRCA.  Marland Kitchen. Hypertension   . Hypertensive heart disease   . Mixed hyperlipidemia   . PTSD (post-traumatic stress disorder)    with headaches  . Systolic CHF [facility](HCC)     Patient Active Problem List   Diagnosis Date Noted  . Elevated troponin 03/21/2017  . Hematuria 03/21/2017  . GIB (gastrointestinal bleeding) 03/04/2017  . Shortness of breath 01/20/2017  . CHF (congestive heart failure) (HCC) 01/02/2017  . Acute on chronic respiratory failure (HCC) 11/11/2016  . Open wound of lower limb with tendon involvement, right, subsequent encounter   . Cellulitis of right leg 10/18/2016  . DNR (do not resuscitate) discussion   . Palliative care by specialist   . Adult failure to thrive   . Gross hematuria   . Acute respiratory failure (HCC) 02/18/2016    . Urinary retention 11/10/2015  . Hyperkalemia 11/02/2015  . Hypotension 11/02/2015  . Dysphagia, pharyngoesophageal phase 11/02/2015  . Esophageal spasm 11/02/2015  . Constipation 11/02/2015  . SIADH (syndrome of inappropriate ADH production) (HCC) 11/02/2015  . Urinary retention due to benign prostatic hyperplasia 11/02/2015  . Chronic indwelling Foley catheter 11/02/2015  . Bacteriuria with pyuria 11/02/2015  . Protein-calorie malnutrition, severe 10/30/2015  . ARF (acute renal failure) (HCC) 10/29/2015  . Dyspnea 10/07/2015  . Hyponatremia 06/06/2015  . Coronary artery disease   . Hypertensive heart disease   . Mixed hyperlipidemia   . COPD (chronic obstructive pulmonary disease) (HCC)   . COPD exacerbation (HCC) 01/11/2015  . Moderate COPD (chronic obstructive pulmonary disease) (HCC) 12/08/2014  . Fall 09/03/2014  . Hip fracture (HCC) 09/03/2014  . PTSD (post-traumatic stress disorder) 09/03/2014  . Bilateral leg edema 09/03/2014  . SOB (shortness of breath) 10/31/2012  . Essential hypertension 10/31/2012  . CAD (coronary artery disease) 10/31/2012  . Hyperlipidemia 10/31/2012  . Tachycardia 10/31/2012    Past Surgical History:  Procedure Laterality Date  . CATARACT EXTRACTION    . CHOLECYSTECTOMY    . CORONARY ANGIOPLASTY     with stent; [facility]  . HAND SURGERY    . HEMORRHOID SURGERY    . HIP SURGERY    . VASCULAR SURGERY     stent R femoral artery    Prior to  Admission medications   Medication Sig Start Date End Date Taking? Authorizing Provider  acetaminophen (TYLENOL) 500 MG tablet Take 500 mg by mouth daily as needed for mild pain or moderate pain.     [provider]  albuterol (PROAIR HFA) 108 (90 BASE) MCG/ACT inhaler Inhale 2 puffs into the lungs every 6 (six) hours as needed. For wheezing.    [provider]  albuterol (PROVENTIL) (2.5 MG/3ML) 0.083% nebulizer solution Take 3 mLs (2.5 mg total) by nebulization every 4 (four) hours as  needed for wheezing or shortness of breath. 09/28/16   Milagros Loll, MD  feeding supplement (BOOST / RESOURCE BREEZE) LIQD Take 1 Container by mouth 3 (three) times daily between meals. 11/02/15   Katharina Caper, MD  furosemide (LASIX) 40 MG tablet Take 0.5 tablets (20 mg total) by mouth daily. 01/04/17   Houston Siren, MD  magnesium hydroxide (MILK OF MAGNESIA) 400 MG/5ML suspension Take 30 mLs by mouth 2 (two) times daily as needed for mild constipation or moderate constipation. 11/02/15   Katharina Caper, MD  Melatonin 5 MG CAPS Take 10 mg by mouth at bedtime.    [provider]  metoprolol tartrate (LOPRESSOR) 25 MG tablet Take 1 tablet (25 mg total) by mouth 2 (two) times daily. 03/05/17   Adrian Saran, MD  nitroGLYCERIN (NITROSTAT) 0.4 MG SL tablet Place 0.4 mg under the tongue every 5 (five) minutes as needed for chest pain. Reported on 10/19/2015    [provider]  oxybutynin (DITROPAN-XL) 5 MG 24 hr tablet Take 5 mg by mouth daily as needed.    [provider]  polyethylene glycol (MIRALAX / GLYCOLAX) packet Take 17 g by mouth daily. 03/05/17   Adrian Saran, MD  psyllium (HYDROCIL/METAMUCIL) 95 % PACK Take 1 packet by mouth daily. 03/05/17   Adrian Saran, MD  tiotropium (SPIRIVA) 18 MCG inhalation capsule Place 1 capsule (18 mcg total) into inhaler and inhale every evening. 10/09/15 01/14/18  Adrian Saran, MD    Allergies Iodine (kelp) [iodine]  Family History  Problem Relation Age of Onset  . COPD Father   . Bladder Cancer Neg Hx   . Kidney cancer Neg Hx   . Prostate cancer Neg Hx     Social History Social History   Tobacco Use  . Smoking status: Former Smoker    Packs/day: 3.00    Years: 40.00    Pack years: 120.00    Types: Cigarettes  . Smokeless tobacco: Never Used  . Tobacco comment: quit 62 years  Substance Use Topics  . Alcohol use: Yes    Alcohol/week: 1.8 oz    Types: 3 Glasses of wine per week    Comment: glass of wine at night  . Drug  use: No    Review of Systems  Constitutional: No fever/chills Eyes: No visual changes. ENT: No sore throat. Cardiovascular: Denies chest pain. Respiratory: Denies shortness of breath. Gastrointestinal: No abdominal pain.  No nausea, no vomiting.  No diarrhea.  No constipation. Genitourinary: Rectal pain and penis pain Musculoskeletal: Negative for back pain. Skin: Negative for rash. Neurological: Generalized weakness   ____________________________________________   PHYSICAL EXAM:  VITAL SIGNS: ED Triage Vitals  Enc Vitals Group     BP --      Pulse Rate 03/21/17 0009 (!) 108     Resp 03/21/17 0009 14     Temp 03/21/17 0009 97.7 F (36.5 C)     Temp Source 03/21/17 0009 Oral  SpO2 03/21/17 0009 97 %     Weight 03/21/17 0013 124 lb (56.2 kg)     Height 03/21/17 0013 5\' 6"  (1.676 m)     Head Circumference --      Peak Flow --      Pain Score 03/21/17 0009 9     Pain Loc --      Pain Edu? --      Excl. in GC? --     Constitutional: Alert and oriented. Well appearing and in mild distress. Eyes: Conjunctivae are normal. PERRL. EOMI. Head: Atraumatic. Nose: No congestion/rhinnorhea. Mouth/Throat: Mucous membranes are moist.  Oropharynx non-erythematous. Cardiovascular: Normal rate, regular rhythm. Grossly normal heart sounds.  Good peripheral circulation. Respiratory: Normal respiratory effort.  No retractions. Lungs CTAB. Gastrointestinal: Soft and nontender. No distention.  Positive bowel sounds Genitourinary: Only catheter in place, some whitish discharge once foreskin is retracted with some mild erythema no purulent drainage.  No ulcers noted.  Hemorrhoid noted to rectum with some pain to palpation Musculoskeletal: No lower extremity tenderness nor edema.   Neurologic:  Normal speech and language.  Skin:  Skin is warm, dry ulcer to right thigh with no significant surrounding erythema, ulcer to right lateral thigh 3 x 4 cm and 4 x 1-1/2 cm respectively.    Psychiatric: Mood and affect are normal.   ____________________________________________   LABS (all labs ordered are listed, but only abnormal results are displayed)  Labs Reviewed  CBC - Abnormal; Notable for the following components:      Result Value   RBC 3.42 (*)    Hemoglobin 10.9 (*)    HCT 32.8 (*)    RDW 15.2 (*)    All other components within normal limits  BASIC METABOLIC PANEL - Abnormal; Notable for the following components:   Glucose, Bld 104 (*)    BUN 28 (*)    GFR calc non Af Amer 50 (*)    GFR calc Af Amer 58 (*)    All other components within normal limits  TROPONIN I - Abnormal; Notable for the following components:   Troponin I 0.17 (*)    All other components within normal limits  URINALYSIS, COMPLETE (UACMP) WITH MICROSCOPIC - Abnormal; Notable for the following components:   Color, Urine YELLOW (*)    APPearance HAZY (*)    Hgb urine dipstick SMALL (*)    Protein, ur 100 (*)    Nitrite POSITIVE (*)    Leukocytes, UA MODERATE (*)    Bacteria, UA FEW (*)    Squamous Epithelial / LPF 0-5 (*)    All other components within normal limits  CULTURE, BLOOD (ROUTINE X 2)  CULTURE, BLOOD (ROUTINE X 2)  URINE CULTURE  URINE CULTURE  CBC WITH DIFFERENTIAL/PLATELET   ____________________________________________  EKG  ED ECG REPORT I, Rebecka ApleyWebster,  Allison P, the attending physician, personally viewed and interpreted this ECG.   Date: 03/21/2017  EKG Time: 0019  Rate: 105  Rhythm: sinus tachycardia  Axis: left axis deviation  Intervals:incomplete RBBB  ST&T Change: Flipped T waves in leads I, aVL, V2, V3, V4   ____________________________________________  RADIOLOGY  No results found.  ____________________________________________   PROCEDURES  Procedure(s) performed: None  Procedures  Critical Care performed: No  ____________________________________________   INITIAL IMPRESSION / ASSESSMENT AND PLAN / ED COURSE  As part of my  medical decision making, I reviewed the following data within the electronic MEDICAL RECORD NUMBER Notes from prior ED visits and [place] Controlled Substance Database  This is 83 year old male who comes into the hospital today with some rectal pain and penile pain.  The patient has an indwelling Foley catheter.  He also complained about some weakness.  I did order some blood work to include a CBC BMP troponin and urinalysis.  The patient does have an indwelling catheter and there is always a possibility that he may have a urinary tract infection.  I also feel that he has a hemorrhoid which may be causing his pain although he was admitted recently with a rectal bleed.  Once I received the blood work will reassess the patient.  The patient's troponin returned at 0.17 which is new from previous.  His urine also showed moderate leukocytes positive nitrites too numerous to count [place] blood cells and a few bacteria.  I will give the patient a dose of ceftriaxone as well as some lidocaine jelly to his rectum.  Given the patient's elevated troponin he will be admitted to the hospitalist service.  The patient also appears to have a catheter associated UTI.      ____________________________________________   FINAL CLINICAL IMPRESSION(S) / ED DIAGNOSES  Final diagnoses:  Weakness  Elevated troponin  Other hemorrhoids  Urinary tract infection associated with indwelling urethral catheter, initial encounter [facility])     ED Discharge Orders    None       Note:  This document was prepared using Dragon voice recognition software and may include unintentional dictation errors.    Rebecka Apley, MD 03/21/17 [phone]

## 2017-03-21 NOTE — Progress Notes (Signed)
Pharmacy Antibiotic Note  Daniel Dickson is a [age] y.o. male admitted on 03/21/2017 with UTI.  Pharmacy has been consulted for vancomycin dosing.  Plan: DW 56kg  Vd 39L kei 0.026 hr-1  t1/2 27 hours Vancomycin 1 gram q 36 hours ordered with stacked dosing. Level before 4th dose. Goal trough 15-20.  Height: 5\' 6"  (167.6 cm) Weight: 120 lb 4.8 oz (54.6 kg) IBW/kg (Calculated) : 63.8  Temp (24hrs), Avg:97.6 F (36.4 C), Min:97.5 F (36.4 C), Max:97.7 F (36.5 C)  Recent Labs  Lab 03/21/17 0012 03/21/17 0325  WBC 7.6 7.5  CREATININE 1.14  --     Estimated Creatinine Clearance: 25.3 mL/min (by C-G formula based on SCr of 1.14 mg/dL).    Allergies  Allergen Reactions  . Iodine (Kelp) [Iodine] Nausea And Vomiting    Antimicrobials this admission: Vancomycin, ceftriaxone 12/11  >>    >>   Dose adjustments this admission:   Microbiology results: 12/11 BCx: pending 12/11 UCx: pending       12/11 UA: LE(+) NO2(+) WBC TNTC  Thank you for allowing pharmacy to be a part of this patient's care.  Kashmir Lysaght S 03/21/2017 4:46 AM

## 2017-03-21 NOTE — Progress Notes (Signed)
Dr. Tobi BastosPyreddy notified of patient request for Lidocaine ointment for penile pain; acknowledged; n.tdew order written. Windy Carinaurner,November Sypher K, RN 3:58 AM ,03/21/2017

## 2017-03-21 NOTE — Consult Note (Signed)
Cardiology Consult    Patient ID: Daniel Dickson MRN: [id number], DOB/AGE: 81/05/1912   Admit date: 03/21/2017 Date of Consult: 03/21/2017  Primary Physician: Daniel KernBliss, Laura K, MD Primary Cardiologist: Daniel Nordmannimothy Gollan, MD Requesting Provider: V. Sherryll BurgerShah, MD  Patient Profile    Daniel Dickson is a [age] y.o. male with a history of CAD, ischemic cardiomyopathy, hypertension, hyperlipidemia, BPH with chronic urinary retention and intermittent UTIs and hematuria, COPD, hyperlipidemia, and recent admission for rectal bleeding - felt to be diverticular - who is being seen today for the evaluation of troponin elevation in the setting of penile and rectal pain at the request of Dr. Sherryll Dickson.  Past Medical History   Past Medical History:  Diagnosis Date  . Anxiety   . BPH (benign prostatic hyperplasia)    a. complicated by urinary retention req chronic foley.  . Cellulitis    a. 10/2016 - Right leg.  . Chronic systolic CHF (congestive heart failure) (HCC)    a. 02/2016 Echo: EF 40-45%, no rwma, mildly dil LA.  Marland Kitchen. Chronic Urinary retention    a. Chronic indwelling foley.  Marland Kitchen. COPD (chronic obstructive pulmonary disease) (HCC)    a. On chronic supplemental O2 via [place].  Marland Kitchen. Coronary artery disease    a. 03/2011 Inflat STEMI/PCI ([facility]): LM nl, LAD 50p, LCX 7059m, RI nl, RCA 100p (2.75x22 MDT Integrity BMS)-->post procedure course complicated by RFV DVT req coumadin and then R SFA PSA & hematoma req surgical evacuation.  . DVT (deep venous thrombosis) (HCC)    a. 03/2011 R Femoral Vein DVT following Cath/PCI.  Marland Kitchen. Hematuria   . Hemorrhoids   . History of GI diverticular bleed    a. 02/2017 - rectal bleeding felt to be diverticular-->colonoscopy deferrred.  . Hypertensive heart disease   . Ischemic cardiomyopathy    a. 02/2016 Echo: EF 40-45%.  . Mixed hyperlipidemia   . PTSD (post-traumatic stress disorder)    with headaches  . Right SFA Pseudoaneurysm (HCC)    a. 03/2011 following  Cath/PCI-->complicated by hematoma req surgical evacuation.    Past Surgical History:  Procedure Laterality Date  . CATARACT EXTRACTION    . CHOLECYSTECTOMY    . CORONARY ANGIOPLASTY     with stent; [facility]  . HAND SURGERY    . HEMORRHOID SURGERY    . HIP SURGERY    . VASCULAR SURGERY     stent R femoral artery     Allergies  Allergies  Allergen Reactions  . Iodine (Kelp) [Iodine] Nausea And Vomiting    History of Present Illness    83 year old male with the above complex past medical history including coronary artery disease status post inferolateral ST segment elevation myocardial infarction in December 2012 requiring PCI and bare-metal stenting of the occluded right coronary artery.  Postprocedure course was complicated by right femoral vein DVT requiring Coumadin.  This was further complicated by right groin hematoma and pseudoaneurysm eventually requiring surgical evacuation.  Other history includes hypertension, hyperlipidemia, ischemic cardiomyopathy, HFrEF, BPH with chronic urinary retention and indwelling Foley catheter with recurrent urinary tract infections, COPD on home supplemental oxygen with multiple admissions for respiratory failure.  Daniel Dickson has been admitted monthly dating back to May of this year.  He had hematuria and urinary retention in May, COPD flare in June, right lower extremity cellulitis in July, recurrent COPD exacerbation in August, respiratory failure with COPD and CHF in September, AECOPD in October, and was most recently just discharged November 25 following admission for rectal  bleeding, which was felt to be diverticular in nature.  During that admission, hemoglobin remained stable and colonoscopy was deferred.  Daniel Dickson presented to the [facility] emergency department on December 10 secondary to severe rectal and penile pain with hematuria.  He was not having any chest pain or dyspnea but says that he does sometimes experience chest pain  immediately after  using albuterol, lasting 5 or 10 minutes, and resolving spontaneously.  This might occur multiple times per week but not every time he uses albuterol.  He denies experiencing any chest pain on December 10.  Regardless, upon arrival to the emergency department, troponins were cycled and were found to be elevated at 0.17  0.25  0.24.  Patient was admitted for further evaluation and placed on antibiotics for urinary tract infection.  We have been asked to evaluate.  He currently denies any chest pain or dyspnea.  Inpatient Medications    . aspirin EC  81 mg Oral Daily  . feeding supplement (ENSURE ENLIVE)  237 mL Oral BID BM  . heparin  5,000 Units Subcutaneous Q8H  . hydrocortisone   Topical BID  . lactose free nutrition  1 Container Oral TID BM  . mouth rinse  15 mL Mouth Rinse BID  . Melatonin  10 mg Oral QHS  . metoprolol tartrate  25 mg Oral BID  . multivitamin with minerals  1 tablet Oral Daily  . oxybutynin  5 mg Oral QHS  . pantoprazole (PROTONIX) IV  40 mg Intravenous Q24H  . polyethylene glycol  17 g Oral Daily  . psyllium  1 packet Oral Daily  . sodium chloride flush  3 mL Intravenous Q12H  . tiotropium  18 mcg Inhalation QPM    Family History    Family History  Problem Relation Age of Onset  . COPD Father   . Bladder Cancer Neg Hx   . Kidney cancer Neg Hx   . Prostate cancer Neg Hx     Social History    Social History   Socioeconomic History  . Marital status: Widowed    Spouse name: Not on file  . Number of children: Not on file  . Years of education: Not on file  . Highest education level: Not on file  Social Needs  . Financial resource strain: Not on file  . Food insecurity - worry: Not on file  . Food insecurity - inability: Not on file  . Transportation needs - medical: Not on file  . Transportation needs - non-medical: Not on file  Occupational History  . Occupation: retired  Tobacco Use  . Smoking status: Former Smoker    Packs/day: 3.00    Years:  40.00    Pack years: 120.00    Types: Cigarettes  . Smokeless tobacco: Never Used  . Tobacco comment: quit 62 years  Substance and Sexual Activity  . Alcohol use: Yes    Alcohol/week: 1.8 oz    Types: 3 Glasses of wine per week    Comment: glass of wine at night  . Drug use: No  . Sexual activity: Not Currently  Other Topics Concern  . Not on file  Social History Narrative   Lives in [place] with his 42 y/o son, who has Downs Syndrome.  Pts dtr is a Engineer, civil (consulting) and lives next door.  Pt is retired Tour manager.     Review of Systems    General:  No chills, fever, night sweats or weight changes.  Cardiovascular:  +++ Intermittent chest pain that seems to occur exclusively after using albuterol.  No dyspnea on exertion, edema, orthopnea, palpitations, paroxysmal nocturnal dyspnea. Dermatological: No rash, lesions/masses Respiratory: No cough, dyspnea Urologic: +++ hematuria. Chronic foley. Abdominal:   No nausea, vomiting, diarrhea, bright red blood per rectum, melena, or hematemesis Pelvic:+++ Rectal and penile pain on admission.  +++ Hematuria. Neurologic:  No visual changes, wkns, changes in mental status. All other systems reviewed and are otherwise negative except as noted above.  Physical Exam    Blood pressure (!) 111/51, pulse 81, temperature (!) 97.4 F (36.3 C), temperature source Oral, resp. rate (!) 24, height 5\' 6"  (1.676 m), weight 120 lb 4.8 oz (54.6 kg), SpO2 100 %.  General: Pleasant, NAD Psych: Normal affect. Neuro: Alert and oriented X 3. Moves all extremities spontaneously. HEENT: Normal  Neck: Supple without bruits or JVD. Lungs:  Resp regular and unlabored, diminished breath sounds bilateral bases. Heart: RRR, distant, no s3, s4, or murmurs. Abdomen: Soft, non-tender, non-distended, BS + x 4.  Extremities: No clubbing, cyanosis or edema. DP/PT/Radials 1+ and equal bilaterally.  Labs     Recent Labs    03/21/17 0012 03/21/17 0325  03/21/17 0904  TROPONINI 0.17* 0.25* 0.24*   Lab Results  Component Value Date   WBC 7.5 03/21/2017   HGB 11.1 (L) 03/21/2017   HCT 32.6 (L) 03/21/2017   MCV 96.3 03/21/2017   PLT 167 03/21/2017    Recent Labs  Lab 03/21/17 0012  NA 138  Dickson 3.8  CL 104  CO2 24  BUN 28*  CREATININE 1.14  CALCIUM 9.2  GLUCOSE 104*     Radiology Studies    No results found.  ECG & Cardiac Imaging    Sinus tachycardia, 105, leftward axis, left atrial enlargement, right bundle branch block, PVC.  No acute changes.  Assessment & Plan    1.  Elevated troponin/coronary artery disease: Patient with prior history of CAD status post inferolateral ST segment elevation myocardial infarction requiring a bare metal stenting of the right coronary artery in December 2012.  He had been doing well from a cardiac standpoint since then.  He does note occasional episodes of chest discomfort after using albuterol therapy.  He has not had any exertional symptoms.  He presented with rectal and penile pain on December 10 troponins were evaluated and returned mildly positive, peaking at 0.25.  He currently denies any chest pain.  ECG nonacute.  Given advanced age, comorbidities including recent GI bleed, and current absence of symptoms, would not pursue additional ischemic evaluation at this time.Continue aspirin and beta-blocker therapy.  2.  Rectal pain: Steroid cream per internal medicine.  3.  Urinary tract infection/penile pain/hematuria: Antibiotics per internal medicine.  He has chronic indwelling Foley catheter.  4.  Chronic obstructive pulmonary disease: Stable.  As he seems to have some chest discomfort with albuterol therapy, consider switching to an alternate.  5.  Recent diverticular bleed: Hemoglobin and hematocrit stable.  6.  Essential hypertension: Stable.  Signed, Nicolasa Duckinghristopher Jevonte Clanton, NP 03/21/2017, 1:28 PM  For questions or updates, please contact   Please consult [email or web address] for contact  info under Cardiology/STEMI.

## 2017-03-21 NOTE — Consult Note (Signed)
Consultation Note Date: 03/21/2017   Patient Name: Daniel Dickson  DOB: 10/29/1912  MRN: [id number]  Age / Sex: [age] y.o., male  PCP: Dortha KernBliss, Laura K, MD Referring Physician: Delfino LovettShah, Vipul, MD  Reason for Consultation: Establishing goals of care  HPI/Patient Profile: Daniel Dickson  is a [age] y.o. male  recently discharged for GI bleeding thought to be due to diverticular disease 2 weeks ago, he has a chronic indwelling Foley catheter, chronic respiratory failure on 2 L continuous, presented to the emergency room with penile pain as well as rectal pain which he has had for numerous months, workup in the emergency room noted for troponin of 0.17 with abnormal urinalysis concerning for UTI,    Clinical Assessment and Goals of Care: Daniel Dickson states he has 8 children. His wife died 13 years ago. He states since he made financial decisions to have his daughter take care of his finances, 6 of his children do not speak to him. He states one son who speaks to him lives in [place]. He states his daughter Daniel Dickson who takes care of his finances lives next to him, and one of his sons Daniel Dickson has Down's Syndrome and lives with him. He states he does not want any of his children called for a GOC conversation, he makes decisions himself.  He states at home, he hires people that come into the home every day to help clean and cook. He states they help him to take a bathe and dress. Prior to the weather becoming cold, he would go out for lunch with his aide. He states he has not driven in 2 years.   We discussed his diagnoses, prognosis, GOC, EOL wishes disposition and options.  A detailed discussion was had today regarding advanced directives.  Concepts specific to code status, artifical feeding and hydration, continued IV antibiotics and rehospitalization was discussed.  The difference between an aggressive medical intervention path  and a hospice comfort care path was duscsused.  Values and goals of care important to patient and family were discussed.   He states he wants chest compressions, shocks, and a breathing tube if needed. He states to "try it , why not, and if it doesn't work, so be it". He states he lives his life day by day, and wants his heart to beat as long as it possibly can. We discussed a ventilator for respiratory distress and his COPD, he states "why wouldn't I do it as long as I could, I won't know the difference." When discussed that his family would have to make decisions to withdraw care, he states " my daughter will leave me on it as long as possible".  He does state he would not want a feeding tube placed.     Patient is Management consultantdecision maker.    SUMMARY OF RECOMMENDATIONS   Full code, aggressive care.  He does not want a feeding tube if it were indicated.    Code Status/Advance Care Planning:  Full code    Symptom Management:   Spoke  with pharmacy, concern for using Pyridium due to GFR.    Palliative Prophylaxis:   Aspiration  Additional Recommendations (Limitations, Scope, Preferences):  Full Scope Treatment   Prognosis:   Unable to determine  Discharge Planning: To Be Determined      Primary Diagnoses: Present on Admission: . Elevated troponin . Hematuria   I have reviewed the medical record, interviewed the patient and family, and examined the patient. The following aspects are pertinent.  Past Medical History:  Diagnosis Date  . Anxiety   . COPD (chronic obstructive pulmonary disease) (HCC)   . Coronary artery disease    a. 2012 s/p MI/cardiac arrest--> PCI mRCA.  Marland Kitchen Hypertension   . Hypertensive heart disease   . Mixed hyperlipidemia   . PTSD (post-traumatic stress disorder)    with headaches  . Systolic CHF [facility])    Social History   Socioeconomic History  . Marital status: Widowed    Spouse name: Not on file  . Number of children: Not on file  . Years of  education: Not on file  . Highest education level: Not on file  Social Needs  . Financial resource strain: Not on file  . Food insecurity - worry: Not on file  . Food insecurity - inability: Not on file  . Transportation needs - medical: Not on file  . Transportation needs - non-medical: Not on file  Occupational History  . Occupation: retired  Tobacco Use  . Smoking status: Former Smoker    Packs/day: 3.00    Years: 40.00    Pack years: 120.00    Types: Cigarettes  . Smokeless tobacco: Never Used  . Tobacco comment: quit 62 years  Substance and Sexual Activity  . Alcohol use: Yes    Alcohol/week: 1.8 oz    Types: 3 Glasses of wine per week    Comment: glass of wine at night  . Drug use: No  . Sexual activity: Not Currently  Other Topics Concern  . Not on file  Social History Narrative  . Not on file   Family History  Problem Relation Age of Onset  . COPD Father   . Bladder Cancer Neg Hx   . Kidney cancer Neg Hx   . Prostate cancer Neg Hx    Scheduled Meds: . aspirin EC  81 mg Oral Daily  . feeding supplement (ENSURE ENLIVE)  237 mL Oral BID BM  . heparin  5,000 Units Subcutaneous Q8H  . hydrocortisone   Topical BID  . lactose free nutrition  1 Container Oral TID BM  . mouth rinse  15 mL Mouth Rinse BID  . Melatonin  10 mg Oral QHS  . metoprolol tartrate  25 mg Oral BID  . multivitamin with minerals  1 tablet Oral Daily  . oxybutynin  5 mg Oral QHS  . pantoprazole (PROTONIX) IV  40 mg Intravenous Q24H  . polyethylene glycol  17 g Oral Daily  . psyllium  1 packet Oral Daily  . sodium chloride flush  3 mL Intravenous Q12H  . tiotropium  18 mcg Inhalation QPM   Continuous Infusions: . sodium chloride    . cefTRIAXone (ROCEPHIN) 1 g IVPB    . [START ON 03/22/2017] vancomycin     PRN Meds:.sodium chloride, acetaminophen, albuterol, HYDROcodone-acetaminophen, lidocaine, magnesium hydroxide, morphine injection, nitroGLYCERIN, ondansetron **OR** ondansetron (ZOFRAN)  IV, polyethylene glycol, sodium chloride flush Medications Prior to Admission:  Prior to Admission medications   Medication Sig Start Date End Date Taking? Authorizing Provider  albuterol (PROAIR HFA) 108 (90 BASE) MCG/ACT inhaler Inhale 2 puffs into the lungs every 6 (six) hours as needed. For wheezing.   Yes [provider]  albuterol (PROVENTIL) (2.5 MG/3ML) 0.083% nebulizer solution Take 3 mLs (2.5 mg total) by nebulization every 4 (four) hours as needed for wheezing or shortness of breath. 09/28/16  Yes Sudini, Wardell HeathSrikar, MD  furosemide (LASIX) 40 MG tablet Take 0.5 tablets (20 mg total) by mouth daily. 01/04/17  Yes Houston SirenSainani, Vivek J, MD  nitroGLYCERIN (NITROSTAT) 0.4 MG SL tablet Place 0.4 mg under the tongue every 5 (five) minutes as needed for chest pain. Reported on 10/19/2015   Yes [provider]  oxybutynin (DITROPAN-XL) 5 MG 24 hr tablet Take 5 mg by mouth daily as needed.   Yes [provider]  polyethylene glycol (MIRALAX / GLYCOLAX) packet Take 17 g by mouth daily. 03/05/17  Yes Mody, Sital, MD  psyllium (HYDROCIL/METAMUCIL) 95 % PACK Take 1 packet by mouth daily. 03/05/17  Yes Adrian SaranMody, Sital, MD  tiotropium (SPIRIVA) 18 MCG inhalation capsule Place 1 capsule (18 mcg total) into inhaler and inhale every evening. 10/09/15 01/14/18 Yes Adrian SaranMody, Sital, MD  acetaminophen (TYLENOL) 500 MG tablet Take 500 mg by mouth daily as needed for mild pain or moderate pain.     [provider]  feeding supplement (BOOST / RESOURCE BREEZE) LIQD Take 1 Container by mouth 3 (three) times daily between meals. 11/02/15   Katharina CaperVaickute, Rima, MD  magnesium hydroxide (MILK OF MAGNESIA) 400 MG/5ML suspension Take 30 mLs by mouth 2 (two) times daily as needed for mild constipation or moderate constipation. 11/02/15   Katharina CaperVaickute, Rima, MD  metoprolol tartrate (LOPRESSOR) 25 MG tablet Take 1 tablet (25 mg total) by mouth 2 (two) times daily. Patient not taking: Reported on 03/21/2017 03/05/17    Adrian SaranMody, Sital, MD   Allergies  Allergen Reactions  . Iodine (Kelp) [Iodine] Nausea And Vomiting   Review of Systems  Genitourinary: Positive for penile pain.    Physical Exam  Constitutional: He appears distressed.  Pulmonary/Chest:  Audible wheezing  Neurological: He is alert.  Oriented  Skin: Skin is warm and dry.    Vital Signs: BP (!) 186/99 (BP Location: Left Arm)   Pulse (!) 102   Temp 98.2 F (36.8 C) (Oral)   Resp (!) 24   Ht 5\' 6"  (1.676 m)   Wt 54.6 kg (120 lb 4.8 oz)   SpO2 100%   BMI 19.42 kg/m  Pain Assessment: No/denies pain POSS *See Group Information*: 1-Acceptable,Awake and alert Pain Score: 0-No pain   SpO2: SpO2: 100 % O2 Device:SpO2: 100 % O2 Flow Rate: .O2 Flow Rate (L/min): 2 L/min  IO: Intake/output summary:   Intake/Output Summary (Last 24 hours) at 03/21/2017 1152 Last data filed at 03/21/2017 1022 Gross per 24 hour  Intake 1855.17 ml  Output 100 ml  Net 1755.17 ml    LBM: Last BM Date: 03/21/17 Baseline Weight: Weight: 56.2 kg (124 lb) Most recent weight: Weight: 54.6 kg (120 lb 4.8 oz)     Palliative Assessment/Data: 40%     Time In: 11:40 Time Out: 12:30 Time Total: 50 min Greater than 50%  of this time was spent counseling and coordinating care related to the above assessment and plan.  Signed by: Morton Stallrystal Altheria Shadoan, NP 03/21/2017 12:51 PM Office: [phone] 7am-7pm  Please see Amion for pager number and availability  Call primary team after hours   Please contact Palliative Medicine Team phone at [phone] for  questions and concerns.  For individual provider: See Amion             

## 2017-03-21 NOTE — Progress Notes (Signed)
Patient requesting penile pain relief; Lidocaine order a one time order, completed in ED; Hospitalist paged for PRN order. Awaiting callback. Windy Carinaurner,Maizey Menendez K, RN 3:43 AM 03/21/2017

## 2017-03-21 NOTE — Progress Notes (Signed)
[facility] - [place] at [facility]   PATIENT NAME: Daniel BussingGeorge Dickson    MR#:  [id number]  DATE OF BIRTH:  06/25/1912  SUBJECTIVE:  CHIEF COMPLAINT:   Chief Complaint  Patient presents with  . Rectal Pain  c/o penile pain, passing clots in foley, confused REVIEW OF SYSTEMS:  Review of Systems  Unable to perform ROS: Mental acuity   DRUG ALLERGIES:   Allergies  Allergen Reactions  . Iodine (Kelp) [Iodine] Nausea And Vomiting   VITALS:  Blood pressure (!) 186/99, pulse (!) 102, temperature 98.2 F (36.8 C), temperature source Oral, resp. rate (!) 24, height 5\' 6"  (1.676 m), weight 54.6 kg (120 lb 4.8 oz), SpO2 100 %. PHYSICAL EXAMINATION:  Physical Exam  Constitutional: He appears malnourished.  HENT:  Head: Normocephalic and atraumatic.  Eyes: Conjunctivae and EOM are normal. Pupils are equal, round, and reactive to light.  Neck: Normal range of motion. Neck supple. No tracheal deviation present. No thyromegaly present.  Cardiovascular: Normal rate, regular rhythm and normal heart sounds.  Pulmonary/Chest: Effort normal and breath sounds normal. No respiratory distress. He has no wheezes. He exhibits no tenderness.  Abdominal: Soft. Bowel sounds are normal. He exhibits no distension. There is no tenderness.  Musculoskeletal: Normal range of motion.  Neurological: He is alert. He is disoriented. No cranial nerve deficit.  Skin: Skin is warm and dry. No rash noted.  Psychiatric: Mood and affect normal.   LABORATORY PANEL:  Male CBC Recent Labs  Lab 03/21/17 0325  WBC 7.5  HGB 11.1*  HCT 32.6*  PLT 167   ------------------------------------------------------------------------------------------------------------------ Chemistries  Recent Labs  Lab 03/21/17 0012  NA 138  K 3.8  CL 104  CO2 24  GLUCOSE 104*  BUN 28*  CREATININE 1.14  CALCIUM 9.2   RADIOLOGY:  No results found. ASSESSMENT AND PLAN:   1 Acute probable complicated UTI  - has  chronic indwelling Foley with gross hematuria  - Continue empiric Rocephin, follow up on cultures   2 Acute hematuria - Most likely secondary to UTI and chronic indwelling Foley - Await urology opinion given penile pain as well, unknown when Foley was last changed (patient says 3 weeks ago?)-patient does not know/poor historian,   3 Acute on chronic penile pain Most likely secondary to chronic indwelling Foley catheter - Await urology input  4 Acute on rectal pain - Suspected may be related to hemorrhoidal pain, nonthrombosed external hemorrhoid noted - trial of Anusol twice daily  5 Elevated troponins: likely demand ischemia Patient without chest pain/shortness of breath Continue BB, nitrates prn, aspirin, supplemental oxygen as needed, IV morphine as needed breakthrough pain, rule out acute urinary syndrome with cardiac enzymes x3 sets    Prognosis poor given advanced age, Will get Palliative care c/s - multiple readmissions    All the records are reviewed and case discussed with Care Management/Social Worker. Management plans discussed with the patient, Nursing and they are in agreement.  CODE STATUS: Full Code  TOTAL TIME TAKING CARE OF THIS PATIENT: 35 minutes.   More than 50% of the time was spent in counseling/coordination of care: YES  POSSIBLE D/C IN 1-2 DAYS, DEPENDING ON CLINICAL CONDITION.   Delfino LovettVipul Katherine Tout M.D on 03/21/2017 at 10:18 AM  Between 7am to 6pm - Pager - [phone]  After 6pm go to [email or web address] - Social research officer, governmentpassword EPAS [facility]  [facility] [place] [facility]  Office  [phone]  CC: Primary care physician; Dortha KernBliss, Laura K, MD  Note: This dictation was  prepared with Dragon dictation along with smaller phrase technology. Any transcriptional errors that result from this process are unintentional.

## 2017-03-21 NOTE — Consult Note (Signed)
Urology Consult  I have been asked to see the patient by Dr. Katheren ShamsSalary, for evaluation and management of hematuria and penile pain.  Chief Complaint: Pain  History of Present Illness: Daniel Dickson is a [age] y.o. year old with chronic urinary retention managed with an indwelling Foley catheter.  He has significant medical comorbidities.  He has had several hospitalizations for urinary tract infections.  He presented to the ED complaining of abdominal and penile pain.  He was noted to have gross hematuria.  It is not clear when his catheter was last changed.  He has been seen in our office with complaints of chronic penile and groin pain felt to be secondary to bladder spasms.  He is on oxybutynin.  Past Medical History:  Diagnosis Date  . Anxiety   . BPH (benign prostatic hyperplasia)    a. complicated by urinary retention req chronic foley.  . Cellulitis    a. 10/2016 - Right leg.  . Chronic systolic CHF (congestive heart failure) (HCC)    a. 02/2016 Echo: EF 40-45%, no rwma, mildly dil LA.  Marland Kitchen. Chronic Urinary retention    a. Chronic indwelling foley.  Marland Kitchen. COPD (chronic obstructive pulmonary disease) (HCC)    a. On chronic supplemental O2 via [place].  Marland Kitchen. Coronary artery disease    a. 03/2011 Inflat STEMI/PCI ([facility]): LM nl, LAD 50p, LCX 6759m, RI nl, RCA 100p (2.75x22 MDT Integrity BMS)-->post procedure course complicated by RFV DVT req coumadin and then R SFA PSA & hematoma req surgical evacuation.  . DVT (deep venous thrombosis) (HCC)    a. 03/2011 R Femoral Vein DVT following Cath/PCI.  Marland Kitchen. Hematuria   . Hemorrhoids   . History of GI diverticular bleed    a. 02/2017 - rectal bleeding felt to be diverticular-->colonoscopy deferrred.  . Hypertensive heart disease   . Ischemic cardiomyopathy    a. 02/2016 Echo: EF 40-45%.  . Mixed hyperlipidemia   . PTSD (post-traumatic stress disorder)    with headaches  . Right SFA Pseudoaneurysm (HCC)    a. 03/2011 following Cath/PCI-->complicated  by hematoma req surgical evacuation.    Past Surgical History:  Procedure Laterality Date  . CATARACT EXTRACTION    . CHOLECYSTECTOMY    . CORONARY ANGIOPLASTY     with stent; [facility]  . HAND SURGERY    . HEMORRHOID SURGERY    . HIP SURGERY    . VASCULAR SURGERY     stent R femoral artery    Home Medications:  Current Meds  Medication Sig  . albuterol (PROAIR HFA) 108 (90 BASE) MCG/ACT inhaler Inhale 2 puffs into the lungs every 6 (six) hours as needed. For wheezing.  Marland Kitchen. albuterol (PROVENTIL) (2.5 MG/3ML) 0.083% nebulizer solution Take 3 mLs (2.5 mg total) by nebulization every 4 (four) hours as needed for wheezing or shortness of breath.  . furosemide (LASIX) 40 MG tablet Take 0.5 tablets (20 mg total) by mouth daily.  . nitroGLYCERIN (NITROSTAT) 0.4 MG SL tablet Place 0.4 mg under the tongue every 5 (five) minutes as needed for chest pain. Reported on 10/19/2015  . oxybutynin (DITROPAN-XL) 5 MG 24 hr tablet Take 5 mg by mouth daily as needed.  . polyethylene glycol (MIRALAX / GLYCOLAX) packet Take 17 g by mouth daily.  . psyllium (HYDROCIL/METAMUCIL) 95 % PACK Take 1 packet by mouth daily.  Marland Kitchen. tiotropium (SPIRIVA) 18 MCG inhalation capsule Place 1 capsule (18 mcg total) into inhaler and inhale every evening.    Allergies:  Allergies  Allergen Reactions  . Iodine (Kelp) [Iodine] Nausea And Vomiting    Family History  Problem Relation Age of Onset  . COPD Father   . Bladder Cancer Neg Hx   . Kidney cancer Neg Hx   . Prostate cancer Neg Hx     Social History:  reports that he has quit smoking. His smoking use included cigarettes. He has a 120.00 pack-year smoking history. he has never used smokeless tobacco. He reports that he drinks about 1.8 oz of alcohol per week. He reports that he does not use drugs.  ROS: A complete review of systems was performed.  All systems are negative except for pertinent findings as noted.  Physical Exam:  Vital signs in last 24 hours: Temp:   [97.4 F (36.3 C)-98.5 F (36.9 C)] 97.4 F (36.3 C) (12/11 1213) Pulse Rate:  [81-108] 81 (12/11 1213) Resp:  [14-24] 24 (12/11 0606) BP: (111-186)/(51-107) 111/51 (12/11 1213) SpO2:  [97 %-100 %] 100 % (12/11 1213) Weight:  [120 lb 4.8 oz (54.6 kg)-124 lb (56.2 kg)] 120 lb 4.8 oz (54.6 kg) (12/11 0310) Constitutional:  No acute distress HEENT: [name] AT, moist mucus membranes.  Trachea midline, no masses Cardiovascular: Regular rate and rhythm, no clubbing, cyanosis, or edema. Respiratory: Normal respiratory effort, lungs clear bilaterally GI: Abdomen is soft, nontender, nondistended, no abdominal masses GU: No CVA tenderness.  Penis without lesions.  Indwelling 16 French Foley catheter with bloody urine in tubing.  The catheter would not irrigate Skin: No rashes, bruises or suspicious lesions Lymph: No cervical or inguinal adenopathy Neurologic: Grossly intact, no focal deficits, moving all 4 extremities Psychiatric: Normal mood and affect   Laboratory Data:  Recent Labs    03/21/17 0012 03/21/17 0325 03/21/17 1353  WBC 7.6 7.5 8.8  HGB 10.9* 11.1* 11.2*  HCT 32.8* 32.6* 34.0*   Recent Labs    03/21/17 0012  NA 138  K 3.8  CL 104  CO2 24  GLUCOSE 104*  BUN 28*  CREATININE 1.14  CALCIUM 9.2   No results for input(s): LABPT, INR in the last 72 hours. No results for input(s): LABURIN in the last 72 hours. Results for orders placed or performed during the hospital encounter of 03/21/17  Blood culture (routine x 2)     Status: None (Preliminary result)   Collection Time: 03/21/17  1:05 AM  Result Value Ref Range Status   Specimen Description BLOOD RIGHT AC  Final   Special Requests   Final    BOTTLES DRAWN AEROBIC AND ANAEROBIC Blood Culture adequate volume   Culture NO GROWTH < 12 HOURS  Final   Report Status PENDING  Incomplete  Blood culture (routine x 2)     Status: None (Preliminary result)   Collection Time: 03/21/17  1:05 AM  Result Value Ref Range Status    Specimen Description BLOOD LEFT ARM  Final   Special Requests   Final    BOTTLES DRAWN AEROBIC AND ANAEROBIC Blood Culture adequate volume   Culture NO GROWTH < 12 HOURS  Final   Report Status PENDING  Incomplete   His Foley catheter was removed and an 7318 [place]French Foley was placed without difficulty.  Approximately 500 mL of clear urine was obtained.  The catheter irrigated freely without hematuria and no clots were obtained.   Impression/Assessment/Recommendation:  [age] y.o. male with chronic urinary retention.  He had no significant gross hematuria upon catheter change.  Would irrigate his catheter as needed.  His penile  pain is most likely secondary to bladder spasms which may be difficult to manage.  Could consider a trial of B&O suppository however based on his advanced age he is more likely to have side effects from narcotic medication.    03/21/2017, 2:45 PM  Irineo Axon,  MD   Thank you for involving me in this patient's care, I will continue to follow along. Please page with any further questions or concerns. Daniel Dickson

## 2017-03-21 NOTE — ED Notes (Signed)
Dr. Zenda AlpersWebster made aware of critical troponin lab value of 0.17

## 2017-03-21 NOTE — Progress Notes (Signed)
Dr. Tobi BastosPyreddy notified of increase in bilateral crackles; IVF at 75/hr and Hx. Of CHF; New orders written; decrease IVF to 2650ml/hour; encouraged Cough and deep breathing; Windy Carinaurner,Trever Streater K, RN 6:34 AM12/02/2017

## 2017-03-21 NOTE — ED Triage Notes (Signed)
Pt arrived via EMS from home with c/o rectal pain and increased weakness. Pt is A&O with 9/10 pain. Pt denies any n/v. Pt is currently on 2L of oxygen which he uses at home.

## 2017-03-22 DIAGNOSIS — N390 Urinary tract infection, site not specified: Secondary | ICD-10-CM | POA: Diagnosis not present

## 2017-03-22 DIAGNOSIS — R531 Weakness: Secondary | ICD-10-CM | POA: Diagnosis not present

## 2017-03-22 DIAGNOSIS — K648 Other hemorrhoids: Secondary | ICD-10-CM | POA: Diagnosis not present

## 2017-03-22 DIAGNOSIS — R748 Abnormal levels of other serum enzymes: Secondary | ICD-10-CM | POA: Diagnosis not present

## 2017-03-22 LAB — BASIC METABOLIC PANEL
Anion gap: 7 (ref 5–15)
BUN: 30 mg/dL — AB (ref 6–20)
CALCIUM: 8.9 mg/dL (ref 8.9–10.3)
CO2: 26 mmol/L (ref 22–32)
CREATININE: 0.96 mg/dL (ref 0.61–1.24)
Chloride: 102 mmol/L (ref 101–111)
GFR calc non Af Amer: >60 mL/min (ref >60)
GLUCOSE: 146 mg/dL — AB (ref 65–99)
Potassium: 4.2 mmol/L (ref 3.5–5.1)
Sodium: 135 mmol/L (ref 135–145)

## 2017-03-22 LAB — CBC
HEMATOCRIT: 33.9 % — AB (ref 40.0–52.0)
Hemoglobin: 11.4 g/dL — ABNORMAL LOW (ref 13.0–18.0)
MCH: 32.1 pg (ref 26.0–34.0)
MCHC: 33.6 g/dL (ref 32.0–36.0)
MCV: 95.4 fL (ref 80.0–100.0)
Platelets: 171 10*3/uL (ref 150–440)
RBC: 3.56 MIL/uL — ABNORMAL LOW (ref 4.40–5.90)
RDW: 15.3 % — AB (ref 11.5–14.5)
WBC: 4.3 10*3/uL (ref 3.8–10.6)

## 2017-03-22 MED ORDER — ALBUTEROL SULFATE (2.5 MG/3ML) 0.083% IN NEBU: 2.5000 mg | INHALATION_SOLUTION | RESPIRATORY_TRACT | Status: DC

## 2017-03-22 MED ORDER — HYDRALAZINE HCL 20 MG/ML IJ SOLN
10.0000 mg | INTRAMUSCULAR | Status: DC | PRN
  Administered 2017-03-22: 10 mg via INTRAVENOUS
  Filled 2017-03-22: qty 1

## 2017-03-22 NOTE — Progress Notes (Signed)
[facility] - [place] [facility]   PATIENT NAME: Daniel BussingGeorge Dickson    MR#:  [id number]  DATE OF BIRTH:  07/10/1912  SUBJECTIVE: Patient says that he is feeling much better today and he did not feel like this since past 5 months.  Alert, awake, oriented, eating well.  He to work with physical therapy today /no further hematuria, denies any abdominal cramps.  CHIEF COMPLAINT:   Chief Complaint  Patient presents with  . Rectal Pain  c/o penile pain, passing clots in foley, confused REVIEW OF SYSTEMS:  Review of Systems  Constitutional: Negative for chills and fever.  HENT: Negative for hearing loss.   Eyes: Negative for blurred vision, double vision and photophobia.  Respiratory: Negative for cough, hemoptysis and shortness of breath.   Cardiovascular: Negative for palpitations, orthopnea and leg swelling.  Gastrointestinal: Negative for abdominal pain, diarrhea and vomiting.  Genitourinary: Negative for dysuria and urgency.  Musculoskeletal: Negative for myalgias and neck pain.  Skin: Negative for rash.  Neurological: Negative for dizziness, focal weakness, seizures, weakness and headaches.  Psychiatric/Behavioral: Negative for memory loss. The patient does not have insomnia.    DRUG ALLERGIES:   Allergies  Allergen Reactions  . Iodine (Kelp) [Iodine] Nausea And Vomiting   VITALS:  Blood pressure 136/65, pulse 100, temperature (!) 97.4 F (36.3 C), temperature source Oral, resp. rate 16, height 5\' 6"  (1.676 m), weight 54.6 kg (120 lb 4.8 oz), SpO2 97 %. PHYSICAL EXAMINATION:  Physical Exam  Constitutional: He appears malnourished.  HENT:  Head: Normocephalic and atraumatic.  Eyes: Conjunctivae and EOM are normal. Pupils are equal, round, and reactive to light.  Neck: Normal range of motion. Neck supple. No tracheal deviation present. No thyromegaly present.  Cardiovascular: Normal rate, regular rhythm and normal heart sounds.  Pulmonary/Chest: Effort normal  and breath sounds normal. No respiratory distress. He has no wheezes. He exhibits no tenderness.  Abdominal: Soft. Bowel sounds are normal. He exhibits no distension. There is no tenderness.  Musculoskeletal: Normal range of motion.  Neurological: He is alert. He is not disoriented. No cranial nerve deficit.  Skin: Skin is warm and dry. No rash noted.  Psychiatric: Mood and affect normal.   LABORATORY PANEL:  Male CBC Recent Labs  Lab 03/22/17 0421  WBC 4.3  HGB 11.4*  HCT 33.9*  PLT 171   ------------------------------------------------------------------------------------------------------------------ Chemistries  Recent Labs  Lab 03/22/17 0421  NA 135  K 4.2  CL 102  CO2 26  GLUCOSE 146*  BUN 30*  CREATININE 0.96  CALCIUM 8.9   RADIOLOGY:  No results found. ASSESSMENT AND PLAN:   1 Acute probable complicated UTI urine culture showed Serratia, more than 100,000 colonies colonies, waiting for sensitivity results.,  Continue Rocephin. - has chronic indwelling Foley, patient had UTI, hematuria on admission, seen by urology, Foley is changed.  2. Acute hematuria - Most likely secondary to UTI and chronic indwelling Foley' patient changed by urology, no further hematuria.. -  3. Acute on chronic penile pain Most likely secondary to chronic indwelling Foley catheter, no further rectal pain after getting antibiotics, Foley changed.   4. Acute on rectal pain - Suspected may be related to hemorrhoidal pain, nonthrombosed external hemorrhoid noted - trial of Anusol twice daily  5. Elevated troponins: likely demand ischemia Patient without chest pain/shortness of breath seen cardiology, no further cardiac intervention due to advanced age.  Continue beta-blockers/ #6 /chronic respiratory failure; dry cough, wheezing: Patient already on steroids, bronchodilators, antibiotics, Spiriva,  bronchodilators. #7 deconditioning: Physical therapy consult today. All the records are  reviewed and case discussed with Care Management/Social Worker. Management plans discussed with the patient, Nursing and they are in agreement.  CODE STATUS: Full Code  TOTAL TIME TAKING CARE OF THIS PATIENT: 35 minutes.   More than 50% of the time was spent in counseling/coordination of care: YES  POSSIBLE D/C IN 1-2 DAYS, DEPENDING ON CLINICAL CONDITION.   Katha HammingSnehalatha Talley Kreiser M.D on 03/22/2017 at 11:23 AM  Between 7am to 6pm - Pager - [phone]  After 6pm go to [email or web address] - Social research officer, governmentpassword [email or web address] [email or web address]  [facility] [place] [facility]  Office  [phone]  CC: Primary care physician; Dortha KernBliss, Laura K, MD  Note: This dictation was prepared with Dragon dictation along with smaller phrase technology. Any transcriptional errors that result from this process are unintentional.

## 2017-03-22 NOTE — Consult Note (Signed)
Patient sleeping. Foley catheter draining clear urine.  Impression: Gross hematuria-resolved.  Will sign off.  Please contact for any concerns.

## 2017-03-22 NOTE — Care Management (Signed)
Patient admitted with a UTI.  Patient lives at home with his son who is developmenttaly delayed.  Daughter lives locally for support.  Patient has chronic foley.  [facility] is [facility] (please contact Darl PikesSusan [phone] when patient is discharged).  Patient has a caregiver in the home from hours 11-5.  RNCM following.

## 2017-03-22 NOTE — Progress Notes (Signed)
MD notified of elevated BP. PRN hydralazine ordered. Will give and continue to monitor.

## 2017-03-22 NOTE — Progress Notes (Signed)
PT Cancellation Note  Patient Details Name: Calton GoldsGeorge Henry Boissonneault MRN: [id number] DOB: 07/18/1912   Cancelled Treatment:    Reason Eval/Treat Not Completed: Other (comment).  PT consult received.  Chart reviewed.  Nursing and SW reporting pt has hospice services at home.  Pt very talkative/verbose with therapist giving his history/background information during attempted session.  Pt reporting that he had hospice start on Monday.  Pt also reports that he has not walked in 3 weeks and has not gotten out of bed in 5 days.  When asked (2x's) if pt had gotten OOB into a chair >5 days ago, pt did not clearly answer that question.  PT educated pt on purpose of PT consult and attempted to discuss pt's goals with functional mobility.  Pt then asking therapist what therapy could do for him in "24 hours" (d/t pt reporting anticipating discharging home soon).  PT attempted to educate pt on benefits of therapy but pt did not appear satisfied with answers.  Therapist asked pt if he wanted to participate in PT today (or any time during hospital stay) but pt did not give a direct answer when asked multiple times (pt did not appear willing to participate in PT currently).  Nursing notified of above.  Hendricks LimesEmily Takera Rayl, PT 03/22/17, 2:55 PM [phone]

## 2017-03-23 MED ORDER — PREDNISONE 10 MG (21) PO TBPK: ORAL_TABLET | ORAL | 0 refills | Status: DC

## 2017-03-23 MED ORDER — AZITHROMYCIN 250 MG PO TABS: ORAL_TABLET | ORAL | 0 refills | Status: AC

## 2017-03-23 MED ORDER — IPRATROPIUM-ALBUTEROL 0.5-2.5 (3) MG/3ML IN SOLN: 3.0000 mL | RESPIRATORY_TRACT | 30 refills | Status: AC

## 2017-03-23 MED ORDER — AMOXICILLIN-POT CLAVULANATE 875-125 MG PO TABS: 1.0000 | ORAL_TABLET | Freq: Two times a day (BID) | ORAL | 0 refills | Status: DC

## 2017-03-23 MED ORDER — CIPROFLOXACIN HCL 500 MG PO TABS: 500.0000 mg | ORAL_TABLET | Freq: Two times a day (BID) | ORAL | 0 refills | Status: AC

## 2017-03-23 MED ORDER — HYDROCORTISONE 2.5 % RE CREA: TOPICAL_CREAM | Freq: Two times a day (BID) | RECTAL | 0 refills | Status: AC

## 2017-03-23 MED ORDER — ASPIRIN 81 MG PO CHEW: 81.0000 mg | CHEWABLE_TABLET | Freq: Every day | ORAL | 11 refills | Status: DC

## 2017-03-23 MED ORDER — ADULT MULTIVITAMIN W/MINERALS CH: 1.0000 | ORAL_TABLET | Freq: Every day | ORAL | 0 refills | Status: DC

## 2017-03-23 MED ORDER — OXYBUTYNIN CHLORIDE ER 5 MG PO TB24: 5.0000 mg | ORAL_TABLET | Freq: Every day | ORAL | 0 refills | Status: DC

## 2017-03-23 NOTE — Care Management (Signed)
Patient to discharge home today.  Daughter states that she lives next door, and goes over and assists the patient when he does not have private duty care givers.  Patient Has O2, RA, and nebulizer in the home.  Patient to discharge with resumption of Hospice.  Rosalita ChessmanSuzanne with [facility] notified of discharge.  Patient to transport by EMS.  RNCM singing off.

## 2017-03-23 NOTE — Evaluation (Signed)
Physical Therapy Evaluation Patient Details Name: Daniel Dickson MRN: [id number] DOB: 12-28-1912 Today's Date: 03/23/2017   History of Present Illness  Pt is a 83 y.o. male presenting to hospital with rectum and penile pain x6 months but increasing pain.  Pt admitted with acute probable complicated UTI, acute hematuria, acute on chronic penile pain, acute rectal pain, and elevated troponin likely demand ischemia.  PMH includes COPD, CAD, htn, PTSD, CHF, hip surgery, chronic indwelling foley.  Clinical Impression  Prior to hospital admission, pt was ambulating up to 15 feet at a time with RW and assist to steady (assist from daughter or aide).  Pt lives with his son (who can not assist pt) in 1 level home with ramp to enter and has hospice services; also has good support from pt's daughter.  Pt verbose beginning of session; after pt able to state his thoughts/feelings, pt said "lets just do this" and agreeable to getting up to walk (and end of session pt reporting being happy that he walked).  Currently pt is modified independent with bed mobility and CGA to min assist with transfers and ambulation 20 feet with RW (assist to steady occasionally).  Distance ambulating limited d/t SOB/wheezing (O2 sats 94% on 2 L O2 and HR 92 bpm post ambulation); MD notified.  Pt would benefit from skilled PT to address noted impairments and functional limitations (see below for any additional details).  Upon hospital discharge, recommend pt discharge to home with assist for functional mobility, use of RW for ambulation, and HHPT.    Follow Up Recommendations Home health PT(Assist for mobility/OOB)    Equipment Recommendations  Rolling walker with 5" wheels(pt already has RW at home)    Recommendations for Other Services       Precautions / Restrictions Precautions Precautions: Fall Restrictions Weight Bearing Restrictions: No      Mobility  Bed Mobility Overal bed mobility: Modified Independent              General bed mobility comments: Supine to/from sit with HOB elevated with mild increased effort but no physical assist required.  Transfers Overall transfer level: Needs assistance Equipment used: Rolling walker (2 wheeled) Transfers: Sit to/from Stand Sit to Stand: Min assist         General transfer comment: mild increased effort to stand; min assist to steady coming into standing using RW  Ambulation/Gait Ambulation/Gait assistance: Min guard;Min assist Ambulation Distance (Feet): 20 Feet Assistive device: Rolling walker (2 wheeled)   Gait velocity: decreased   General Gait Details: decreased B step length/foot clearance/heelstrike; limited distance d/t SOB/wheezing; CGA (occasional min assist to steady)  Careers information officer    Modified Rankin (Stroke Patients Only)       Balance Overall balance assessment: Needs assistance Sitting-balance support: No upper extremity supported;Feet supported Sitting balance-Leahy Scale: Good Sitting balance - Comments: steady sitting reaching within BOS   Standing balance support: Bilateral upper extremity supported Standing balance-Leahy Scale: Poor Standing balance comment: requires UE support on RW for static standing balance                             Pertinent Vitals/Pain Pain Assessment: No/denies pain    Home Living Family/patient expects to be discharged to:: Private residence Living Arrangements: Children(Pt's son with Down syndrome) Available Help at Discharge: Family;Personal care attendant;Available PRN/intermittently Type of Home: House Home Access: Ramped entrance  Home Layout: One level Home Equipment: Walker - 2 wheels;Walker - 4 wheels;Wheelchair - manual;Bedside commode;Grab bars - toilet;Grab bars - tub/shower Additional Comments: Chronic indwelling foley    Prior Function Level of Independence: Needs assistance   Gait / Transfers Assistance Needed:  Ambulating short distances with RW (about 15 feet from recliner to dining room table for meals); ambulates with assist to dining room table 1-2x's a day (assist to steady).  Requires assist to steady with standing d/t impaired balance.  ADL's / Homemaking Assistance Needed: Home health aide assistance  Comments: Daughter lives close by (frequently checks in and makes supper every night per last PT note).  Has 2 L home O2.     Hand Dominance        Extremity/Trunk Assessment   Upper Extremity Assessment Upper Extremity Assessment: Generalized weakness    Lower Extremity Assessment Lower Extremity Assessment: Generalized weakness       Communication   Communication: HOH  Cognition Arousal/Alertness: Awake/alert Behavior During Therapy: (verbose; verbalizing many things he was not pleased with ) Overall Cognitive Status: (Oriented to self)                                        General Comments General comments (skin integrity, edema, etc.): Pt resting in bed upon PT arrival.  Nursing cleared pt for participation in physical therapy.  Pt agreeable to limited PT session.    Exercises     Assessment/Plan    PT Assessment Patient needs continued PT services  PT Problem List Decreased strength;Decreased activity tolerance;Decreased balance;Decreased mobility;Cardiopulmonary status limiting activity       PT Treatment Interventions DME instruction;Gait training;Functional mobility training;Therapeutic activities;Therapeutic exercise;Balance training;Patient/family education    PT Goals (Current goals can be found in the Care Plan section)  Acute Rehab PT Goals Patient Stated Goal: to go home PT Goal Formulation: With patient Time For Goal Achievement: 04/06/17 Potential to Achieve Goals: Good    Frequency Min 2X/week   Barriers to discharge        Co-evaluation               AM-PAC PT "6 Clicks" Daily Activity  Outcome Measure Difficulty  turning over in bed (including adjusting bedclothes, sheets and blankets)?: A Little Difficulty moving from lying on back to sitting on the side of the bed? : A Little Difficulty sitting down on and standing up from a chair with arms (e.g., wheelchair, bedside commode, etc,.)?: Unable Help needed moving to and from a bed to chair (including a wheelchair)?: A Little Help needed walking in hospital room?: A Little Help needed climbing 3-5 steps with a railing? : A Lot 6 Click Score: 15    End of Session Equipment Utilized During Treatment: Gait belt;Oxygen Activity Tolerance: Patient limited by fatigue Patient left: in bed;with call bell/phone within reach;with bed alarm set;with SCD's reapplied(Respiratory therapist in room) Nurse Communication: Mobility status;Precautions PT Visit Diagnosis: Unsteadiness on feet (R26.81);Other abnormalities of gait and mobility (R26.89);Muscle weakness (generalized) (M62.81)    Time: [id number] PT Time Calculation (min) (ACUTE ONLY): 27 min   Charges:   PT Evaluation $PT Eval Low Complexity: 1 Low     PT G Codes:   PT G-Codes **NOT FOR INPATIENT CLASS** Functional Assessment Tool Used: AM-PAC 6 Clicks Basic Mobility Functional Limitation: Mobility: Walking and moving around Mobility: Walking and Moving Around Current Status (A2130(G8978): At least 40  percent but less than 60 percent impaired, limited or restricted Mobility: Walking and Moving Around Goal Status [phone](G8979): At least 1 percent but less than 20 percent impaired, limited or restricted    Hendricks Limesmily Nyshawn Gowdy, PT 03/23/17, 1:08 PM [phone]

## 2017-03-23 NOTE — Care Management CC44 (Signed)
Condition Code 44 Documentation Completed  Patient Details  Name: Daniel Dickson MRN: [id number] Date of Birth: 05/02/1912   Condition Code 44 given:  Yes Patient signature on Condition Code 44 notice:  Yes Documentation of 2 MD's agreement:  Yes Code 44 added to claim:  Yes    Chapman FitchBOWEN, Hamzeh Tall T, RN 03/23/2017, 11:00 AM

## 2017-03-23 NOTE — Progress Notes (Signed)
Discharge order received. Patient is alert and oriented. Vital signs stable . No signs of acute distress. Discharge instructions given. Patient verbalized understanding. No other issues noted at this time. Transported by EMS

## 2017-03-23 NOTE — Discharge Summary (Signed)
Daniel Dickson, is a 83 y.o. male  DOB 1912-10-12  MRN [id number].  Admission date:  03/21/2017  Admitting Physician  Bertrum Sol, MD  Discharge Date:  03/23/2017   Primary MD  [name], MD  Recommendations for primary care physician for things to follow:   Follow-up with PCP in 1 week   Admission Diagnosis  Weakness [R53.1] Elevated troponin [R74.8] Other hemorrhoids [K64.8] Urinary tract infection associated with indwelling urethral catheter, initial encounter (HCC) [X32.440N, N39.0] Hematuria [R31.9]   Discharge Diagnosis  Weakness [R53.1] Elevated troponin [R74.8] Other hemorrhoids [K64.8] Urinary tract infection associated with indwelling urethral catheter, initial encounter (HCC) [U27.253G, N39.0] Hematuria [R31.9]   Active Problems:   Urinary tract infection with hematuria   Elevated troponin   Hematuria   Demand ischemia [facility])      Past Medical History:  Diagnosis Date  . Anxiety   . BPH (benign prostatic hyperplasia)    a. complicated by urinary retention req chronic foley.  . Cellulitis    a. 10/2016 - Right leg.  . Chronic systolic CHF (congestive heart failure) (HCC)    a. 02/2016 Echo: EF 40-45%, no rwma, mildly dil LA.  Marland Kitchen Chronic Urinary retention    a. Chronic indwelling foley.  Marland Kitchen COPD (chronic obstructive pulmonary disease) (HCC)    a. On chronic supplemental O2 via [place].  Marland Kitchen Coronary artery disease    a. 03/2011 Inflat STEMI/PCI ([facility]): LM nl, LAD 50p, LCX 13m, RI nl, RCA 100p (2.75x22 MDT Integrity BMS)-->post procedure course complicated by RFV DVT req coumadin and then R SFA PSA & hematoma req surgical evacuation.  . DVT (deep venous thrombosis) (HCC)    a. 03/2011 R Femoral Vein DVT following Cath/PCI.  Marland Kitchen Hematuria   . Hemorrhoids   . History of GI diverticular bleed     a. 02/2017 - rectal bleeding felt to be diverticular-->colonoscopy deferrred.  . Hypertensive heart disease   . Ischemic cardiomyopathy    a. 02/2016 Echo: EF 40-45%.  . Mixed hyperlipidemia   . PTSD (post-traumatic stress disorder)    with headaches  . Right SFA Pseudoaneurysm (HCC)    a. 03/2011 following Cath/PCI-->complicated by hematoma req surgical evacuation.    Past Surgical History:  Procedure Laterality Date  . CATARACT EXTRACTION    . CHOLECYSTECTOMY    . CORONARY ANGIOPLASTY     with stent; [facility]  . HAND SURGERY    . HEMORRHOID SURGERY    . HIP SURGERY    . VASCULAR SURGERY     stent R femoral artery       History of present illness and  Hospital Course:     Kindly see H&P for history of present illness and admission details, please review complete Labs, Consult reports and Test reports for all details in brief  HPI  from the history and physical done on the day of admission 83 year old male patient admitted for hematuria patient has chronic indwelling Foley catheter.  He also had canal pain.   Hospital Course   #1 acute complicated UTI, hematuria: Admitted to medical service, started on Rocephin, for hematuria patient is seen by urology Dr. Lonna Cobb, patient Foley catheter is changed by urology, hematuria resolved.  2.  Serratia problems UTI resistant to multiple antibiotics Cipro 500 mg twice daily for 5 days , given at discharge.  3.  Penile pain acute on chronic secondary to chronic indwelling Foley, patient did have bladder spasms but they are better now. 4.  Acute  on chronic rectal pain likely due to hemorrhoids, patient received Anusol cream twice daily. 4.  Elevated troponins likely demand ischemia, without chest pain, shortness of breath, seen by cardiology, started on beta-blockers, aspirin.  Echocardiogram showed EF 40-40% by echo last year.  Cardiology did not recommend any further ischemia workup secondary to his advanced age. 5.  Chronic  respiratory failure secondary to COPD.  Patient did have wheezing, COPD exacerbation was in the hospital but did not have hypoxia.  Discharge home with bronchodilators, prednisone Dosepak.  Patient has 2 L of oxygen all the time. 6.  [place] with the daughter over the phone.  Patient has oxygen, nebulizer at home. #7 deconditioning: Patient initially refused physical therapy, physical therapy finally working with patient patient, they recommended home health physical therapy but he refused he says he has home health private sitters come from 11-5 every day.  Discussed with patient daughter she told me that he sits in recliner whole day long.  And also she lies down  in recliner.  Discharge Condition: Stable.   Follow UP      Discharge Instructions  and  Discharge Medications      Allergies as of 03/23/2017      Reactions   Iodine (kelp) [iodine] Nausea And Vomiting      Medication List    TAKE these medications   acetaminophen 500 MG tablet Commonly known as:  TYLENOL Take 500 mg by mouth daily as needed for mild pain or moderate pain.   aspirin 81 MG chewable tablet Commonly known as:  ASPIRIN CHILDRENS Chew 1 tablet (81 mg total) by mouth daily.   azithromycin 250 MG tablet Commonly known as:  ZITHROMAX Z-PAK Take 2 tablets (500 mg) on  Day 1,  followed by 1 tablet (250 mg) once daily on Days 2 through 5.   ciprofloxacin 500 MG tablet Commonly known as:  CIPRO Take 1 tablet (500 mg total) by mouth 2 (two) times daily for 5 doses.   feeding supplement Liqd Take 1 Container by mouth 3 (three) times daily between meals.   furosemide 40 MG tablet Commonly known as:  LASIX Take 0.5 tablets (20 mg total) by mouth daily.   hydrocortisone 2.5 % rectal cream Commonly known as:  ANUSOL-HC Apply topically 2 (two) times daily.   ipratropium-albuterol 0.5-2.5 (3) MG/3ML Soln Commonly known as:  DUONEB Take 3 mLs by nebulization every 4 (four) hours.   magnesium  hydroxide 400 MG/5ML suspension Commonly known as:  MILK OF MAGNESIA Take 30 mLs by mouth 2 (two) times daily as needed for mild constipation or moderate constipation.   metoprolol tartrate 25 MG tablet Commonly known as:  LOPRESSOR Take 1 tablet (25 mg total) by mouth 2 (two) times daily.   multivitamin with minerals Tabs tablet Take 1 tablet by mouth daily. Start taking on:  03/24/2017   nitroGLYCERIN 0.4 MG SL tablet Commonly known as:  NITROSTAT Place 0.4 mg under the tongue every 5 (five) minutes as needed for chest pain. Reported on 10/19/2015   oxybutynin 5 MG 24 hr tablet Commonly known as:  DITROPAN-XL Take 1 tablet (5 mg total) by mouth at bedtime. What changed:    when to take this  reasons to take this   polyethylene glycol packet Commonly known as:  MIRALAX / GLYCOLAX Take 17 g by mouth daily.   predniSONE 10 MG (21) Tbpk tablet Commonly known as:  STERAPRED UNI-PAK 21 TAB Taper  By 10 mg daily   PROAIR HFA  108 (90 Base) MCG/ACT inhaler Generic drug:  albuterol Inhale 2 puffs into the lungs every 6 (six) hours as needed. For wheezing.   albuterol (2.5 MG/3ML) 0.083% nebulizer solution Commonly known as:  PROVENTIL Take 3 mLs (2.5 mg total) by nebulization every 4 (four) hours as needed for wheezing or shortness of breath.   psyllium 95 % Pack Commonly known as:  HYDROCIL/METAMUCIL Take 1 packet by mouth daily.   tiotropium 18 MCG inhalation capsule Commonly known as:  SPIRIVA Place 1 capsule (18 mcg total) into inhaler and inhale every evening.         Diet and Activity recommendation: See Discharge Instructions above   Consults obtained -urology, cardiology, physical therapy, case manager   Major procedures and Radiology Reports - PLEASE review detailed and final reports for all details, in brief -     No results found.  Micro Results    Recent Results (from the past 240 hour(s))  Urine culture     Status: Abnormal (Preliminary  result)   Collection Time: 03/21/17 12:12 AM  Result Value Ref Range Status   Specimen Description URINE, RANDOM  Final   Special Requests NONE  Final   Culture (A)  Final    >=100,000 COLONIES/mL SERRATIA MARCESCENS 40,000 COLONIES/mL ENTEROCOCCUS FAECALIS SUSCEPTIBILITIES TO FOLLOW Performed at [facility], [address] [place], [place] [address]    Report Status PENDING  Incomplete   Organism ID, Bacteria SERRATIA MARCESCENS (A)  Final      Susceptibility   Serratia marcescens - MIC*    CEFAZOLIN >=64 RESISTANT Resistant     CEFTRIAXONE <=1 SENSITIVE Sensitive     CIPROFLOXACIN <=0.25 SENSITIVE Sensitive     GENTAMICIN <=1 SENSITIVE Sensitive     NITROFURANTOIN 128 RESISTANT Resistant     TRIMETH/SULFA <=20 SENSITIVE Sensitive     * >=100,000 COLONIES/mL SERRATIA MARCESCENS  Blood culture (routine x 2)     Status: None (Preliminary result)   Collection Time: 03/21/17  1:05 AM  Result Value Ref Range Status   Specimen Description BLOOD RIGHT AC  Final   Special Requests   Final    BOTTLES DRAWN AEROBIC AND ANAEROBIC Blood Culture adequate volume   Culture NO GROWTH 2 DAYS  Final   Report Status PENDING  Incomplete  Blood culture (routine x 2)     Status: None (Preliminary result)   Collection Time: 03/21/17  1:05 AM  Result Value Ref Range Status   Specimen Description BLOOD LEFT ARM  Final   Special Requests   Final    BOTTLES DRAWN AEROBIC AND ANAEROBIC Blood Culture adequate volume   Culture NO GROWTH 2 DAYS  Final   Report Status PENDING  Incomplete       Today   Subjective:   Daniel BussingGeorge Enochs today has no headache,no chest abdominal pain,no new weakness tingling or numbness, feels much better wants to go home today.  Objective:   Blood pressure (!) 129/59, pulse 92, temperature (!) 97.5 F (36.4 C), temperature source Oral, resp. rate 17, height 5\' 6"  (1.676 m), weight 54.6 kg (120 lb 4.8 oz), SpO2 100 %.   Intake/Output Summary (Last 24 hours) at  03/23/2017 1555 Last data filed at 03/23/2017 1011 Gross per 24 hour  Intake 360 ml  Output 900 ml  Net -540 ml    Exam Awake Alert, Oriented x 3, No new F.N deficits, Normal affect [place].AT,PERRAL Supple Neck,No JVD, No cervical lymphadenopathy appriciated.  Symmetrical Chest wall movement, Good air  movement bilaterally, CTAB RRR,No Gallops,Rubs or new Murmurs, No Parasternal Heave +ve B.Sounds, Abd Soft, Non tender, No organomegaly appriciated, No rebound -guarding or rigidity. No Cyanosis, Clubbing or edema, No new Rash or bruise  Data Review   CBC w Diff:  Lab Results  Component Value Date   WBC 4.3 03/22/2017   HGB 11.4 (L) 03/22/2017   HGB 15.6 02/06/2014   HCT 33.9 (L) 03/22/2017   HCT 47.3 02/06/2014   PLT 171 03/22/2017   PLT 143 (L) 02/06/2014   LYMPHOPCT 16 03/21/2017   MONOPCT 7 03/21/2017   EOSPCT 4 03/21/2017   BASOPCT 1 03/21/2017    CMP:  Lab Results  Component Value Date   NA 135 03/22/2017   NA 139 02/06/2014   K 4.2 03/22/2017   K 3.9 02/06/2014   CL 102 03/22/2017   CL 104 02/06/2014   CO2 26 03/22/2017   CO2 26 02/06/2014   BUN 30 (H) 03/22/2017   BUN 29 (H) 02/06/2014   CREATININE 0.96 03/22/2017   CREATININE 1.61 (H) 02/06/2014   PROT 6.3 (L) 03/04/2017   PROT 7.4 02/06/2014   ALBUMIN 3.1 (L) 03/04/2017   ALBUMIN 3.9 02/06/2014   BILITOT 0.9 03/04/2017   BILITOT 0.6 02/06/2014   ALKPHOS 41 03/04/2017   ALKPHOS 44 (L) 02/06/2014   AST 27 03/04/2017   AST 32 02/06/2014   ALT 20 03/04/2017   ALT 27 02/06/2014  .   Total Time in preparing paper work, data evaluation and todays exam - 35 minutes  Katha Hamming M.D on 03/23/2017 at 3:55 PM    Note: This dictation was prepared with Dragon dictation along with smaller phrase technology. Any transcriptional errors that result from this process are unintentional.

## 2017-03-23 NOTE — Care Management Obs Status (Signed)
MEDICARE OBSERVATION STATUS NOTIFICATION   Patient Details  Name: Daniel Dickson MRN: [id number] Date of Birth: 01/25/1913   Medicare Observation Status Notification Given:  Yes    Chapman FitchBOWEN, Haydin Dunn T, RN 03/23/2017, 11:00 AM

## 2017-03-24 LAB — URINE CULTURE: Culture: >=100000 — AB

## 2017-03-26 LAB — CULTURE, BLOOD (ROUTINE X 2)
CULTURE: NO GROWTH
CULTURE: NO GROWTH
SPECIAL REQUESTS: ADEQUATE
SPECIAL REQUESTS: ADEQUATE

## 2017-04-02 ENCOUNTER — Emergency Department: Payer: Medicare Other

## 2017-04-02 ENCOUNTER — Inpatient Hospital Stay
Admission: EM | Admit: 2017-04-02 | Discharge: 2017-04-05 | DRG: 193 | Disposition: A | Discharge status: Home / Self Care | Payer: Medicare Other | Attending: Internal Medicine | Admitting: Internal Medicine

## 2017-04-02 ENCOUNTER — Encounter: Payer: Self-pay | Admitting: Emergency Medicine

## 2017-04-02 ENCOUNTER — Other Ambulatory Visit: Payer: Self-pay

## 2017-04-02 DIAGNOSIS — I255 Ischemic cardiomyopathy: Secondary | ICD-10-CM | POA: Diagnosis present

## 2017-04-02 DIAGNOSIS — Z9849 Cataract extraction status, unspecified eye: Secondary | ICD-10-CM | POA: Diagnosis not present

## 2017-04-02 DIAGNOSIS — R0600 Dyspnea, unspecified: Secondary | ICD-10-CM

## 2017-04-02 DIAGNOSIS — Z66 Do not resuscitate: Secondary | ICD-10-CM | POA: Diagnosis present

## 2017-04-02 DIAGNOSIS — I248 Other forms of acute ischemic heart disease: Secondary | ICD-10-CM | POA: Diagnosis present

## 2017-04-02 DIAGNOSIS — J189 Pneumonia, unspecified organism: Secondary | ICD-10-CM | POA: Diagnosis not present

## 2017-04-02 DIAGNOSIS — F4310 Post-traumatic stress disorder, unspecified: Secondary | ICD-10-CM | POA: Diagnosis present

## 2017-04-02 DIAGNOSIS — J9621 Acute and chronic respiratory failure with hypoxia: Secondary | ICD-10-CM | POA: Diagnosis present

## 2017-04-02 DIAGNOSIS — R1314 Dysphagia, pharyngoesophageal phase: Secondary | ICD-10-CM | POA: Diagnosis present

## 2017-04-02 DIAGNOSIS — I110 Hypertensive heart disease with heart failure: Secondary | ICD-10-CM | POA: Diagnosis present

## 2017-04-02 DIAGNOSIS — Z9861 Coronary angioplasty status: Secondary | ICD-10-CM | POA: Diagnosis not present

## 2017-04-02 DIAGNOSIS — I2510 Atherosclerotic heart disease of native coronary artery without angina pectoris: Secondary | ICD-10-CM | POA: Diagnosis present

## 2017-04-02 DIAGNOSIS — Z825 Family history of asthma and other chronic lower respiratory diseases: Secondary | ICD-10-CM

## 2017-04-02 DIAGNOSIS — Z9049 Acquired absence of other specified parts of digestive tract: Secondary | ICD-10-CM

## 2017-04-02 DIAGNOSIS — Z86718 Personal history of other venous thrombosis and embolism: Secondary | ICD-10-CM | POA: Diagnosis not present

## 2017-04-02 DIAGNOSIS — Z681 Body mass index (BMI) 19 or less, adult: Secondary | ICD-10-CM

## 2017-04-02 DIAGNOSIS — E43 Unspecified severe protein-calorie malnutrition: Secondary | ICD-10-CM | POA: Diagnosis present

## 2017-04-02 DIAGNOSIS — Z7982 Long term (current) use of aspirin: Secondary | ICD-10-CM

## 2017-04-02 DIAGNOSIS — J440 Chronic obstructive pulmonary disease with acute lower respiratory infection: Secondary | ICD-10-CM | POA: Diagnosis present

## 2017-04-02 DIAGNOSIS — I5022 Chronic systolic (congestive) heart failure: Secondary | ICD-10-CM | POA: Diagnosis present

## 2017-04-02 DIAGNOSIS — N400 Enlarged prostate without lower urinary tract symptoms: Secondary | ICD-10-CM | POA: Diagnosis present

## 2017-04-02 DIAGNOSIS — I252 Old myocardial infarction: Secondary | ICD-10-CM | POA: Diagnosis not present

## 2017-04-02 DIAGNOSIS — Z9981 Dependence on supplemental oxygen: Secondary | ICD-10-CM

## 2017-04-02 DIAGNOSIS — Z87891 Personal history of nicotine dependence: Secondary | ICD-10-CM

## 2017-04-02 DIAGNOSIS — J441 Chronic obstructive pulmonary disease with (acute) exacerbation: Secondary | ICD-10-CM | POA: Diagnosis not present

## 2017-04-02 LAB — CBC WITH DIFFERENTIAL/PLATELET
BASOS ABS: 0.1 10*3/uL (ref 0–0.1)
Basophils Relative: 1 %
EOS PCT: 2 %
Eosinophils Absolute: 0.2 10*3/uL (ref 0–0.7)
HCT: 34.3 % — ABNORMAL LOW (ref 40.0–52.0)
Hemoglobin: 11.3 g/dL — ABNORMAL LOW (ref 13.0–18.0)
LYMPHS ABS: 1.3 10*3/uL (ref 1.0–3.6)
LYMPHS PCT: 10 %
MCH: 32.2 pg (ref 26.0–34.0)
MCHC: 33 g/dL (ref 32.0–36.0)
MCV: 97.5 fL (ref 80.0–100.0)
MONO ABS: 0.9 10*3/uL (ref 0.2–1.0)
MONOS PCT: 7 %
Neutro Abs: 9.8 10*3/uL — ABNORMAL HIGH (ref 1.4–6.5)
Neutrophils Relative %: 80 %
PLATELETS: 167 10*3/uL (ref 150–440)
RBC: 3.52 MIL/uL — ABNORMAL LOW (ref 4.40–5.90)
RDW: 16.1 % — AB (ref 11.5–14.5)
WBC: 12.3 10*3/uL — ABNORMAL HIGH (ref 3.8–10.6)

## 2017-04-02 LAB — COMPREHENSIVE METABOLIC PANEL
ALK PHOS: 45 U/L (ref 38–126)
ALT: 21 U/L (ref 17–63)
AST: 32 U/L (ref 15–41)
Albumin: 2.6 g/dL — ABNORMAL LOW (ref 3.5–5.0)
Anion gap: 7 (ref 5–15)
BUN: 32 mg/dL — ABNORMAL HIGH (ref 6–20)
CALCIUM: 8.7 mg/dL — AB (ref 8.9–10.3)
CO2: 26 mmol/L (ref 22–32)
CREATININE: 1.22 mg/dL (ref 0.61–1.24)
Chloride: 100 mmol/L — ABNORMAL LOW (ref 101–111)
GFR calc non Af Amer: 46 mL/min — ABNORMAL LOW (ref >60)
GFR, EST AFRICAN AMERICAN: 53 mL/min — AB (ref >60)
Glucose, Bld: 118 mg/dL — ABNORMAL HIGH (ref 65–99)
Potassium: 3.9 mmol/L (ref 3.5–5.1)
SODIUM: 133 mmol/L — AB (ref 135–145)
Total Bilirubin: 0.8 mg/dL (ref 0.3–1.2)
Total Protein: 5.5 g/dL — ABNORMAL LOW (ref 6.5–8.1)

## 2017-04-02 LAB — TROPONIN I
TROPONIN I: 0.5 ng/mL — AB (ref <0.03)
TROPONIN I: 0.5 ng/mL — AB (ref <0.03)
Troponin I: 0.41 ng/mL (ref <0.03)

## 2017-04-02 LAB — BRAIN NATRIURETIC PEPTIDE: B Natriuretic Peptide: 101 pg/mL — ABNORMAL HIGH (ref 0.0–100.0)

## 2017-04-02 MED ORDER — CEFTRIAXONE SODIUM IN DEXTROSE 20 MG/ML IV SOLN: 1.0000 g | INTRAVENOUS | Status: DC

## 2017-04-02 MED ORDER — DEXTROSE 5 % IV SOLN
500.0000 mg | INTRAVENOUS | Status: DC
  Administered 2017-04-02 – 2017-04-04 (×3): 500 mg via INTRAVENOUS
  Filled 2017-04-02 (×4): qty 500

## 2017-04-02 MED ORDER — ASPIRIN 81 MG PO CHEW
81.0000 mg | CHEWABLE_TABLET | Freq: Every day | ORAL | Status: DC
  Administered 2017-04-03 – 2017-04-05 (×3): 81 mg via ORAL
  Filled 2017-04-02 (×3): qty 1

## 2017-04-02 MED ORDER — PREDNISONE 10 MG (21) PO TBPK: ORAL_TABLET | Freq: Every day | ORAL | Status: DC

## 2017-04-02 MED ORDER — HYDROCORTISONE 2.5 % RE CREA
TOPICAL_CREAM | Freq: Two times a day (BID) | RECTAL | Status: DC
  Administered 2017-04-03 – 2017-04-04 (×2): via TOPICAL
  Filled 2017-04-02: qty 28.35

## 2017-04-02 MED ORDER — HYDRALAZINE HCL 20 MG/ML IJ SOLN: 10.0000 mg | INTRAMUSCULAR | Status: DC | PRN

## 2017-04-02 MED ORDER — ACETAMINOPHEN 325 MG PO TABS
650.0000 mg | ORAL_TABLET | Freq: Four times a day (QID) | ORAL | Status: DC | PRN
  Administered 2017-04-02: 650 mg via ORAL
  Filled 2017-04-02: qty 2

## 2017-04-02 MED ORDER — PSYLLIUM 95 % PO PACK
1.0000 | PACK | Freq: Every day | ORAL | Status: DC | PRN
  Filled 2017-04-02: qty 1

## 2017-04-02 MED ORDER — SODIUM CHLORIDE 0.9 % IV SOLN: 250.0000 mL | INTRAVENOUS | Status: DC | PRN

## 2017-04-02 MED ORDER — DEXTROSE 5 % IV SOLN
1.0000 g | INTRAVENOUS | Status: DC
  Administered 2017-04-02 – 2017-04-04 (×3): 1 g via INTRAVENOUS
  Filled 2017-04-02 (×4): qty 10

## 2017-04-02 MED ORDER — ADULT MULTIVITAMIN W/MINERALS CH
1.0000 | ORAL_TABLET | Freq: Every day | ORAL | Status: DC
  Administered 2017-04-03 – 2017-04-05 (×3): 1 via ORAL
  Filled 2017-04-02 (×3): qty 1

## 2017-04-02 MED ORDER — HEPARIN SODIUM (PORCINE) 5000 UNIT/ML IJ SOLN
5000.0000 [IU] | Freq: Three times a day (TID) | INTRAMUSCULAR | Status: DC
  Administered 2017-04-02 – 2017-04-05 (×8): 5000 [IU] via SUBCUTANEOUS
  Filled 2017-04-02 (×8): qty 1

## 2017-04-02 MED ORDER — BOOST / RESOURCE BREEZE PO LIQD
1.0000 | Freq: Three times a day (TID) | ORAL | Status: DC
  Administered 2017-04-02: 1 via ORAL
  Filled 2017-04-02 (×4): qty 1

## 2017-04-02 MED ORDER — SODIUM CHLORIDE 0.9 % IV SOLN
Freq: Once | INTRAVENOUS | Status: AC
  Administered 2017-04-02: 15:00:00 via INTRAVENOUS

## 2017-04-02 MED ORDER — SODIUM CHLORIDE 0.9% FLUSH: 3.0000 mL | INTRAVENOUS | Status: DC | PRN

## 2017-04-02 MED ORDER — OXYBUTYNIN CHLORIDE ER 5 MG PO TB24
5.0000 mg | ORAL_TABLET | Freq: Every day | ORAL | Status: DC
  Administered 2017-04-02 – 2017-04-04 (×3): 5 mg via ORAL
  Filled 2017-04-02 (×4): qty 1

## 2017-04-02 MED ORDER — SODIUM CHLORIDE 0.9% FLUSH
3.0000 mL | Freq: Two times a day (BID) | INTRAVENOUS | Status: DC
  Administered 2017-04-02 – 2017-04-05 (×6): 3 mL via INTRAVENOUS

## 2017-04-02 MED ORDER — POLYETHYLENE GLYCOL 3350 17 G PO PACK: 17.0000 g | PACK | Freq: Every day | ORAL | Status: DC | PRN

## 2017-04-02 MED ORDER — FUROSEMIDE 40 MG PO TABS
20.0000 mg | ORAL_TABLET | Freq: Every day | ORAL | Status: DC
  Administered 2017-04-03 – 2017-04-05 (×3): 20 mg via ORAL
  Filled 2017-04-02 (×3): qty 1

## 2017-04-02 MED ORDER — NITROGLYCERIN 0.4 MG SL SUBL: 0.4000 mg | SUBLINGUAL_TABLET | SUBLINGUAL | Status: DC | PRN

## 2017-04-02 MED ORDER — IPRATROPIUM-ALBUTEROL 0.5-2.5 (3) MG/3ML IN SOLN
3.0000 mL | Freq: Four times a day (QID) | RESPIRATORY_TRACT | Status: DC
  Administered 2017-04-02 – 2017-04-03 (×2): 3 mL via RESPIRATORY_TRACT
  Filled 2017-04-02: qty 3

## 2017-04-02 MED ORDER — MAGNESIUM HYDROXIDE 400 MG/5ML PO SUSP: 30.0000 mL | Freq: Two times a day (BID) | ORAL | Status: DC | PRN

## 2017-04-02 NOTE — ED Notes (Signed)
Patient transported to 259 

## 2017-04-02 NOTE — ED Notes (Signed)
Pt c/o penile pain per pt report. Malinda MR notified and assessed at bedside, Stool noted in diaper. Pt cleaned and changed. Catheter intact. Unblanchable redness noted to sacrum. Pt placed on 02 @3L . Pt states wears 02 2-4L at home.

## 2017-04-02 NOTE — H&P (Signed)
[employer] [facility] [place] [facility]   PATIENT NAME: Daniel Dickson    MR#:  [id number]  DATE OF BIRTH:  January 05, 1913  DATE OF ADMISSION:  04/02/2017  PRIMARY CARE PHYSICIAN: [name], MD   REQUESTING/REFERRING PHYSICIAN:   CHIEF COMPLAINT:   Chief Complaint  Patient presents with  . Shortness of Breath    HISTORY OF PRESENT ILLNESS: Daniel Dickson  is a 83 y.o. male with a known history per below which includes chronic hypoxic respiratory failure on 2 L via nasal cannula at home, COPD, systolic congestive heart failure with ejection fraction 40-45%, presents emergency room with acute on chronic shortness of breath, mild chest tightness with deep breathing, no better with breathing treatments at home, subjective fevers, chills, nonproductive cough, workup in the emergency room noted for bilateral small pleural effusions/possible pneumonia as well as COPD changes, sodium 133, Storlie count 12,000, troponin 0.5-trended down to 0.4, noted history of chronic elevated troponins, patient evaluated emergency room, no apparent distress, resting comfortably in bed, patient is now been admitted for acute probable community-acquired pneumonia.  PAST MEDICAL HISTORY:   Past Medical History:  Diagnosis Date  . Anxiety   . BPH (benign prostatic hyperplasia)    a. complicated by urinary retention req chronic foley.  . Cellulitis    a. 10/2016 - Right leg.  . Chronic systolic CHF (congestive heart failure) (HCC)    a. 02/2016 Echo: EF 40-45%, no rwma, mildly dil LA.  Marland Kitchen Chronic Urinary retention    a. Chronic indwelling foley.  Marland Kitchen COPD (chronic obstructive pulmonary disease) (HCC)    a. On chronic supplemental O2 via [place].  Marland Kitchen Coronary artery disease    a. 03/2011 Inflat STEMI/PCI ([facility]): LM nl, LAD 50p, LCX 65m, RI nl, RCA 100p (2.75x22 MDT Integrity BMS)-->post procedure course complicated by RFV DVT req coumadin and then R SFA PSA & hematoma req surgical evacuation.  . DVT (deep venous  thrombosis) (HCC)    a. 03/2011 R Femoral Vein DVT following Cath/PCI.  Marland Kitchen Hematuria   . Hemorrhoids   . History of GI diverticular bleed    a. 02/2017 - rectal bleeding felt to be diverticular-->colonoscopy deferrred.  . Hypertensive heart disease   . Ischemic cardiomyopathy    a. 02/2016 Echo: EF 40-45%.  . Mixed hyperlipidemia   . PTSD (post-traumatic stress disorder)    with headaches  . Right SFA Pseudoaneurysm (HCC)    a. 03/2011 following Cath/PCI-->complicated by hematoma req surgical evacuation.    PAST SURGICAL HISTORY:  Past Surgical History:  Procedure Laterality Date  . CATARACT EXTRACTION    . CHOLECYSTECTOMY    . CORONARY ANGIOPLASTY     with stent; [facility]  . HAND SURGERY    . HEMORRHOID SURGERY    . HIP SURGERY    . VASCULAR SURGERY     stent R femoral artery    SOCIAL HISTORY:  Social History   Tobacco Use  . Smoking status: Former Smoker    Packs/day: 3.00    Years: 40.00    Pack years: 120.00    Types: Cigarettes  . Smokeless tobacco: Never Used  . Tobacco comment: quit 62 years  Substance Use Topics  . Alcohol use: Yes    Alcohol/week: 1.8 oz    Types: 3 Glasses of wine per week    Comment: glass of wine at night    FAMILY HISTORY:  Family History  Problem Relation Age of Onset  . COPD Father   .  Bladder Cancer Neg Hx   . Kidney cancer Neg Hx   . Prostate cancer Neg Hx     DRUG ALLERGIES:  Allergies  Allergen Reactions  . Iodine (Kelp) [Iodine] Nausea And Vomiting    REVIEW OF SYSTEMS:   CONSTITUTIONAL: Subjective fever, chronic fatigue/generalized weakness.  EYES: No blurred or double vision.  EARS, NOSE, AND THROAT: No tinnitus or ear pain.  RESPIRATORY: Nonproductive  cough, shortness of breath, intermittent wheezing, no hemoptysis.  CARDIOVASCULAR: No chest pain, orthopnea, edema.  GASTROINTESTINAL: No nausea, vomiting, diarrhea or abdominal pain.  GENITOURINARY: No dysuria, hematuria.  ENDOCRINE: No polyuria, nocturia,   HEMATOLOGY: No anemia, easy bruising or bleeding SKIN: No rash or lesion. MUSCULOSKELETAL: No joint pain or arthritis.   NEUROLOGIC: No tingling, numbness, weakness.  PSYCHIATRY: No anxiety or depression.   MEDICATIONS AT HOME:  Prior to Admission medications   Medication Sig Start Date End Date Taking? Authorizing Provider  acetaminophen (TYLENOL) 500 MG tablet Take 500 mg by mouth daily as needed for mild pain or moderate pain.     [provider]  albuterol (PROAIR HFA) 108 (90 BASE) MCG/ACT inhaler Inhale 2 puffs into the lungs every 6 (six) hours as needed. For wheezing.    [provider]  albuterol (PROVENTIL) (2.5 MG/3ML) 0.083% nebulizer solution Take 3 mLs (2.5 mg total) by nebulization every 4 (four) hours as needed for wheezing or shortness of breath. 09/28/16   Milagros LollSudini, Srikar, MD  aspirin (ASPIRIN CHILDRENS) 81 MG chewable tablet Chew 1 tablet (81 mg total) by mouth daily. 03/23/17 03/23/18  Katha HammingKonidena, Snehalatha, MD  feeding supplement (BOOST / RESOURCE BREEZE) LIQD Take 1 Container by mouth 3 (three) times daily between meals. 11/02/15   Katharina CaperVaickute, Rima, MD  furosemide (LASIX) 40 MG tablet Take 0.5 tablets (20 mg total) by mouth daily. 01/04/17   Houston SirenSainani, Vivek J, MD  hydrocortisone (ANUSOL-HC) 2.5 % rectal cream Apply topically 2 (two) times daily. 03/23/17   Katha HammingKonidena, Snehalatha, MD  ipratropium-albuterol (DUONEB) 0.5-2.5 (3) MG/3ML SOLN Take 3 mLs by nebulization every 4 (four) hours. 03/23/17   Katha HammingKonidena, Snehalatha, MD  magnesium hydroxide (MILK OF MAGNESIA) 400 MG/5ML suspension Take 30 mLs by mouth 2 (two) times daily as needed for mild constipation or moderate constipation. 11/02/15   Katharina CaperVaickute, Rima, MD  Multiple Vitamin (MULTIVITAMIN WITH MINERALS) TABS tablet Take 1 tablet by mouth daily. 03/24/17   Katha HammingKonidena, Snehalatha, MD  nitroGLYCERIN (NITROSTAT) 0.4 MG SL tablet Place 0.4 mg under the tongue every 5 (five) minutes as needed for chest pain. Reported on  10/19/2015    [provider]  oxybutynin (DITROPAN-XL) 5 MG 24 hr tablet Take 1 tablet (5 mg total) by mouth at bedtime. 03/23/17   Katha HammingKonidena, Snehalatha, MD  polyethylene glycol (MIRALAX / GLYCOLAX) packet Take 17 g by mouth daily. 03/05/17   Adrian SaranMody, Sital, MD  predniSONE (STERAPRED UNI-PAK 21 TAB) 10 MG (21) TBPK tablet Taper  By 10 mg daily 03/23/17   Katha HammingKonidena, Snehalatha, MD  psyllium (HYDROCIL/METAMUCIL) 95 % PACK Take 1 packet by mouth daily. 03/05/17   Adrian SaranMody, Sital, MD  tiotropium (SPIRIVA) 18 MCG inhalation capsule Place 1 capsule (18 mcg total) into inhaler and inhale every evening. 10/09/15 01/14/18  Adrian SaranMody, Sital, MD      PHYSICAL EXAMINATION:   VITAL SIGNS: Blood pressure (!) 155/91, pulse 99, temperature (!) 97.5 F (36.4 C), temperature source Oral, resp. rate (!) 21, height 5\' 7"  (1.702 m), weight 54.4 kg (120 lb), SpO2 100 %.  GENERAL:  [age] y.o.-year-old patient lying in the bed with no acute distress.  Malnourished appearance EYES: Pupils equal, round, reactive to light and accommodation. No scleral icterus. Extraocular muscles intact.  HEENT: Head atraumatic, normocephalic. Oropharynx and nasopharynx clear.  NECK:  Supple, no jugular venous distention. No thyroid enlargement, no tenderness.  LUNGS: Coarse breath sounds bilaterally.  No use of accessory muscles of respiration.  CARDIOVASCULAR: S1, S2 normal. No murmurs, rubs, or gallops.  ABDOMEN: Soft, nontender, nondistended. Bowel sounds present. No organomegaly or mass.  EXTREMITIES: No pedal edema, cyanosis, or clubbing.  Profound muscular wasting diffusely NEUROLOGIC: Cranial nerves II through XII are intact. MAES. Gait not checked.  PSYCHIATRIC: The patient is alert and oriented x 3.  SKIN: No obvious rash, lesion, or ulcer.   LABORATORY PANEL:   CBC Recent Labs  Lab 04/02/17 1505  WBC 12.3*  HGB 11.3*  HCT 34.3*  PLT 167  MCV 97.5  MCH 32.2  MCHC 33.0  RDW 16.1*  LYMPHSABS 1.3  MONOABS 0.9  EOSABS  0.2  BASOSABS 0.1   ------------------------------------------------------------------------------------------------------------------  Chemistries  Recent Labs  Lab 04/02/17 1505  NA 133*  K 3.9  CL 100*  CO2 26  GLUCOSE 118*  BUN 32*  CREATININE 1.22  CALCIUM 8.7*  AST 32  ALT 21  ALKPHOS 45  BILITOT 0.8   ------------------------------------------------------------------------------------------------------------------ estimated creatinine clearance is 23.5 mL/min (by C-G formula based on SCr of 1.22 mg/dL). ------------------------------------------------------------------------------------------------------------------ No results for input(s): TSH, T4TOTAL, T3FREE, THYROIDAB in the last 72 hours.  Invalid input(s): FREET3   Coagulation profile No results for input(s): INR, PROTIME in the last 168 hours. ------------------------------------------------------------------------------------------------------------------- No results for input(s): DDIMER in the last 72 hours. -------------------------------------------------------------------------------------------------------------------  Cardiac Enzymes Recent Labs  Lab 04/02/17 1505 04/02/17 1711  TROPONINI 0.50* 0.41*   ------------------------------------------------------------------------------------------------------------------ Invalid input(s): POCBNP  ---------------------------------------------------------------------------------------------------------------  Urinalysis    Component Value Date/Time   COLORURINE YELLOW (A) 03/21/2017 0012   APPEARANCEUR HAZY (A) 03/21/2017 0012   LABSPEC 1.020 03/21/2017 0012   PHURINE 6.0 03/21/2017 0012   GLUCOSEU NEGATIVE 03/21/2017 0012   HGBUR SMALL (A) 03/21/2017 0012   BILIRUBINUR NEGATIVE 03/21/2017 0012   KETONESUR NEGATIVE 03/21/2017 0012   PROTEINUR 100 (A) 03/21/2017 0012   NITRITE POSITIVE (A) 03/21/2017 0012   LEUKOCYTESUR MODERATE (A) 03/21/2017  0012     RADIOLOGY: Dg Chest Portable 1 View  Result Date: 04/02/2017 CLINICAL DATA:  Initial evaluation for acute shortness of breath. EXAM: PORTABLE CHEST 1 VIEW COMPARISON:  None. FINDINGS: Cardiac and mediastinal silhouettes are stable in size and contour, and remain within normal limits. Aortic atherosclerosis. Lungs normally inflated. Underlying COPD. Small layering bilateral pleural effusions. Associated bibasilar opacities likely atelectasis, although infiltrates could be considered as well. No pulmonary edema. No pneumothorax. No acute osseus abnormality. IMPRESSION: 1. Small layering bilateral pleural effusions with associated bibasilar opacities, likely atelectasis. Infiltrates could be considered in the correct clinical setting. 2. Underlying COPD. 3. Aortic atherosclerosis. Electronically Signed   By: Rise MuBenjamin  McClintock M.D.   On: 04/02/2017 15:52   Dg Shoulder Left  Result Date: 04/02/2017 CLINICAL DATA:  Initial evaluation for acute left shoulder pain. No injury. EXAM: LEFT SHOULDER - 2+ VIEW COMPARISON:  None. FINDINGS: No acute fracture or dislocation. Humeral head in normal line with the glenoid. AC joint approximated. Heterotopic calcification at the superolateral aspect of the humeral head consistent with calcific tendinopathy. Osteoarthritic changes about the [facility] as well. Bones are mildly osteopenic. No soft tissue abnormality. IMPRESSION: 1. No acute osseous  abnormality about the left shoulder. 2. Heterotopic calcification at the superolateral aspect of the left femoral head, consistent with underlying calcific tendinopathy. 3. Mild right acromioclavicular osteoarthrosis. Electronically Signed   By: Rise Mu M.D.   On: 04/02/2017 15:50    EKG: Orders placed or performed during the hospital encounter of 04/02/17  . ED EKG  . ED EKG    IMPRESSION AND PLAN: 1 acute probable community-acquired pneumonia Admit to regular nursing floor bed on our pneumonia  protocol, empiric Rocephin/azithromycin, supplemental oxygen as needed, follow-up on cultures  2 acute on COPD At baseline Continue home regiment  3 acute on chronically elevated troponins Most likely secondary to chronic heart failure and COPD with possible mild demand ischemia due to pneumonia  4 acute on chronic hypoxic respiratory failure Stable Continue home regiment-on 2 L via nasal cannula at home continuous  5 chronic protein energy/calorie malnutrition Dietary/nutritionist consulted for expert opinion  Full code Condition stable Prognosis poor DVT prophylaxis with Lovenox subcu Disposition to home 1-3 days barring any complications    All the records are reviewed and case discussed with ED provider. Management plans discussed with the patient, family and they are in agreement.  CODE STATUS: Code Status History    Date Active Date Inactive Code Status Order ID Comments User Context   03/21/2017 03:02 03/23/2017 17:35 Full Code [id number]  Bertrum Sol, MD Inpatient   03/04/2017 10:38 03/05/2017 17:53 Full Code [id number]  Adrian Saran, MD Inpatient   03/04/2017 09:59 03/04/2017 10:38 DNR [id number]  Adrian Saran, MD Inpatient   01/20/2017 04:46 01/21/2017 16:22 Full Code [id number]  Tonye Royalty, DO Inpatient   01/02/2017 19:37 01/03/2017 17:59 DNR [id number]  Milagros Loll, MD ED   11/12/2016 09:32 11/13/2016 15:37 Full Code [id number]  Gasper Lloyd, RN Inpatient   11/11/2016 11:23 11/12/2016 09:32 DNR [id number]  Enedina Finner, MD Inpatient   10/18/2016 20:36 10/20/2016 19:52 DNR [id number]  Milagros Loll, MD ED   09/27/2016 15:00 09/28/2016 17:19 DNR [id number]  Altamese Dilling, MD Inpatient   09/24/2016 08:52 09/27/2016 15:00 Full Code [id number]  Enid Baas, MD Inpatient   08/30/2016 05:16 09/02/2016 16:04 Full Code [id number]  Arnaldo Natal, MD Inpatient   02/18/2016 08:27 02/22/2016 18:50 Full Code [id number]  Enedina Finner, MD Inpatient   10/29/2015 21:19  11/02/2015 14:22 Full Code [id number]  Enid Baas, MD Inpatient   10/07/2015 07:32 10/09/2015 16:29 Full Code [id number]  Ihor Austin, MD Inpatient   06/07/2015 00:04 06/10/2015 16:31 Full Code [id number]  Oralia Manis, MD Inpatient   01/11/2015 13:54 01/12/2015 13:58 Full Code [id number]  Alford Highland, MD ED       TOTAL TIME TAKING CARE OF THIS PATIENT: 40 minutes.    Evelena Asa Marchello Rothgeb M.D on 04/02/2017   Between 7am to 6pm - Pager - [phone]  After 6pm go to [email or web address] - password [email or web address]  [facility]  Office  [phone]  CC: Primary care physician; [name], MD   Note: This dictation was prepared with Dragon dictation along with smaller phrase technology. Any transcriptional errors that result from this process are unintentional.

## 2017-04-02 NOTE — ED Notes (Signed)
X-ray at bedside

## 2017-04-02 NOTE — ED Triage Notes (Signed)
Pt arrives to ER via EMS with complaints of SOB. Pt took one of his duonebs at home with no relief called EMS they gave him a duoneb and pt states that he feels much better.

## 2017-04-02 NOTE — ED Provider Notes (Signed)
[facility] Emergency Department Provider Note   ____________________________________________   First MD Initiated Contact with Patient 04/02/17 1457     (approximate)  I have reviewed the triage vital signs and the nursing notes.   HISTORY  Chief Complaint Shortness of Breath  HPI Daniel Dickson is a [age] y.o. male Who has a history of COPD. He reports he took one of his DuoNeb nebs at home with no relief or shortness of breath got one in with EMS he fells somewhat better.he is now able talk some he has less shortness of breath. He says he hasn't been drinking very much lately. Urine is fairly dark.he is not coughing now has no fever he is able to talk in 3-4 word sentences   Past Medical History:  Diagnosis Date  . Anxiety   . BPH (benign prostatic hyperplasia)    a. complicated by urinary retention req chronic foley.  . Cellulitis    a. 10/2016 - Right leg.  . Chronic systolic CHF (congestive heart failure) (HCC)    a. 02/2016 Echo: EF 40-45%, no rwma, mildly dil LA.  Marland Kitchen. Chronic Urinary retention    a. Chronic indwelling foley.  Marland Kitchen. COPD (chronic obstructive pulmonary disease) (HCC)    a. On chronic supplemental O2 via [place].  Marland Kitchen. Coronary artery disease    a. 03/2011 Inflat STEMI/PCI ([facility]): LM nl, LAD 50p, LCX 5671m, RI nl, RCA 100p (2.75x22 MDT Integrity BMS)-->post procedure course complicated by RFV DVT req coumadin and then R SFA PSA & hematoma req surgical evacuation.  . DVT (deep venous thrombosis) (HCC)    a. 03/2011 R Femoral Vein DVT following Cath/PCI.  Marland Kitchen. Hematuria   . Hemorrhoids   . History of GI diverticular bleed    a. 02/2017 - rectal bleeding felt to be diverticular-->colonoscopy deferrred.  . Hypertensive heart disease   . Ischemic cardiomyopathy    a. 02/2016 Echo: EF 40-45%.  . Mixed hyperlipidemia   . PTSD (post-traumatic stress disorder)    with headaches  . Right SFA Pseudoaneurysm (HCC)    a. 03/2011 following  Cath/PCI-->complicated by hematoma req surgical evacuation.    Patient Active Problem List   Diagnosis Date Noted  . Elevated troponin 03/21/2017  . Hematuria 03/21/2017  . Demand ischemia (HCC)   . GIB (gastrointestinal bleeding) 03/04/2017  . Shortness of breath 01/20/2017  . CHF (congestive heart failure) (HCC) 01/02/2017  . Acute on chronic respiratory failure (HCC) 11/11/2016  . Open wound of lower limb with tendon involvement, right, subsequent encounter   . Cellulitis of right leg 10/18/2016  . DNR (do not resuscitate) discussion   . Palliative care by specialist   . Adult failure to thrive   . Gross hematuria   . Acute respiratory failure (HCC) 02/18/2016  . Urinary retention 11/10/2015  . Hyperkalemia 11/02/2015  . Hypotension 11/02/2015  . Dysphagia, pharyngoesophageal phase 11/02/2015  . Esophageal spasm 11/02/2015  . Constipation 11/02/2015  . SIADH (syndrome of inappropriate ADH production) (HCC) 11/02/2015  . Urinary retention due to benign prostatic hyperplasia 11/02/2015  . Chronic indwelling Foley catheter 11/02/2015  . Urinary tract infection with hematuria 11/02/2015  . Protein-calorie malnutrition, severe 10/30/2015  . ARF (acute renal failure) (HCC) 10/29/2015  . Dyspnea 10/07/2015  . Hyponatremia 06/06/2015  . Coronary artery disease   . Hypertensive heart disease   . Mixed hyperlipidemia   . COPD (chronic obstructive pulmonary disease) (HCC)   . COPD exacerbation (HCC) 01/11/2015  . Moderate COPD (chronic  obstructive pulmonary disease) (HCC) 12/08/2014  . Fall 09/03/2014  . Hip fracture (HCC) 09/03/2014  . PTSD (post-traumatic stress disorder) 09/03/2014  . Bilateral leg edema 09/03/2014  . SOB (shortness of breath) 10/31/2012  . Essential hypertension 10/31/2012  . CAD (coronary artery disease) 10/31/2012  . Hyperlipidemia 10/31/2012  . Tachycardia 10/31/2012    Past Surgical History:  Procedure Laterality Date  . CATARACT EXTRACTION    .  CHOLECYSTECTOMY    . CORONARY ANGIOPLASTY     with stent; [facility]  . HAND SURGERY    . HEMORRHOID SURGERY    . HIP SURGERY    . VASCULAR SURGERY     stent R femoral artery    Prior to Admission medications   Medication Sig Start Date End Date Taking? Authorizing Provider  acetaminophen (TYLENOL) 500 MG tablet Take 500 mg by mouth daily as needed for mild pain or moderate pain.     [provider]  albuterol (PROAIR HFA) 108 (90 BASE) MCG/ACT inhaler Inhale 2 puffs into the lungs every 6 (six) hours as needed. For wheezing.    [provider]  albuterol (PROVENTIL) (2.5 MG/3ML) 0.083% nebulizer solution Take 3 mLs (2.5 mg total) by nebulization every 4 (four) hours as needed for wheezing or shortness of breath. 09/28/16   Milagros Loll, MD  aspirin (ASPIRIN CHILDRENS) 81 MG chewable tablet Chew 1 tablet (81 mg total) by mouth daily. 03/23/17 03/23/18  Katha Hamming, MD  feeding supplement (BOOST / RESOURCE BREEZE) LIQD Take 1 Container by mouth 3 (three) times daily between meals. 11/02/15   Katharina Caper, MD  furosemide (LASIX) 40 MG tablet Take 0.5 tablets (20 mg total) by mouth daily. 01/04/17   Houston Siren, MD  hydrocortisone (ANUSOL-HC) 2.5 % rectal cream Apply topically 2 (two) times daily. 03/23/17   Katha Hamming, MD  ipratropium-albuterol (DUONEB) 0.5-2.5 (3) MG/3ML SOLN Take 3 mLs by nebulization every 4 (four) hours. 03/23/17   Katha Hamming, MD  magnesium hydroxide (MILK OF MAGNESIA) 400 MG/5ML suspension Take 30 mLs by mouth 2 (two) times daily as needed for mild constipation or moderate constipation. 11/02/15   Katharina Caper, MD  metoprolol tartrate (LOPRESSOR) 25 MG tablet Take 1 tablet (25 mg total) by mouth 2 (two) times daily. Patient not taking: Reported on 03/21/2017 03/05/17   Adrian Saran, MD  Multiple Vitamin (MULTIVITAMIN WITH MINERALS) TABS tablet Take 1 tablet by mouth daily. 03/24/17   Katha Hamming, MD  nitroGLYCERIN  (NITROSTAT) 0.4 MG SL tablet Place 0.4 mg under the tongue every 5 (five) minutes as needed for chest pain. Reported on 10/19/2015    [provider]  oxybutynin (DITROPAN-XL) 5 MG 24 hr tablet Take 1 tablet (5 mg total) by mouth at bedtime. 03/23/17   Katha Hamming, MD  polyethylene glycol (MIRALAX / GLYCOLAX) packet Take 17 g by mouth daily. 03/05/17   Adrian Saran, MD  predniSONE (STERAPRED UNI-PAK 21 TAB) 10 MG (21) TBPK tablet Taper  By 10 mg daily 03/23/17   Katha Hamming, MD  psyllium (HYDROCIL/METAMUCIL) 95 % PACK Take 1 packet by mouth daily. 03/05/17   Adrian Saran, MD  tiotropium (SPIRIVA) 18 MCG inhalation capsule Place 1 capsule (18 mcg total) into inhaler and inhale every evening. 10/09/15 01/14/18  Adrian Saran, MD    Allergies Iodine (kelp) [iodine]  Family History  Problem Relation Age of Onset  . COPD Father   . Bladder Cancer Neg Hx   . Kidney cancer Neg Hx   . Prostate cancer  Neg Hx     Social History Social History   Tobacco Use  . Smoking status: Former Smoker    Packs/day: 3.00    Years: 40.00    Pack years: 120.00    Types: Cigarettes  . Smokeless tobacco: Never Used  . Tobacco comment: quit 62 years  Substance Use Topics  . Alcohol use: Yes    Alcohol/week: 1.8 oz    Types: 3 Glasses of wine per week    Comment: glass of wine at night  . Drug use: No    Review of Systems  Constitutional: No fever/chills Eyes: No visual changes. ENT: No sore throat. Cardiovascular: Denies chest pain. Respiratory:  shortness of breath. Gastrointestinal: No abdominal pain.  No nausea, no vomiting.  No diarrhea.  No constipation. Genitourinary: Negative for dysuria. Musculoskeletal: Negative for back pain. Skin: Negative for rash. Neurological: Negative for headaches, focal weakness   ____________________________________________   PHYSICAL EXAM:  VITAL SIGNS: ED Triage Vitals  Enc Vitals Group     BP 04/02/17 1444 111/71     Pulse Rate  04/02/17 1444 (!) 107     Resp --      Temp 04/02/17 1444 (!) 97.5 F (36.4 C)     Temp Source 04/02/17 1444 Oral     SpO2 04/02/17 1444 94 %     Weight 04/02/17 1445 120 lb (54.4 kg)     Height 04/02/17 1445 5\' 7"  (1.702 m)     Head Circumference --      Peak Flow --      Pain Score 04/02/17 1444 0     Pain Loc --      Pain Edu? --      Excl. in GC? --     Constitutional: Alert and oriented. Well appearing and in no acute distress. Eyes: Conjunctivae are normal. Head: Atraumatic. Nose: No congestion/rhinnorhea. Mouth/Throat: Mucous membranes are moist.  Oropharynx non-erythematous. Neck: No stridor.  Cardiovascular: Normal rate, regular rhythm. Grossly normal heart sounds.  Good peripheral circulation. Respiratory: Normal respiratory effort.  No retractions. Lungs CTAB not the best care movement. Gastrointestinal: Soft and nontender. No distention. No abdominal bruits. No CVA tenderness. Musculoskeletal: No lower extremity tenderness some edema in the ankles.  No joint effusions. Neurologic:  Normal speech and language. No gross focal neurologic deficits are appreciated. No gait instability. Skin:  Skin is warm, dry and intact. No rash noted. Psychiatric: Mood and affect are normal. Speech and behavior are normal.  ____________________________________________   LABS (all labs ordered are listed, but only abnormal results are displayed)  Labs Reviewed  COMPREHENSIVE METABOLIC PANEL - Abnormal; Notable for the following components:      Result Value   Sodium 133 (*)    Chloride 100 (*)    Glucose, Bld 118 (*)    BUN 32 (*)    Calcium 8.7 (*)    Total Protein 5.5 (*)    Albumin 2.6 (*)    GFR calc non Af Amer 46 (*)    GFR calc Af Amer 53 (*)    All other components within normal limits  BRAIN NATRIURETIC PEPTIDE - Abnormal; Notable for the following components:   B Natriuretic Peptide 101.0 (*)    All other components within normal limits  TROPONIN I - Abnormal;  Notable for the following components:   Troponin I 0.50 (*)    All other components within normal limits  CBC WITH DIFFERENTIAL/PLATELET - Abnormal; Notable for the following components:   WBC  12.3 (*)    RBC 3.52 (*)    Hemoglobin 11.3 (*)    HCT 34.3 (*)    RDW 16.1 (*)    Neutro Abs 9.8 (*)    All other components within normal limits  TROPONIN I - Abnormal; Notable for the following components:   Troponin I 0.41 (*)    All other components within normal limits   ____________________________________________  EKG  EKG read and interpreted by me shows sinus tachycardia rate of 104. Irregular baseline he has some PVCs which appear to be multiform right bundle-branch block in the computer is also reading left anterior hemiblock.except for the rate the possibility of multifocal PVCs EKG looks similar to previous ____________________________________________  RADIOLOGY  chest x-ray shows small bilateral pleural effusions shoulder x-ray shows calcific tendinitis ____________________________________________   PROCEDURES  Procedure(s) performed:   Procedures    ____________________________________________   INITIAL IMPRESSION / ASSESSMENT AND PLAN / ED COURSE  patient's troponin has been elevated in the past but not this high. We'll repeat the troponin see what happens.    repeat troponin has dropped. Given the patient's fluid and he is feeling better. Shoulder pain which has been ongoing for weeks as explained by the calcific tendinitis.  however his troponin is still 0.4 and he has pleural effusions which apparently new his [place] count is up to think the safest thing to do would be to watch him overnight and make sure everything is stable before we discharge him.  ____________________________________________   FINAL CLINICAL IMPRESSION(S) / ED DIAGNOSES  Final diagnoses:  COPD exacerbation (HCC)  Dyspnea, unspecified type     ED Discharge Orders    None        Note:  This document was prepared using Dragon voice recognition software and may include unintentional dictation errors.    Arnaldo Natal, MD 04/02/17 [id number]

## 2017-04-03 ENCOUNTER — Other Ambulatory Visit: Payer: Self-pay

## 2017-04-03 LAB — TROPONIN I
Troponin I: 0.68 ng/mL (ref <0.03)
Troponin I: 0.87 ng/mL (ref <0.03)

## 2017-04-03 LAB — PROCALCITONIN: Procalcitonin: <0.1 ng/mL

## 2017-04-03 MED ORDER — GUAIFENESIN ER 600 MG PO TB12
600.0000 mg | ORAL_TABLET | Freq: Two times a day (BID) | ORAL | Status: DC
  Administered 2017-04-03 – 2017-04-05 (×5): 600 mg via ORAL
  Filled 2017-04-03 (×5): qty 1

## 2017-04-03 MED ORDER — BUDESONIDE 0.25 MG/2ML IN SUSP
0.2500 mg | Freq: Two times a day (BID) | RESPIRATORY_TRACT | Status: DC
  Administered 2017-04-03 – 2017-04-05 (×5): 0.25 mg via RESPIRATORY_TRACT
  Filled 2017-04-03 (×4): qty 2

## 2017-04-03 MED ORDER — ALBUTEROL SULFATE (2.5 MG/3ML) 0.083% IN NEBU
INHALATION_SOLUTION | RESPIRATORY_TRACT | Status: AC
  Filled 2017-04-03: qty 3

## 2017-04-03 MED ORDER — TRAZODONE HCL 50 MG PO TABS
50.0000 mg | ORAL_TABLET | Freq: Every evening | ORAL | Status: DC | PRN
  Administered 2017-04-03 – 2017-04-04 (×3): 50 mg via ORAL
  Filled 2017-04-03 (×3): qty 1

## 2017-04-03 MED ORDER — METHYLPREDNISOLONE SODIUM SUCC 125 MG IJ SOLR
60.0000 mg | Freq: Four times a day (QID) | INTRAMUSCULAR | Status: DC
  Administered 2017-04-03 – 2017-04-05 (×8): 60 mg via INTRAVENOUS
  Filled 2017-04-03 (×9): qty 2

## 2017-04-03 MED ORDER — IPRATROPIUM-ALBUTEROL 0.5-2.5 (3) MG/3ML IN SOLN
3.0000 mL | Freq: Four times a day (QID) | RESPIRATORY_TRACT | Status: DC | PRN
  Administered 2017-04-03 (×3): 3 mL via RESPIRATORY_TRACT
  Filled 2017-04-03: qty 3

## 2017-04-03 MED ORDER — IPRATROPIUM-ALBUTEROL 0.5-2.5 (3) MG/3ML IN SOLN
3.0000 mL | Freq: Three times a day (TID) | RESPIRATORY_TRACT | Status: DC
  Administered 2017-04-03 – 2017-04-04 (×3): 3 mL via RESPIRATORY_TRACT
  Filled 2017-04-03 (×3): qty 3

## 2017-04-03 MED ORDER — IPRATROPIUM-ALBUTEROL 0.5-2.5 (3) MG/3ML IN SOLN
RESPIRATORY_TRACT | Status: AC
  Administered 2017-04-03: 3 mL via RESPIRATORY_TRACT
  Filled 2017-04-03: qty 3

## 2017-04-03 MED ORDER — ENSURE ENLIVE PO LIQD
237.0000 mL | Freq: Three times a day (TID) | ORAL | Status: DC
  Administered 2017-04-03 – 2017-04-04 (×5): 237 mL via ORAL

## 2017-04-03 NOTE — Progress Notes (Signed)
Initial Nutrition Assessment  DOCUMENTATION CODES:   Severe malnutrition in context of chronic illness  INTERVENTION:  Will downgrade diet to dysphagia 3 with thin liquids.  Provide Ensure Enlive po TID, each supplement provides 350 kcal and 20 grams of protein.  Continue daily MVI.  NUTRITION DIAGNOSIS:   Severe Malnutrition related to chronic illness(COPD, CHF, advanced age) as evidenced by severe fat depletion, severe muscle depletion.  GOAL:   Patient will meet greater than or equal to 90% of their needs  MONITOR:   PO intake, Supplement acceptance, Labs, Weight trends, Skin, I & O's  REASON FOR ASSESSMENT:   Malnutrition Screening Tool    ASSESSMENT:   83 year old male with PMHx of PTSD, HLD, CAD, COPD, anxiety, CHF, hx DVT, BPH admitted with PNA and acute exacerbation of COPD.   Met with patient at bedside. He is known to this RD from several previous admissions. Patient is short of breath today. He reports he did not eat for 3 days PTA but had his first meal today. He ate about 50% of it. He reports he has not been eating well at home. 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 He does not like [employer].  UBW was 140 lbs. He was 138.7 lbs on 10/19/2015 and was 127.2 lbs. He has had insignificant weight loss over one year.  Medications reviewed and include: Solu-Medrol 60 mg Q6hrs, MVI daily, azithromycin, ceftriaxone.  Labs reviewed: Sodium 133, Chloride 100, BUN 32.  NUTRITION - FOCUSED PHYSICAL EXAM:    Most Recent Value  Orbital Region  Severe depletion  Upper Arm Region  Severe depletion  Thoracic and Lumbar Region  Severe depletion  Buccal Region  Severe depletion  Temple Region  Severe depletion  Clavicle Bone Region  Severe depletion  Clavicle and Acromion Bone Region  Severe depletion  Scapular Bone Region  Severe depletion  Dorsal Hand  Severe depletion  Patellar Region   Severe depletion  Anterior Thigh Region  Severe depletion  Posterior Calf Region  Severe depletion  Edema (RD Assessment)  Mild  Hair  Reviewed  Eyes  Reviewed  Mouth  Reviewed  Skin  Reviewed  Nails  Reviewed       Diet Order:  Diet Heart Room service appropriate? Yes; Fluid consistency: Thin  EDUCATION NEEDS:   No education needs have been identified at this time  Skin:  Skin Assessment: Skin Integrity Issues: Skin Integrity Issues:: Other (Comment) Other: MSAD to perineum  Last BM:  Unknown  Height:   Ht Readings from Last 1 Encounters:  04/02/17 _0  (1.702 m)    Weight:   Wt Readings from Last 1 Encounters:  04/02/17 120 lb (54.4 kg)    Ideal Body Weight:  67.3 kg  BMI:  Body mass index is 18.79 kg/m.  Estimated Nutritional Needs:   Kcal:  1630-1900 (30-35 kcal/kg)  Protein:  82-92 grams (1.5-1.7 grams/kg)  Fluid:  1.6-1.9 L/day (1 mL/kcal)  Willey Blade, MS, RD, LDN Office: [phone] Pager: [phone] After Hours/Weekend Pager: [phone]

## 2017-04-03 NOTE — Care Management (Signed)
[facility].  Agency was not aware that patient had been admitted to the hospital. It is not known if  patient contacted the agency prior to proceeding to the ED.

## 2017-04-03 NOTE — Care Management Important Message (Signed)
Important Message  Patient Details  Name: Daniel Dickson MRN: [id number] Date of Birth: 07/26/1912   Medicare Important Message Given:  Yes Signed IM notice given    Eber HongGreene, Oliver Neuwirth R, RN 04/03/2017, 5:17 PM

## 2017-04-03 NOTE — Care Management Important Message (Signed)
Important Message  Patient Details  Name: Daniel Dickson MRN: [id number] Date of Birth: 11/14/1912   Medicare Important Message Given:     Signed IM notice given   Eber HongGreene, Tyreck Bell R, RN 04/03/2017, 4:49 PM

## 2017-04-03 NOTE — Progress Notes (Signed)
[employer] - [place] [facility]                                                                                                                                                                                  Patient Demographics   Daniel Dickson, is a [age] y.o. male, DOB - 08/05/1912, NWG:[id number]  Admit date - 04/02/2017   Admitting Physician Bertrum SolMontell D Salary, MD  Outpatient Primary MD for the patient is Dortha KernBliss, Laura K, MD   LOS - 1  Subjective: Pt is very short of breath Denies any chest pain Positive cough and congestion    Review of Systems:   CONSTITUTIONAL: No documented fever. No fatigue, weakness. No weight gain, no weight loss.  EYES: No blurry or double vision.  ENT: No tinnitus. No postnasal drip. No redness of the oropharynx.  RESPIRATORY: Positive cough, no wheeze, no hemoptysis.  Positive dyspnea.  CARDIOVASCULAR: No chest pain. No orthopnea. No palpitations. No syncope.  GASTROINTESTINAL: No nausea, no vomiting or diarrhea. No abdominal pain. No melena or hematochezia.  GENITOURINARY: No dysuria or hematuria.  ENDOCRINE: No polyuria or nocturia. No heat or cold intolerance.  HEMATOLOGY: No anemia. No bruising. No bleeding.  INTEGUMENTARY: No rashes. No lesions.  MUSCULOSKELETAL: No arthritis. No swelling. No gout.  NEUROLOGIC: No numbness, tingling, or ataxia. No seizure-type activity.  PSYCHIATRIC: No anxiety. No insomnia. No ADD.    Vitals:   Vitals:   04/02/17 2053 04/03/17 0313 04/03/17 0741 04/03/17 0756  BP: (!) 162/82 (!) 141/79 111/73   Pulse: (!) 103 (!) 102 91   Resp:  19 18   Temp:  98.3 F (36.8 C)    TempSrc:  Oral    SpO2:  98% 99% 97%  Weight:      Height:        Wt Readings from Last 3 Encounters:  04/02/17 120 lb (54.4 kg)  03/21/17 120 lb 4.8 oz (54.6 kg)  03/04/17 124 lb (56.2 kg)     Intake/Output Summary (Last 24 hours) at 04/03/2017 1437 Last data filed at 04/03/2017 1349 Gross per 24 hour  Intake 610  ml  Output -  Net 610 ml    Physical Exam:   GENERAL: Pleasant-appearing in no apparent distress.  HEAD, EYES, EARS, NOSE AND THROAT: Atraumatic, normocephalic. Extraocular muscles are intact. Pupils equal and reactive to light. Sclerae anicteric. No conjunctival injection. No oro-pharyngeal erythema.  NECK: Supple. There is no jugular venous distention. No bruits, no lymphadenopathy, no thyromegaly.  HEART: Regular rate and rhythm,. No murmurs, no rubs, no clicks.  LUNGS: Clear to auscultation bilaterally. No rales or rhonchi. No wheezes.  ABDOMEN: Soft, flat, nontender, nondistended.  Has good bowel sounds. No hepatosplenomegaly appreciated.  EXTREMITIES: No evidence of any cyanosis, clubbing, or peripheral edema.  +2 pedal and radial pulses bilaterally.  NEUROLOGIC: The patient is alert, awake, and oriented x3 with no focal motor or sensory deficits appreciated bilaterally.  SKIN: Moist and warm with no rashes appreciated.  Psych: Not anxious, depressed LN: No inguinal LN enlargement    Antibiotics   Anti-infectives (From admission, onward)   Start     Dose/Rate Route Frequency Ordered Stop   04/02/17 2100  cefTRIAXone (ROCEPHIN) 1 g in dextrose 5 % 50 mL injection     1 g Intravenous Every 24 hours 04/02/17 2017 04/09/17 2059   04/02/17 2015  cefTRIAXone (ROCEPHIN) 1 g in dextrose 5 % 50 mL IVPB - Premix  Status:  Discontinued     1 g 100 mL/hr over 30 Minutes Intravenous Every 24 hours 04/02/17 2005 04/02/17 2015   04/02/17 2015  azithromycin (ZITHROMAX) 500 mg in dextrose 5 % 250 mL IVPB     500 mg 250 mL/hr over 60 Minutes Intravenous Every 24 hours 04/02/17 2005 04/09/17 2014      Medications   Scheduled Meds: . aspirin  81 mg Oral Daily  . feeding supplement  1 Container Oral TID BM  . furosemide  20 mg Oral Daily  . guaiFENesin  600 mg Oral BID  . heparin  5,000 Units Subcutaneous Q8H  . hydrocortisone   Topical BID  . ipratropium-albuterol  3 mL Nebulization TID   . methylPREDNISolone (SOLU-MEDROL) injection  60 mg Intravenous Q6H  . multivitamin with minerals  1 tablet Oral Daily  . oxybutynin  5 mg Oral QHS  . sodium chloride flush  3 mL Intravenous Q12H   Continuous Infusions: . sodium chloride    . azithromycin Stopped (04/02/17 2313)  . small volume/piggyback builder     PRN Meds:.sodium chloride, acetaminophen, hydrALAZINE, ipratropium-albuterol, magnesium hydroxide, nitroGLYCERIN, polyethylene glycol, psyllium, sodium chloride flush, traZODone   Data Review:   Micro Results Recent Results (from the past 240 hour(s))  Culture, blood (routine x 2) Call MD if unable to obtain prior to antibiotics being given     Status: None (Preliminary result)   Collection Time: 04/02/17  8:33 PM  Result Value Ref Range Status   Specimen Description BLOOD RIGHT ANTECUBITAL  Final   Special Requests   Final    BOTTLES DRAWN AEROBIC AND ANAEROBIC Blood Culture adequate volume   Culture   Final    NO GROWTH < 12 HOURS Performed at [facility], [address], [place], [place] [address]    Report Status PENDING  Incomplete  Culture, blood (routine x 2) Call MD if unable to obtain prior to antibiotics being given     Status: None (Preliminary result)   Collection Time: 04/02/17  8:33 PM  Result Value Ref Range Status   Specimen Description BLOOD LEFT ANTECUBITAL  Final   Special Requests   Final    BOTTLES DRAWN AEROBIC AND ANAEROBIC Blood Culture adequate volume   Culture   Final    NO GROWTH < 12 HOURS Performed at [facility], [address], [place], [place] [address]    Report Status PENDING  Incomplete    Radiology Reports Dg Chest Portable 1 View  Result Date: 04/02/2017 CLINICAL DATA:  Initial evaluation for acute shortness of breath. EXAM: PORTABLE CHEST 1 VIEW COMPARISON:  None. FINDINGS: Cardiac and mediastinal silhouettes are stable in size and contour, and remain within normal  limits. Aortic atherosclerosis.  Lungs normally inflated. Underlying COPD. Small layering bilateral pleural effusions. Associated bibasilar opacities likely atelectasis, although infiltrates could be considered as well. No pulmonary edema. No pneumothorax. No acute osseus abnormality. IMPRESSION: 1. Small layering bilateral pleural effusions with associated bibasilar opacities, likely atelectasis. Infiltrates could be considered in the correct clinical setting. 2. Underlying COPD. 3. Aortic atherosclerosis. Electronically Signed   By: Rise Mu M.D.   On: 04/02/2017 15:52   Dg Shoulder Left  Result Date: 04/02/2017 CLINICAL DATA:  Initial evaluation for acute left shoulder pain. No injury. EXAM: LEFT SHOULDER - 2+ VIEW COMPARISON:  None. FINDINGS: No acute fracture or dislocation. Humeral head in normal line with the glenoid. AC joint approximated. Heterotopic calcification at the superolateral aspect of the humeral head consistent with calcific tendinopathy. Osteoarthritic changes about the [facility] joint as well. Bones are mildly osteopenic. No soft tissue abnormality. IMPRESSION: 1. No acute osseous abnormality about the left shoulder. 2. Heterotopic calcification at the superolateral aspect of the left femoral head, consistent with underlying calcific tendinopathy. 3. Mild right acromioclavicular osteoarthrosis. Electronically Signed   By: Rise Mu M.D.   On: 04/02/2017 15:50     CBC Recent Labs  Lab 04/02/17 1505  WBC 12.3*  HGB 11.3*  HCT 34.3*  PLT 167  MCV 97.5  MCH 32.2  MCHC 33.0  RDW 16.1*  LYMPHSABS 1.3  MONOABS 0.9  EOSABS 0.2  BASOSABS 0.1    Chemistries  Recent Labs  Lab 04/02/17 1505  NA 133*  K 3.9  CL 100*  CO2 26  GLUCOSE 118*  BUN 32*  CREATININE 1.22  CALCIUM 8.7*  AST 32  ALT 21  ALKPHOS 45  BILITOT 0.8   ------------------------------------------------------------------------------------------------------------------ estimated creatinine clearance is 23.5 mL/min (by  C-G formula based on SCr of 1.22 mg/dL). ------------------------------------------------------------------------------------------------------------------ No results for input(s): HGBA1C in the last 72 hours. ------------------------------------------------------------------------------------------------------------------ No results for input(s): CHOL, HDL, LDLCALC, TRIG, CHOLHDL, LDLDIRECT in the last 72 hours. ------------------------------------------------------------------------------------------------------------------ No results for input(s): TSH, T4TOTAL, T3FREE, THYROIDAB in the last 72 hours.  Invalid input(s): FREET3 ------------------------------------------------------------------------------------------------------------------ No results for input(s): VITAMINB12, FOLATE, FERRITIN, TIBC, IRON, RETICCTPCT in the last 72 hours.  Coagulation profile No results for input(s): INR, PROTIME in the last 168 hours.  No results for input(s): DDIMER in the last 72 hours.  Cardiac Enzymes Recent Labs  Lab 04/02/17 2033 04/03/17 0251 04/03/17 0834  TROPONINI 0.50* 0.68* 0.87*   ------------------------------------------------------------------------------------------------------------------ Invalid input(s): POCBNP    Assessment & Plan   AIMPRESSION AND PLAN: 1 acute probable community-acquired pneumonia Continue Rocephin/azithromycin, supplemental oxygen as needed, follow-up on cultures  2 acute on chronic COPD exasperation Add Solu-Medrol to current regimen continue nebulizer therapy  3.  Elevated troponin suspect due to demand ischemia due to his advanced age she is not a candidate for any intervention Continue aspirin and monitor  4 acute on chronic hypoxic respiratory failure Stable Continue home regiment-on 2 L via nasal cannula at home continuous  5 chronic protein energy/calorie malnutrition Dietary/nutritionist consulted for expert opinion    6.  CODE  STATUS confirmed with the patient and family he is a DNR     Code Status Orders  (From admission, onward)        Start     Ordered   04/03/17 1120  Do not attempt resuscitation (DNR)  Continuous    Question Answer Comment  In the event of cardiac or respiratory ARREST Do not call a "code blue"   In the  event of cardiac or respiratory ARREST Do not perform Intubation, CPR, defibrillation or ACLS   In the event of cardiac or respiratory ARREST Use medication by any route, position, wound care, and other measures to relive pain and suffering. May use oxygen, suction and manual treatment of airway obstruction as needed for comfort.      04/03/17 1120    Code Status History    Date Active Date Inactive Code Status Order ID Comments User Context   04/02/2017 20:05 04/03/2017 11:20 Full Code [id number]  Bertrum SolSalary, Montell D, MD ED   03/21/2017 03:02 03/23/2017 17:35 Full Code [id number]  Bertrum SolSalary, Montell D, MD Inpatient   03/04/2017 10:38 03/05/2017 17:53 Full Code [id number]  Adrian SaranMody, Sital, MD Inpatient   03/04/2017 09:59 03/04/2017 10:38 DNR [id number]  Adrian SaranMody, Sital, MD Inpatient   01/20/2017 04:46 01/21/2017 16:22 Full Code [id number]  Tonye RoyaltyHugelmeyer, Alexis, DO Inpatient   01/02/2017 19:37 01/03/2017 17:59 DNR [id number]  Milagros LollSudini, Srikar, MD ED   11/12/2016 09:32 11/13/2016 15:37 Full Code [id number]  Gasper LloydJacobs, Monique D, RN Inpatient   11/11/2016 11:23 11/12/2016 09:32 DNR [id number]  Enedina FinnerPatel, Sona, MD Inpatient   10/18/2016 20:36 10/20/2016 19:52 DNR [id number]  Milagros LollSudini, Srikar, MD ED   09/27/2016 15:00 09/28/2016 17:19 DNR [id number]  Altamese DillingVachhani, Vaibhavkumar, MD Inpatient   09/24/2016 08:52 09/27/2016 15:00 Full Code [id number]  Enid BaasKalisetti, Radhika, MD Inpatient   08/30/2016 05:16 09/02/2016 16:04 Full Code [id number]  Arnaldo Nataliamond, Michael S, MD Inpatient   02/18/2016 08:27 02/22/2016 18:50 Full Code [id number]  Enedina FinnerPatel, Sona, MD Inpatient   10/29/2015 21:19 11/02/2015 14:22 Full Code [id number]  Enid BaasKalisetti, Radhika, MD Inpatient   10/07/2015  07:32 10/09/2015 16:29 Full Code [id number]  Ihor AustinPyreddy, Pavan, MD Inpatient   06/07/2015 00:04 06/10/2015 16:31 Full Code [id number]  Oralia ManisWillis, David, MD Inpatient   01/11/2015 13:54 01/12/2015 13:58 Full Code [id number]  Alford HighlandWieting, Richard, MD ED    Advance Directive Documentation     Most Recent Value  Type of Advance Directive  Healthcare Power of Attorney, Living will  Pre-existing out of facility DNR order (yellow form or pink MOST form)  No data  "MOST" Form in Place?  No data           Consults none  DVT Prophylaxis  Lovenox  Lab Results  Component Value Date   PLT 167 04/02/2017     Time Spent in minutes   35min  Greater than 50% of time spent in care coordination and counseling patient regarding the condition and plan of care.   Auburn BilberryPATEL, Wellington Winegarden M.D on 04/03/2017 at 2:37 PM  Between 7am to 6pm - Pager - [phone]  After 6pm go to [email or web address] - password [email or web address] [facility]  [facility]   Office  [phone]

## 2017-04-04 LAB — BASIC METABOLIC PANEL
Anion gap: 8 (ref 5–15)
BUN: 33 mg/dL — AB (ref 6–20)
CHLORIDE: 98 mmol/L — AB (ref 101–111)
CO2: 25 mmol/L (ref 22–32)
Calcium: 8.6 mg/dL — ABNORMAL LOW (ref 8.9–10.3)
Creatinine, Ser: 1.16 mg/dL (ref 0.61–1.24)
GFR calc Af Amer: 57 mL/min — ABNORMAL LOW (ref >60)
GFR calc non Af Amer: 49 mL/min — ABNORMAL LOW (ref >60)
GLUCOSE: 151 mg/dL — AB (ref 65–99)
POTASSIUM: 3.9 mmol/L (ref 3.5–5.1)
Sodium: 131 mmol/L — ABNORMAL LOW (ref 135–145)

## 2017-04-04 LAB — CBC
HEMATOCRIT: 33.8 % — AB (ref 40.0–52.0)
Hemoglobin: 11.6 g/dL — ABNORMAL LOW (ref 13.0–18.0)
MCH: 33 pg (ref 26.0–34.0)
MCHC: 34.4 g/dL (ref 32.0–36.0)
MCV: 96 fL (ref 80.0–100.0)
Platelets: 167 10*3/uL (ref 150–440)
RBC: 3.52 MIL/uL — ABNORMAL LOW (ref 4.40–5.90)
RDW: 15.8 % — AB (ref 11.5–14.5)
WBC: 6.6 10*3/uL (ref 3.8–10.6)

## 2017-04-04 LAB — HIV ANTIBODY (ROUTINE TESTING W REFLEX): HIV SCREEN 4TH GENERATION: NONREACTIVE

## 2017-04-04 LAB — LEGIONELLA PNEUMOPHILA SEROGP 1 UR AG: L. PNEUMOPHILA SEROGP 1 UR AG: NEGATIVE

## 2017-04-04 MED ORDER — IPRATROPIUM-ALBUTEROL 0.5-2.5 (3) MG/3ML IN SOLN
3.0000 mL | Freq: Three times a day (TID) | RESPIRATORY_TRACT | Status: DC
  Administered 2017-04-04 – 2017-04-05 (×2): 3 mL via RESPIRATORY_TRACT
  Filled 2017-04-04 (×2): qty 3

## 2017-04-04 NOTE — Progress Notes (Signed)
Pt has been confused and agitated today. There was concern over missing hearing aids today. Spoke with pt's daughter who confirmed that the pt did not bring the hearing aids here this admission, they are at his home. She stated that he often gets confused and agitated when he is in the hospital. Informed the pt that his hearing aids were at home.

## 2017-04-04 NOTE — Progress Notes (Signed)
[employer] - [place] at [facility]                                                                                                                                                                                  Patient Demographics   Daniel Dickson, is a 83 y.o. male, DOB - 01/11/1913, ZOX:[id number]  Admit date - 04/02/2017   Admitting Physician Bertrum Sol, MD  Outpatient Primary MD for the patient is [name], MD   LOS - 2  Subjective: Patient's breathing is improved but still short of breath Denies any chest pain    Review of Systems:   CONSTITUTIONAL: No documented fever. No fatigue, weakness. No weight gain, no weight loss.  EYES: No blurry or double vision.  ENT: No tinnitus. No postnasal drip. No redness of the oropharynx.  RESPIRATORY: Positive cough, no wheeze, no hemoptysis.  Positive dyspnea.  CARDIOVASCULAR: No chest pain. No orthopnea. No palpitations. No syncope.  GASTROINTESTINAL: No nausea, no vomiting or diarrhea. No abdominal pain. No melena or hematochezia.  GENITOURINARY: No dysuria or hematuria.  ENDOCRINE: No polyuria or nocturia. No heat or cold intolerance.  HEMATOLOGY: No anemia. No bruising. No bleeding.  INTEGUMENTARY: No rashes. No lesions.  MUSCULOSKELETAL: No arthritis. No swelling. No gout.  NEUROLOGIC: No numbness, tingling, or ataxia. No seizure-type activity.  PSYCHIATRIC: No anxiety. No insomnia. No ADD.    Vitals:   Vitals:   04/04/17 0359 04/04/17 0553 04/04/17 0808 04/04/17 1334  BP: 138/85  117/63   Pulse: 91  94   Resp: 17  18   Temp: (!) 97.4 F (36.3 C)  98.4 F (36.9 C)   TempSrc: Oral     SpO2: 100% 97% 100% 97%  Weight: 119 lb 8 oz (54.2 kg)     Height:        Wt Readings from Last 3 Encounters:  04/04/17 119 lb 8 oz (54.2 kg)  03/21/17 120 lb 4.8 oz (54.6 kg)  03/04/17 124 lb (56.2 kg)     Intake/Output Summary (Last 24 hours) at 04/04/2017 1405 Last data filed at 04/04/2017 8119 Gross  per 24 hour  Intake 310 ml  Output 1350 ml  Net -1040 ml    Physical Exam:   GENERAL: Pleasant-appearing in no apparent distress.  HEAD, EYES, EARS, NOSE AND THROAT: Atraumatic, normocephalic. Extraocular muscles are intact. Pupils equal and reactive to light. Sclerae anicteric. No conjunctival injection. No oro-pharyngeal erythema.  NECK: Supple. There is no jugular venous distention. No bruits, no lymphadenopathy, no thyromegaly.  HEART: Regular rate and rhythm,. No murmurs, no rubs, no clicks.  LUNGS: Rhonchus breath sounds without accessory muscle usage ABDOMEN: Soft,  flat, nontender, nondistended. Has good bowel sounds. No hepatosplenomegaly appreciated.  EXTREMITIES: No evidence of any cyanosis, clubbing, or peripheral edema.  +2 pedal and radial pulses bilaterally.  NEUROLOGIC: The patient is alert, awake, and oriented x3 with no focal motor or sensory deficits appreciated bilaterally.  SKIN: Moist and warm with no rashes appreciated.  Psych: Not anxious, depressed LN: No inguinal LN enlargement    Antibiotics   Anti-infectives (From admission, onward)   Start     Dose/Rate Route Frequency Ordered Stop   04/02/17 2100  cefTRIAXone (ROCEPHIN) 1 g in dextrose 5 % 50 mL injection     1 g Intravenous Every 24 hours 04/02/17 2017 04/09/17 2059   04/02/17 2015  cefTRIAXone (ROCEPHIN) 1 g in dextrose 5 % 50 mL IVPB - Premix  Status:  Discontinued     1 g 100 mL/hr over 30 Minutes Intravenous Every 24 hours 04/02/17 2005 04/02/17 2015   04/02/17 2015  azithromycin (ZITHROMAX) 500 mg in dextrose 5 % 250 mL IVPB     500 mg 250 mL/hr over 60 Minutes Intravenous Every 24 hours 04/02/17 2005 04/09/17 2014      Medications   Scheduled Meds: . aspirin  81 mg Oral Daily  . budesonide (PULMICORT) nebulizer solution  0.25 mg Nebulization BID  . feeding supplement (ENSURE ENLIVE)  237 mL Oral TID BM  . furosemide  20 mg Oral Daily  . guaiFENesin  600 mg Oral BID  . heparin  5,000  Units Subcutaneous Q8H  . hydrocortisone   Topical BID  . ipratropium-albuterol  3 mL Nebulization TID  . methylPREDNISolone (SOLU-MEDROL) injection  60 mg Intravenous Q6H  . multivitamin with minerals  1 tablet Oral Daily  . oxybutynin  5 mg Oral QHS  . sodium chloride flush  3 mL Intravenous Q12H   Continuous Infusions: . sodium chloride    . azithromycin Stopped (04/03/17 2111)  . small volume/piggyback builder     PRN Meds:.sodium chloride, acetaminophen, hydrALAZINE, ipratropium-albuterol, magnesium hydroxide, nitroGLYCERIN, polyethylene glycol, psyllium, sodium chloride flush, traZODone   Data Review:   Micro Results Recent Results (from the past 240 hour(s))  Culture, blood (routine x 2) Call MD if unable to obtain prior to antibiotics being given     Status: None (Preliminary result)   Collection Time: 04/02/17  8:33 PM  Result Value Ref Range Status   Specimen Description BLOOD RIGHT ANTECUBITAL  Final   Special Requests   Final    BOTTLES DRAWN AEROBIC AND ANAEROBIC Blood Culture adequate volume   Culture   Final    NO GROWTH 2 DAYS Performed at [facility], [address] [place], [place] [address]    Report Status PENDING  Incomplete  Culture, blood (routine x 2) Call MD if unable to obtain prior to antibiotics being given     Status: None (Preliminary result)   Collection Time: 04/02/17  8:33 PM  Result Value Ref Range Status   Specimen Description BLOOD LEFT ANTECUBITAL  Final   Special Requests   Final    BOTTLES DRAWN AEROBIC AND ANAEROBIC Blood Culture adequate volume   Culture   Final    NO GROWTH 2 DAYS Performed at [facility], [address] [place], [place] [address]    Report Status PENDING  Incomplete    Radiology Reports Dg Chest Portable 1 View  Result Date: 04/02/2017 CLINICAL DATA:  Initial evaluation for acute shortness of breath. EXAM: PORTABLE CHEST 1 VIEW COMPARISON:  None. FINDINGS:  Cardiac and mediastinal  silhouettes are stable in size and contour, and remain within normal limits. Aortic atherosclerosis. Lungs normally inflated. Underlying COPD. Small layering bilateral pleural effusions. Associated bibasilar opacities likely atelectasis, although infiltrates could be considered as well. No pulmonary edema. No pneumothorax. No acute osseus abnormality. IMPRESSION: 1. Small layering bilateral pleural effusions with associated bibasilar opacities, likely atelectasis. Infiltrates could be considered in the correct clinical setting. 2. Underlying COPD. 3. Aortic atherosclerosis. Electronically Signed   By: Rise MuBenjamin  McClintock M.D.   On: 04/02/2017 15:52   Dg Shoulder Left  Result Date: 04/02/2017 CLINICAL DATA:  Initial evaluation for acute left shoulder pain. No injury. EXAM: LEFT SHOULDER - 2+ VIEW COMPARISON:  None. FINDINGS: No acute fracture or dislocation. Humeral head in normal line with the glenoid. AC joint approximated. Heterotopic calcification at the superolateral aspect of the humeral head consistent with calcific tendinopathy. Osteoarthritic changes about the [facility] as well. Bones are mildly osteopenic. No soft tissue abnormality. IMPRESSION: 1. No acute osseous abnormality about the left shoulder. 2. Heterotopic calcification at the superolateral aspect of the left femoral head, consistent with underlying calcific tendinopathy. 3. Mild right acromioclavicular osteoarthrosis. Electronically Signed   By: Rise MuBenjamin  McClintock M.D.   On: 04/02/2017 15:50     CBC Recent Labs  Lab 04/02/17 1505 04/04/17 0530  WBC 12.3* 6.6  HGB 11.3* 11.6*  HCT 34.3* 33.8*  PLT 167 167  MCV 97.5 96.0  MCH 32.2 33.0  MCHC 33.0 34.4  RDW 16.1* 15.8*  LYMPHSABS 1.3  --   MONOABS 0.9  --   EOSABS 0.2  --   BASOSABS 0.1  --     Chemistries  Recent Labs  Lab 04/02/17 1505 04/04/17 0530  NA 133* 131*  K 3.9 3.9  CL 100* 98*  CO2 26 25  GLUCOSE 118* 151*  BUN 32* 33*  CREATININE 1.22 1.16   CALCIUM 8.7* 8.6*  AST 32  --   ALT 21  --   ALKPHOS 45  --   BILITOT 0.8  --    ------------------------------------------------------------------------------------------------------------------ estimated creatinine clearance is 24.7 mL/min (by C-G formula based on SCr of 1.16 mg/dL). ------------------------------------------------------------------------------------------------------------------ No results for input(s): HGBA1C in the last 72 hours. ------------------------------------------------------------------------------------------------------------------ No results for input(s): CHOL, HDL, LDLCALC, TRIG, CHOLHDL, LDLDIRECT in the last 72 hours. ------------------------------------------------------------------------------------------------------------------ No results for input(s): TSH, T4TOTAL, T3FREE, THYROIDAB in the last 72 hours.  Invalid input(s): FREET3 ------------------------------------------------------------------------------------------------------------------ No results for input(s): VITAMINB12, FOLATE, FERRITIN, TIBC, IRON, RETICCTPCT in the last 72 hours.  Coagulation profile No results for input(s): INR, PROTIME in the last 168 hours.  No results for input(s): DDIMER in the last 72 hours.  Cardiac Enzymes Recent Labs  Lab 04/02/17 2033 04/03/17 0251 04/03/17 0834  TROPONINI 0.50* 0.68* 0.87*   ------------------------------------------------------------------------------------------------------------------ Invalid input(s): POCBNP    Assessment & Plan   AIMPRESSION AND PLAN: 1 acute probable community-acquired pneumonia Continue Rocephin/azithromycin, supplemental oxygen   2 acute on chronic COPD exasperation Continue Solu-Medrol to current regimen continue nebulizer therapy  3.  Elevated troponin suspect due to demand ischemia due to his advanced age he is not a candidate for any intervention Continue aspirin and monitor  4 acute on  chronic hypoxic respiratory failure Stable Continue home regiment-on 2 L via nasal cannula at home continuous  5 chronic protein energy/calorie malnutrition Dietary/nutritionist consulted for expert opinion    6.  CODE STATUS confirmed with the patient and family he is a DNR     Code Status Orders  (  From admission, onward)        Start     Ordered   04/03/17 1120  Do not attempt resuscitation (DNR)  Continuous    Question Answer Comment  In the event of cardiac or respiratory ARREST Do not call a "code blue"   In the event of cardiac or respiratory ARREST Do not perform Intubation, CPR, defibrillation or ACLS   In the event of cardiac or respiratory ARREST Use medication by any route, position, wound care, and other measures to relive pain and suffering. May use oxygen, suction and manual treatment of airway obstruction as needed for comfort.      04/03/17 1120    Code Status History    Date Active Date Inactive Code Status Order ID Comments User Context   04/02/2017 20:05 04/03/2017 11:20 Full Code [id number]  Bertrum Sol, MD ED   03/21/2017 03:02 03/23/2017 17:35 Full Code [id number]  Bertrum Sol, MD Inpatient   03/04/2017 10:38 03/05/2017 17:53 Full Code [id number]  Adrian Saran, MD Inpatient   03/04/2017 09:59 03/04/2017 10:38 DNR [id number]  Adrian Saran, MD Inpatient   01/20/2017 04:46 01/21/2017 16:22 Full Code [id number]  Tonye Royalty, DO Inpatient   01/02/2017 19:37 01/03/2017 17:59 DNR [id number]  Milagros Loll, MD ED   11/12/2016 09:32 11/13/2016 15:37 Full Code [id number]  Gasper Lloyd, RN Inpatient   11/11/2016 11:23 11/12/2016 09:32 DNR [id number]  Enedina Finner, MD Inpatient   10/18/2016 20:36 10/20/2016 19:52 DNR [id number]  Milagros Loll, MD ED   09/27/2016 15:00 09/28/2016 17:19 DNR [id number]  Altamese Dilling, MD Inpatient   09/24/2016 08:52 09/27/2016 15:00 Full Code [id number]  Enid Baas, MD Inpatient   08/30/2016 05:16 09/02/2016 16:04 Full Code  [id number]  Arnaldo Natal, MD Inpatient   02/18/2016 08:27 02/22/2016 18:50 Full Code [id number]  Enedina Finner, MD Inpatient   10/29/2015 21:19 11/02/2015 14:22 Full Code [id number]  Enid Baas, MD Inpatient   10/07/2015 07:32 10/09/2015 16:29 Full Code [id number]  Ihor Austin, MD Inpatient   06/07/2015 00:04 06/10/2015 16:31 Full Code [id number]  Oralia Manis, MD Inpatient   01/11/2015 13:54 01/12/2015 13:58 Full Code [id number]  Alford Highland, MD ED    Advance Directive Documentation     Most Recent Value  Type of Advance Directive  Healthcare Power of Attorney, Living will  Pre-existing out of facility DNR order (yellow form or pink MOST form)  No data  "MOST" Form in Place?  No data           Consults none  DVT Prophylaxis  Lovenox  Lab Results  Component Value Date   PLT 167 04/04/2017     Time Spent in minutes    Greater than 50% of time spent in care coordination and counseling patient regarding the condition and plan of care.   Auburn Bilberry M.D on 04/04/2017 at 2:05 PM  Between 7am to 6pm - Pager - [phone]  After 6pm go to [email or web address] - password [email or web address] [facility]  [facility] [place] [facility]   Office  [phone]

## 2017-04-05 ENCOUNTER — Telehealth: Payer: Self-pay | Admitting: Cardiovascular Disease

## 2017-04-05 ENCOUNTER — Telehealth: Payer: Self-pay | Admitting: Gastroenterology

## 2017-04-05 MED ORDER — AZITHROMYCIN 250 MG PO TABS: 500.0000 mg | ORAL_TABLET | Freq: Every day | ORAL | Status: DC

## 2017-04-05 MED ORDER — PREDNISONE 10 MG (21) PO TBPK: ORAL_TABLET | ORAL | 0 refills | Status: DC

## 2017-04-05 MED ORDER — AMOXICILLIN-POT CLAVULANATE 875-125 MG PO TABS: 1.0000 | ORAL_TABLET | Freq: Two times a day (BID) | ORAL | 0 refills | Status: DC

## 2017-04-05 MED ORDER — BUDESONIDE 0.25 MG/2ML IN SUSP: 0.2500 mg | Freq: Two times a day (BID) | RESPIRATORY_TRACT | 12 refills | Status: AC

## 2017-04-05 MED ORDER — AZITHROMYCIN 250 MG PO TABS: ORAL_TABLET | ORAL | 0 refills | Status: DC

## 2017-04-05 NOTE — Progress Notes (Signed)
Pt discharged to home. Heart monitor and IV removed. Reviewed discharge paperwork and instructions with the patient's daughter.

## 2017-04-05 NOTE — Progress Notes (Signed)
[employer] - [place] at [facility]                                                                                                                                                                                  Patient Demographics   Daniel Dickson, is a [age] y.o. male, DOB - 02/21/1913, ZOX:[id number]  Admit date - 04/02/2017   Admitting Physician Bertrum SolMontell D Salary, MD  Outpatient Primary MD for the patient is Quillian QuinceBliss, Doreene NestLaura K, MD   LOS - 3  Subjective:  discharge home today   Review of Systems:   CONSTITUTIONAL: No documented fever. No fatigue, weakness. No weight gain, no weight loss.  EYES: No blurry or double vision.  ENT: No tinnitus. No postnasal drip. No redness of the oropharynx.  RESPIRATORY: Positive cough, no wheeze, no hemoptysis.  Positive dyspnea.  CARDIOVASCULAR: No chest pain. No orthopnea. No palpitations. No syncope.  GASTROINTESTINAL: No nausea, no vomiting or diarrhea. No abdominal pain. No melena or hematochezia.  GENITOURINARY: No dysuria or hematuria.  ENDOCRINE: No polyuria or nocturia. No heat or cold intolerance.  HEMATOLOGY: No anemia. No bruising. No bleeding.  INTEGUMENTARY: No rashes. No lesions.  MUSCULOSKELETAL: No arthritis. No swelling. No gout.  NEUROLOGIC: No numbness, tingling, or ataxia. No seizure-type activity.  PSYCHIATRIC: No anxiety. No insomnia. No ADD.    Vitals:   Vitals:   04/04/17 2354 04/05/17 0309 04/05/17 0732 04/05/17 0900  BP:  (!) 114/56 (!) 154/76   Pulse:  93 96   Resp:  17 18   Temp:  97.9 F (36.6 C) (!) 97.4 F (36.3 C)   TempSrc:   Oral   SpO2: 98% 99% 98% 99%  Weight:  55.2 kg (121 lb 9.6 oz)    Height:        Wt Readings from Last 3 Encounters:  04/05/17 55.2 kg (121 lb 9.6 oz)  03/21/17 54.6 kg (120 lb 4.8 oz)  03/04/17 56.2 kg (124 lb)     Intake/Output Summary (Last 24 hours) at 04/05/2017 1512 Last data filed at 04/05/2017 1048 Gross per 24 hour  Intake 610 ml  Output 850 ml  Net  -240 ml    Physical Exam:   GENERAL: Pleasant-appearing in no apparent distress.  HEAD, EYES, EARS, NOSE AND THROAT: Atraumatic, normocephalic. Extraocular muscles are intact. Pupils equal and reactive to light. Sclerae anicteric. No conjunctival injection. No oro-pharyngeal erythema.  NECK: Supple. There is no jugular venous distention. No bruits, no lymphadenopathy, no thyromegaly.  HEART: Regular rate and rhythm,. No murmurs, no rubs, no clicks.  LUNGS: Rhonchus breath sounds without accessory muscle usage ABDOMEN: Soft, flat, nontender, nondistended. Has good bowel sounds. No  hepatosplenomegaly appreciated.  EXTREMITIES: No evidence of any cyanosis, clubbing, or peripheral edema.  +2 pedal and radial pulses bilaterally.  NEUROLOGIC: The patient is alert, awake, and oriented x3 with no focal motor or sensory deficits appreciated bilaterally.  SKIN: Moist and warm with no rashes appreciated.  Psych: Not anxious, depressed LN: No inguinal LN enlargement    Antibiotics   Anti-infectives (From admission, onward)   Start     Dose/Rate Route Frequency Ordered Stop   04/05/17 1800  azithromycin (ZITHROMAX) tablet 500 mg     500 mg Oral Daily-1800 04/05/17 1103     04/05/17 0000  azithromycin (ZITHROMAX) 250 MG tablet        04/05/17 1204     04/05/17 0000  amoxicillin-clavulanate (AUGMENTIN) 875-125 MG tablet     1 tablet Oral 2 times daily 04/05/17 1204 04/19/17 2359   04/02/17 2100  cefTRIAXone (ROCEPHIN) 1 g in dextrose 5 % 50 mL injection     1 g Intravenous Every 24 hours 04/02/17 2017 04/09/17 2059   04/02/17 2015  cefTRIAXone (ROCEPHIN) 1 g in dextrose 5 % 50 mL IVPB - Premix  Status:  Discontinued     1 g 100 mL/hr over 30 Minutes Intravenous Every 24 hours 04/02/17 2005 04/02/17 2015   04/02/17 2015  azithromycin (ZITHROMAX) 500 mg in dextrose 5 % 250 mL IVPB  Status:  Discontinued     500 mg 250 mL/hr over 60 Minutes Intravenous Every 24 hours 04/02/17 2005 04/05/17 1102       Medications   Scheduled Meds: . aspirin  81 mg Oral Daily  . azithromycin  500 mg Oral q1800  . budesonide (PULMICORT) nebulizer solution  0.25 mg Nebulization BID  . feeding supplement (ENSURE ENLIVE)  237 mL Oral TID BM  . furosemide  20 mg Oral Daily  . guaiFENesin  600 mg Oral BID  . heparin  5,000 Units Subcutaneous Q8H  . hydrocortisone   Topical BID  . ipratropium-albuterol  3 mL Nebulization TID  . methylPREDNISolone (SOLU-MEDROL) injection  60 mg Intravenous Q6H  . multivitamin with minerals  1 tablet Oral Daily  . oxybutynin  5 mg Oral QHS  . sodium chloride flush  3 mL Intravenous Q12H   Continuous Infusions: . sodium chloride    . small volume/piggyback builder     PRN Meds:.sodium chloride, acetaminophen, hydrALAZINE, ipratropium-albuterol, magnesium hydroxide, nitroGLYCERIN, polyethylene glycol, psyllium, sodium chloride flush, traZODone   Data Review:   Micro Results Recent Results (from the past 240 hour(s))  Culture, blood (routine x 2) Call MD if unable to obtain prior to antibiotics being given     Status: None (Preliminary result)   Collection Time: 04/02/17  8:33 PM  Result Value Ref Range Status   Specimen Description BLOOD RIGHT ANTECUBITAL  Final   Special Requests   Final    BOTTLES DRAWN AEROBIC AND ANAEROBIC Blood Culture adequate volume   Culture   Final    NO GROWTH 3 DAYS Performed at [facility], [address] [place], [place] [address]    Report Status PENDING  Incomplete  Culture, blood (routine x 2) Call MD if unable to obtain prior to antibiotics being given     Status: None (Preliminary result)   Collection Time: 04/02/17  8:33 PM  Result Value Ref Range Status   Specimen Description BLOOD LEFT ANTECUBITAL  Final   Special Requests   Final    BOTTLES DRAWN AEROBIC AND ANAEROBIC Blood Culture adequate volume  Culture   Final    NO GROWTH 3 DAYS Performed at [facility], [address] [place],  [place] [address]    Report Status PENDING  Incomplete    Radiology Reports Dg Chest Portable 1 View  Result Date: 04/02/2017 CLINICAL DATA:  Initial evaluation for acute shortness of breath. EXAM: PORTABLE CHEST 1 VIEW COMPARISON:  None. FINDINGS: Cardiac and mediastinal silhouettes are stable in size and contour, and remain within normal limits. Aortic atherosclerosis. Lungs normally inflated. Underlying COPD. Small layering bilateral pleural effusions. Associated bibasilar opacities likely atelectasis, although infiltrates could be considered as well. No pulmonary edema. No pneumothorax. No acute osseus abnormality. IMPRESSION: 1. Small layering bilateral pleural effusions with associated bibasilar opacities, likely atelectasis. Infiltrates could be considered in the correct clinical setting. 2. Underlying COPD. 3. Aortic atherosclerosis. Electronically Signed   By: Rise Mu M.D.   On: 04/02/2017 15:52   Dg Shoulder Left  Result Date: 04/02/2017 CLINICAL DATA:  Initial evaluation for acute left shoulder pain. No injury. EXAM: LEFT SHOULDER - 2+ VIEW COMPARISON:  None. FINDINGS: No acute fracture or dislocation. Humeral head in normal line with the glenoid. AC joint approximated. Heterotopic calcification at the superolateral aspect of the humeral head consistent with calcific tendinopathy. Osteoarthritic changes about the [facility] joint as well. Bones are mildly osteopenic. No soft tissue abnormality. IMPRESSION: 1. No acute osseous abnormality about the left shoulder. 2. Heterotopic calcification at the superolateral aspect of the left femoral head, consistent with underlying calcific tendinopathy. 3. Mild right acromioclavicular osteoarthrosis. Electronically Signed   By: Rise Mu M.D.   On: 04/02/2017 15:50     CBC Recent Labs  Lab 04/02/17 1505 04/04/17 0530  WBC 12.3* 6.6  HGB 11.3* 11.6*  HCT 34.3* 33.8*  PLT 167 167  MCV 97.5 96.0  MCH 32.2 33.0  MCHC 33.0 34.4  RDW  16.1* 15.8*  LYMPHSABS 1.3  --   MONOABS 0.9  --   EOSABS 0.2  --   BASOSABS 0.1  --     Chemistries  Recent Labs  Lab 04/02/17 1505 04/04/17 0530  NA 133* 131*  K 3.9 3.9  CL 100* 98*  CO2 26 25  GLUCOSE 118* 151*  BUN 32* 33*  CREATININE 1.22 1.16  CALCIUM 8.7* 8.6*  AST 32  --   ALT 21  --   ALKPHOS 45  --   BILITOT 0.8  --    ------------------------------------------------------------------------------------------------------------------ estimated creatinine clearance is 25.1 mL/min (by C-G formula based on SCr of 1.16 mg/dL). ------------------------------------------------------------------------------------------------------------------ No results for input(s): HGBA1C in the last 72 hours. ------------------------------------------------------------------------------------------------------------------ No results for input(s): CHOL, HDL, LDLCALC, TRIG, CHOLHDL, LDLDIRECT in the last 72 hours. ------------------------------------------------------------------------------------------------------------------ No results for input(s): TSH, T4TOTAL, T3FREE, THYROIDAB in the last 72 hours.  Invalid input(s): FREET3 ------------------------------------------------------------------------------------------------------------------ No results for input(s): VITAMINB12, FOLATE, FERRITIN, TIBC, IRON, RETICCTPCT in the last 72 hours.  Coagulation profile No results for input(s): INR, PROTIME in the last 168 hours.  No results for input(s): DDIMER in the last 72 hours.  Cardiac Enzymes Recent Labs  Lab 04/02/17 2033 04/03/17 0251 04/03/17 0834  TROPONINI 0.50* 0.68* 0.87*   ------------------------------------------------------------------------------------------------------------------ Invalid input(s): POCBNP    Assessment & Plan   AIMPRESSION AND PLAN: 1 acute probable community-acquired pneumonia Continue Rocephin/azithromycin, supplemental oxygen   Discharge  home today with Augmentin, azithromycin, patient already has oxygen at home. 2 acute on chronic COPD exacerperation Discharge home with steroid Dosepak  3.  Elevated troponin suspect due to demand  ischemia due to his advanced age he is not a candidate for   4 acute on chronic hypoxic respiratory failure Stable Continue home regiment-on 2 L via nasal cannula at home continuous, for COPD patient already has albuterol, Spiriva.  5  malnutrition in the context of chronic illness.  Continue nutritional supplements with Ensure.  6.  CODE STATUS confirmed with the patient and family he is a DNR     Code Status Orders  (From admission, onward)        Start     Ordered   04/03/17 1120  Do not attempt resuscitation (DNR)  Continuous    Question Answer Comment  In the event of cardiac or respiratory ARREST Do not call a "code blue"   In the event of cardiac or respiratory ARREST Do not perform Intubation, CPR, defibrillation or ACLS   In the event of cardiac or respiratory ARREST Use medication by any route, position, wound care, and other measures to relive pain and suffering. May use oxygen, suction and manual treatment of airway obstruction as needed for comfort.      04/03/17 1120    Code Status History    Date Active Date Inactive Code Status Order ID Comments User Context   04/02/2017 20:05 04/03/2017 11:20 Full Code [id number]  Bertrum Sol, MD ED   03/21/2017 03:02 03/23/2017 17:35 Full Code [id number]  Bertrum Sol, MD Inpatient   03/04/2017 10:38 03/05/2017 17:53 Full Code [id number]  Adrian Saran, MD Inpatient   03/04/2017 09:59 03/04/2017 10:38 DNR [id number]  Adrian Saran, MD Inpatient   01/20/2017 04:46 01/21/2017 16:22 Full Code [id number]  Tonye Royalty, DO Inpatient   01/02/2017 19:37 01/03/2017 17:59 DNR [id number]  Milagros Loll, MD ED   11/12/2016 09:32 11/13/2016 15:37 Full Code [id number]  Gasper Lloyd, RN Inpatient   11/11/2016 11:23 11/12/2016 09:32 DNR  [id number]  Enedina Finner, MD Inpatient   10/18/2016 20:36 10/20/2016 19:52 DNR [id number]  Milagros Loll, MD ED   09/27/2016 15:00 09/28/2016 17:19 DNR [id number]  Altamese Dilling, MD Inpatient   09/24/2016 08:52 09/27/2016 15:00 Full Code [id number]  Enid Baas, MD Inpatient   08/30/2016 05:16 09/02/2016 16:04 Full Code [id number]  Arnaldo Natal, MD Inpatient   02/18/2016 08:27 02/22/2016 18:50 Full Code [id number]  Enedina Finner, MD Inpatient   10/29/2015 21:19 11/02/2015 14:22 Full Code [id number]  Enid Baas, MD Inpatient   10/07/2015 07:32 10/09/2015 16:29 Full Code [id number]  Ihor Austin, MD Inpatient   06/07/2015 00:04 06/10/2015 16:31 Full Code [id number]  Oralia Manis, MD Inpatient   01/11/2015 13:54 01/12/2015 13:58 Full Code [id number]  Alford Highland, MD ED    Advance Directive Documentation     Most Recent Value  Type of Advance Directive  Healthcare Power of Attorney, Living will  Pre-existing out of facility DNR order (yellow form or pink MOST form)  No data  "MOST" Form in Place?  No data           Consults none  DVT Prophylaxis  Lovenox  Lab Results  Component Value Date   PLT 167 04/04/2017     Time Spent in minutes   30 minutes, Discharge home today.  Greater than 50% of time spent in care coordination and counseling patient regarding the condition and plan of care.   Katha Hamming M.D on 04/05/2017 at 3:12 PM  Between 7am to 6pm - Pager - [phone]  After 6pm go to [email or web address] - password [email or web address]  [facility]   Office  [phone]  

## 2017-04-05 NOTE — Progress Notes (Signed)
PHARMACIST - PHYSICIAN COMMUNICATION DR:   Luberta MutterKonidena CONCERNING: Antibiotic IV to Oral Route Change Policy  RECOMMENDATION: This patient is receiving azithromycin by the intravenous route.  Based on criteria approved by the Pharmacy and Therapeutics Committee, the antibiotic(s) is/are being converted to the equivalent oral dose form(s).   DESCRIPTION: These criteria include:  Patient being treated for a respiratory tract infection, urinary tract infection, cellulitis or clostridium difficile associated diarrhea if on metronidazole  The patient is not neutropenic and does not exhibit a GI malabsorption state  The patient is eating (either orally or via tube) and/or has been taking other orally administered medications for a least 24 hours  The patient is improving clinically and has a Tmax < 100.5  If you have questions about this conversion, please contact the Pharmacy Department   Cindi CarbonMary M Menno Vanbergen, PharmD, BCPS Clinical Pharmacist 04/05/17 11:04 AM

## 2017-04-05 NOTE — Plan of Care (Signed)
  Progressing Education: Knowledge of General Education information will improve 04/05/2017 0103 - Progressing by Dorna LeitzNesbitt, Curstin Schmale M, RN Pain Managment: General experience of comfort will improve 04/05/2017 0103 - Progressing by Dorna LeitzNesbitt, Jeremias Broyhill M, RN Safety: Ability to remain free from injury will improve 04/05/2017 0103 - Progressing by Dorna LeitzNesbitt, Santonio Speakman M, RN Respiratory: Ability to maintain a clear airway will improve 04/05/2017 0103 - Progressing by Dorna LeitzNesbitt, Jerald Villalona M, RN

## 2017-04-05 NOTE — Telephone Encounter (Signed)
TCM....  Patient is being discharged   They saw Brion AlimentBerge on 12/11  They are scheduled to see Gollan on 1/21  They were seen for elevated troponin - CAD  They need to be seen within 1 to 2 weeks  Pt added to wait list   Please call

## 2017-04-05 NOTE — Telephone Encounter (Signed)
Left voice message to schedule patient a hospital follow up

## 2017-04-06 NOTE — Telephone Encounter (Signed)
We were consulted on patient for mildly elevated troponin felt to be demand ischemia. It was unclear why a troponin was even checked given his presenting symptom was penis pain. He was not felt to be a candidate for any invasive procedure at that time given his advanced age and comorbid conditions. He does not need to be a TCM given the above. If the family feels strongly, he can have a routine hospital follow up to discuss goals of care from a cardiac perspective. This would depend on the patient's current trajectory.

## 2017-04-06 NOTE — Telephone Encounter (Signed)
I called and spoke with the patient's daughter, Chase Picketatricia [facility](DPR). I advised her the hospital had contacted us about follow up for the patient and he had an appointment scheduled 1/21 with Dr. Mariah MillingGollan. She was not aware of this. She did not wish to keep this appointment as the patient is holding stable at this time and it is very difficult to transport him. I advised her I would cancel the patient's follow up with Dr. Mariah MillingGollan, but she can call us back if she feels like she needs us for anything.  She is agreeable.

## 2017-04-06 NOTE — Telephone Encounter (Signed)
Ryan,  Can you look at this for me for follow up?  In his chart, [facility].   Dx- acute on chronic SOB/ acute on chronic elevated troponin.   He is 89, so I'm not sure of his mobility or if the family will want to bring him in for post hospital follow up, but if so do you see a place on your schedule that I could use? Is 04/20/2017 a possibility to get him in the 1-2 week window?  Thanks!

## 2017-04-07 LAB — CULTURE, BLOOD (ROUTINE X 2)
CULTURE: NO GROWTH
Culture: NO GROWTH
SPECIAL REQUESTS: ADEQUATE
Special Requests: ADEQUATE

## 2017-04-08 ENCOUNTER — Inpatient Hospital Stay
Admission: EM | Admit: 2017-04-08 | Discharge: 2017-05-12 | DRG: 871 | Disposition: E | Payer: Medicare Other | Attending: Internal Medicine | Admitting: Internal Medicine

## 2017-04-08 ENCOUNTER — Emergency Department: Payer: Medicare Other

## 2017-04-08 ENCOUNTER — Other Ambulatory Visit: Payer: Self-pay

## 2017-04-08 DIAGNOSIS — F0390 Unspecified dementia without behavioral disturbance: Secondary | ICD-10-CM | POA: Diagnosis present

## 2017-04-08 DIAGNOSIS — I255 Ischemic cardiomyopathy: Secondary | ICD-10-CM | POA: Diagnosis present

## 2017-04-08 DIAGNOSIS — A419 Sepsis, unspecified organism: Principal | ICD-10-CM | POA: Diagnosis present

## 2017-04-08 DIAGNOSIS — L8990 Pressure ulcer of unspecified site, unspecified stage: Secondary | ICD-10-CM

## 2017-04-08 DIAGNOSIS — Z7189 Other specified counseling: Secondary | ICD-10-CM | POA: Diagnosis not present

## 2017-04-08 DIAGNOSIS — N400 Enlarged prostate without lower urinary tract symptoms: Secondary | ICD-10-CM | POA: Diagnosis present

## 2017-04-08 DIAGNOSIS — J189 Pneumonia, unspecified organism: Secondary | ICD-10-CM | POA: Diagnosis present

## 2017-04-08 DIAGNOSIS — I214 Non-ST elevation (NSTEMI) myocardial infarction: Secondary | ICD-10-CM | POA: Diagnosis present

## 2017-04-08 DIAGNOSIS — F4310 Post-traumatic stress disorder, unspecified: Secondary | ICD-10-CM | POA: Diagnosis present

## 2017-04-08 DIAGNOSIS — Z66 Do not resuscitate: Secondary | ICD-10-CM | POA: Diagnosis present

## 2017-04-08 DIAGNOSIS — N390 Urinary tract infection, site not specified: Secondary | ICD-10-CM | POA: Diagnosis present

## 2017-04-08 DIAGNOSIS — R0603 Acute respiratory distress: Secondary | ICD-10-CM | POA: Diagnosis not present

## 2017-04-08 DIAGNOSIS — Z9981 Dependence on supplemental oxygen: Secondary | ICD-10-CM | POA: Diagnosis not present

## 2017-04-08 DIAGNOSIS — Z955 Presence of coronary angioplasty implant and graft: Secondary | ICD-10-CM

## 2017-04-08 DIAGNOSIS — R0602 Shortness of breath: Secondary | ICD-10-CM | POA: Diagnosis present

## 2017-04-08 DIAGNOSIS — J440 Chronic obstructive pulmonary disease with acute lower respiratory infection: Secondary | ICD-10-CM | POA: Diagnosis present

## 2017-04-08 DIAGNOSIS — R0902 Hypoxemia: Secondary | ICD-10-CM | POA: Diagnosis not present

## 2017-04-08 DIAGNOSIS — I5023 Acute on chronic systolic (congestive) heart failure: Secondary | ICD-10-CM | POA: Diagnosis present

## 2017-04-08 DIAGNOSIS — Z7951 Long term (current) use of inhaled steroids: Secondary | ICD-10-CM

## 2017-04-08 DIAGNOSIS — J441 Chronic obstructive pulmonary disease with (acute) exacerbation: Secondary | ICD-10-CM | POA: Diagnosis present

## 2017-04-08 DIAGNOSIS — Z7982 Long term (current) use of aspirin: Secondary | ICD-10-CM

## 2017-04-08 DIAGNOSIS — Z825 Family history of asthma and other chronic lower respiratory diseases: Secondary | ICD-10-CM

## 2017-04-08 DIAGNOSIS — Z681 Body mass index (BMI) 19 or less, adult: Secondary | ICD-10-CM | POA: Diagnosis not present

## 2017-04-08 DIAGNOSIS — Z888 Allergy status to other drugs, medicaments and biological substances status: Secondary | ICD-10-CM

## 2017-04-08 DIAGNOSIS — E43 Unspecified severe protein-calorie malnutrition: Secondary | ICD-10-CM | POA: Diagnosis present

## 2017-04-08 DIAGNOSIS — Z86718 Personal history of other venous thrombosis and embolism: Secondary | ICD-10-CM | POA: Diagnosis not present

## 2017-04-08 DIAGNOSIS — J9621 Acute and chronic respiratory failure with hypoxia: Secondary | ICD-10-CM | POA: Diagnosis present

## 2017-04-08 DIAGNOSIS — Z515 Encounter for palliative care: Secondary | ICD-10-CM | POA: Diagnosis present

## 2017-04-08 DIAGNOSIS — Y95 Nosocomial condition: Secondary | ICD-10-CM | POA: Diagnosis present

## 2017-04-08 DIAGNOSIS — I2510 Atherosclerotic heart disease of native coronary artery without angina pectoris: Secondary | ICD-10-CM | POA: Diagnosis present

## 2017-04-08 DIAGNOSIS — E782 Mixed hyperlipidemia: Secondary | ICD-10-CM | POA: Diagnosis present

## 2017-04-08 DIAGNOSIS — I110 Hypertensive heart disease with heart failure: Secondary | ICD-10-CM | POA: Diagnosis present

## 2017-04-08 DIAGNOSIS — Z87891 Personal history of nicotine dependence: Secondary | ICD-10-CM

## 2017-04-08 DIAGNOSIS — I252 Old myocardial infarction: Secondary | ICD-10-CM | POA: Diagnosis not present

## 2017-04-08 DIAGNOSIS — Z79899 Other long term (current) drug therapy: Secondary | ICD-10-CM

## 2017-04-08 LAB — CBC WITH DIFFERENTIAL/PLATELET
BASOS ABS: 0.1 10*3/uL (ref 0–0.1)
BASOS PCT: 0 %
EOS PCT: 0 %
Eosinophils Absolute: 0 10*3/uL (ref 0–0.7)
HEMATOCRIT: 36 % — AB (ref 40.0–52.0)
Hemoglobin: 11.8 g/dL — ABNORMAL LOW (ref 13.0–18.0)
Lymphocytes Relative: 4 %
Lymphs Abs: 0.8 10*3/uL — ABNORMAL LOW (ref 1.0–3.6)
MCH: 32.2 pg (ref 26.0–34.0)
MCHC: 32.8 g/dL (ref 32.0–36.0)
MCV: 98.3 fL (ref 80.0–100.0)
MONO ABS: 0.9 10*3/uL (ref 0.2–1.0)
MONOS PCT: 5 %
Neutro Abs: 18.1 10*3/uL — ABNORMAL HIGH (ref 1.4–6.5)
Neutrophils Relative %: 91 %
PLATELETS: 191 10*3/uL (ref 150–440)
RBC: 3.66 MIL/uL — ABNORMAL LOW (ref 4.40–5.90)
RDW: 16.4 % — AB (ref 11.5–14.5)
WBC: 19.9 10*3/uL — ABNORMAL HIGH (ref 3.8–10.6)

## 2017-04-08 LAB — URINALYSIS, COMPLETE (UACMP) WITH MICROSCOPIC
Bilirubin Urine: NEGATIVE
GLUCOSE, UA: NEGATIVE mg/dL
KETONES UR: NEGATIVE mg/dL
Leukocytes, UA: NEGATIVE
NITRITE: NEGATIVE
PH: 6 (ref 5.0–8.0)
Protein, ur: 100 mg/dL — AB
SPECIFIC GRAVITY, URINE: 1.02 (ref 1.005–1.030)
Squamous Epithelial / LPF: NONE SEEN

## 2017-04-08 LAB — INFLUENZA PANEL BY PCR (TYPE A & B)
Influenza A By PCR: NEGATIVE
Influenza B By PCR: NEGATIVE

## 2017-04-08 LAB — COMPREHENSIVE METABOLIC PANEL
ALBUMIN: 3.1 g/dL — AB (ref 3.5–5.0)
ALK PHOS: 51 U/L (ref 38–126)
ALT: 46 U/L (ref 17–63)
AST: 43 U/L — AB (ref 15–41)
Anion gap: 7 (ref 5–15)
BILIRUBIN TOTAL: 1.6 mg/dL — AB (ref 0.3–1.2)
BUN: 53 mg/dL — AB (ref 6–20)
CALCIUM: 9.8 mg/dL (ref 8.9–10.3)
CO2: 31 mmol/L (ref 22–32)
CREATININE: 1.08 mg/dL (ref 0.61–1.24)
Chloride: 100 mmol/L — ABNORMAL LOW (ref 101–111)
GFR calc Af Amer: >60 mL/min (ref >60)
GFR calc non Af Amer: 53 mL/min — ABNORMAL LOW (ref >60)
GLUCOSE: 132 mg/dL — AB (ref 65–99)
Potassium: 4.3 mmol/L (ref 3.5–5.1)
SODIUM: 138 mmol/L (ref 135–145)
TOTAL PROTEIN: 6.4 g/dL — AB (ref 6.5–8.1)

## 2017-04-08 LAB — TROPONIN I
Troponin I: 0.3 ng/mL (ref <0.03)
Troponin I: 1.42 ng/mL (ref <0.03)
Troponin I: 1.85 ng/mL (ref <0.03)
Troponin I: 1.83 ng/mL (ref <0.03)

## 2017-04-08 LAB — LACTIC ACID, PLASMA
LACTIC ACID, VENOUS: 2 mmol/L — AB (ref 0.5–1.9)
Lactic Acid, Venous: 2.4 mmol/L (ref 0.5–1.9)

## 2017-04-08 LAB — BRAIN NATRIURETIC PEPTIDE: B Natriuretic Peptide: 363 pg/mL — ABNORMAL HIGH (ref 0.0–100.0)

## 2017-04-08 MED ORDER — ONDANSETRON HCL 4 MG PO TABS: 4.0000 mg | ORAL_TABLET | Freq: Four times a day (QID) | ORAL | Status: DC | PRN

## 2017-04-08 MED ORDER — FUROSEMIDE 10 MG/ML IJ SOLN
40.0000 mg | INTRAMUSCULAR | Status: AC
  Administered 2017-04-08: 40 mg via INTRAVENOUS
  Filled 2017-04-08: qty 4

## 2017-04-08 MED ORDER — VANCOMYCIN HCL IN DEXTROSE 1-5 GM/200ML-% IV SOLN
1000.0000 mg | Freq: Once | INTRAVENOUS | Status: AC
  Administered 2017-04-08: 1000 mg via INTRAVENOUS
  Filled 2017-04-08: qty 200

## 2017-04-08 MED ORDER — VANCOMYCIN HCL IN DEXTROSE 1-5 GM/200ML-% IV SOLN
1000.0000 mg | INTRAVENOUS | Status: DC
  Administered 2017-04-09: 1000 mg via INTRAVENOUS
  Filled 2017-04-08 (×2): qty 200

## 2017-04-08 MED ORDER — METHYLPREDNISOLONE SODIUM SUCC 125 MG IJ SOLR
60.0000 mg | INTRAMUSCULAR | Status: DC
  Administered 2017-04-08 – 2017-04-09 (×2): 60 mg via INTRAVENOUS
  Filled 2017-04-08 (×2): qty 2

## 2017-04-08 MED ORDER — METOPROLOL TARTRATE 25 MG PO TABS
25.0000 mg | ORAL_TABLET | Freq: Two times a day (BID) | ORAL | Status: DC
Start: 2017-04-08 — End: 2017-04-13
  Administered 2017-04-08 – 2017-04-12 (×9): 25 mg via ORAL
  Filled 2017-04-08 (×10): qty 1

## 2017-04-08 MED ORDER — ORAL CARE MOUTH RINSE
15.0000 mL | Freq: Two times a day (BID) | OROMUCOSAL | Status: DC
  Administered 2017-04-08 – 2017-04-13 (×5): 15 mL via OROMUCOSAL

## 2017-04-08 MED ORDER — BUDESONIDE 0.25 MG/2ML IN SUSP
0.2500 mg | Freq: Two times a day (BID) | RESPIRATORY_TRACT | Status: DC
  Administered 2017-04-08: 0.25 mg via RESPIRATORY_TRACT
  Filled 2017-04-08: qty 2

## 2017-04-08 MED ORDER — NITROGLYCERIN 0.4 MG SL SUBL: 0.4000 mg | SUBLINGUAL_TABLET | SUBLINGUAL | Status: DC | PRN

## 2017-04-08 MED ORDER — ASPIRIN 81 MG PO CHEW
81.0000 mg | CHEWABLE_TABLET | Freq: Every day | ORAL | Status: DC
  Administered 2017-04-08 – 2017-04-11 (×4): 81 mg via ORAL
  Filled 2017-04-08 (×5): qty 1

## 2017-04-08 MED ORDER — FUROSEMIDE 20 MG PO TABS
20.0000 mg | ORAL_TABLET | Freq: Every day | ORAL | Status: DC
  Administered 2017-04-10 – 2017-04-11 (×2): 20 mg via ORAL
  Filled 2017-04-08 (×2): qty 1

## 2017-04-08 MED ORDER — ADULT MULTIVITAMIN W/MINERALS CH
1.0000 | ORAL_TABLET | Freq: Every day | ORAL | Status: DC
  Administered 2017-04-08 – 2017-04-11 (×4): 1 via ORAL
  Filled 2017-04-08 (×5): qty 1

## 2017-04-08 MED ORDER — IPRATROPIUM-ALBUTEROL 0.5-2.5 (3) MG/3ML IN SOLN
3.0000 mL | RESPIRATORY_TRACT | Status: DC
  Administered 2017-04-08 – 2017-04-13 (×33): 3 mL via RESPIRATORY_TRACT
  Filled 2017-04-08 (×35): qty 3

## 2017-04-08 MED ORDER — BOOST / RESOURCE BREEZE PO LIQD
1.0000 | Freq: Three times a day (TID) | ORAL | Status: DC
  Administered 2017-04-08 – 2017-04-09 (×3): 1 via ORAL
  Filled 2017-04-08: qty 1

## 2017-04-08 MED ORDER — MAGNESIUM HYDROXIDE 400 MG/5ML PO SUSP: 30.0000 mL | Freq: Two times a day (BID) | ORAL | Status: DC | PRN

## 2017-04-08 MED ORDER — HYDROCORTISONE 2.5 % RE CREA
TOPICAL_CREAM | Freq: Two times a day (BID) | RECTAL | Status: DC
  Administered 2017-04-08 – 2017-04-09 (×2): via TOPICAL
  Filled 2017-04-08: qty 28.35

## 2017-04-08 MED ORDER — MORPHINE SULFATE (PF) 2 MG/ML IV SOLN
2.0000 mg | Freq: Once | INTRAVENOUS | Status: AC
  Administered 2017-04-08: 2 mg via INTRAVENOUS
  Filled 2017-04-08: qty 1

## 2017-04-08 MED ORDER — ALBUTEROL SULFATE (2.5 MG/3ML) 0.083% IN NEBU: 2.5000 mg | INHALATION_SOLUTION | RESPIRATORY_TRACT | Status: DC | PRN

## 2017-04-08 MED ORDER — DEXTROSE 5 % IV SOLN
2.0000 g | Freq: Once | INTRAVENOUS | Status: AC
  Administered 2017-04-08: 2 g via INTRAVENOUS
  Filled 2017-04-08: qty 2

## 2017-04-08 MED ORDER — ENOXAPARIN SODIUM 30 MG/0.3ML ~~LOC~~ SOLN
30.0000 mg | SUBCUTANEOUS | Status: DC
  Administered 2017-04-08 – 2017-04-11 (×4): 30 mg via SUBCUTANEOUS
  Filled 2017-04-08 (×4): qty 0.3

## 2017-04-08 MED ORDER — CLOPIDOGREL BISULFATE 75 MG PO TABS
300.0000 mg | ORAL_TABLET | Freq: Once | ORAL | Status: AC
  Administered 2017-04-08: 300 mg via ORAL
  Filled 2017-04-08: qty 4

## 2017-04-08 MED ORDER — DEXTROSE 5 % IV SOLN
INTRAVENOUS | Status: AC
  Filled 2017-04-08: qty 1

## 2017-04-08 MED ORDER — ALBUTEROL SULFATE (2.5 MG/3ML) 0.083% IN NEBU
2.5000 mg | INHALATION_SOLUTION | RESPIRATORY_TRACT | Status: DC | PRN
  Administered 2017-04-09 – 2017-04-10 (×2): 2.5 mg via RESPIRATORY_TRACT
  Filled 2017-04-08 (×2): qty 3

## 2017-04-08 MED ORDER — IPRATROPIUM-ALBUTEROL 0.5-2.5 (3) MG/3ML IN SOLN
3.0000 mL | Freq: Once | RESPIRATORY_TRACT | Status: AC
  Administered 2017-04-08: 3 mL via RESPIRATORY_TRACT

## 2017-04-08 MED ORDER — BUDESONIDE 0.25 MG/2ML IN SUSP
0.2500 mg | Freq: Two times a day (BID) | RESPIRATORY_TRACT | Status: DC
  Administered 2017-04-09 – 2017-04-13 (×10): 0.25 mg via RESPIRATORY_TRACT
  Filled 2017-04-08 (×11): qty 2

## 2017-04-08 MED ORDER — PSYLLIUM 95 % PO PACK
1.0000 | PACK | Freq: Every day | ORAL | Status: DC
  Administered 2017-04-08 – 2017-04-10 (×3): 1 via ORAL
  Filled 2017-04-08 (×6): qty 1

## 2017-04-08 MED ORDER — DOCUSATE SODIUM 100 MG PO CAPS
100.0000 mg | ORAL_CAPSULE | Freq: Two times a day (BID) | ORAL | Status: DC
  Administered 2017-04-08 – 2017-04-12 (×8): 100 mg via ORAL
  Filled 2017-04-08 (×9): qty 1

## 2017-04-08 MED ORDER — IPRATROPIUM-ALBUTEROL 0.5-2.5 (3) MG/3ML IN SOLN
RESPIRATORY_TRACT | Status: AC
  Administered 2017-04-08: 3 mL via RESPIRATORY_TRACT
  Filled 2017-04-08: qty 3

## 2017-04-08 MED ORDER — ACETAMINOPHEN 325 MG PO TABS: 650.0000 mg | ORAL_TABLET | Freq: Four times a day (QID) | ORAL | Status: DC | PRN

## 2017-04-08 MED ORDER — TIOTROPIUM BROMIDE MONOHYDRATE 18 MCG IN CAPS
18.0000 ug | ORAL_CAPSULE | Freq: Every evening | RESPIRATORY_TRACT | Status: DC
  Administered 2017-04-08 – 2017-04-12 (×5): 18 ug via RESPIRATORY_TRACT
  Filled 2017-04-08: qty 5

## 2017-04-08 MED ORDER — ONDANSETRON HCL 4 MG/2ML IJ SOLN: 4.0000 mg | Freq: Four times a day (QID) | INTRAMUSCULAR | Status: DC | PRN

## 2017-04-08 MED ORDER — ACETAMINOPHEN 650 MG RE SUPP: 650.0000 mg | Freq: Four times a day (QID) | RECTAL | Status: DC | PRN

## 2017-04-08 MED ORDER — AZITHROMYCIN 250 MG PO TABS
250.0000 mg | ORAL_TABLET | Freq: Every day | ORAL | Status: AC
  Administered 2017-04-08 – 2017-04-10 (×3): 250 mg via ORAL
  Filled 2017-04-08 (×3): qty 1

## 2017-04-08 MED ORDER — DEXTROSE 5 % IV SOLN
1.0000 g | INTRAVENOUS | Status: DC
  Administered 2017-04-08 – 2017-04-09 (×2): 1 g via INTRAVENOUS
  Filled 2017-04-08 (×4): qty 1

## 2017-04-08 MED ORDER — OXYBUTYNIN CHLORIDE ER 5 MG PO TB24
5.0000 mg | ORAL_TABLET | Freq: Every day | ORAL | Status: DC
  Administered 2017-04-08 – 2017-04-11 (×4): 5 mg via ORAL
  Filled 2017-04-08 (×5): qty 1

## 2017-04-08 MED ORDER — SODIUM CHLORIDE 0.9 % IV SOLN
INTRAVENOUS | Status: DC
  Administered 2017-04-08: 13:00:00 via INTRAVENOUS

## 2017-04-08 MED ORDER — CLOPIDOGREL BISULFATE 75 MG PO TABS
75.0000 mg | ORAL_TABLET | Freq: Every day | ORAL | Status: DC
  Administered 2017-04-09 – 2017-04-11 (×3): 75 mg via ORAL
  Filled 2017-04-08 (×4): qty 1

## 2017-04-08 MED ORDER — POLYETHYLENE GLYCOL 3350 17 G PO PACK
17.0000 g | PACK | Freq: Every day | ORAL | Status: DC
  Administered 2017-04-09 – 2017-04-10 (×2): 17 g via ORAL
  Filled 2017-04-08 (×4): qty 1

## 2017-04-08 NOTE — Consult Note (Signed)
Daniel GoldsGeorge Henry Dickson is a [age] y.o. male  [id number]  Primary Cardiologist:   [facility]ology[facility] for Consultation:  Elevated troponin  HPI:  This is 83 year old Daniel Dickson male with a past medical history of coronary artery disease cardiomyopathy with ejection fraction about 40-45% status post PCI presented to the hospital with shortness of breath and was found to be in congestive heart failure and had elevated troponin.   Review of Systems:  No chest pain but feels very short of breath   Past Medical History:  Diagnosis Date  . Anxiety   . BPH (benign prostatic hyperplasia)    a. complicated by urinary retention req chronic foley.  . Cellulitis    a. 10/2016 - Right leg.  . Chronic systolic CHF (congestive heart failure) (HCC)    a. 02/2016 Echo: EF 40-45%, no rwma, mildly dil LA.  Marland Kitchen. Chronic Urinary retention    a. Chronic indwelling foley.  Marland Kitchen. COPD (chronic obstructive pulmonary disease) (HCC)    a. On chronic supplemental O2 via [place].  Marland Kitchen. Coronary artery disease    a. 03/2011 Inflat STEMI/PCI ([facility]): LM nl, LAD 50p, LCX 5044m, RI nl, RCA 100p (2.75x22 MDT Integrity BMS)-->post procedure course complicated by RFV DVT req coumadin and then R SFA PSA & hematoma req surgical evacuation.  . DVT (deep venous thrombosis) (HCC)    a. 03/2011 R Femoral Vein DVT following Cath/PCI.  Marland Kitchen. Hematuria   . Hemorrhoids   . History of GI diverticular bleed    a. 02/2017 - rectal bleeding felt to be diverticular-->colonoscopy deferrred.  . Hypertensive heart disease   . Ischemic cardiomyopathy    a. 02/2016 Echo: EF 40-45%.  . Mixed hyperlipidemia   . PTSD (post-traumatic stress disorder)    with headaches  . Right SFA Pseudoaneurysm (HCC)    a. 03/2011 following Cath/PCI-->complicated by hematoma req surgical evacuation.    Medications Prior to Admission  Medication Sig Dispense Refill  . acetaminophen (TYLENOL) 500 MG tablet Take 500 mg by mouth daily as needed for mild pain or moderate pain.      Marland Kitchen. albuterol (PROAIR HFA) 108 (90 BASE) MCG/ACT inhaler Inhale 2 puffs into the lungs every 6 (six) hours as needed. For wheezing.    Marland Kitchen. albuterol (PROVENTIL) (2.5 MG/3ML) 0.083% nebulizer solution Take 3 mLs (2.5 mg total) by nebulization every 4 (four) hours as needed for wheezing or shortness of breath. 200 mL 12  . amoxicillin-clavulanate (AUGMENTIN) 875-125 MG tablet Take 1 tablet by mouth 2 (two) times daily for 14 days. 28 tablet 0  . aspirin (ASPIRIN CHILDRENS) 81 MG chewable tablet Chew 1 tablet (81 mg total) by mouth daily. 36 tablet 11  . azithromycin (ZITHROMAX) 250 MG tablet Daily for 3 days 3 each 0  . budesonide (PULMICORT) 0.25 MG/2ML nebulizer solution Take 2 mLs (0.25 mg total) by nebulization 2 (two) times daily. 60 mL 12  . furosemide (LASIX) 40 MG tablet Take 0.5 tablets (20 mg total) by mouth daily. 30 tablet 1  . ipratropium-albuterol (DUONEB) 0.5-2.5 (3) MG/3ML SOLN Take 3 mLs by nebulization every 4 (four) hours. 3 mL 30  . magnesium hydroxide (MILK OF MAGNESIA) 400 MG/5ML suspension Take 30 mLs by mouth 2 (two) times daily as needed for mild constipation or moderate constipation. 360 mL 0  . Multiple Vitamin (MULTIVITAMIN WITH MINERALS) TABS tablet Take 1 tablet by mouth daily. 30 tablet 0  . nitroGLYCERIN (NITROSTAT) 0.4 MG SL tablet Place 0.4 mg under the tongue every 5 (five)  minutes as needed for chest pain. Reported on 10/19/2015    . oxybutynin (DITROPAN-XL) 5 MG 24 hr tablet Take 1 tablet (5 mg total) by mouth at bedtime. 30 tablet 0  . polyethylene glycol (MIRALAX / GLYCOLAX) packet Take 17 g by mouth daily. 14 each 0  . predniSONE (STERAPRED UNI-PAK 21 TAB) 10 MG (21) TBPK tablet Taper by 10 mg daily 21 tablet 0  . psyllium (HYDROCIL/METAMUCIL) 95 % PACK Take 1 packet by mouth daily. 56 each   . tiotropium (SPIRIVA) 18 MCG inhalation capsule Place 1 capsule (18 mcg total) into inhaler and inhale every evening. 30 capsule 12  . feeding supplement (BOOST / RESOURCE  BREEZE) LIQD Take 1 Container by mouth 3 (three) times daily between meals. 90 Container 5  . hydrocortisone (ANUSOL-HC) 2.5 % rectal cream Apply topically 2 (two) times daily. 30 g 0     . aspirin  81 mg Oral Daily  . azithromycin  250 mg Oral Daily  . budesonide  0.25 mg Nebulization BID  . docusate sodium  100 mg Oral BID  . enoxaparin (LOVENOX) injection  30 mg Subcutaneous Q24H  . feeding supplement  1 Container Oral TID BM  . [START ON 04/09/2017] furosemide  20 mg Oral Daily  . hydrocortisone   Topical BID  . ipratropium-albuterol  3 mL Nebulization Q4H  . mouth rinse  15 mL Mouth Rinse BID  . methylPREDNISolone (SOLU-MEDROL) injection  60 mg Intravenous Q24H  . metoprolol tartrate  25 mg Oral BID  . multivitamin with minerals  1 tablet Oral Daily  . oxybutynin  5 mg Oral QHS  . polyethylene glycol  17 g Oral Daily  . psyllium  1 packet Oral Daily  . tiotropium  18 mcg Inhalation QPM    Infusions: . sodium chloride 75 mL/hr at 03/24/2017 1328  . ceFEPime (MAXIPIME) IV    . [START ON 04/09/2017] vancomycin      Allergies  Allergen Reactions  . Iodine ([place]) [Iodine] Nausea And Vomiting    Social History   Socioeconomic History  . Marital status: Widowed    Spouse name: Not on file  . Number of children: Not on file  . Years of education: Not on file  . Highest education level: Not on file  Social Needs  . Financial resource strain: Not on file  . Food insecurity - worry: Not on file  . Food insecurity - inability: Not on file  . Transportation needs - medical: Not on file  . Transportation needs - non-medical: Not on file  Occupational History  . Occupation: retired  Tobacco Use  . Smoking status: Former Smoker    Packs/day: 3.00    Years: 40.00    Pack years: 120.00    Types: Cigarettes  . Smokeless tobacco: Never Used  . Tobacco comment: quit 62 years  Substance and Sexual Activity  . Alcohol use: Yes    Alcohol/week: 1.8 oz    Types: 3 Glasses of  wine per week    Comment: glass of wine at night  . Drug use: No  . Sexual activity: Not Currently  Other Topics Concern  . Not on file  Social History Narrative   Lives in [place] with his 81 y/o son, who has Downs Syndrome.  Pts dtr is a Engineer, civil (consulting)nurse and lives next door.  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    Family History  Problem Relation Age of Onset  . COPD Father   .  Bladder Cancer Neg Hx   . Kidney cancer Neg Hx   . Prostate cancer Neg Hx     PHYSICAL EXAM: Vitals:   03/12/2017 1511 04/07/2017 1759  BP:  140/87  Pulse:  (!) 107  Resp:  20  Temp:    SpO2: 98% 100%     Intake/Output Summary (Last 24 hours) at 04/09/2017 1844 Last data filed at 03/12/2017 1826 Gross per 24 hour  Intake 372.5 ml  Output 1200 ml  Net -827.5 ml    General:  Well appearing. No respiratory difficulty HEENT: normal Neck: supple. no JVD. Carotids 2+ bilat; no bruits. No lymphadenopathy or thryomegaly appreciated. Cor: PMI nondisplaced. Regular rate & rhythm. No rubs, gallops or murmurs. Lungs: clear Abdomen: soft, nontender, nondistended. No hepatosplenomegaly. No bruits or masses. Good bowel sounds. Extremities: no cyanosis, clubbing, rash, edema Neuro: alert & oriented x 3, cranial nerves grossly intact. moves all 4 extremities w/o difficulty. Affect pleasant.  ECG:  Sinus tachycardia with nonspecific ST-T changes  Results for orders placed or performed during the hospital encounter of 03/29/2017 (from the past 24 hour(s))  Urinalysis, Complete w Microscopic     Status: Abnormal   Collection Time: 04/04/2017  5:24 AM  Result Value Ref Range   Color, Urine YELLOW (A) YELLOW   APPearance CLEAR (A) CLEAR   Specific Gravity, Urine 1.020 1.005 - 1.030   pH 6.0 5.0 - 8.0   Glucose, UA NEGATIVE NEGATIVE mg/dL   Hgb urine dipstick MODERATE (A) NEGATIVE   Bilirubin Urine NEGATIVE NEGATIVE   Ketones, ur NEGATIVE NEGATIVE mg/dL   Protein, ur 811 (A) NEGATIVE mg/dL   Nitrite  NEGATIVE NEGATIVE   Leukocytes, UA NEGATIVE NEGATIVE   RBC / HPF TOO NUMEROUS TO COUNT 0 - 5 RBC/hpf   WBC, UA 6-30 0 - 5 WBC/hpf   Bacteria, UA RARE (A) NONE SEEN   Squamous Epithelial / LPF NONE SEEN NONE SEEN   Mucus PRESENT    Budding Yeast PRESENT   Troponin I     Status: Abnormal   Collection Time: 04/09/2017  5:24 AM  Result Value Ref Range   Troponin I 0.30 (HH) <0.03 ng/mL  Brain natriuretic peptide     Status: Abnormal   Collection Time: 04/02/2017  5:24 AM  Result Value Ref Range   B Natriuretic Peptide 363.0 (H) 0.0 - 100.0 pg/mL  Comprehensive metabolic panel     Status: Abnormal   Collection Time: 04/06/2017  5:24 AM  Result Value Ref Range   Sodium 138 135 - 145 mmol/L   Potassium 4.3 3.5 - 5.1 mmol/L   Chloride 100 (L) 101 - 111 mmol/L   CO2 31 22 - 32 mmol/L   Glucose, Bld 132 (H) 65 - 99 mg/dL   BUN 53 (H) 6 - 20 mg/dL   Creatinine, Ser 9.14 0.61 - 1.24 mg/dL   Calcium 9.8 8.9 - 78.2 mg/dL   Total Protein 6.4 (L) 6.5 - 8.1 g/dL   Albumin 3.1 (L) 3.5 - 5.0 g/dL   AST 43 (H) 15 - 41 U/L   ALT 46 17 - 63 U/L   Alkaline Phosphatase 51 38 - 126 U/L   Total Bilirubin 1.6 (H) 0.3 - 1.2 mg/dL   GFR calc non Af Amer 53 (L) >60 mL/min   GFR calc Af Amer >60 >60 mL/min   Anion gap 7 5 - 15  CBC with Differential     Status: Abnormal   Collection Time: 03/18/2017  5:24 AM  Result Value Ref Range   WBC 19.9 (H) 3.8 - 10.6 K/uL   RBC 3.66 (L) 4.40 - 5.90 MIL/uL   Hemoglobin 11.8 (L) 13.0 - 18.0 g/dL   HCT 16.1 (L) 09.6 - 04.5 %   MCV 98.3 80.0 - 100.0 fL   MCH 32.2 26.0 - 34.0 pg   MCHC 32.8 32.0 - 36.0 g/dL   RDW 40.9 (H) 81.1 - 91.4 %   Platelets 191 150 - 440 K/uL   Neutrophils Relative % 91 %   Neutro Abs 18.1 (H) 1.4 - 6.5 K/uL   Lymphocytes Relative 4 %   Lymphs Abs 0.8 (L) 1.0 - 3.6 K/uL   Monocytes Relative 5 %   Monocytes Absolute 0.9 0.2 - 1.0 K/uL   Eosinophils Relative 0 %   Eosinophils Absolute 0.0 0 - 0.7 K/uL   Basophils Relative 0 %   Basophils  Absolute 0.1 0 - 0.1 K/uL  Culture, blood (routine x 2)     Status: None (Preliminary result)   Collection Time: 05-06-17  5:28 AM  Result Value Ref Range   Specimen Description BLOOD LEFT ANTECUBITAL    Special Requests      BOTTLES DRAWN AEROBIC AND ANAEROBIC Blood Culture adequate volume   Culture      NO GROWTH < 12 HOURS Performed at [facility], [address], [place], [place] [address]    Report Status PENDING   Culture, blood (routine x 2)     Status: None (Preliminary result)   Collection Time: 05/06/2017  5:35 AM  Result Value Ref Range   Specimen Description BLOOD RIGHT ARM    Special Requests      BOTTLES DRAWN AEROBIC AND ANAEROBIC Blood Culture adequate volume   Culture      NO GROWTH < 12 HOURS Performed at [facility], [address] [place], [place] [address]    Report Status PENDING   Influenza panel by PCR (type A & B)     Status: None   Collection Time: 06-May-2017  5:44 AM  Result Value Ref Range   Influenza A By PCR NEGATIVE NEGATIVE   Influenza B By PCR NEGATIVE NEGATIVE  Lactic acid, plasma     Status: Abnormal   Collection Time: May 06, 2017  6:48 AM  Result Value Ref Range   Lactic Acid, Venous 2.4 (HH) 0.5 - 1.9 mmol/L  Lactic acid, plasma     Status: Abnormal   Collection Time: 06-May-2017  9:03 AM  Result Value Ref Range   Lactic Acid, Venous 2.0 (HH) 0.5 - 1.9 mmol/L  Troponin I     Status: Abnormal   Collection Time: 06-May-2017 10:31 AM  Result Value Ref Range   Troponin I 1.42 (HH) <0.03 ng/mL  Troponin I     Status: Abnormal   Collection Time: 05/06/2017  5:01 PM  Result Value Ref Range   Troponin I 1.85 (HH) <0.03 ng/mL   Dg Chest Portable 1 View  Result Date: 2017-05-06 CLINICAL DATA:  Acute onset of shortness of breath. EXAM: PORTABLE CHEST 1 VIEW COMPARISON:  Chest radiograph performed 04/02/2017 FINDINGS: The lungs are hyperexpanded, with underlying emphysema. Small bilateral pleural effusions are noted. Mild bibasilar  opacities may reflect interstitial edema. There is no evidence of pneumothorax. The cardiomediastinal silhouette is within normal limits. No acute osseous abnormalities are seen. IMPRESSION: 1. Small bilateral pleural effusions. Mild bibasilar opacities may reflect interstitial edema. 2. Lungs hyperexpanded, with underlying emphysema. Electronically Signed   By: Roanna Raider  M.D.   On: 03/17/2017 05:44     ASSESSMENT AND PLAN:  Non-STEMI with elevated troponin and ischemic changes on EKG and congestive heart failure. Given the situation invasive procedure is not indicated but will however treat the patient aggressively with medications. Also will get echocardiogram to look at his ejection fraction and wall motion.  Bridgitt Raggio A

## 2017-04-08 NOTE — ED Notes (Addendum)
Called report to Casey BurkittAshley William    RN

## 2017-04-08 NOTE — ED Notes (Signed)
Pt weaned off BiPap and placed on O2 4L [place].  Currently no s/sx of resp distress.

## 2017-04-08 NOTE — Progress Notes (Signed)
Dr Marisa SeverinSiadecki notified of critical troponin value.

## 2017-04-08 NOTE — Progress Notes (Signed)
Patient admitted to unit. Oriented to room, call bell, and staff. Bed in lowest position. Fall safety plan reviewed. Full assessment to Epic. Skin assessment verified with Darletta MollAshley Williams RN. Telemetry box verification with tele clerk- Box#: [id number]. Will continue to monitor.

## 2017-04-08 NOTE — ED Notes (Signed)
Dr Diamond at bedside. 

## 2017-04-08 NOTE — Progress Notes (Signed)
Dr. Luberta MutterKonidena notified of elevated troponin. No new orders.

## 2017-04-08 NOTE — ED Provider Notes (Signed)
n   [facility] Emergency Department Provider Note ____________________________________________   First MD Initiated Contact with Patient 04/07/2017 [phone]     (approximate)  I have reviewed the triage vital signs and the nursing notes.   HISTORY  Chief Complaint Shortness of Breath    HPI Daniel Dickson is a [age] y.o. male with past medical history as noted below who presents with shortness of breath, acute onset over the last 2 hours, severe in intensity, and associated with cough but not associated with chest pain or fever.  The patient was recently diagnosed with pneumonia and took a course of antibiotics, but had been doing well until tonight.   Past Medical History:  Diagnosis Date  . Anxiety   . BPH (benign prostatic hyperplasia)    a. complicated by urinary retention req chronic foley.  . Cellulitis    a. 10/2016 - Right leg.  . Chronic systolic CHF (congestive heart failure) (HCC)    a. 02/2016 Echo: EF 40-45%, no rwma, mildly dil LA.  Marland Kitchen. Chronic Urinary retention    a. Chronic indwelling foley.  Marland Kitchen. COPD (chronic obstructive pulmonary disease) (HCC)    a. On chronic supplemental O2 via [place].  Marland Kitchen. Coronary artery disease    a. 03/2011 Inflat STEMI/PCI ([facility]): LM nl, LAD 50p, LCX 482m, RI nl, RCA 100p (2.75x22 MDT Integrity BMS)-->post procedure course complicated by RFV DVT req coumadin and then R SFA PSA & hematoma req surgical evacuation.  . DVT (deep venous thrombosis) (HCC)    a. 03/2011 R Femoral Vein DVT following Cath/PCI.  Marland Kitchen. Hematuria   . Hemorrhoids   . History of GI diverticular bleed    a. 02/2017 - rectal bleeding felt to be diverticular-->colonoscopy deferrred.  . Hypertensive heart disease   . Ischemic cardiomyopathy    a. 02/2016 Echo: EF 40-45%.  . Mixed hyperlipidemia   . PTSD (post-traumatic stress disorder)    with headaches  . Right SFA Pseudoaneurysm (HCC)    a. 03/2011 following Cath/PCI-->complicated by hematoma req  surgical evacuation.    Patient Active Problem List   Diagnosis Date Noted  . CAP (community acquired pneumonia) 04/02/2017  . Elevated troponin 03/21/2017  . Hematuria 03/21/2017  . Demand ischemia (HCC)   . GIB (gastrointestinal bleeding) 03/04/2017  . Shortness of breath 01/20/2017  . CHF (congestive heart failure) (HCC) 01/02/2017  . Acute on chronic respiratory failure (HCC) 11/11/2016  . Open wound of lower limb with tendon involvement, right, subsequent encounter   . Cellulitis of right leg 10/18/2016  . DNR (do not resuscitate) discussion   . Palliative care by specialist   . Adult failure to thrive   . Gross hematuria   . Acute respiratory failure (HCC) 02/18/2016  . Urinary retention 11/10/2015  . Hyperkalemia 11/02/2015  . Hypotension 11/02/2015  . Dysphagia, pharyngoesophageal phase 11/02/2015  . Esophageal spasm 11/02/2015  . Constipation 11/02/2015  . SIADH (syndrome of inappropriate ADH production) (HCC) 11/02/2015  . Urinary retention due to benign prostatic hyperplasia 11/02/2015  . Chronic indwelling Foley catheter 11/02/2015  . Urinary tract infection with hematuria 11/02/2015  . Protein-calorie malnutrition, severe 10/30/2015  . ARF (acute renal failure) (HCC) 10/29/2015  . Dyspnea 10/07/2015  . Hyponatremia 06/06/2015  . Coronary artery disease   . Hypertensive heart disease   . Mixed hyperlipidemia   . COPD (chronic obstructive pulmonary disease) (HCC)   . COPD exacerbation (HCC) 01/11/2015  . Moderate COPD (chronic obstructive pulmonary disease) (HCC) 12/08/2014  . Fall  09/03/2014  . Hip fracture (HCC) 09/03/2014  . PTSD (post-traumatic stress disorder) 09/03/2014  . Bilateral leg edema 09/03/2014  . SOB (shortness of breath) 10/31/2012  . Essential hypertension 10/31/2012  . CAD (coronary artery disease) 10/31/2012  . Hyperlipidemia 10/31/2012  . Tachycardia 10/31/2012    Past Surgical History:  Procedure Laterality Date  . CATARACT  EXTRACTION    . CHOLECYSTECTOMY    . CORONARY ANGIOPLASTY     with stent; [facility]  . HAND SURGERY    . HEMORRHOID SURGERY    . HIP SURGERY    . VASCULAR SURGERY     stent R femoral artery    Prior to Admission medications   Medication Sig Start Date End Date Taking? Authorizing Provider  acetaminophen (TYLENOL) 500 MG tablet Take 500 mg by mouth daily as needed for mild pain or moderate pain.    Yes [provider]  albuterol (PROAIR HFA) 108 (90 BASE) MCG/ACT inhaler Inhale 2 puffs into the lungs every 6 (six) hours as needed. For wheezing.   Yes [provider]  albuterol (PROVENTIL) (2.5 MG/3ML) 0.083% nebulizer solution Take 3 mLs (2.5 mg total) by nebulization every 4 (four) hours as needed for wheezing or shortness of breath. 09/28/16  Yes Milagros Loll, MD  amoxicillin-clavulanate (AUGMENTIN) 875-125 MG tablet Take 1 tablet by mouth 2 (two) times daily for 14 days. 04/05/17 04/19/17 Yes Katha Hamming, MD  aspirin (ASPIRIN CHILDRENS) 81 MG chewable tablet Chew 1 tablet (81 mg total) by mouth daily. 03/23/17 03/23/18 Yes Katha Hamming, MD  azithromycin (ZITHROMAX) 250 MG tablet Daily for 3 days 04/05/17  Yes Katha Hamming, MD  budesonide (PULMICORT) 0.25 MG/2ML nebulizer solution Take 2 mLs (0.25 mg total) by nebulization 2 (two) times daily. 04/05/17  Yes Katha Hamming, MD  furosemide (LASIX) 40 MG tablet Take 0.5 tablets (20 mg total) by mouth daily. 01/04/17  Yes Sainani, Rolly Pancake, MD  ipratropium-albuterol (DUONEB) 0.5-2.5 (3) MG/3ML SOLN Take 3 mLs by nebulization every 4 (four) hours. 03/23/17  Yes Katha Hamming, MD  magnesium hydroxide (MILK OF MAGNESIA) 400 MG/5ML suspension Take 30 mLs by mouth 2 (two) times daily as needed for mild constipation or moderate constipation. 11/02/15  Yes Katharina Caper, MD  Multiple Vitamin (MULTIVITAMIN WITH MINERALS) TABS tablet Take 1 tablet by mouth daily. 03/24/17  Yes Katha Hamming, MD    nitroGLYCERIN (NITROSTAT) 0.4 MG SL tablet Place 0.4 mg under the tongue every 5 (five) minutes as needed for chest pain. Reported on 10/19/2015   Yes [provider]  oxybutynin (DITROPAN-XL) 5 MG 24 hr tablet Take 1 tablet (5 mg total) by mouth at bedtime. 03/23/17  Yes Katha Hamming, MD  polyethylene glycol (MIRALAX / GLYCOLAX) packet Take 17 g by mouth daily. 03/05/17  Yes Mody, Patricia Pesa, MD  predniSONE (STERAPRED UNI-PAK 21 TAB) 10 MG (21) TBPK tablet Taper by 10 mg daily 04/05/17  Yes Katha Hamming, MD  psyllium (HYDROCIL/METAMUCIL) 95 % PACK Take 1 packet by mouth daily. 03/05/17  Yes Adrian Saran, MD  tiotropium (SPIRIVA) 18 MCG inhalation capsule Place 1 capsule (18 mcg total) into inhaler and inhale every evening. 10/09/15 01/14/18 Yes Mody, Patricia Pesa, MD  feeding supplement (BOOST / RESOURCE BREEZE) LIQD Take 1 Container by mouth 3 (three) times daily between meals. 11/02/15   Katharina Caper, MD  hydrocortisone (ANUSOL-HC) 2.5 % rectal cream Apply topically 2 (two) times daily. 03/23/17   Katha Hamming, MD    Allergies Iodine (kelp) [iodine]  Family History  Problem Relation  Age of Onset  . COPD Father   . Bladder Cancer Neg Hx   . Kidney cancer Neg Hx   . Prostate cancer Neg Hx     Social History Social History   Tobacco Use  . Smoking status: Former Smoker    Packs/day: 3.00    Years: 40.00    Pack years: 120.00    Types: Cigarettes  . Smokeless tobacco: Never Used  . Tobacco comment: quit 62 years  Substance Use Topics  . Alcohol use: Yes    Alcohol/week: 1.8 oz    Types: 3 Glasses of wine per week    Comment: glass of wine at night  . Drug use: No    Review of Systems  Constitutional: No fever. Eyes: No redness. ENT: Positive for sore throat. Cardiovascular: Denies chest pain. Respiratory: Positive for shortness of breath. Gastrointestinal: No nausea, no vomiting.   Genitourinary: Positive for for dysuria.  Musculoskeletal: Negative  for back pain. Skin: Negative for rash. Neurological: Negative for headache.   ____________________________________________   PHYSICAL EXAM:  VITAL SIGNS: ED Triage Vitals  Enc Vitals Group     BP 2017/05/06 0507 (!) 142/96     Pulse Rate May 06, 2017 0507 (!) 113     Resp May 06, 2017 0507 (!) 21     Temp May 06, 2017 0507 (!) 97.4 F (36.3 C)     Temp Source 05/06/2017 0507 Axillary     SpO2 05-06-17 0506 100 %     Weight 05/06/2017 0510 125 lb (56.7 kg)     Height 05/06/17 0510 5\' 7"  (1.702 m)     Head Circumference --      Peak Flow --      Pain Score 05/06/17 0506 0     Pain Loc --      Pain Edu? --      Excl. in GC? --     Constitutional: Alert and oriented.  Moderate respiratory distress but speaking short sentences. Eyes: Conjunctivae are normal.  EOMI.   Head: Atraumatic. Nose: No congestion/rhinnorhea. Mouth/Throat: Mucous membranes are dry.   Neck: Normal range of motion.  Cardiovascular: Tachycardic, regular rhythm. Grossly normal heart sounds.  Good peripheral circulation. Respiratory: Increased respiratory effort with slight distress.  Decreased breath sounds bilaterally.  No wheezes. Gastrointestinal: Soft and nontender. No distention.  Genitourinary: No flank tenderness. Musculoskeletal: No lower extremity edema.  Extremities warm and well perfused.  Neurologic:  Normal speech and language. No gross focal neurologic deficits are appreciated.  Skin:  Skin is warm and dry. No rash noted. Psychiatric: Mood and affect are normal. Speech and behavior are normal.  ____________________________________________   LABS (all labs ordered are listed, but only abnormal results are displayed)  Labs Reviewed  URINALYSIS, COMPLETE (UACMP) WITH MICROSCOPIC - Abnormal; Notable for the following components:      Result Value   Color, Urine YELLOW (*)    APPearance CLEAR (*)    Hgb urine dipstick MODERATE (*)    Protein, ur 100 (*)    Bacteria, UA RARE (*)    All other components  within normal limits  TROPONIN I - Abnormal; Notable for the following components:   Troponin I 0.30 (*)    All other components within normal limits  BRAIN NATRIURETIC PEPTIDE - Abnormal; Notable for the following components:   B Natriuretic Peptide 363.0 (*)    All other components within normal limits  COMPREHENSIVE METABOLIC PANEL - Abnormal; Notable for the following components:   Chloride 100 (*)  Glucose, Bld 132 (*)    BUN 53 (*)    Total Protein 6.4 (*)    Albumin 3.1 (*)    AST 43 (*)    Total Bilirubin 1.6 (*)    GFR calc non Af Amer 53 (*)    All other components within normal limits  CBC WITH DIFFERENTIAL/PLATELET - Abnormal; Notable for the following components:   WBC 19.9 (*)    RBC 3.66 (*)    Hemoglobin 11.8 (*)    HCT 36.0 (*)    RDW 16.4 (*)    Neutro Abs 18.1 (*)    Lymphs Abs 0.8 (*)    All other components within normal limits  CULTURE, BLOOD (ROUTINE X 2)  CULTURE, BLOOD (ROUTINE X 2)  INFLUENZA PANEL BY PCR (TYPE A & B)  LACTIC ACID, PLASMA  LACTIC ACID, PLASMA   ____________________________________________  EKG  ED ECG REPORT I, Dionne BucySebastian Kayon Dozier, the attending physician, personally viewed and interpreted this ECG.  Date: 03/31/2017 EKG Time: 500 Rate: 116 Rhythm: Sinus tachycardia QRS Axis: normal Intervals: Right bundle branch block ST/T Wave abnormalities: Nonspecific findings in the inferior leads Narrative Interpretation: no evidence of acute ischemia; no significant change when compared to EKG of 03/24/2017  ____________________________________________  RADIOLOGY  CXR: Mild bibasilar opacities, small bilateral pleural effusions  ____________________________________________   PROCEDURES  Procedure(s) performed: No    Critical Care performed: Yes  CRITICAL CARE Performed by: Dionne BucySebastian Turner Baillie   Total critical care time: 40 minutes  Critical care time was exclusive of separately billable procedures and treating  other patients.  Critical care was necessary to treat or prevent imminent or life-threatening deterioration.  Critical care was time spent personally by me on the following activities: development of treatment plan with patient and/or surrogate as well as nursing, discussions with consultants, evaluation of patient's response to treatment, examination of patient, obtaining history from patient or surrogate, ordering and performing treatments and interventions, ordering and review of laboratory studies, ordering and review of radiographic studies, pulse oximetry and re-evaluation of patient's condition. ____________________________________________   INITIAL IMPRESSION / ASSESSMENT AND PLAN / ED COURSE  Pertinent labs & imaging results that were available during my care of the patient were reviewed by me and considered in my medical decision making (see chart for details).  83 year old male with past medical history as noted above presents with acute onset of shortness of breath and last 2 hours, with acute respiratory distress and hypoxia requiring CPAP by EMS.  Patient placed on BiPAP here and is tolerating it well.  On review of past medical records in Epic, the patient was most recently seen in the ED on 12/23 and admitted for community-acquired pneumonia and COPD.  There is no available discharge summary, but it appears that he was discharged on 12/26 or 12/27.  He was also admitted for rectal bleeding and other unrelated symptoms last month.  On exam, the patient is in slight respiratory distress but is able to speak in short sentences while on the BiPAP.  He has decreased breath sounds bilaterally with no other significant exam findings.  Differential for this presentation is new or worsening pneumonia, influenza or other viral syndrome, COPD exacerbation, CHF exacerbation or some combination of the above.  Plan: Continue BiPAP, chest x-ray, lab workup including cardiac enzymes and BNP, and  reassess.  Anticipate admission.     ----------------------------------------- 6:39 AM on 03/23/2017 -----------------------------------------  Patient's respiratory status has significantly improved, so we will attempt to wean him off  the BiPAP.  His lab workup reveals findings consistent with a UTI, but the metabolic panel is unremarkable.  His troponin is elevated which is consistent with strain; I do not suspect an acute cardiac event as the etiology of his presentation.  Given that patient was already admitted for community-acquired pneumonia, as well as the combination of UTI and possible bilateral pulmonary findings I will cover with broad-spectrum antibiotics and admit.  ----------------------------------------- 6:44 AM on 04-11-17 -----------------------------------------  Patient signed out to the hospitalist Dr. Sheryle Hail.  ____________________________________________   FINAL CLINICAL IMPRESSION(S) / ED DIAGNOSES  Final diagnoses:  Urinary tract infection without hematuria, site unspecified  Respiratory distress  Hypoxia      NEW MEDICATIONS STARTED DURING THIS VISIT:  This SmartLink is deprecated. Use AVSMEDLIST instead to display the medication list for a patient.   Note:  This document was prepared using Dragon voice recognition software and may include unintentional dictation errors.    Dionne Bucy, MD 2017/04/11 [phone]

## 2017-04-08 NOTE — ED Notes (Signed)
Date and time results received: January 10, 2017 0724 (use smartphrase ".now" to insert current time)  Test: Lactic Acid Critical Value: 2.4  Name of Provider Notified: Dr. Roxan Hockeyobinson  Orders Received? Or Actions Taken?: Notified ED Provider.

## 2017-04-08 NOTE — Progress Notes (Addendum)
Dr. Welton FlakesKhan paged for increase in troponin.

## 2017-04-08 NOTE — Progress Notes (Signed)
Admitted morning because of short of breath, initially required BiPAP but later on shortness of breath improved with  Solu-Medrol, bronchodilators, admitted to telemetry.  Sepsis secondary to pneumonia, COPD exacerbation.  Patient admitted 8 times in the last 6 months.  And while patient's daughter also tells that he will not listen to her.  Patient usually sleeps in recliner and now obviously not able to care for himself.  Just discharged 2 days ago by me.  Physical examination: Patient is very sleepy and catching up sleep from last night, vitals reviewed, Blood pressure 151/97, heart rate 110, saturation 99% on room air. Cardiovascular system S1, S2 regular. Lungs clear to auscultation except for faint wheezing in the right upper lobe. Abdomen soft, nontender, nondistended.  Patient appears confused. Assessment and plan: #1 acute chronic respiratory failure secondary to healthcare associated pneumonia: Continue vancomycin cefepime, azithromycin, lactic acid is coming down. 2.  Sepsis present on admission secondary to pneumonia: Continue antibiotics, follow blood cultures, lactic acid is coming down, continue IV fluids. 3.  Elevated troponins likely demand ischemia from respiratory failure and sepsis.  Cardiology is consulted for elevated troponins.  Ischemic cardiomyopathy, EF 40-45% by echo in 2017.  Had history of CAD.  Continue aspirin, add small dose beta-blockers.  Will continue nitrates.  Not a candidate for full dose anticoagulation secondary to advanced age and multiple comorbidities. code'DNR Time spent 30 minutes.

## 2017-04-08 NOTE — ED Triage Notes (Addendum)
Arrives via EMS w/ c/o SOB.  EMS states they were called out to pt's house for 'breathing problems.' SaO2 88% on RA at arrival.  Dx w/ PNU 03/31/17 and has been on abx. Chronic 2L O2 at home.   Pt placed on BiPap and Albuterol med neb and 125mg  solu medrol IV given en route.

## 2017-04-08 NOTE — Progress Notes (Signed)
ANTIBIOTIC CONSULT NOTE - INITIAL  Pharmacy Consult for cefepime and vancomycin Indication: HCAP  Allergies  Allergen Reactions  . Iodine (Kelp) [Iodine] Nausea And Vomiting    Patient Measurements: Height: 5\' 7"  (170.2 cm) Weight: 125 lb (56.7 kg) IBW/kg (Calculated) : 66.1 Adjusted Body Weight:   Vital Signs: Temp: 97 F (36.1 C) (12/29 0906) Temp Source: Axillary (12/29 0906) BP: 151/97 (12/29 1017) Pulse Rate: 112 (12/29 1017) Intake/Output from previous day: No intake/output data recorded. Intake/Output from this shift: Total I/O In: -  Out: 2400 [Urine:2400]  Labs: Recent Labs    06/08/2016 0524  WBC 19.9*  HGB 11.8*  PLT 191  CREATININE 1.08   Estimated Creatinine Clearance: 27.7 mL/min (by C-G formula based on SCr of 1.08 mg/dL). No results for input(s): VANCOTROUGH, VANCOPEAK, VANCORANDOM, GENTTROUGH, GENTPEAK, GENTRANDOM, TOBRATROUGH, TOBRAPEAK, TOBRARND, AMIKACINPEAK, AMIKACINTROU, AMIKACIN in the last 72 hours.   Microbiology: Recent Results (from the past 720 hour(s))  Urine culture     Status: Abnormal   Collection Time: 03/21/17 12:12 AM  Result Value Ref Range Status   Specimen Description URINE, RANDOM  Final   Special Requests NONE  Final   Culture (A)  Final    >=100,000 COLONIES/mL SERRATIA MARCESCENS 40,000 COLONIES/mL ENTEROCOCCUS FAECALIS    Report Status 03/24/2017 FINAL  Final   Organism ID, Bacteria SERRATIA MARCESCENS (A)  Final   Organism ID, Bacteria ENTEROCOCCUS FAECALIS (A)  Final      Susceptibility   Enterococcus faecalis - MIC*    AMPICILLIN <=2 SENSITIVE Sensitive     LEVOFLOXACIN >=8 RESISTANT Resistant     NITROFURANTOIN <=16 SENSITIVE Sensitive     VANCOMYCIN 1 SENSITIVE Sensitive     * 40,000 COLONIES/mL ENTEROCOCCUS FAECALIS   Serratia marcescens - MIC*    CEFAZOLIN >=64 RESISTANT Resistant     CEFTRIAXONE <=1 SENSITIVE Sensitive     CIPROFLOXACIN <=0.25 SENSITIVE Sensitive     GENTAMICIN <=1 SENSITIVE Sensitive      NITROFURANTOIN 128 RESISTANT Resistant     TRIMETH/SULFA <=20 SENSITIVE Sensitive     * >=100,000 COLONIES/mL SERRATIA MARCESCENS  Blood culture (routine x 2)     Status: None   Collection Time: 03/21/17  1:05 AM  Result Value Ref Range Status   Specimen Description BLOOD RIGHT AC  Final   Special Requests   Final    BOTTLES DRAWN AEROBIC AND ANAEROBIC Blood Culture adequate volume   Culture NO GROWTH 5 DAYS  Final   Report Status 03/26/2017 FINAL  Final  Blood culture (routine x 2)     Status: None   Collection Time: 03/21/17  1:05 AM  Result Value Ref Range Status   Specimen Description BLOOD LEFT ARM  Final   Special Requests   Final    BOTTLES DRAWN AEROBIC AND ANAEROBIC Blood Culture adequate volume   Culture NO GROWTH 5 DAYS  Final   Report Status 03/26/2017 FINAL  Final  Culture, blood (routine x 2) Call MD if unable to obtain prior to antibiotics being given     Status: None   Collection Time: 04/02/17  8:33 PM  Result Value Ref Range Status   Specimen Description BLOOD RIGHT ANTECUBITAL  Final   Special Requests   Final    BOTTLES DRAWN AEROBIC AND ANAEROBIC Blood Culture adequate volume   Culture   Final    NO GROWTH 5 DAYS Performed at [facility], [address] [place], [place] [address]    Report Status 04/07/2017 FINAL  Final  Culture, blood (routine x 2) Call MD if unable to obtain prior to antibiotics being given     Status: None   Collection Time: 04/02/17  8:33 PM  Result Value Ref Range Status   Specimen Description BLOOD LEFT ANTECUBITAL  Final   Special Requests   Final    BOTTLES DRAWN AEROBIC AND ANAEROBIC Blood Culture adequate volume   Culture   Final    NO GROWTH 5 DAYS Performed at [facility], [address] [place], [place] [address]    Report Status 04/07/2017 FINAL  Final  Culture, blood (routine x 2)     Status: None (Preliminary result)   Collection Time: 2016-12-29  5:28 AM  Result Value Ref Range Status    Specimen Description BLOOD LEFT ANTECUBITAL  Final   Special Requests   Final    BOTTLES DRAWN AEROBIC AND ANAEROBIC Blood Culture adequate volume   Culture   Final    NO GROWTH < 12 HOURS Performed at [facility], [address] [place], [place] [address]    Report Status PENDING  Incomplete  Culture, blood (routine x 2)     Status: None (Preliminary result)   Collection Time: 2016-12-29  5:35 AM  Result Value Ref Range Status   Specimen Description BLOOD RIGHT ARM  Final   Special Requests   Final    BOTTLES DRAWN AEROBIC AND ANAEROBIC Blood Culture adequate volume   Culture   Final    NO GROWTH < 12 HOURS Performed at [facility], [address] [place], [place] [address]    Report Status PENDING  Incomplete    Medical History: Past Medical History:  Diagnosis Date  . Anxiety   . BPH (benign prostatic hyperplasia)    a. complicated by urinary retention req chronic foley.  . Cellulitis    a. 10/2016 - Right leg.  . Chronic systolic CHF (congestive heart failure) (HCC)    a. 02/2016 Echo: EF 40-45%, no rwma, mildly dil LA.  Marland Kitchen. Chronic Urinary retention    a. Chronic indwelling foley.  Marland Kitchen. COPD (chronic obstructive pulmonary disease) (HCC)    a. On chronic supplemental O2 via [place].  Marland Kitchen. Coronary artery disease    a. 03/2011 Inflat STEMI/PCI ([facility]): LM nl, LAD 50p, LCX 8979m, RI nl, RCA 100p (2.75x22 MDT Integrity BMS)-->post procedure course complicated by RFV DVT req coumadin and then R SFA PSA & hematoma req surgical evacuation.  . DVT (deep venous thrombosis) (HCC)    a. 03/2011 R Femoral Vein DVT following Cath/PCI.  Marland Kitchen. Hematuria   . Hemorrhoids   . History of GI diverticular bleed    a. 02/2017 - rectal bleeding felt to be diverticular-->colonoscopy deferrred.  . Hypertensive heart disease   . Ischemic cardiomyopathy    a. 02/2016 Echo: EF 40-45%.  . Mixed hyperlipidemia   . PTSD (post-traumatic stress disorder)    with headaches  . Right SFA  Pseudoaneurysm (HCC)    a. 03/2011 following Cath/PCI-->complicated by hematoma req surgical evacuation.    Medications:  Infusions:  . ceFEPime (MAXIPIME) IV    . ceFEPIme (MAXIPIME) 1 GM IVPB    . [START ON 04/09/2017] vancomycin     Assessment: 89 yom cc SOB. Recently discharged from hospital where he was treated for pneumonia. Meets sepsis criteria in ED. Pharmacy consulted to dose cefepime and vancomycin  Goal of Therapy:  Vancomycin trough level 15-20 mcg/ml  Plan:  1. Cefepime 1 gm IV Q24H 2. Vancomycin  1 gm IV x 1 in ED followed in approximately 20 hours (stacked dosing) by vancomycin 1 gm IV Q36H, predicted trough 15 mcg/mL. Pharmacy will continue to follow and adjust as needed to maintain trough 15 to 20 mcg/mL.   Vd 39.7 L, Ke 0.027 hr-1, T1/2 25.3 hr  Carola Frost, Pharm.D., BCPS Clinical Pharmacist 28-Apr-2017,10:37 AM

## 2017-04-08 NOTE — H&P (Signed)
Daniel Dickson is an 83 y.o. male.   Chief Complaint: Shortness of breath HPI: The patient with past medical history of coronary artery disease status post PCI, COPD, ischemic cardiomyopathy and hypertension presents emergency department with shortness of breath.  The patient was just discharged from the hospital 3 days ago after treatment of community-acquired pneumonia.  The patient had significant respiratory distress was placed on BiPAP in route to the emergency department.  He also received Solu-Medrol as well as 2 breathing treatments.  The patient was able to be weaned from BiPAP to nasal cannula.  Due to his comorbidities as well as increased work of breathing the emergency department staff called the hospitalist service for admission.  Past Medical History:  Diagnosis Date  . Anxiety   . BPH (benign prostatic hyperplasia)    a. complicated by urinary retention req chronic foley.  . Cellulitis    a. 10/2016 - Right leg.  . Chronic systolic CHF (congestive heart failure) ([place])    a. 02/2016 Echo: EF 40-45%, no rwma, mildly dil LA.  Marland Kitchen Chronic Urinary retention    a. Chronic indwelling foley.  Marland Kitchen COPD (chronic obstructive pulmonary disease) ([place])    a. On chronic supplemental O2 via [place].  Marland Kitchen Coronary artery disease    a. 03/2011 Inflat STEMI/PCI ([facility]): LM nl, LAD 50p, LCX 16m RI nl, RCA 100p (2.75x22 MDT Integrity BMS)-->post procedure course complicated by RFV DVT req coumadin and then R SFA PSA & hematoma req surgical evacuation.  . DVT (deep venous thrombosis) ([place]    a. 03/2011 R Femoral Vein DVT following Cath/PCI.  .Marland KitchenHematuria   . Hemorrhoids   . History of GI diverticular bleed    a. 02/2017 - rectal bleeding felt to be diverticular-->colonoscopy deferrred.  . Hypertensive heart disease   . Ischemic cardiomyopathy    a. 02/2016 Echo: EF 40-45%.  . Mixed hyperlipidemia   . PTSD (post-traumatic stress disorder)    with headaches  . Right SFA [place]urysm ([place]    a.  03/2011 following Cath/PCI-->complicated by hematoma req surgical evacuation.    Past Surgical History:  Procedure Laterality Date  . CATARACT EXTRACTION    . CHOLECYSTECTOMY    . CORONARY ANGIOPLASTY     with stent; [facility]  . HAND SURGERY    . HEMORRHOID SURGERY    . HIP SURGERY    . VASCULAR SURGERY     stent R femoral artery    Family History  Problem Relation Age of Onset  . COPD Father   . Bladder Cancer Neg Hx   . Kidney cancer Neg Hx   . Prostate cancer Neg Hx    Social History:  reports that he has quit smoking. His smoking use included cigarettes. He has a 120.00 pack-year smoking history. he has never used smokeless tobacco. He reports that he drinks about 1.8 oz of alcohol per week. He reports that he does not use drugs.  Allergies:  Allergies  Allergen Reactions  . Iodine (Kelp) [Iodine] Nausea And Vomiting    Prior to Admission medications   Medication Sig Start Date End Date Taking? Authorizing Provider  acetaminophen (TYLENOL) 500 MG tablet Take 500 mg by mouth daily as needed for mild pain or moderate pain.    Yes [provider]  albuterol (PROAIR HFA) 108 (90 BASE) MCG/ACT inhaler Inhale 2 puffs into the lungs every 6 (six) hours as needed. For wheezing.   Yes [provider]  albuterol (PROVENTIL) (2.5 MG/3ML) 0.083% nebulizer  solution Take 3 mLs (2.5 mg total) by nebulization every 4 (four) hours as needed for wheezing or shortness of breath. 09/28/16  Yes Hillary Bow, MD  amoxicillin-clavulanate (AUGMENTIN) 875-125 MG tablet Take 1 tablet by mouth 2 (two) times daily for 14 days. 04/05/17 04/19/17 Yes Epifanio Lesches, MD  aspirin (ASPIRIN CHILDRENS) 81 MG chewable tablet Chew 1 tablet (81 mg total) by mouth daily. 03/23/17 03/23/18 Yes Epifanio Lesches, MD  azithromycin (ZITHROMAX) 250 MG tablet Daily for 3 days 04/05/17  Yes Epifanio Lesches, MD  budesonide (PULMICORT) 0.25 MG/2ML nebulizer solution Take 2 mLs (0.25 mg total)  by nebulization 2 (two) times daily. 04/05/17  Yes Epifanio Lesches, MD  furosemide (LASIX) 40 MG tablet Take 0.5 tablets (20 mg total) by mouth daily. 01/04/17  Yes Sainani, Belia Heman, MD  ipratropium-albuterol (DUONEB) 0.5-2.5 (3) MG/3ML SOLN Take 3 mLs by nebulization every 4 (four) hours. 03/23/17  Yes Epifanio Lesches, MD  magnesium hydroxide (MILK OF MAGNESIA) 400 MG/5ML suspension Take 30 mLs by mouth 2 (two) times daily as needed for mild constipation or moderate constipation. 11/02/15  Yes Theodoro Grist, MD  Multiple Vitamin (MULTIVITAMIN WITH MINERALS) TABS tablet Take 1 tablet by mouth daily. 03/24/17  Yes Epifanio Lesches, MD  nitroGLYCERIN (NITROSTAT) 0.4 MG SL tablet Place 0.4 mg under the tongue every 5 (five) minutes as needed for chest pain. Reported on 10/19/2015   Yes [provider]  oxybutynin (DITROPAN-XL) 5 MG 24 hr tablet Take 1 tablet (5 mg total) by mouth at bedtime. 03/23/17  Yes Epifanio Lesches, MD  polyethylene glycol (MIRALAX / GLYCOLAX) packet Take 17 g by mouth daily. 03/05/17  Yes Mody, Ulice Bold, MD  predniSONE (STERAPRED UNI-PAK 21 TAB) 10 MG (21) TBPK tablet Taper by 10 mg daily 04/05/17  Yes Epifanio Lesches, MD  psyllium (HYDROCIL/METAMUCIL) 95 % PACK Take 1 packet by mouth daily. 03/05/17  Yes Bettey Costa, MD  tiotropium (SPIRIVA) 18 MCG inhalation capsule Place 1 capsule (18 mcg total) into inhaler and inhale every evening. 10/09/15 01/14/18 Yes Mody, Ulice Bold, MD  feeding supplement (BOOST / RESOURCE BREEZE) LIQD Take 1 Container by mouth 3 (three) times daily between meals. 11/02/15   Theodoro Grist, MD  hydrocortisone (ANUSOL-HC) 2.5 % rectal cream Apply topically 2 (two) times daily. 03/23/17   Epifanio Lesches, MD     Results for orders placed or performed during the hospital encounter of 04/03/2017 (from the past 48 hour(s))  Urinalysis, Complete w Microscopic     Status: Abnormal   Collection Time: 03/13/2017  5:24 AM  Result Value Ref  Range   Color, Urine YELLOW (A) YELLOW   APPearance CLEAR (A) CLEAR   Specific Gravity, Urine 1.020 1.005 - 1.030   pH 6.0 5.0 - 8.0   Glucose, UA NEGATIVE NEGATIVE mg/dL   Hgb urine dipstick MODERATE (A) NEGATIVE   Bilirubin Urine NEGATIVE NEGATIVE   Ketones, ur NEGATIVE NEGATIVE mg/dL   Protein, ur 100 (A) NEGATIVE mg/dL   Nitrite NEGATIVE NEGATIVE   Leukocytes, UA NEGATIVE NEGATIVE   RBC / HPF TOO NUMEROUS TO COUNT 0 - 5 RBC/hpf   WBC, UA 6-30 0 - 5 WBC/hpf   Bacteria, UA RARE (A) NONE SEEN   Squamous Epithelial / LPF NONE SEEN NONE SEEN   Mucus PRESENT    Budding Yeast PRESENT     Comment: Performed at [facility], [address] [place], [place] [address]  Troponin I     Status: Abnormal   Collection Time: 04/10/2017  5:24  AM  Result Value Ref Range   Troponin I 0.30 (HH) <0.03 ng/mL    Comment: CRITICAL RESULT CALLED TO, READ BACK BY AND VERIFIED WITH ALECIA RAWLINS AT 1740 03/16/2017.PMH Performed at [facility], [address] [place], [place] [address]   Brain natriuretic peptide     Status: Abnormal   Collection Time: 03/14/2017  5:24 AM  Result Value Ref Range   B Natriuretic Peptide 363.0 (H) 0.0 - 100.0 pg/mL    Comment: Performed at [facility], [address] [place], [place] [address]  Comprehensive metabolic panel     Status: Abnormal   Collection Time: 03/24/2017  5:24 AM  Result Value Ref Range   Sodium 138 135 - 145 mmol/L   Potassium 4.3 3.5 - 5.1 mmol/L   Chloride 100 (L) 101 - 111 mmol/L   CO2 31 22 - 32 mmol/L   Glucose, Bld 132 (H) 65 - 99 mg/dL   BUN 53 (H) 6 - 20 mg/dL   Creatinine, Ser 1.08 0.61 - 1.24 mg/dL   Calcium 9.8 8.9 - 10.3 mg/dL   Total Protein 6.4 (L) 6.5 - 8.1 g/dL   Albumin 3.1 (L) 3.5 - 5.0 g/dL   AST 43 (H) 15 - 41 U/L   ALT 46 17 - 63 U/L   Alkaline Phosphatase 51 38 - 126 U/L   Total Bilirubin 1.6 (H) 0.3 - 1.2 mg/dL   GFR calc non Af Amer 53 (L) >60 mL/min   GFR calc Af Amer >60 >60 mL/min     Comment: (NOTE) The eGFR has been calculated using the CKD EPI equation. This calculation has not been validated in all clinical situations. eGFR's persistently <60 mL/min signify possible Chronic Kidney Disease.    Anion gap 7 5 - 15    Comment: Performed at [facility], [address] [place], [place] [address]  CBC with Differential     Status: Abnormal   Collection Time: 03/13/2017  5:24 AM  Result Value Ref Range   WBC 19.9 (H) 3.8 - 10.6 K/uL   RBC 3.66 (L) 4.40 - 5.90 MIL/uL   Hemoglobin 11.8 (L) 13.0 - 18.0 g/dL   HCT 36.0 (L) 40.0 - 52.0 %   MCV 98.3 80.0 - 100.0 fL   MCH 32.2 26.0 - 34.0 pg   MCHC 32.8 32.0 - 36.0 g/dL   RDW 16.4 (H) 11.5 - 14.5 %   Platelets 191 150 - 440 K/uL   Neutrophils Relative % 91 %   Neutro Abs 18.1 (H) 1.4 - 6.5 K/uL   Lymphocytes Relative 4 %   Lymphs Abs 0.8 (L) 1.0 - 3.6 K/uL   Monocytes Relative 5 %   Monocytes Absolute 0.9 0.2 - 1.0 K/uL   Eosinophils Relative 0 %   Eosinophils Absolute 0.0 0 - 0.7 K/uL   Basophils Relative 0 %   Basophils Absolute 0.1 0 - 0.1 K/uL    Comment: Performed at [facility], [address] [place], [place] [address]  Influenza panel by PCR (type A & B)     Status: None   Collection Time: 04/01/2017  5:44 AM  Result Value Ref Range   Influenza A By PCR NEGATIVE NEGATIVE   Influenza B By PCR NEGATIVE NEGATIVE    Comment: (NOTE) The Xpert Xpress Flu assay is intended as an aid in the diagnosis of  influenza and should not be used as a sole basis for treatment.  This  assay is FDA approved  for nasopharyngeal swab specimens only. Nasal  washings and aspirates are unacceptable for Xpert Xpress Flu testing. Performed at [facility], [address], [place], [place] [address]    Dg Chest Portable 1 View  Result Date: 04/03/2017 CLINICAL DATA:  Acute onset of shortness of breath. EXAM: PORTABLE CHEST 1 VIEW COMPARISON:  Chest radiograph performed 04/02/2017 FINDINGS: The lungs  are hyperexpanded, with underlying emphysema. Small bilateral pleural effusions are noted. Mild bibasilar opacities may reflect interstitial edema. There is no evidence of pneumothorax. The cardiomediastinal silhouette is within normal limits. No acute osseous abnormalities are seen. IMPRESSION: 1. Small bilateral pleural effusions. Mild bibasilar opacities may reflect interstitial edema. 2. Lungs hyperexpanded, with underlying emphysema. Electronically Signed   By: Garald Balding M.D.   On: 03/26/2017 05:44    Review of Systems  Constitutional: Negative for chills and fever.  HENT: Negative for sore throat and tinnitus.   Eyes: Negative for blurred vision and redness.  Respiratory: Positive for cough and shortness of breath.   Cardiovascular: Negative for chest pain, palpitations, orthopnea and PND.  Gastrointestinal: Negative for abdominal pain, diarrhea, nausea and vomiting.  Genitourinary: Negative for dysuria, frequency and urgency.  Musculoskeletal: Negative for joint pain and myalgias.  Skin: Negative for rash.       No lesions  Neurological: Negative for speech change, focal weakness and weakness.  Endo/Heme/Allergies: Does not bruise/bleed easily.       No temperature intolerance  Psychiatric/Behavioral: Negative for depression and suicidal ideas.    Blood pressure (!) 144/91, pulse (!) 108, temperature (!) 97.4 F (36.3 C), temperature source Axillary, resp. rate (!) 26, height 5' 7"  (1.702 m), weight 56.7 kg (125 lb), SpO2 98 %. Physical Exam  Vitals reviewed. Constitutional: He is oriented to person, place, and time. He appears well-developed and well-nourished. No distress.  HENT:  Head: Normocephalic and atraumatic.  Mouth/Throat: Oropharynx is clear and moist.  Eyes: Conjunctivae and EOM are normal. Pupils are equal, round, and reactive to light. No scleral icterus.  Neck: Normal range of motion. Neck supple. No JVD present. No tracheal deviation present. No thyromegaly  present.  Cardiovascular: Normal rate, regular rhythm and normal heart sounds. Exam reveals no gallop and no friction rub.  No murmur heard. Respiratory: Effort normal and breath sounds normal. No respiratory distress.  GI: Soft. Bowel sounds are normal. He exhibits no distension. There is no tenderness.  Genitourinary:  Genitourinary Comments: Deferred  Musculoskeletal: Normal range of motion. He exhibits no edema.  Lymphadenopathy:    He has no cervical adenopathy.  Neurological: He is alert and oriented to person, place, and time. No cranial nerve deficit.  Skin: Skin is warm and dry. No rash noted. No erythema.  Psychiatric: He has a normal mood and affect. His behavior is normal. Judgment and thought content normal.     Assessment/Plan This is a 83 year old male admitted for respiratory failure. 1.  Respiratory failure: Acute on chronic; with hypoxemia.  Emphysema with superimposed CHF.  Patient has been weaned from BiPAP.  He is currently on oxygen via nasal cannula at 4 L/min.  The patient has more air movement at this time but is also more wheezing.  Continue Solu-Medrol.  Albuterol as needed.  Continue inhaled corticosteroid and Spiriva for COPD.  IV morphine for air hunger and pain in his pelvis/lower back. 2.  Sepsis: The patient meets criteria via tachycardia, tachypnea and leukocytosis.  We will treat for healthcare associated pneumonia.  Continue azithromycin in addition to cefepime  and vancomycin. 3.  CAD: Elevated troponin; likely secondary to demand ischemia.  Continue to follow cardiac biomarkers.  Monitor telemetry.  Continue aspirin.  Consult cardiology at the discretion of the primary team. 4.  CHF: Systolic; acute on chronic.  Elevated BNP.  Continue Lasix IV.  Restart maintenance dose tomorrow 5. Hypertension: Acceptable for age.  Labetalol as needed 6.  DVT prophylaxis: Heparin 7.  GI prophylaxis: None The patient is a DNR.  Time spent on admission orders and patient  care approximately 45 minutes  Harrie Foreman, MD 04/07/2017, 7:19 AM

## 2017-04-08 NOTE — ED Notes (Signed)
Date and time results received: 2016-11-28 0944 (use smartphrase ".now" to insert current time)  Test: Lactic Acid Critical Value: 2.0  Name of Provider Notified: Dr.   Jarrett Ablesrders Received? Or Actions Taken?: Notified

## 2017-04-09 LAB — BASIC METABOLIC PANEL
Anion gap: 8 (ref 5–15)
BUN: 56 mg/dL — ABNORMAL HIGH (ref 6–20)
CO2: 30 mmol/L (ref 22–32)
Calcium: 9.6 mg/dL (ref 8.9–10.3)
Chloride: 103 mmol/L (ref 101–111)
Creatinine, Ser: 1.1 mg/dL (ref 0.61–1.24)
GFR calc Af Amer: >60 mL/min (ref >60)
GFR calc non Af Amer: 52 mL/min — ABNORMAL LOW (ref >60)
Glucose, Bld: 136 mg/dL — ABNORMAL HIGH (ref 65–99)
Potassium: 4 mmol/L (ref 3.5–5.1)
Sodium: 141 mmol/L (ref 135–145)

## 2017-04-09 MED ORDER — ISOSORBIDE DINITRATE 30 MG PO TABS
30.0000 mg | ORAL_TABLET | Freq: Once | ORAL | Status: AC
  Administered 2017-04-09: 30 mg via ORAL
  Filled 2017-04-09: qty 1

## 2017-04-09 MED ORDER — ENSURE ENLIVE PO LIQD: 237.0000 mL | Freq: Two times a day (BID) | ORAL | Status: DC

## 2017-04-09 MED ORDER — FUROSEMIDE 10 MG/ML IJ SOLN
40.0000 mg | Freq: Once | INTRAMUSCULAR | Status: AC
Start: 2017-04-09 — End: 2017-04-09
  Administered 2017-04-09: 40 mg via INTRAVENOUS
  Filled 2017-04-09: qty 4

## 2017-04-09 MED ORDER — FUROSEMIDE 10 MG/ML IJ SOLN
40.0000 mg | Freq: Once | INTRAMUSCULAR | Status: AC
  Administered 2017-04-09: 40 mg via INTRAVENOUS
  Filled 2017-04-09: qty 4

## 2017-04-09 NOTE — Plan of Care (Signed)
  Progressing Safety: Ability to remain free from injury will improve 04/09/2017 1039 - Progressing by Donald ProseBerry, Ormand Senn L, RN Note Fall precautions in place, non skid socks when oob   Not Progressing Clinical Measurements: Will remain free from infection 04/09/2017 1039 - Not Progressing by Donald ProseBerry, Caffie Sotto L, RN Note Remains on IV antibiotics Respiratory complications will improve 04/09/2017 1039 - Not Progressing by Donald ProseBerry, Lener Ventresca L, RN Note Continues to have expiratory wheezes, nebs ordered.  IV Lasix given today Elimination: Will not experience complications related to urinary retention 04/09/2017 1039 - Not Progressing by Donald ProseBerry, Mirage Pfefferkorn L, RN Note Pt has chronic foley for urinary retention Clinical Measurements: Ability to maintain a body temperature in the normal range will improve 04/09/2017 1039 - Not Progressing by Donald ProseBerry, Yossef Gilkison L, RN Note SOB on exertion

## 2017-04-09 NOTE — Progress Notes (Signed)
[employer] - [place] at [facility]                                                                                                                                                                                  Patient Demographics   Daniel Dickson, is a [age] y.o. male, DOB - 05/12/1912, WJX:[id number]  Admit date - 07/12/16   Admitting Physician Arnaldo NatalMichael S Diamond, MD  Outpatient Primary MD for the patient is Dortha KernBliss, Laura K, MD   LOS - 1  Subjective: Admitted again yesterday for shortness of breath, COPD exacerbation, sepsis with pneumonia, non-ST elevation MI.  Still having shortness of breath, wheezing and appears very weak.  Discharged on 26 December for COPD and comes back again because he could not care for himself and could not get medication with his advanced age.  Patient admitted 8 times last 6 months and refusing physical therapy and nursing home placement each time.   Review of Systems:   CONSTITUTIONAL: No documented fever. No fatigue, weakness. No weight gain, no weight loss.  EYES: No blurry or double vision.  ENT: No tinnitus. No postnasal drip. No redness of the oropharynx.  RESPIRATORY: Positive cough, wheezing, shortness of breath. CARDIOVASCULAR: No chest pain. No orthopnea. No palpitations. No syncope.  GASTROINTESTINAL: No nausea, no vomiting or diarrhea. No abdominal pain. No melena or hematochezia.  GENITOURINARY: No dysuria or hematuria.  Foley catheter in place. ENDOCRINE: No polyuria or nocturia. No heat or cold intolerance.  HEMATOLOGY: No anemia. No bruising. No bleeding.  INTEGUMENTARY: No rashes. No lesions.  MUSCULOSKELETAL: No arthritis. No swelling. No gout.  NEUROLOGIC: No numbness, tingling, or ataxia. No seizure-type activity.  PSYCHIATRIC: No anxiety. No insomnia. No ADD.    Vitals:   Vitals:   04/09/17 0310 04/09/17 0745 04/09/17 0755 04/09/17 0825  BP: (!) 148/104  122/66 125/70  Pulse: 92  86 94  Resp: 20  17 20   Temp: (!)  97.3 F (36.3 C)   97.6 F (36.4 C)  TempSrc: Oral   Oral  SpO2: 100% 98% 100% 98%  Weight: 53.1 kg (117 lb)     Height:        Wt Readings from Last 3 Encounters:  04/09/17 53.1 kg (117 lb)  04/05/17 55.2 kg (121 lb 9.6 oz)  03/21/17 54.6 kg (120 lb 4.8 oz)     Intake/Output Summary (Last 24 hours) at 04/09/2017 1240 Last data filed at 04/09/2017 1236 Gross per 24 hour  Intake 1235 ml  Output 1300 ml  Net -65 ml    Physical Exam:   GENERAL: Pleasant-appearing in no apparent distress.  HEAD, EYES, EARS, NOSE AND THROAT: Atraumatic, normocephalic.  Extraocular muscles are intact. Pupils equal and reactive to light. Sclerae anicteric. No conjunctival injection. No oro-pharyngeal erythema.  NECK: Supple. There is no jugular venous distention. No bruits, no lymphadenopathy, no thyromegaly.  HEART: Regular rate and rhythm,. No murmurs, no rubs, no clicks.  LUNGS:  faint wheezing bilaterally.   ABDOMEN: Soft, flat, nontender, nondistended. Has good bowel sounds. No hepatosplenomegaly appreciated.  EXTREMITIES: No evidence of any cyanosis, clubbing, or peripheral edema.  +2 pedal and radial pulses bilaterally.  NEUROLOGIC: The patient is alert, awake, and oriented x3 with no focal motor or sensory deficits appreciated bilaterally.  SKIN: Moist and warm with no rashes appreciated.  Psych: Not anxious, depressed, very hard of hearing. LN: No inguinal LN enlargement    Antibiotics   Anti-infectives (From admission, onward)   Start     Dose/Rate Route Frequency Ordered Stop   04/09/17 0300  vancomycin (VANCOCIN) IVPB 1000 mg/200 mL premix     1,000 mg 200 mL/hr over 60 Minutes Intravenous Every 36 hours 03/31/2017 1036     03/13/2017 2200  ceFEPIme (MAXIPIME) 1 g in dextrose 5 % 50 mL IVPB     1 g 100 mL/hr over 30 Minutes Intravenous Every 24 hours 03/28/2017 1036     03/31/2017 1100  azithromycin (ZITHROMAX) tablet 250 mg    Comments:  Daily for 3 days     250 mg Oral Daily 03/28/2017  1026 04/11/17 0959   03/21/2017 0645  ceFEPIme (MAXIPIME) 2 g in dextrose 5 % 50 mL IVPB     2 g 100 mL/hr over 30 Minutes Intravenous  Once 04/02/2017 0632 03/15/2017 0710   03/26/2017 0645  vancomycin (VANCOCIN) IVPB 1000 mg/200 mL premix     1,000 mg 200 mL/hr over 60 Minutes Intravenous  Once 04/02/2017 1610 04/03/2017 0812   04/07/2017 0631  dextrose 5 % with ceFEPIme (MAXIPIME) ADS Med    Comments:  Darlyn Chamber   : cabinet override      04/02/2017 0631 03/28/2017 1844      Medications   Scheduled Meds: . aspirin  81 mg Oral Daily  . azithromycin  250 mg Oral Daily  . budesonide  0.25 mg Nebulization BID  . clopidogrel  75 mg Oral Daily  . docusate sodium  100 mg Oral BID  . enoxaparin (LOVENOX) injection  30 mg Subcutaneous Q24H  . feeding supplement (ENSURE ENLIVE)  237 mL Oral BID BM  . furosemide  20 mg Oral Daily  . hydrocortisone   Topical BID  . ipratropium-albuterol  3 mL Nebulization Q4H  . mouth rinse  15 mL Mouth Rinse BID  . methylPREDNISolone (SOLU-MEDROL) injection  60 mg Intravenous Q24H  . metoprolol tartrate  25 mg Oral BID  . multivitamin with minerals  1 tablet Oral Daily  . oxybutynin  5 mg Oral QHS  . polyethylene glycol  17 g Oral Daily  . psyllium  1 packet Oral Daily  . tiotropium  18 mcg Inhalation QPM   Continuous Infusions: . ceFEPime (MAXIPIME) IV Stopped (03/20/2017 2230)  . vancomycin Stopped (04/09/17 0634)   PRN Meds:.acetaminophen **OR** acetaminophen, albuterol, albuterol, magnesium hydroxide, nitroGLYCERIN, ondansetron **OR** ondansetron (ZOFRAN) IV   Data Review:   Micro Results Recent Results (from the past 240 hour(s))  Culture, blood (routine x 2) Call MD if unable to obtain prior to antibiotics being given     Status: None   Collection Time: 04/02/17  8:33 PM  Result Value Ref Range Status   Specimen Description BLOOD  RIGHT ANTECUBITAL  Final   Special Requests   Final    BOTTLES DRAWN AEROBIC AND ANAEROBIC Blood Culture adequate volume    Culture   Final    NO GROWTH 5 DAYS Performed at [facility], [address] [place], [place] [address]    Report Status 04/07/2017 FINAL  Final  Culture, blood (routine x 2) Call MD if unable to obtain prior to antibiotics being given     Status: None   Collection Time: 04/02/17  8:33 PM  Result Value Ref Range Status   Specimen Description BLOOD LEFT ANTECUBITAL  Final   Special Requests   Final    BOTTLES DRAWN AEROBIC AND ANAEROBIC Blood Culture adequate volume   Culture   Final    NO GROWTH 5 DAYS Performed at [facility], [address] [place], [place] [address]    Report Status 04/07/2017 FINAL  Final  Culture, blood (routine x 2)     Status: None (Preliminary result)   Collection Time: 05-07-17  5:28 AM  Result Value Ref Range Status   Specimen Description BLOOD LEFT ANTECUBITAL  Final   Special Requests   Final    BOTTLES DRAWN AEROBIC AND ANAEROBIC Blood Culture adequate volume   Culture   Final    NO GROWTH 1 DAY Performed at [facility], [address] [place], [place] [address]    Report Status PENDING  Incomplete  Culture, blood (routine x 2)     Status: None (Preliminary result)   Collection Time: May 07, 2017  5:35 AM  Result Value Ref Range Status   Specimen Description BLOOD RIGHT ARM  Final   Special Requests   Final    BOTTLES DRAWN AEROBIC AND ANAEROBIC Blood Culture adequate volume   Culture   Final    NO GROWTH 1 DAY Performed at [facility], [address], [place], [place] [address]    Report Status PENDING  Incomplete    Radiology Reports Dg Chest Portable 1 View  Result Date: 05/07/2017 CLINICAL DATA:  Acute onset of shortness of breath. EXAM: PORTABLE CHEST 1 VIEW COMPARISON:  Chest radiograph performed 04/02/2017 FINDINGS: The lungs are hyperexpanded, with underlying emphysema. Small bilateral pleural effusions are noted. Mild bibasilar opacities may reflect interstitial edema. There is no evidence  of pneumothorax. The cardiomediastinal silhouette is within normal limits. No acute osseous abnormalities are seen. IMPRESSION: 1. Small bilateral pleural effusions. Mild bibasilar opacities may reflect interstitial edema. 2. Lungs hyperexpanded, with underlying emphysema. Electronically Signed   By: Roanna Raider M.D.   On: 2017/05/07 05:44   Dg Chest Portable 1 View  Result Date: 04/02/2017 CLINICAL DATA:  Initial evaluation for acute shortness of breath. EXAM: PORTABLE CHEST 1 VIEW COMPARISON:  None. FINDINGS: Cardiac and mediastinal silhouettes are stable in size and contour, and remain within normal limits. Aortic atherosclerosis. Lungs normally inflated. Underlying COPD. Small layering bilateral pleural effusions. Associated bibasilar opacities likely atelectasis, although infiltrates could be considered as well. No pulmonary edema. No pneumothorax. No acute osseus abnormality. IMPRESSION: 1. Small layering bilateral pleural effusions with associated bibasilar opacities, likely atelectasis. Infiltrates could be considered in the correct clinical setting. 2. Underlying COPD. 3. Aortic atherosclerosis. Electronically Signed   By: Rise Mu M.D.   On: 04/02/2017 15:52   Dg Shoulder Left  Result Date: 04/02/2017 CLINICAL DATA:  Initial evaluation for acute left shoulder pain. No injury. EXAM: LEFT SHOULDER - 2+ VIEW COMPARISON:  None. FINDINGS: No acute fracture or dislocation. Humeral  head in normal line with the glenoid. AC joint approximated. Heterotopic calcification at the superolateral aspect of the humeral head consistent with calcific tendinopathy. Osteoarthritic changes about the [facility] joint as well. Bones are mildly osteopenic. No soft tissue abnormality. IMPRESSION: 1. No acute osseous abnormality about the left shoulder. 2. Heterotopic calcification at the superolateral aspect of the left femoral head, consistent with underlying calcific tendinopathy. 3. Mild right acromioclavicular  osteoarthrosis. Electronically Signed   By: Rise Mu M.D.   On: 04/02/2017 15:50     CBC Recent Labs  Lab 04/02/17 1505 04/04/17 0530 April 16, 2017 0524  WBC 12.3* 6.6 19.9*  HGB 11.3* 11.6* 11.8*  HCT 34.3* 33.8* 36.0*  PLT 167 167 191  MCV 97.5 96.0 98.3  MCH 32.2 33.0 32.2  MCHC 33.0 34.4 32.8  RDW 16.1* 15.8* 16.4*  LYMPHSABS 1.3  --  0.8*  MONOABS 0.9  --  0.9  EOSABS 0.2  --  0.0  BASOSABS 0.1  --  0.1    Chemistries  Recent Labs  Lab 04/02/17 1505 04/04/17 0530 04-16-2017 0524 04/09/17 0631  NA 133* 131* 138 141  K 3.9 3.9 4.3 4.0  CL 100* 98* 100* 103  CO2 26 25 31 30   GLUCOSE 118* 151* 132* 136*  BUN 32* 33* 53* 56*  CREATININE 1.22 1.16 1.08 1.10  CALCIUM 8.7* 8.6* 9.8 9.6  AST 32  --  43*  --   ALT 21  --  46  --   ALKPHOS 45  --  51  --   BILITOT 0.8  --  1.6*  --    ------------------------------------------------------------------------------------------------------------------ estimated creatinine clearance is 25.5 mL/min (by C-G formula based on SCr of 1.1 mg/dL). ------------------------------------------------------------------------------------------------------------------ No results for input(s): HGBA1C in the last 72 hours. ------------------------------------------------------------------------------------------------------------------ No results for input(s): CHOL, HDL, LDLCALC, TRIG, CHOLHDL, LDLDIRECT in the last 72 hours. ------------------------------------------------------------------------------------------------------------------ No results for input(s): TSH, T4TOTAL, T3FREE, THYROIDAB in the last 72 hours.  Invalid input(s): FREET3 ------------------------------------------------------------------------------------------------------------------ No results for input(s): VITAMINB12, FOLATE, FERRITIN, TIBC, IRON, RETICCTPCT in the last 72 hours.  Coagulation profile No results for input(s): INR, PROTIME in the last 168  hours.  No results for input(s): DDIMER in the last 72 hours.  Cardiac Enzymes Recent Labs  Lab 04-16-2017 1031 04/16/2017 1701 04/16/17 2216  TROPONINI 1.42* 1.85* 1.83*   ------------------------------------------------------------------------------------------------------------------ Invalid input(s): POCBNP    Assessment & Plan   AIMPRESSION AND PLAN: Sepsis present on admission secondary to pneumonia: Continue IV vancomycin, cefepime, \  2 acute on chronic COPD exacerbation: Continue bronchodilators, IV steroids, patient on oxygen at home 2 L.  Continue oxygen. High risk for recurrent admissions, course of DNR, patient is high risk for cardiopulmonary arrest recommend palliative care consult,  nursing home placement this time.  3.  Non-ST elevation MI: Seen by cardiology, added Plavix, nitrates, continue beta-blockers, 40-45%.  Patient has acute on chronic congestive heart failure: Continue IV Lasix.  EKG has some changes of ischemia but unable to get aggressive treatment due to advanced age.  Cardiology did not recommend any invasive cardiac workup at this time.    5  severemalnutrition in the context of chronic illness.  Continue nutritional supplements with Ensure.  Continue dysphagia 3 diet, seen by speech therapy. 6.  Possible allergic reaction to vancomycin, shortness of breath immediately after giving vancomycin.  Reviewed the night nurse note, discontinued vancomycin at this time.    6.  CODE STATUS confirmed with the patient and family he is a DNR  Code Status Orders  (From admission, onward)        Start     Ordered   04/03/17 1120  Do not attempt resuscitation (DNR)  Continuous    Question Answer Comment  In the event of cardiac or respiratory ARREST Do not call a "code blue"   In the event of cardiac or respiratory ARREST Do not perform Intubation, CPR, defibrillation or ACLS   In the event of cardiac or respiratory ARREST Use medication by any route,  position, wound care, and other measures to relive pain and suffering. May use oxygen, suction and manual treatment of airway obstruction as needed for comfort.      04/03/17 1120    Code Status History    Date Active Date Inactive Code Status Order ID Comments User Context   04/02/2017 20:05 04/03/2017 11:20 Full Code [id number]  Bertrum SolSalary, Montell D, MD ED   03/21/2017 03:02 03/23/2017 17:35 Full Code [id number]  Bertrum SolSalary, Montell D, MD Inpatient   03/04/2017 10:38 03/05/2017 17:53 Full Code [id number]  Adrian SaranMody, Sital, MD Inpatient   03/04/2017 09:59 03/04/2017 10:38 DNR [id number]  Adrian SaranMody, Sital, MD Inpatient   01/20/2017 04:46 01/21/2017 16:22 Full Code [id number]  Tonye RoyaltyHugelmeyer, Alexis, DO Inpatient   01/02/2017 19:37 01/03/2017 17:59 DNR [id number]  Milagros LollSudini, Srikar, MD ED   11/12/2016 09:32 11/13/2016 15:37 Full Code [id number]  Gasper LloydJacobs, Monique D, RN Inpatient   11/11/2016 11:23 11/12/2016 09:32 DNR [id number]  Enedina FinnerPatel, Sona, MD Inpatient   10/18/2016 20:36 10/20/2016 19:52 DNR [id number]  Milagros LollSudini, Srikar, MD ED   09/27/2016 15:00 09/28/2016 17:19 DNR [id number]  Altamese DillingVachhani, Vaibhavkumar, MD Inpatient   09/24/2016 08:52 09/27/2016 15:00 Full Code [id number]  Enid BaasKalisetti, Radhika, MD Inpatient   08/30/2016 05:16 09/02/2016 16:04 Full Code [id number]  Arnaldo Nataliamond, Michael S, MD Inpatient   02/18/2016 08:27 02/22/2016 18:50 Full Code [id number]  Enedina FinnerPatel, Sona, MD Inpatient   10/29/2015 21:19 11/02/2015 14:22 Full Code [id number]  Enid BaasKalisetti, Radhika, MD Inpatient   10/07/2015 07:32 10/09/2015 16:29 Full Code [id number]  Ihor AustinPyreddy, Pavan, MD Inpatient   06/07/2015 00:04 06/10/2015 16:31 Full Code [id number]  Oralia ManisWillis, David, MD Inpatient   01/11/2015 13:54 01/12/2015 13:58 Full Code [id number]  Alford HighlandWieting, Richard, MD ED    Advance Directive Documentation     Most Recent Value  Type of Advance Directive  Healthcare Power of Attorney, Living will  Pre-existing out of facility DNR order (yellow form or pink MOST form)  No data  "MOST" Form in Place?  No data            Consults none  DVT Prophylaxis  Lovenox  Lab Results  Component Value Date   PLT 191 20-Jan-2017     Time Spent in minutes   30 minutes, Discharge home today.  Greater than 50% of time spent in care coordination and counseling patient regarding the condition and plan of care.   Katha HammingSnehalatha Kartier Bennison M.D on 04/09/2017 at 12:40 PM  Between 7am to 6pm - Pager - [phone]  After 6pm go to [email or web address] - password [email or web address] [facility]   Office  [phone]

## 2017-04-09 NOTE — Progress Notes (Signed)
A few minutes after hanging vancomycin IV, patient started wheezing and became very dyspneic. Sats 94% on 4L O2.  Nebulizer given.  Stopped vanc and IV fluids. Notified DR. Diamond. Lasix to be ordered. WIll continue to monitor.

## 2017-04-09 NOTE — Progress Notes (Signed)
SUBJECTIVE: Patient is confused   Vitals:   04/09/17 0310 04/09/17 0745 04/09/17 0755 04/09/17 0825  BP: (!) 148/104  122/66 125/70  Pulse: 92  86 94  Resp: 20  17 20   Temp: (!) 97.3 F (36.3 C)   97.6 F (36.4 C)  TempSrc: Oral   Oral  SpO2: 100% 98% 100% 98%  Weight: 117 lb (53.1 kg)     Height:        Intake/Output Summary (Last 24 hours) at 04/09/2017 1117 Last data filed at 04/09/2017 1037 Gross per 24 hour  Intake 1235 ml  Output 1200 ml  Net 35 ml    LABS: Basic Metabolic Panel: Recent Labs    03/31/2017 0524 04/09/17 0631  NA 138 141  K 4.3 4.0  CL 100* 103  CO2 31 30  GLUCOSE 132* 136*  BUN 53* 56*  CREATININE 1.08 1.10  CALCIUM 9.8 9.6   Liver Function Tests: Recent Labs    03/16/2017 0524  AST 43*  ALT 46  ALKPHOS 51  BILITOT 1.6*  PROT 6.4*  ALBUMIN 3.1*   No results for input(s): LIPASE, AMYLASE in the last 72 hours. CBC: Recent Labs    03/30/2017 0524  WBC 19.9*  NEUTROABS 18.1*  HGB 11.8*  HCT 36.0*  MCV 98.3  PLT 191   Cardiac Enzymes: Recent Labs    04/02/2017 1031 03/15/2017 1701 03/27/2017 2216  TROPONINI 1.42* 1.85* 1.83*   BNP: Invalid input(s): POCBNP D-Dimer: No results for input(s): DDIMER in the last 72 hours. Hemoglobin A1C: No results for input(s): HGBA1C in the last 72 hours. Fasting Lipid Panel: No results for input(s): CHOL, HDL, LDLCALC, TRIG, CHOLHDL, LDLDIRECT in the last 72 hours. Thyroid Function Tests: No results for input(s): TSH, T4TOTAL, T3FREE, THYROIDAB in the last 72 hours.  Invalid input(s): FREET3 Anemia Panel: No results for input(s): VITAMINB12, FOLATE, FERRITIN, TIBC, IRON, RETICCTPCT in the last 72 hours.   PHYSICAL EXAM General: Well developed, well nourished, in no acute distress HEENT:  Normocephalic and atramatic Neck:  No JVD.  Lungs: Clear bilaterally to auscultation and percussion. Heart: HRRR . Normal S1 and S2 without gallops or murmurs.  Abdomen: Bowel sounds are positive,  abdomen soft and non-tender  Msk:  Back normal, normal gait. Normal strength and tone for age. Extremities: No clubbing, cyanosis or edema.   Neuro: Alert and oriented X 3. Psych:  Good affect, responds appropriately  TELEMETRY: Sinus rhythm with sinus tachycardia about 112 bpm  ASSESSMENT AND PLAN: Non-STEMI/coronary artery disease. Advise conservative treatment with aggressive medical therapy. Added nitrates on top of Plavix and cardiac point of view can be discharged with follow-up in the office after echocardiogram.  Active Problems:   Acute on chronic respiratory failure with hypoxemia (HCC)    Daniel BlackwaterKHAN,Rikayla Demmon A, MD, [facility] 04/09/2017 11:17 AM

## 2017-04-09 NOTE — Progress Notes (Signed)
Initial Nutrition Assessment  DOCUMENTATION CODES:   Severe malnutrition in context of chronic illness  INTERVENTION:   Will downgrade diet to dysphagia 3 with thin liquids.  Provide Ensure Enlive po BID, each supplement provides 350 kcal and 20 grams of protein.  Continue daily MVI.  NUTRITION DIAGNOSIS:   Severe Malnutrition related to chronic illness(COPD, CHF, advanced age ) as evidenced by severe fat depletion, severe muscle depletion.  GOAL:   Patient will meet greater than or equal to 90% of their needs  MONITOR:   PO intake, Supplement acceptance, Labs, Weight trends, I & O's, Skin  REASON FOR ASSESSMENT:   Malnutrition Screening Tool    ASSESSMENT:   83 year old [place] male with a past medical history of coronary artery disease cardiomyopathy with ejection fraction about 40-45% status post PCI presented to the hospital with shortness of breath and was found to be in congestive heart failure and had elevated troponin.   RD familiar with this pt pt from previus admits. Pt is usually a good eater when he is feeling well. Per chart, pt is eating 25-50% of his meals. Pt likes Ensure; RD will order. RD will downgrade pt to a dysphagia 3 diet as this is easier for him to chew and pt requires chopped meats. Per chart, pt with 4lb(3%) wt loss over the past week. Unsure if this is related to fluid changes. Pt's UBW is around 120lbs. RD will continue to monitor.    Diet Order:  Diet Heart Room service appropriate? Yes; Fluid consistency: Thin  EDUCATION NEEDS:   No education needs have been identified at this time  Skin:  Reviewed RN Assessment  Last BM:  12/29- type 1  Height:   Ht Readings from Last 1 Encounters:  04/07/2017 5\' 7"  (1.702 m)    Weight:   Wt Readings from Last 1 Encounters:  04/09/17 117 lb (53.1 kg)    Ideal Body Weight:  67.27 kg  BMI:  Body mass index is 18.32 kg/m.  Estimated Nutritional Needs:   Kcal:  1630-1900kcal/day   Protein:   82-92g/day   Fluid:  1.6-1.9L/day   Betsey Holidayasey Lendy Dittrich MS, RD, LDN Pager #- [phone] After Hours Pager: [phone]

## 2017-04-10 LAB — CBC
HCT: 30.2 % — ABNORMAL LOW (ref 40.0–52.0)
Hemoglobin: 10 g/dL — ABNORMAL LOW (ref 13.0–18.0)
MCH: 32.3 pg (ref 26.0–34.0)
MCHC: 33.2 g/dL (ref 32.0–36.0)
MCV: 97.3 fL (ref 80.0–100.0)
PLATELETS: 142 10*3/uL — AB (ref 150–440)
RBC: 3.1 MIL/uL — ABNORMAL LOW (ref 4.40–5.90)
RDW: 16.1 % — AB (ref 11.5–14.5)
WBC: 21.3 10*3/uL — AB (ref 3.8–10.6)

## 2017-04-10 MED ORDER — LORAZEPAM 0.5 MG PO TABS
0.5000 mg | ORAL_TABLET | ORAL | Status: DC | PRN
  Administered 2017-04-10: 0.5 mg via ORAL
  Filled 2017-04-10: qty 1

## 2017-04-10 MED ORDER — PREDNISONE 50 MG PO TABS
50.0000 mg | ORAL_TABLET | Freq: Every day | ORAL | Status: DC
  Administered 2017-04-10 – 2017-04-12 (×3): 50 mg via ORAL
  Filled 2017-04-10 (×3): qty 1

## 2017-04-10 MED ORDER — MORPHINE SULFATE 10 MG/5ML PO SOLN
2.5000 mg | ORAL | Status: DC | PRN
  Administered 2017-04-10 – 2017-04-12 (×2): 2.5 mg via ORAL
  Filled 2017-04-10 (×3): qty 5

## 2017-04-10 MED ORDER — MORPHINE SULFATE (PF) 2 MG/ML IV SOLN: 1.0000 mg | INTRAVENOUS | Status: DC | PRN

## 2017-04-10 MED ORDER — LORAZEPAM 2 MG/ML IJ SOLN: 0.5000 mg | INTRAMUSCULAR | Status: DC | PRN

## 2017-04-10 NOTE — Consult Note (Signed)
Pharmacy Antibiotic Note  Daniel Dickson is a [age] y.o. male admitted on 09/24/16 with pneumonia.  Pharmacy has been consulted for cefepime dosing.  Plan: continue cefepime 1g q 24hr  Height: 5\' 7"  (170.2 cm) Weight: 113 lb 11.2 oz (51.6 kg) IBW/kg (Calculated) : 66.1  Temp (24hrs), Avg:97.9 F (36.6 C), Min:97.3 F (36.3 C), Max:98.2 F (36.8 C)  Recent Labs  Lab 04/04/17 0530 2017-02-01 0524 2017-02-01 0648 2017-02-01 0903 04/09/17 0631 04/10/17 0437  WBC 6.6 19.9*  --   --   --  21.3*  CREATININE 1.16 1.08  --   --  1.10  --   LATICACIDVEN  --   --  2.4* 2.0*  --   --     Estimated Creatinine Clearance: 24.8 mL/min (by C-G formula based on SCr of 1.1 mg/dL).    Allergies  Allergen Reactions  . Iodine (Kelp) [Iodine] Nausea And Vomiting    Antimicrobials this admission: vancomycin 12/29 >> 12/30 cefepime 12/29 >>  Azithromycin 12/29>>12/31  Dose adjustments this admission:   Microbiology results: 12/29 BCx: NG 2 days   Thank you for allowing pharmacy to be a part of this patient's care.  Olene FlossMelissa D Maccia, Pharm.D, BCPS Clinical Pharmacist  04/10/2017 1:00 PM

## 2017-04-10 NOTE — Consult Note (Signed)
Consultation Note Date: 04/10/2017   Patient Name: Daniel Dickson  DOB: 10-Dec-1912  MRN: [id number]  Age / Sex: 83 y.o., male  PCP: [name], MD Referring Physician: Katha Hamming, MD  Reason for Consultation: Establishing goals of care  HPI/Patient Profile:  The patient with past medical history of coronary artery disease status post PCI, COPD, ischemic cardiomyopathy and hypertension presents emergency department with shortness of breath.  The patient was just discharged from the hospital 3 days ago after treatment of community-acquired pneumonia.    Clinical Assessment and Goals of Care: Daniel Dickson is resting in chair. He states "I'm ready to go home and pointed upstairs. I'm ready to go home and be with everyone else." He then tells the nurse he wants care, that he changed his mind. Spoke with his daughter and POA, she states she lives next door to him and he calls her frequently. When she doesn't answer, he calls 911. She states he was kicked out of hospice care because he kept coming to the hospital. She states she cannot take him home. She states he will need placement.  Per nursing, he is eating and drinking well.   Per conversation, witnessed by primary RN Merdis Delay via phone, daughter would like to continue current level of care with no escalation to ICU, no pressors, DNI. No replacement of IV or cardiac monitor if patient removes.  Reevaluation in 48 hours for comfort care. Daughter would like to speak with SW about placement.    Daughter Elease Hashimoto is [facility] POA    SUMMARY OF RECOMMENDATIONS    Per conversation, witnessed by primary RN Merdis Delay via phone, daughter would like to continue current level of care with no escalation to ICU, no pressors, DNI. No replacement of IV or cardiac monitor if patient removes.  Reevaluation in 48 hours for comfort care. Daughter would like to speak with SW  about placement.   She is aware if he declines, we may need to evaluate for comfort care sooner.   Code Status/Advance Care Planning:  DNR    Symptom Management:   Morphine for dyspnea  Ativan for anxiety.  Palliative Prophylaxis:   Aspiration and Oral Care  Prognosis:   < 6 months  Discharge Planning: To Be Determined      Primary Diagnoses: Present on Admission: . Acute on chronic respiratory failure with hypoxemia (HCC)   I have reviewed the medical record, interviewed the patient and family, and examined the patient. The following aspects are pertinent.  Past Medical History:  Diagnosis Date  . Anxiety   . BPH (benign prostatic hyperplasia)    a. complicated by urinary retention req chronic foley.  . Cellulitis    a. 10/2016 - Right leg.  . Chronic systolic CHF (congestive heart failure) (HCC)    a. 02/2016 Echo: EF 40-45%, no rwma, mildly dil LA.  Marland Kitchen Chronic Urinary retention    a. Chronic indwelling foley.  Marland Kitchen COPD (chronic obstructive pulmonary disease) (HCC)    a. On chronic supplemental O2 via  [place].  . Coronary artery disease    a. 03/2011 Inflat STEMI/PCI ([facility]): LM nl, LAD 50p, LCX 1m, RI nl, RCA 100p (2.75x22 MDT Integrity BMS)-->post procedure course complicated by RFV DVT req coumadin and then R SFA PSA & hematoma req surgical evacuation.  . DVT (deep venous thrombosis) (HCC)    a. 03/2011 R Femoral Vein DVT following Cath/PCI.  Marland Kitchen Hematuria   . Hemorrhoids   . History of GI diverticular bleed    a. 02/2017 - rectal bleeding felt to be diverticular-->colonoscopy deferrred.  . Hypertensive heart disease   . Ischemic cardiomyopathy    a. 02/2016 Echo: EF 40-45%.  . Mixed hyperlipidemia   . PTSD (post-traumatic stress disorder)    with headaches  . Right SFA Pseudoaneurysm (HCC)    a. 03/2011 following Cath/PCI-->complicated by hematoma req surgical evacuation.   Social History   Socioeconomic History  . Marital status: Widowed    Spouse  name: None  . Number of children: None  . Years of education: None  . Highest education level: None  Social Needs  . Financial resource strain: None  . Food insecurity - worry: None  . Food insecurity - inability: None  . Transportation needs - medical: None  . Transportation needs - non-medical: None  Occupational History  . Occupation: retired  Tobacco Use  . Smoking status: Former Smoker    Packs/day: 3.00    Years: 40.00    Pack years: 120.00    Types: Cigarettes  . Smokeless tobacco: Never Used  . Tobacco comment: quit 62 years  Substance and Sexual Activity  . Alcohol use: Yes    Alcohol/week: 1.8 oz    Types: 3 Glasses of wine per week    Comment: glass of wine at night  . Drug use: No  . Sexual activity: Not Currently  Other Topics Concern  . None  Social History Narrative   Lives in [place] with his 66 y/o son, who has Downs Syndrome.  Pts dtr is a Engineer, civil (consulting) and lives next door.  Pt is retired Tour manager.   Family History  Problem Relation Age of Onset  . COPD Father   . Bladder Cancer Neg Hx   . Kidney cancer Neg Hx   . Prostate cancer Neg Hx    Scheduled Meds: . aspirin  81 mg Oral Daily  . budesonide  0.25 mg Nebulization BID  . clopidogrel  75 mg Oral Daily  . docusate sodium  100 mg Oral BID  . enoxaparin (LOVENOX) injection  30 mg Subcutaneous Q24H  . feeding supplement (ENSURE ENLIVE)  237 mL Oral BID BM  . furosemide  20 mg Oral Daily  . hydrocortisone   Topical BID  . ipratropium-albuterol  3 mL Nebulization Q4H  . mouth rinse  15 mL Mouth Rinse BID  . methylPREDNISolone (SOLU-MEDROL) injection  60 mg Intravenous Q24H  . metoprolol tartrate  25 mg Oral BID  . multivitamin with minerals  1 tablet Oral Daily  . oxybutynin  5 mg Oral QHS  . polyethylene glycol  17 g Oral Daily  . psyllium  1 packet Oral Daily  . tiotropium  18 mcg Inhalation QPM   Continuous Infusions: . ceFEPime (MAXIPIME) IV Stopped (04/09/17 2330)    PRN Meds:.acetaminophen **OR** acetaminophen, albuterol, albuterol, LORazepam, magnesium hydroxide, morphine injection, nitroGLYCERIN, ondansetron **OR** ondansetron (ZOFRAN) IV Medications Prior to Admission:  Prior to Admission medications   Medication Sig Start Date End Date Taking? Authorizing  Provider  acetaminophen (TYLENOL) 500 MG tablet Take 500 mg by mouth daily as needed for mild pain or moderate pain.    Yes [provider]  albuterol (PROAIR HFA) 108 (90 BASE) MCG/ACT inhaler Inhale 2 puffs into the lungs every 6 (six) hours as needed. For wheezing.   Yes [provider]  albuterol (PROVENTIL) (2.5 MG/3ML) 0.083% nebulizer solution Take 3 mLs (2.5 mg total) by nebulization every 4 (four) hours as needed for wheezing or shortness of breath. 09/28/16  Yes Milagros LollSudini, Srikar, MD  amoxicillin-clavulanate (AUGMENTIN) 875-125 MG tablet Take 1 tablet by mouth 2 (two) times daily for 14 days. 04/05/17 04/19/17 Yes Katha HammingKonidena, Snehalatha, MD  aspirin (ASPIRIN CHILDRENS) 81 MG chewable tablet Chew 1 tablet (81 mg total) by mouth daily. 03/23/17 03/23/18 Yes Katha HammingKonidena, Snehalatha, MD  azithromycin (ZITHROMAX) 250 MG tablet Daily for 3 days 04/05/17  Yes Katha HammingKonidena, Snehalatha, MD  budesonide (PULMICORT) 0.25 MG/2ML nebulizer solution Take 2 mLs (0.25 mg total) by nebulization 2 (two) times daily. 04/05/17  Yes Katha HammingKonidena, Snehalatha, MD  furosemide (LASIX) 40 MG tablet Take 0.5 tablets (20 mg total) by mouth daily. 01/04/17  Yes Sainani, Rolly PancakeVivek J, MD  ipratropium-albuterol (DUONEB) 0.5-2.5 (3) MG/3ML SOLN Take 3 mLs by nebulization every 4 (four) hours. 03/23/17  Yes Katha HammingKonidena, Snehalatha, MD  magnesium hydroxide (MILK OF MAGNESIA) 400 MG/5ML suspension Take 30 mLs by mouth 2 (two) times daily as needed for mild constipation or moderate constipation. 11/02/15  Yes Katharina CaperVaickute, Rima, MD  Multiple Vitamin (MULTIVITAMIN WITH MINERALS) TABS tablet Take 1 tablet by mouth daily. 03/24/17  Yes Katha HammingKonidena,  Snehalatha, MD  nitroGLYCERIN (NITROSTAT) 0.4 MG SL tablet Place 0.4 mg under the tongue every 5 (five) minutes as needed for chest pain. Reported on 10/19/2015   Yes [provider]  oxybutynin (DITROPAN-XL) 5 MG 24 hr tablet Take 1 tablet (5 mg total) by mouth at bedtime. 03/23/17  Yes Katha HammingKonidena, Snehalatha, MD  polyethylene glycol (MIRALAX / GLYCOLAX) packet Take 17 g by mouth daily. 03/05/17  Yes Mody, Patricia PesaSital, MD  predniSONE (STERAPRED UNI-PAK 21 TAB) 10 MG (21) TBPK tablet Taper by 10 mg daily 04/05/17  Yes Katha HammingKonidena, Snehalatha, MD  psyllium (HYDROCIL/METAMUCIL) 95 % PACK Take 1 packet by mouth daily. 03/05/17  Yes Adrian SaranMody, Sital, MD  tiotropium (SPIRIVA) 18 MCG inhalation capsule Place 1 capsule (18 mcg total) into inhaler and inhale every evening. 10/09/15 01/14/18 Yes Mody, Patricia PesaSital, MD  feeding supplement (BOOST / RESOURCE BREEZE) LIQD Take 1 Container by mouth 3 (three) times daily between meals. 11/02/15   Katharina CaperVaickute, Rima, MD  hydrocortisone (ANUSOL-HC) 2.5 % rectal cream Apply topically 2 (two) times daily. 03/23/17   Katha HammingKonidena, Snehalatha, MD   Allergies  Allergen Reactions  . Iodine (Kelp) [Iodine] Nausea And Vomiting   Review of Systems  Respiratory: Positive for shortness of breath.     Physical Exam  Constitutional:  Sitting in chair.   Pulmonary/Chest:  Slight increased effort.   Neurological: He is alert.    Vital Signs: BP (!) 143/75 (BP Location: Right Arm)   Pulse (!) 103   Temp 98.2 F (36.8 C)   Resp 18   Ht 5\' 7"  (1.702 m)   Wt 51.6 kg (113 lb 11.2 oz)   SpO2 93%   BMI 17.81 kg/m  Pain Assessment: No/denies pain   Pain Score: 0-No pain   SpO2: SpO2: 93 % O2 Device:SpO2: 93 % O2 Flow Rate: .O2 Flow Rate (L/min): 2 L/min  IO: Intake/output summary:   Intake/Output  Summary (Last 24 hours) at 04/10/2017 1612 Last data filed at 04/09/2017 1853 Gross per 24 hour  Intake 0 ml  Output -  Net 0 ml    LBM: Last BM Date: 04/10/17 Baseline Weight:  Weight: 56.7 kg (125 lb) Most recent weight: Weight: 51.6 kg (113 lb 11.2 oz)     Palliative Assessment/Data: 30%     Time In: 3:00 Time Out: 4:20 Time Total: 1 hr 20 min Greater than 50%  of this time was spent counseling and coordinating care related to the above assessment and plan.  Signed by: Morton Stallrystal Esmerelda Finnigan, NP   Please contact Palliative Medicine Team phone at [phone] for questions and concerns.  For individual provider: See Loretha StaplerAmion

## 2017-04-10 NOTE — Progress Notes (Signed)
[employer] - [place] at [facility]                                                                                                                                                                                  Patient Demographics   Daniel Dickson, is a 83 y.o. male, DOB - 1913/04/04, MWU:[id number]  Admit date - 03/22/2017   Admitting Physician Arnaldo Natal, MD  Outpatient Primary MD for the patient is [name], MD   LOS - 2  Subjective: Admitted again yesterday for shortness of breath, COPD exacerbation, sepsis with pneumonia, non-ST elevation MI.  Patient me and also nurse that he wanted to be comfortable, does not want any pills.  Discussed with patient's daughter about patient's wishes to be comfortable.  Palliative care is consulted.  Patient pulled out IV access.  Can transfer the patient to 1c if needed.   Review of Systems:   CONSTITUTIONAL: No documented fever. No fatigue, weakness. No weight gain, no weight loss.  EYES: No blurry or double vision.  ENT: No tinnitus. No postnasal drip. No redness of the oropharynx.  RESPIRATORY:   positive cough,less  Wheezing.has, shortness of breath. CARDIOVASCULAR: No chest pain. No orthopnea. No palpitations. No syncope.  GASTROINTESTINAL: No nausea, no vomiting or diarrhea. No abdominal pain. No melena or hematochezia.  GENITOURINARY: No dysuria or hematuria.  Foley catheter in place. ENDOCRINE: No polyuria or nocturia. No heat or cold intolerance.  HEMATOLOGY: No anemia. No bruising. No bleeding.  INTEGUMENTARY: No rashes. No lesions.  MUSCULOSKELETAL: No arthritis. No swelling. No gout.  NEUROLOGIC: No numbness, tingling, or ataxia. No seizure-type activity.  PSYCHIATRIC: No anxiety. No insomnia. No ADD.    Vitals:   Vitals:   04/10/17 0505 04/10/17 0749 04/10/17 1121 04/10/17 1649  BP: 111/66 (!) 143/75  (!) 141/70  Pulse: 99 (!) 103  (!) 101  Resp: 18 18  18   Temp: 98.1 F (36.7 C) 98.2 F (36.8  C)  98.1 F (36.7 C)  TempSrc: Oral   Oral  SpO2: 92% 98% 93% 93%  Weight: 51.6 kg (113 lb 11.2 oz)     Height:        Wt Readings from Last 3 Encounters:  04/10/17 51.6 kg (113 lb 11.2 oz)  04/05/17 55.2 kg (121 lb 9.6 oz)  03/21/17 54.6 kg (120 lb 4.8 oz)     Intake/Output Summary (Last 24 hours) at 04/10/2017 1701 Last data filed at 04/09/2017 1853 Gross per 24 hour  Intake 0 ml  Output -  Net 0 ml    Physical Exam:   GENERAL: Pleasant-appearing in no apparent distress.  HEAD, EYES, EARS, NOSE AND  THROAT: Atraumatic, normocephalic. Extraocular muscles are intact. Pupils equal and reactive to light. Sclerae anicteric. No conjunctival injection. No oro-pharyngeal erythema.  NECK: Supple. There is no jugular venous distention. No bruits, no lymphadenopathy, no thyromegaly.  HEART: Regular rate and rhythm,. No murmurs, no rubs, no clicks.  LUNGS:  faint wheezing bilaterally.   ABDOMEN: Soft, flat, nontender, nondistended. Has good bowel sounds. No hepatosplenomegaly appreciated.  EXTREMITIES: No evidence of any cyanosis, clubbing, or peripheral edema.  +2 pedal and radial pulses bilaterally.  NEUROLOGIC: The patient is alert, awake, and oriented x3 with no focal motor or sensory deficits appreciated bilaterally.  SKIN: Moist and warm with no rashes appreciated.  Psych: Not anxious, depressed, very hard of hearing. LN: No inguinal LN enlargement    Antibiotics   Anti-infectives (From admission, onward)   Start     Dose/Rate Route Frequency Ordered Stop   04/09/17 0300  vancomycin (VANCOCIN) IVPB 1000 mg/200 mL premix  Status:  Discontinued     1,000 mg 200 mL/hr over 60 Minutes Intravenous Every 36 hours 04/07/2017 1036 04/09/17 1302   03/21/2017 2200  ceFEPIme (MAXIPIME) 1 g in dextrose 5 % 50 mL IVPB     1 g 100 mL/hr over 30 Minutes Intravenous Every 24 hours 03/18/2017 1036     03/13/2017 1100  azithromycin (ZITHROMAX) tablet 250 mg    Comments:  Daily for 3 days     250  mg Oral Daily 03/11/2017 1026 04/10/17 0906   04/09/2017 0645  ceFEPIme (MAXIPIME) 2 g in dextrose 5 % 50 mL IVPB     2 g 100 mL/hr over 30 Minutes Intravenous  Once 03/21/2017 0632 03/12/2017 0710   04/02/2017 0645  vancomycin (VANCOCIN) IVPB 1000 mg/200 mL premix     1,000 mg 200 mL/hr over 60 Minutes Intravenous  Once 04/02/2017 1610 03/12/2017 0812   04/03/2017 0631  dextrose 5 % with ceFEPIme (MAXIPIME) ADS Med    Comments:  Darlyn Chamber   : cabinet override      04/04/2017 0631 03/21/2017 1844      Medications   Scheduled Meds: . aspirin  81 mg Oral Daily  . budesonide  0.25 mg Nebulization BID  . clopidogrel  75 mg Oral Daily  . docusate sodium  100 mg Oral BID  . enoxaparin (LOVENOX) injection  30 mg Subcutaneous Q24H  . feeding supplement (ENSURE ENLIVE)  237 mL Oral BID BM  . furosemide  20 mg Oral Daily  . hydrocortisone   Topical BID  . ipratropium-albuterol  3 mL Nebulization Q4H  . mouth rinse  15 mL Mouth Rinse BID  . metoprolol tartrate  25 mg Oral BID  . multivitamin with minerals  1 tablet Oral Daily  . oxybutynin  5 mg Oral QHS  . polyethylene glycol  17 g Oral Daily  . predniSONE  50 mg Oral Q breakfast  . psyllium  1 packet Oral Daily  . tiotropium  18 mcg Inhalation QPM   Continuous Infusions: . ceFEPime (MAXIPIME) IV Stopped (04/09/17 2330)   PRN Meds:.acetaminophen **OR** acetaminophen, albuterol, albuterol, LORazepam, magnesium hydroxide, morphine, nitroGLYCERIN, ondansetron **OR** ondansetron (ZOFRAN) IV   Data Review:   Micro Results Recent Results (from the past 240 hour(s))  Culture, blood (routine x 2) Call MD if unable to obtain prior to antibiotics being given     Status: None   Collection Time: 04/02/17  8:33 PM  Result Value Ref Range Status   Specimen Description BLOOD RIGHT ANTECUBITAL  Final  Special Requests   Final    BOTTLES DRAWN AEROBIC AND ANAEROBIC Blood Culture adequate volume   Culture   Final    NO GROWTH 5 DAYS Performed at [facility], [address] [place], [place] [address]    Report Status 04/07/2017 FINAL  Final  Culture, blood (routine x 2) Call MD if unable to obtain prior to antibiotics being given     Status: None   Collection Time: 04/02/17  8:33 PM  Result Value Ref Range Status   Specimen Description BLOOD LEFT ANTECUBITAL  Final   Special Requests   Final    BOTTLES DRAWN AEROBIC AND ANAEROBIC Blood Culture adequate volume   Culture   Final    NO GROWTH 5 DAYS Performed at [facility], [address] [place], [place] [address]    Report Status 04/07/2017 FINAL  Final  Culture, blood (routine x 2)     Status: None (Preliminary result)   Collection Time: 2016-12-27  5:28 AM  Result Value Ref Range Status   Specimen Description BLOOD LEFT ANTECUBITAL  Final   Special Requests   Final    BOTTLES DRAWN AEROBIC AND ANAEROBIC Blood Culture adequate volume   Culture   Final    NO GROWTH 2 DAYS Performed at [facility], [address] [place], [place] [address]    Report Status PENDING  Incomplete  Culture, blood (routine x 2)     Status: None (Preliminary result)   Collection Time: 2016-12-27  5:35 AM  Result Value Ref Range Status   Specimen Description BLOOD RIGHT ARM  Final   Special Requests   Final    BOTTLES DRAWN AEROBIC AND ANAEROBIC Blood Culture adequate volume   Culture   Final    NO GROWTH 2 DAYS Performed at [facility], [address] [place], [place] [address]    Report Status PENDING  Incomplete    Radiology Reports Dg Chest Portable 1 View  Result Date: Nov 19, 2016 CLINICAL DATA:  Acute onset of shortness of breath. EXAM: PORTABLE CHEST 1 VIEW COMPARISON:  Chest radiograph performed 04/02/2017 FINDINGS: The lungs are hyperexpanded, with underlying emphysema. Small bilateral pleural effusions are noted. Mild bibasilar opacities may reflect interstitial edema. There is no evidence of pneumothorax. The cardiomediastinal silhouette is within  normal limits. No acute osseous abnormalities are seen. IMPRESSION: 1. Small bilateral pleural effusions. Mild bibasilar opacities may reflect interstitial edema. 2. Lungs hyperexpanded, with underlying emphysema. Electronically Signed   By: Roanna RaiderJeffery  Chang M.D.   On: Nov 19, 2016 05:44   Dg Chest Portable 1 View  Result Date: 04/02/2017 CLINICAL DATA:  Initial evaluation for acute shortness of breath. EXAM: PORTABLE CHEST 1 VIEW COMPARISON:  None. FINDINGS: Cardiac and mediastinal silhouettes are stable in size and contour, and remain within normal limits. Aortic atherosclerosis. Lungs normally inflated. Underlying COPD. Small layering bilateral pleural effusions. Associated bibasilar opacities likely atelectasis, although infiltrates could be considered as well. No pulmonary edema. No pneumothorax. No acute osseus abnormality. IMPRESSION: 1. Small layering bilateral pleural effusions with associated bibasilar opacities, likely atelectasis. Infiltrates could be considered in the correct clinical setting. 2. Underlying COPD. 3. Aortic atherosclerosis. Electronically Signed   By: Rise MuBenjamin  McClintock M.D.   On: 04/02/2017 15:52   Dg Shoulder Left  Result Date: 04/02/2017 CLINICAL DATA:  Initial evaluation for acute left shoulder pain. No injury. EXAM: LEFT SHOULDER - 2+ VIEW COMPARISON:  None. FINDINGS: No acute fracture or dislocation. Humeral head in normal line with the  glenoid. AC joint approximated. Heterotopic calcification at the superolateral aspect of the humeral head consistent with calcific tendinopathy. Osteoarthritic changes about the [facility] joint as well. Bones are mildly osteopenic. No soft tissue abnormality. IMPRESSION: 1. No acute osseous abnormality about the left shoulder. 2. Heterotopic calcification at the superolateral aspect of the left femoral head, consistent with underlying calcific tendinopathy. 3. Mild right acromioclavicular osteoarthrosis. Electronically Signed   By: Rise MuBenjamin   McClintock M.D.   On: 04/02/2017 15:50     CBC Recent Labs  Lab 04/04/17 0530 03/11/2017 0524 04/10/17 0437  WBC 6.6 19.9* 21.3*  HGB 11.6* 11.8* 10.0*  HCT 33.8* 36.0* 30.2*  PLT 167 191 142*  MCV 96.0 98.3 97.3  MCH 33.0 32.2 32.3  MCHC 34.4 32.8 33.2  RDW 15.8* 16.4* 16.1*  LYMPHSABS  --  0.8*  --   MONOABS  --  0.9  --   EOSABS  --  0.0  --   BASOSABS  --  0.1  --     Chemistries  Recent Labs  Lab 04/04/17 0530 04/06/2017 0524 04/09/17 0631  NA 131* 138 141  K 3.9 4.3 4.0  CL 98* 100* 103  CO2 25 31 30   GLUCOSE 151* 132* 136*  BUN 33* 53* 56*  CREATININE 1.16 1.08 1.10  CALCIUM 8.6* 9.8 9.6  AST  --  43*  --   ALT  --  46  --   ALKPHOS  --  51  --   BILITOT  --  1.6*  --    ------------------------------------------------------------------------------------------------------------------ estimated creatinine clearance is 24.8 mL/min (by C-G formula based on SCr of 1.1 mg/dL). ------------------------------------------------------------------------------------------------------------------ No results for input(s): HGBA1C in the last 72 hours. ------------------------------------------------------------------------------------------------------------------ No results for input(s): CHOL, HDL, LDLCALC, TRIG, CHOLHDL, LDLDIRECT in the last 72 hours. ------------------------------------------------------------------------------------------------------------------ No results for input(s): TSH, T4TOTAL, T3FREE, THYROIDAB in the last 72 hours.  Invalid input(s): FREET3 ------------------------------------------------------------------------------------------------------------------ No results for input(s): VITAMINB12, FOLATE, FERRITIN, TIBC, IRON, RETICCTPCT in the last 72 hours.  Coagulation profile No results for input(s): INR, PROTIME in the last 168 hours.  No results for input(s): DDIMER in the last 72 hours.  Cardiac Enzymes Recent Labs  Lab 04/09/2017 1031  04/04/2017 1701 04/06/2017 2216  TROPONINI 1.42* 1.85* 1.83*   ------------------------------------------------------------------------------------------------------------------ Invalid input(s): POCBNP    Assessment & Plan   AIMPRESSION AND PLAN: Sepsis present on admission secondary to pneumonia: Continue IV vancomycin, cefepime, \  2 acute on chronic COPD exacerbation: Continue bronchodilators, use prednisone, patient pulled out IV access., patient on oxygen at home 2 L.  Continue oxygen. High risk for recurrent admissions, course of DNR, patient is high risk for cardiopulmonary arrest recommend palliative care consulted.  3.  Non-ST elevation MI: Seen by cardiology, added Plavix, nitrates, continue beta-blockers, 40-45%.  Patient has acute on chronic congestive heart failure: Continue IV Lasix.  EKG has some changes of ischemia but unable to get aggressive treatment due to advanced age.  Cardiology did not recommend any invasive cardiac workup at this time.    5  severemalnutrition in the context of chronic illness.  Continue nutritional supplements with Ensure.  Continue dysphagia 3 diet, seen by speech therapy. 6.  Continue current level of care, added morphine and Ativan by palliative care and also discussed with patient's daughter, if patient declines patient will be evaluated for comfort care sooner.  Family wants to wait and reevaluate in 48 hours.    6.  CODE STATUS confirmed with the patient and family he is a  DNR     Code Status Orders  (From admission, onward)        Start     Ordered   04/03/17 1120  Do not attempt resuscitation (DNR)  Continuous    Question Answer Comment  In the event of cardiac or respiratory ARREST Do not call a "code blue"   In the event of cardiac or respiratory ARREST Do not perform Intubation, CPR, defibrillation or ACLS   In the event of cardiac or respiratory ARREST Use medication by any route, position, wound care, and other measures  to relive pain and suffering. May use oxygen, suction and manual treatment of airway obstruction as needed for comfort.      04/03/17 1120    Code Status History    Date Active Date Inactive Code Status Order ID Comments User Context   04/02/2017 20:05 04/03/2017 11:20 Full Code [id number]  Bertrum Sol, MD ED   03/21/2017 03:02 03/23/2017 17:35 Full Code [id number]  Bertrum Sol, MD Inpatient   03/04/2017 10:38 03/05/2017 17:53 Full Code [id number]  Adrian Saran, MD Inpatient   03/04/2017 09:59 03/04/2017 10:38 DNR [id number]  Adrian Saran, MD Inpatient   01/20/2017 04:46 01/21/2017 16:22 Full Code [id number]  Tonye Royalty, DO Inpatient   01/02/2017 19:37 01/03/2017 17:59 DNR [id number]  Milagros Loll, MD ED   11/12/2016 09:32 11/13/2016 15:37 Full Code [id number]  Gasper Lloyd, RN Inpatient   11/11/2016 11:23 11/12/2016 09:32 DNR [id number]  Enedina Finner, MD Inpatient   10/18/2016 20:36 10/20/2016 19:52 DNR [id number]  Milagros Loll, MD ED   09/27/2016 15:00 09/28/2016 17:19 DNR [id number]  Altamese Dilling, MD Inpatient   09/24/2016 08:52 09/27/2016 15:00 Full Code [id number]  Enid Baas, MD Inpatient   08/30/2016 05:16 09/02/2016 16:04 Full Code [id number]  Arnaldo Natal, MD Inpatient   02/18/2016 08:27 02/22/2016 18:50 Full Code [id number]  Enedina Finner, MD Inpatient   10/29/2015 21:19 11/02/2015 14:22 Full Code [id number]  Enid Baas, MD Inpatient   10/07/2015 07:32 10/09/2015 16:29 Full Code [id number]  Ihor Austin, MD Inpatient   06/07/2015 00:04 06/10/2015 16:31 Full Code [id number]  Oralia Manis, MD Inpatient   01/11/2015 13:54 01/12/2015 13:58 Full Code [id number]  Alford Highland, MD ED    Advance Directive Documentation     Most Recent Value  Type of Advance Directive  Healthcare Power of Attorney, Living will  Pre-existing out of facility DNR order (yellow form or pink MOST form)  No data  "MOST" Form in Place?  No data           Consults none  DVT  Prophylaxis  Lovenox  Lab Results  Component Value Date   PLT 142 (L) 04/10/2017     Time Spent in minutes   30 minutes, Discharge home today.  Greater than 50% of time spent in care coordination and counseling patient regarding the condition and plan of care.   Katha Hamming M.D on 04/10/2017 at 5:01 PM  Between 7am to 6pm - Pager - [phone]  After 6pm go to [email or web address] - password [email or web address] [facility]  [facility]   Office  [phone]

## 2017-04-10 NOTE — Care Management (Signed)
Spoke with Dedra SkeensGwen from [facility] and informed that patient was not accepted back under service at time of last discharge on 12/26.  Patient is not aware of that he was not accepted back for service. Contacted agency again to clarify and informed that agency made a decision to not to continue to provide service.  patient has now had 8 admissions within the last 6 months.  having home health services and hospice service has not decreased patient's presentations. Will discuss with family

## 2017-04-10 NOTE — Care Management (Signed)
Patient "demands" that CM send a nursing home representative in his room "right now" to tell him how much it would cost him to go to a facility.  CM reminded patient that it is 5pm on New Years Eve and there will not be anyone available to meet with him until Wednesday.  Patient has [employer] insurance which would require prior approval for skilled nursing stay.  At baseline, patient ambulation ability is very limited. Unsure of patient's acutal rehab potential.  Spoke with his daughter Elease Hashimotoatricia and she is firm in her statement that patient can not return home as he requires too much care.  Discussed that patient would have to be in agreement as no one has stated he can not make his own decisions. CM discussed that if [employer] does not provide approval for skilled nursing, patient would have to pay out of pocket.  Elease Hashimotoatricia says that patient does not have the financial resources to do that but he does have VA benefits and she has been  told "all I have to do is find a facility and VA will pay for it."   Discussed that [facility] has a Community [place]contract [place] the [place].  If patient ends up refusing to go to facility, [facility] will consider opening to services.  Obtained order for PT and OT. UPdated CSW

## 2017-04-10 NOTE — Progress Notes (Signed)
Went in to round on patient. Found IV on the floor along with his tele stickers. Pt had pulled out his IV and took tele off. When asked what happened, pt stated "take these things off of me". Educated patient on the importance of keeping the tele monitor on and patient agreed for me to put it back on. Tele monitor back on, cleaned arm where the IV was and noticed there was a small skin tear where he pulled the iv out from. Area cleansed and put mepitel dressing on and wrapped in gauze. MD notified patient has no iv access, no new orders. Patient also demanded his bracelets to be taken off. Was able to get patient to keep ID bracelet on but he took off the DNR, fall, and allergy bracelets off. Will continue to monitor patient. At this time he is settled in the chair with the chair alarm on.

## 2017-04-10 NOTE — Progress Notes (Signed)
SUBJECTIVE: Patient is more comfortable this morning   Vitals:   04/09/17 1626 04/09/17 2052 04/10/17 0505 04/10/17 0749  BP: 113/67 111/64 111/66 (!) 143/75  Pulse: 100 95 99 (!) 103  Resp: 20 17 18 18   Temp: (!) 97.3 F (36.3 C) 98.1 F (36.7 C) 98.1 F (36.7 C) 98.2 F (36.8 C)  TempSrc: Oral Oral Oral   SpO2: 100% 100% 92% 98%  Weight:   113 lb 11.2 oz (51.6 kg)   Height:        Intake/Output Summary (Last 24 hours) at 04/10/2017 0925 Last data filed at 04/09/2017 1853 Gross per 24 hour  Intake 120 ml  Output 550 ml  Net -430 ml    LABS: Basic Metabolic Panel: Recent Labs    03/22/2017 0524 04/09/17 0631  NA 138 141  K 4.3 4.0  CL 100* 103  CO2 31 30  GLUCOSE 132* 136*  BUN 53* 56*  CREATININE 1.08 1.10  CALCIUM 9.8 9.6   Liver Function Tests: Recent Labs    03/27/2017 0524  AST 43*  ALT 46  ALKPHOS 51  BILITOT 1.6*  PROT 6.4*  ALBUMIN 3.1*   No results for input(s): LIPASE, AMYLASE in the last 72 hours. CBC: Recent Labs    03/27/2017 0524 04/10/17 0437  WBC 19.9* 21.3*  NEUTROABS 18.1*  --   HGB 11.8* 10.0*  HCT 36.0* 30.2*  MCV 98.3 97.3  PLT 191 142*   Cardiac Enzymes: Recent Labs    04/07/2017 1031 03/21/2017 1701 03/12/2017 2216  TROPONINI 1.42* 1.85* 1.83*   BNP: Invalid input(s): POCBNP D-Dimer: No results for input(s): DDIMER in the last 72 hours. Hemoglobin A1C: No results for input(s): HGBA1C in the last 72 hours. Fasting Lipid Panel: No results for input(s): CHOL, HDL, LDLCALC, TRIG, CHOLHDL, LDLDIRECT in the last 72 hours. Thyroid Function Tests: No results for input(s): TSH, T4TOTAL, T3FREE, THYROIDAB in the last 72 hours.  Invalid input(s): FREET3 Anemia Panel: No results for input(s): VITAMINB12, FOLATE, FERRITIN, TIBC, IRON, RETICCTPCT in the last 72 hours.   PHYSICAL EXAM General: Well developed, well nourished, in no acute distress HEENT:  Normocephalic and atramatic Neck:  No JVD.  Lungs: Clear bilaterally to  auscultation and percussion. Heart: HRRR . Normal S1 and S2 without gallops or murmurs.  Abdomen: Bowel sounds are positive, abdomen soft and non-tender  Msk:  Back normal, normal gait. Normal strength and tone for age. Extremities: No clubbing, cyanosis or edema.   Neuro: Alert and oriented X 3. Psych:  Good affect, responds appropriately  TELEMETRY: Sinus rhythm  ASSESSMENT AND PLAN: Non-STEMI, conservatively being treated with aggressive treatment with medications and clinically improving. Continue current medications.  Active Problems:   Acute on chronic respiratory failure with hypoxemia (HCC)    Adrian BlackwaterKHAN, A, MD, [facility] 04/10/2017 9:25 AM

## 2017-04-11 ENCOUNTER — Inpatient Hospital Stay
Admit: 2017-04-11 | Discharge: 2017-04-11 | Disposition: A | Discharge status: Home / Self Care | Payer: Medicare Other | Attending: Cardiovascular Disease | Admitting: Cardiovascular Disease

## 2017-04-11 DIAGNOSIS — L8990 Pressure ulcer of unspecified site, unspecified stage: Secondary | ICD-10-CM

## 2017-04-11 LAB — ECHOCARDIOGRAM COMPLETE
Height: 67 in
Weight: 1830.4 oz

## 2017-04-11 MED ORDER — LEVOFLOXACIN 750 MG PO TABS
750.0000 mg | ORAL_TABLET | ORAL | Status: DC
  Administered 2017-04-11: 750 mg via ORAL
  Filled 2017-04-11: qty 1

## 2017-04-11 NOTE — Care Management (Signed)
PT held therapy today due to elevated troponins. Reaching out to attending and informed that patient can receive physical therapy evaluation and treatment.  OT attempted to evaluate but patient seemed very sleepy.  Discussed with CSW that have concern that patient will not participate or be able to participate in a skilled plan of care at a skilled nursing facility. May need to consider long term plan of care/ palliative or hospice plan of care at a facility [place] the [place]. VA [place] closed today due to the holiday.

## 2017-04-11 NOTE — Consult Note (Signed)
Pharmacy Antibiotic Note  Daniel Dickson is a 84 y.o. male admitted on 03/19/2017 with pneumonia.  Pharmacy has been consulted for cefepime dosing.   1/1: Patient pulled IV access and no plans to replace IV with plans leaning toward palliative care per RN. MD wants to treat PNA with oral Levaquin.   Plan: Levaquin 750 mg po qod.   Height: 5\' 7"  (170.2 cm) Weight: 114 lb 6.4 oz (51.9 kg) IBW/kg (Calculated) : 66.1  Temp (24hrs), Avg:98 F (36.7 C), Min:97.7 F (36.5 C), Max:98.3 F (36.8 C)  Recent Labs  Lab 04/10/2017 0524 03/22/2017 0648 04/02/2017 0903 04/09/17 0631 04/10/17 0437  WBC 19.9*  --   --   --  21.3*  CREATININE 1.08  --   --  1.10  --   LATICACIDVEN  --  2.4* 2.0*  --   --     Estimated Creatinine Clearance: 24.9 mL/min (by C-G formula based on SCr of 1.1 mg/dL).    Allergies  Allergen Reactions  . Iodine (Kelp) [Iodine] Nausea And Vomiting    Antimicrobials this admission: vancomycin 12/29 >> 12/30 cefepime 12/29 >> 1/1 Azithromycin 12/29>>12/31 Levaquin 1/1 >>  Dose adjustments this admission:   Microbiology results: 12/29 BCx: NGTD   Thank you for allowing pharmacy to be a part of this patient's care.  Luisa HartScott Margaretann Abate, PharmD Clinical Pharmacist  04/11/2017 1:20 PM

## 2017-04-11 NOTE — Plan of Care (Signed)
Patient has rested quietly in bed today. Arouses easily but is not very talkative. Able to follow commands. Very poor appetite, sleeping most of the day.

## 2017-04-11 NOTE — Progress Notes (Signed)
[employer] [facility] [place] at [facility]                                                                                                                                                                                  Patient Demographics   Daniel Dickson, is a 84 y.o. male, DOB - 07/15/1912, ZOX:[id number]  Admit date - 04/07/2017   Admitting Physician Arnaldo Natal, MD  Outpatient Primary MD for the patient is [name], MD   LOS - 3  Subjective: Lethargic today.  Review of Systems:   Unable to obtain due to lethargy.  Vitals:   Vitals:   04/10/17 2309 04/11/17 0432 04/11/17 0438 04/11/17 0739  BP:   (!) 141/71 134/65  Pulse:   87 89  Resp:   18 20  Temp:   97.7 F (36.5 C) 97.7 F (36.5 C)  TempSrc:   Oral Oral  SpO2: 98% 97% 100% 100%  Weight:   51.9 kg (114 lb 6.4 oz)   Height:        Wt Readings from Last 3 Encounters:  04/11/17 51.9 kg (114 lb 6.4 oz)  04/05/17 55.2 kg (121 lb 9.6 oz)  03/21/17 54.6 kg (120 lb 4.8 oz)     Intake/Output Summary (Last 24 hours) at 04/11/2017 1314 Last data filed at 04/11/2017 0949 Gross per 24 hour  Intake -  Output 410 ml  Net -410 ml    Physical Exam:   GENERAL: Pleasant-appearing in no apparent distress.  Cachectic.   mal-nourished. HEAD, EYES, EARS, NOSE AND THROAT: Atraumatic, normocephalic. Extraocular muscles are intact. Pupils equal and reactive to light. Sclerae anicteric. No conjunctival injection. No oro-pharyngeal erythema.  NECK: Supple. There is no jugular venous distention. No bruits, no lymphadenopathy, no thyromegaly.  HEART: Regular rate and rhythm,. No murmurs, no rubs, no clicks.  LUNGS:  faint wheezing bilaterally.   ABDOMEN: Soft, flat, nontender, nondistended. Has good bowel sounds. No hepatosplenomegaly appreciated.  EXTREMITIES: No evidence of any cyanosis, clubbing, or peripheral edema.  +2 pedal and radial pulses bilaterally.  NEUROLOGIC: lethargic. SKIN: Moist and warm with no  rashes appreciated.  Psych: comfortable . LN: No inguinal LN enlargement    Antibiotics   Anti-infectives (From admission, onward)   Start     Dose/Rate Route Frequency Ordered Stop   04/09/17 0300  vancomycin (VANCOCIN) IVPB 1000 mg/200 mL premix  Status:  Discontinued     1,000 mg 200 mL/hr over 60 Minutes Intravenous Every 36 hours 04/01/2017 1036 04/09/17 1302   04/05/2017 2200  ceFEPIme (MAXIPIME) 1 g in dextrose 5 % 50 mL IVPB     1 g 100 mL/hr over 30  Minutes Intravenous Every 24 hours 04/10/2017 1036     03/12/2017 1100  azithromycin (ZITHROMAX) tablet 250 mg    Comments:  Daily for 3 days     250 mg Oral Daily 03/12/2017 1026 04/10/17 0906   03/25/2017 0645  ceFEPIme (MAXIPIME) 2 g in dextrose 5 % 50 mL IVPB     2 g 100 mL/hr over 30 Minutes Intravenous  Once 03/17/2017 0632 03/14/2017 0710   03/30/2017 0645  vancomycin (VANCOCIN) IVPB 1000 mg/200 mL premix     1,000 mg 200 mL/hr over 60 Minutes Intravenous  Once 03/29/2017 1610 03/24/2017 0812   04/07/2017 0631  dextrose 5 % with ceFEPIme (MAXIPIME) ADS Med    Comments:  Darlyn Chamber   : cabinet override      03/25/2017 0631 04/06/2017 1844      Medications   Scheduled Meds: . aspirin  81 mg Oral Daily  . budesonide  0.25 mg Nebulization BID  . clopidogrel  75 mg Oral Daily  . docusate sodium  100 mg Oral BID  . enoxaparin (LOVENOX) injection  30 mg Subcutaneous Q24H  . feeding supplement (ENSURE ENLIVE)  237 mL Oral BID BM  . furosemide  20 mg Oral Daily  . hydrocortisone   Topical BID  . ipratropium-albuterol  3 mL Nebulization Q4H  . mouth rinse  15 mL Mouth Rinse BID  . metoprolol tartrate  25 mg Oral BID  . multivitamin with minerals  1 tablet Oral Daily  . oxybutynin  5 mg Oral QHS  . polyethylene glycol  17 g Oral Daily  . predniSONE  50 mg Oral Q breakfast  . psyllium  1 packet Oral Daily  . tiotropium  18 mcg Inhalation QPM   Continuous Infusions: . ceFEPime (MAXIPIME) IV Stopped (04/09/17 2330)   PRN  Meds:.acetaminophen **OR** acetaminophen, albuterol, albuterol, LORazepam, magnesium hydroxide, morphine, nitroGLYCERIN, ondansetron **OR** ondansetron (ZOFRAN) IV   Data Review:   Micro Results Recent Results (from the past 240 hour(s))  Culture, blood (routine x 2) Call MD if unable to obtain prior to antibiotics being given     Status: None   Collection Time: 04/02/17  8:33 PM  Result Value Ref Range Status   Specimen Description BLOOD RIGHT ANTECUBITAL  Final   Special Requests   Final    BOTTLES DRAWN AEROBIC AND ANAEROBIC Blood Culture adequate volume   Culture   Final    NO GROWTH 5 DAYS Performed at [facility], [address], [place], [place] [address]    Report Status 04/07/2017 FINAL  Final  Culture, blood (routine x 2) Call MD if unable to obtain prior to antibiotics being given     Status: None   Collection Time: 04/02/17  8:33 PM  Result Value Ref Range Status   Specimen Description BLOOD LEFT ANTECUBITAL  Final   Special Requests   Final    BOTTLES DRAWN AEROBIC AND ANAEROBIC Blood Culture adequate volume   Culture   Final    NO GROWTH 5 DAYS Performed at [facility], [address] [place], [place] [address]    Report Status 04/07/2017 FINAL  Final  Culture, blood (routine x 2)     Status: None (Preliminary result)   Collection Time: 04/01/2017  5:28 AM  Result Value Ref Range Status   Specimen Description BLOOD LEFT ANTECUBITAL  Final   Special Requests   Final    BOTTLES DRAWN AEROBIC AND ANAEROBIC Blood Culture adequate volume   Culture  Final    NO GROWTH 3 DAYS Performed at [facility], [address] [place], [place] [address]    Report Status PENDING  Incomplete  Culture, blood (routine x 2)     Status: None (Preliminary result)   Collection Time: 03/30/2017  5:35 AM  Result Value Ref Range Status   Specimen Description BLOOD RIGHT ARM  Final   Special Requests   Final    BOTTLES DRAWN AEROBIC AND ANAEROBIC Blood  Culture adequate volume   Culture   Final    NO GROWTH 3 DAYS Performed at [facility], [address] [place], [place] [address]    Report Status PENDING  Incomplete    Radiology Reports Dg Chest Portable 1 View  Result Date: 04/10/2017 CLINICAL DATA:  Acute onset of shortness of breath. EXAM: PORTABLE CHEST 1 VIEW COMPARISON:  Chest radiograph performed 04/02/2017 FINDINGS: The lungs are hyperexpanded, with underlying emphysema. Small bilateral pleural effusions are noted. Mild bibasilar opacities may reflect interstitial edema. There is no evidence of pneumothorax. The cardiomediastinal silhouette is within normal limits. No acute osseous abnormalities are seen. IMPRESSION: 1. Small bilateral pleural effusions. Mild bibasilar opacities may reflect interstitial edema. 2. Lungs hyperexpanded, with underlying emphysema. Electronically Signed   By: Roanna RaiderJeffery  Chang M.D.   On: 03/16/2017 05:44   Dg Chest Portable 1 View  Result Date: 04/02/2017 CLINICAL DATA:  Initial evaluation for acute shortness of breath. EXAM: PORTABLE CHEST 1 VIEW COMPARISON:  None. FINDINGS: Cardiac and mediastinal silhouettes are stable in size and contour, and remain within normal limits. Aortic atherosclerosis. Lungs normally inflated. Underlying COPD. Small layering bilateral pleural effusions. Associated bibasilar opacities likely atelectasis, although infiltrates could be considered as well. No pulmonary edema. No pneumothorax. No acute osseus abnormality. IMPRESSION: 1. Small layering bilateral pleural effusions with associated bibasilar opacities, likely atelectasis. Infiltrates could be considered in the correct clinical setting. 2. Underlying COPD. 3. Aortic atherosclerosis. Electronically Signed   By: Rise MuBenjamin  McClintock M.D.   On: 04/02/2017 15:52   Dg Shoulder Left  Result Date: 04/02/2017 CLINICAL DATA:  Initial evaluation for acute left shoulder pain. No injury. EXAM: LEFT SHOULDER - 2+ VIEW  COMPARISON:  None. FINDINGS: No acute fracture or dislocation. Humeral head in normal line with the glenoid. AC joint approximated. Heterotopic calcification at the superolateral aspect of the humeral head consistent with calcific tendinopathy. Osteoarthritic changes about the [facility] as well. Bones are mildly osteopenic. No soft tissue abnormality. IMPRESSION: 1. No acute osseous abnormality about the left shoulder. 2. Heterotopic calcification at the superolateral aspect of the left femoral head, consistent with underlying calcific tendinopathy. 3. Mild right acromioclavicular osteoarthrosis. Electronically Signed   By: Rise MuBenjamin  McClintock M.D.   On: 04/02/2017 15:50     CBC Recent Labs  Lab 03/20/2017 0524 04/10/17 0437  WBC 19.9* 21.3*  HGB 11.8* 10.0*  HCT 36.0* 30.2*  PLT 191 142*  MCV 98.3 97.3  MCH 32.2 32.3  MCHC 32.8 33.2  RDW 16.4* 16.1*  LYMPHSABS 0.8*  --   MONOABS 0.9  --   EOSABS 0.0  --   BASOSABS 0.1  --     Chemistries  Recent Labs  Lab 03/27/2017 0524 04/09/17 0631  NA 138 141  K 4.3 4.0  CL 100* 103  CO2 31 30  GLUCOSE 132* 136*  BUN 53* 56*  CREATININE 1.08 1.10  CALCIUM 9.8 9.6  AST 43*  --   ALT 46  --   ALKPHOS 51  --  BILITOT 1.6*  --    ------------------------------------------------------------------------------------------------------------------ estimated creatinine clearance is 24.9 mL/min (by C-G formula based on SCr of 1.1 mg/dL). ------------------------------------------------------------------------------------------------------------------ No results for input(s): HGBA1C in the last 72 hours. ------------------------------------------------------------------------------------------------------------------ No results for input(s): CHOL, HDL, LDLCALC, TRIG, CHOLHDL, LDLDIRECT in the last 72 hours. ------------------------------------------------------------------------------------------------------------------ No results for input(s):  TSH, T4TOTAL, T3FREE, THYROIDAB in the last 72 hours.  Invalid input(s): FREET3 ------------------------------------------------------------------------------------------------------------------ No results for input(s): VITAMINB12, FOLATE, FERRITIN, TIBC, IRON, RETICCTPCT in the last 72 hours.  Coagulation profile No results for input(s): INR, PROTIME in the last 168 hours.  No results for input(s): DDIMER in the last 72 hours.  Cardiac Enzymes Recent Labs  Lab 04/30/2017 1031 Apr 30, 2017 1701 04/30/17 2216  TROPONINI 1.42* 1.85* 1.83*   ------------------------------------------------------------------------------------------------------------------ Invalid input(s): POCBNP    Assessment & Plan   AIMPRESSION AND PLAN: Sepsis present on admission secondary to pneumonia: Will use Levaquin, patient pulled IV access, discontinue cefepime.  2 acute on chronic COPD exacerbation: Continue bronchodilators, use prednisone, patient pulled out IV access., patient on oxygen at home 2 L.  Continue oxygen.   High risk for recurrent admissions, course of DNR, patient is high risk for cardiopulmonary arrest recommend palliative care consulted.,  Continue morphine, 48 hours to be monitored closely.  Patient daughter requests nursing home placement.  Physical therapy, social work consulted.  3.  Non-ST elevation MI: No further elevation of troponins, last troponin I 1.83 on 29 December.  Seen by cardiology, added Plavix, nitrates, continue beta-blockers, 40-45%.  Patient has acute on chronic congestive heart failure: .  EKG has some changes of ischemia but unable to get aggressive treatment due to advanced age.  Cardiology did not recommend any invasive cardiac workup at this time.   5  severemalnutrition in the context of chronic illness.  Continue nutritional supplements with Ensure.  Continue dysphagia 3 diet, seen by speech therapy.   6.  Continue current level of care, added morphine and  Ativan by palliative care and also discussed with patient's daughter, if patient declines patient will be evaluated for comfort care sooner.  Family wants to wait and reevaluate in 48 hours.    6.  CODE STATUS confirmed with the patient and family he is a DNR     Code Status Orders  (From admission, onward)        Start     Ordered   04/03/17 1120  Do not attempt resuscitation (DNR)  Continuous    Question Answer Comment  In the event of cardiac or respiratory ARREST Do not call a "code blue"   In the event of cardiac or respiratory ARREST Do not perform Intubation, CPR, defibrillation or ACLS   In the event of cardiac or respiratory ARREST Use medication by any route, position, wound care, and other measures to relive pain and suffering. May use oxygen, suction and manual treatment of airway obstruction as needed for comfort.      04/03/17 1120    Code Status History    Date Active Date Inactive Code Status Order ID Comments User Context   04/02/2017 20:05 04/03/2017 11:20 Full Code [id number]  Bertrum Sol, MD ED   03/21/2017 03:02 03/23/2017 17:35 Full Code [id number]  Bertrum Sol, MD Inpatient   03/04/2017 10:38 03/05/2017 17:53 Full Code [id number]  Adrian Saran, MD Inpatient   03/04/2017 09:59 03/04/2017 10:38 DNR [id number]  Adrian Saran, MD Inpatient   01/20/2017 04:46 01/21/2017 16:22 Full Code [id number]  Tonye Royalty, DO Inpatient   01/02/2017  19:37 01/03/2017 17:59 DNR [id number]  Milagros Loll, MD ED   11/12/2016 09:32 11/13/2016 15:37 Full Code [id number]  Gasper Lloyd, RN Inpatient   11/11/2016 11:23 11/12/2016 09:32 DNR [id number]  Enedina Finner, MD Inpatient   10/18/2016 20:36 10/20/2016 19:52 DNR [id number]  Milagros Loll, MD ED   09/27/2016 15:00 09/28/2016 17:19 DNR [id number]  Altamese Dilling, MD Inpatient   09/24/2016 08:52 09/27/2016 15:00 Full Code [id number]  Enid Baas, MD Inpatient   08/30/2016 05:16 09/02/2016 16:04 Full Code [id number]  Arnaldo Natal, MD Inpatient   02/18/2016 08:27 02/22/2016 18:50 Full Code [id number]  Enedina Finner, MD Inpatient   10/29/2015 21:19 11/02/2015 14:22 Full Code [id number]  Enid Baas, MD Inpatient   10/07/2015 07:32 10/09/2015 16:29 Full Code [id number]  Ihor Austin, MD Inpatient   06/07/2015 00:04 06/10/2015 16:31 Full Code [id number]  Oralia Manis, MD Inpatient   01/11/2015 13:54 01/12/2015 13:58 Full Code [id number]  Alford Highland, MD ED    Advance Directive Documentation     Most Recent Value  Type of Advance Directive  Healthcare Power of Attorney, Living will  Pre-existing out of facility DNR order (yellow form or pink MOST form)  No data  "MOST" Form in Place?  No data           Consults none  DVT Prophylaxis  Lovenox  Lab Results  Component Value Date   PLT 142 (L) 04/10/2017     Time Spent in minutes   30 minutes, Discharge home today.  Greater than 50% of time spent in care coordination and counseling patient regarding the condition and plan of care.   Katha Hamming M.D on 04/11/2017 at 1:14 PM  Between 7am to 6pm - Pager - [phone]  After 6pm go to [email or web address] - password [email or web address] [facility]  [facility]   Office  [phone]

## 2017-04-11 NOTE — Progress Notes (Signed)
OT Cancellation Note  Patient Details Name: Daniel Dickson MRN: [id number] DOB: 07/08/1912   Cancelled Treatment:    Reason Eval/Treat Not Completed: Fatigue/lethargy limiting ability to participate. Order received, chart reviewed. Upon attempt, pt asleep, wakes to OT's voice but cannot keep eyes open without max verbal cues. Will nod head yes/no to simple questions but does not speak to OT. Pt follows simple commands and able to raise BUE and BLE off bed with encouragement but no physical assist required. Grossly 3+/5 BUE strength, fair- grip strength. Pt declined attempts to sit EOB due to fatigue. Pt unable to meaningfully participate in evaluation this date. VSS, 2.5L O2 via [place]. Will re-attempt next date as pt is medically appropriate.   Richrd PrimeJamie Stiller, MPH, MS, OTR/L ascom [phone] 04/11/17, 2:02 PM

## 2017-04-11 NOTE — Progress Notes (Signed)
t pulled out foley. Foley replaced with  # 16 foley. Met no resistance. Pt tolerated procedure well. Some hematuria noted afterwards.

## 2017-04-11 NOTE — Progress Notes (Signed)
SUBJECTIVE: Patient is lethargic   Vitals:   04/10/17 2309 04/11/17 0432 04/11/17 0438 04/11/17 0739  BP:   (!) 141/71 134/65  Pulse:   87 89  Resp:   18 20  Temp:   97.7 F (36.5 C) 97.7 F (36.5 C)  TempSrc:   Oral Oral  SpO2: 98% 97% 100% 100%  Weight:   114 lb 6.4 oz (51.9 kg)   Height:        Intake/Output Summary (Last 24 hours) at 04/11/2017 1140 Last data filed at 04/11/2017 0949 Gross per 24 hour  Intake -  Output 410 ml  Net -410 ml    LABS: Basic Metabolic Panel: Recent Labs    04/09/17 0631  NA 141  K 4.0  CL 103  CO2 30  GLUCOSE 136*  BUN 56*  CREATININE 1.10  CALCIUM 9.6   Liver Function Tests: No results for input(s): AST, ALT, ALKPHOS, BILITOT, PROT, ALBUMIN in the last 72 hours. No results for input(s): LIPASE, AMYLASE in the last 72 hours. CBC: Recent Labs    04/10/17 0437  WBC 21.3*  HGB 10.0*  HCT 30.2*  MCV 97.3  PLT 142*   Cardiac Enzymes: Recent Labs    03/31/2017 1701 03/21/2017 2216  TROPONINI 1.85* 1.83*   BNP: Invalid input(s): POCBNP D-Dimer: No results for input(s): DDIMER in the last 72 hours. Hemoglobin A1C: No results for input(s): HGBA1C in the last 72 hours. Fasting Lipid Panel: No results for input(s): CHOL, HDL, LDLCALC, TRIG, CHOLHDL, LDLDIRECT in the last 72 hours. Thyroid Function Tests: No results for input(s): TSH, T4TOTAL, T3FREE, THYROIDAB in the last 72 hours.  Invalid input(s): FREET3 Anemia Panel: No results for input(s): VITAMINB12, FOLATE, FERRITIN, TIBC, IRON, RETICCTPCT in the last 72 hours.   PHYSICAL EXAM General: Well developed, well nourished, in no acute distress HEENT:  Normocephalic and atramatic Neck:  No JVD.  Lungs: Clear bilaterally to auscultation and percussion. Heart: HRRR . Normal S1 and S2 without gallops or murmurs.  Abdomen: Bowel sounds are positive, abdomen soft and non-tender  Msk:  Back normal, normal gait. Normal strength and tone for age. Extremities: No clubbing,  cyanosis or edema.   Neuro: Alert and oriented X 3. Psych:  Good affect, responds appropriately  TELEMETRY: Sinus rhythm  ASSESSMENT AND PLAN: Status post non-STEMI being treated medically with left ventricular ejection fraction is 60%. Continue medical therapy.  Active Problems:   Acute on chronic respiratory failure with hypoxemia (HCC)   Pressure injury of skin    Meckenzie Balsley A, MD, [facility] 04/11/2017 11:40 AM

## 2017-04-11 NOTE — Progress Notes (Signed)
PT Cancellation Note  Patient Details Name: Daniel Dickson MRN: [id number] DOB: 05/24/1912   Cancelled Treatment:    Reason Eval/Treat Not Completed: Medical issues which prohibited therapy, Troponin is increasing.    [address], Kristine S, [place]PT DPT 04/11/2017, 12:16 PM

## 2017-04-11 DEATH — deceased

## 2017-04-12 DIAGNOSIS — R0902 Hypoxemia: Secondary | ICD-10-CM

## 2017-04-12 DIAGNOSIS — J9621 Acute and chronic respiratory failure with hypoxia: Secondary | ICD-10-CM

## 2017-04-12 DIAGNOSIS — Z515 Encounter for palliative care: Secondary | ICD-10-CM

## 2017-04-12 DIAGNOSIS — R0603 Acute respiratory distress: Secondary | ICD-10-CM

## 2017-04-12 DIAGNOSIS — Z7189 Other specified counseling: Secondary | ICD-10-CM

## 2017-04-12 MED ORDER — MORPHINE SULFATE (CONCENTRATE) 10 MG/0.5ML PO SOLN
2.5000 mg | ORAL | Status: DC | PRN
  Administered 2017-04-13 (×3): 2.6 mg via ORAL
  Filled 2017-04-12 (×3): qty 1

## 2017-04-12 MED ORDER — GLYCOPYRROLATE 1 MG PO TABS
1.0000 mg | ORAL_TABLET | Freq: Three times a day (TID) | ORAL | Status: DC | PRN
  Filled 2017-04-12 (×2): qty 1

## 2017-04-12 MED ORDER — HYDROCORTISONE 2.5 % RE CREA
TOPICAL_CREAM | Freq: Two times a day (BID) | RECTAL | Status: DC | PRN
  Filled 2017-04-12: qty 28.35

## 2017-04-12 MED ORDER — MORPHINE SULFATE 10 MG/5ML PO SOLN: 2.5000 mg | ORAL | Status: DC | PRN

## 2017-04-12 NOTE — Clinical Social Work Note (Signed)
CSW received consult for patient needing hospice placement, case manager was informed by family they would like [facility].  Nurse liaison of [facility] was made aware there are currently no beds available.  CSW to facilitate discharge planning for Hospice Home once bed is available.  Ervin KnackEric R. Sanaai Doane, MSW, Theresia MajorsLCSWA [phone]  04/12/2017 12:45 PM

## 2017-04-12 NOTE — Progress Notes (Addendum)
Daily Progress Note   Patient Name: Daniel Dickson       Date: 04/12/2017 DOB: 1912-10-04  Age: 84 y.o. MRN#: [id number] Attending Physician: Shaune Pollack, MD Primary Care Physician: [name], MD Admit Date: 04/15/17  Reason for Consultation/Follow-up: Terminal Care  Subjective: Daniel Dickson is resting in bedside chair. He opens his eyes  and moans with exhalation. Unable to understand his speech. He refuses food and drink while I am present when daughter tries to feed him. Per nursing, he removed his IV and his foley. The foley was replaced, but IV was not.   We discussed diagnosis, prognosis, GOC, EOL wishes disposition and options.  A detailed discussion was had today regarding advanced directives.  Concepts specific to code status, artifical feeding and hydration, IV antibiotics and rehospitalization was discussed.  The difference between an aggressive medical intervention path and a hospice comfort care path for this patient at this time was discussed.  Values and goals of care important to patient and family were attempted to be elicited. Natural trajectory and expectations at EOL were discussed.      Cardiology in to see patient. We discussed his diagnoses and overall prognosis and medications. She states she only wants medications for his comfort, and would like comfort care. Per daughter, after I left he drank a few sips of water and a couple bites of apple sauce. Per nursing, he takes PO meds.   MOST form completed with comfort care measures and no abx   Length of Stay: 4  Current Medications: Scheduled Meds:  . budesonide  0.25 mg Nebulization BID  . docusate sodium  100 mg Oral BID  . ipratropium-albuterol  3 mL Nebulization Q4H  . mouth rinse  15 mL Mouth Rinse BID  .  metoprolol tartrate  25 mg Oral BID  . oxybutynin  5 mg Oral QHS  . polyethylene glycol  17 g Oral Daily  . predniSONE  50 mg Oral Q breakfast  . psyllium  1 packet Oral Daily  . tiotropium  18 mcg Inhalation QPM    Continuous Infusions:   PRN Meds: acetaminophen **OR** acetaminophen, albuterol, glycopyrrolate, hydrocortisone, LORazepam, magnesium hydroxide, morphine, nitroGLYCERIN, ondansetron **OR** ondansetron (ZOFRAN) IV  Physical Exam  Constitutional: No distress.  Pulmonary/Chest: Effort normal.  Skin:  No edema  Vital Signs: BP (!) 151/83 (BP Location: Left Arm)   Pulse (!) 102   Temp 97.7 F (36.5 C) (Oral)   Resp 18   Ht 5\' 7"  (1.702 m)   Wt 50.3 kg (110 lb 12.8 oz)   SpO2 (!) 88%   BMI 17.35 kg/m  SpO2: SpO2: (!) 88 % O2 Device: O2 Device: Nasal Cannula O2 Flow Rate: O2 Flow Rate (L/min): 2 L/min  Intake/output summary:   Intake/Output Summary (Last 24 hours) at 04/12/2017 1104 Last data filed at 04/12/2017 0932 Gross per 24 hour  Intake 0 ml  Output 800 ml  Net -800 ml   LBM: Last BM Date: 04/10/17 Baseline Weight: Weight: 56.7 kg (125 lb) Most recent weight: Weight: 50.3 kg (110 lb 12.8 oz)       Palliative Assessment/Data: 20% at best    Flowsheet Rows     Most Recent Value  Intake Tab  Referral Department  Hospitalist  Unit at Time of Referral  Med/Surg Unit  Palliative Care Primary Diagnosis  Cardiac  Date Notified  04/10/18  Palliative Care Type  Return patient Palliative Care  Reason for referral  Clarify Goals of Care  Date of Admission  04/08/18  Date first seen by Palliative Care  04/10/18  # of days Palliative referral response time  0 Day(s)  # of days IP prior to Palliative referral  2  Clinical Assessment  Psychosocial & Spiritual Assessment  Palliative Care Outcomes      Patient Active Problem List   Diagnosis Date Noted  . Pressure injury of skin 04/11/2017  . Acute on chronic respiratory failure with hypoxemia  (HCC) 05-12-2016  . CAP (community acquired pneumonia) 04/02/2017  . Elevated troponin 03/21/2017  . Hematuria 03/21/2017  . Demand ischemia (HCC)   . GIB (gastrointestinal bleeding) 03/04/2017  . Shortness of breath 01/20/2017  . CHF (congestive heart failure) (HCC) 01/02/2017  . Acute on chronic respiratory failure (HCC) 11/11/2016  . Open wound of lower limb with tendon involvement, right, subsequent encounter   . Cellulitis of right leg 10/18/2016  . DNR (do not resuscitate) discussion   . Palliative care by specialist   . Adult failure to thrive   . Gross hematuria   . Acute respiratory failure (HCC) 02/18/2016  . Urinary retention 11/10/2015  . Hyperkalemia 11/02/2015  . Hypotension 11/02/2015  . Dysphagia, pharyngoesophageal phase 11/02/2015  . Esophageal spasm 11/02/2015  . Constipation 11/02/2015  . SIADH (syndrome of inappropriate ADH production) (HCC) 11/02/2015  . Urinary retention due to benign prostatic hyperplasia 11/02/2015  . Chronic indwelling Foley catheter 11/02/2015  . Urinary tract infection with hematuria 11/02/2015  . Protein-calorie malnutrition, severe 10/30/2015  . ARF (acute renal failure) (HCC) 10/29/2015  . Dyspnea 10/07/2015  . Hyponatremia 06/06/2015  . Coronary artery disease   . Hypertensive heart disease   . Mixed hyperlipidemia   . COPD (chronic obstructive pulmonary disease) (HCC)   . COPD exacerbation (HCC) 01/11/2015  . Moderate COPD (chronic obstructive pulmonary disease) (HCC) 12/08/2014  . Fall 09/03/2014  . Hip fracture (HCC) 09/03/2014  . PTSD (post-traumatic stress disorder) 09/03/2014  . Bilateral leg edema 09/03/2014  . SOB (shortness of breath) 10/31/2012  . Essential hypertension 10/31/2012  . CAD (coronary artery disease) 10/31/2012  . Hyperlipidemia 10/31/2012  . Tachycardia 10/31/2012    Palliative Care Assessment & Plan   Patient Profile: The patient with past medical history of coronary artery disease status post  PCI, COPD, ischemic cardiomyopathy and hypertension presents emergency  department with shortness of breath. The patient was just discharged from the hospital 3 days ago after treatment of community-acquired pneumonia.    Assessment: Daniel Dickson appears to have declined since last visit.   Recommendations/Plan:  Inpatient Hospice with comfort care.  PO medications as able, as patient will not allow maintenance of IV and still takes PO medications without issue per nursing.   Morphine for pain and dyspnea.  Ativan for anxiety.   Robinol for increased secretions.   Foley for retention   Goals of Care and Additional Recommendations:  Limitations on Scope of Treatment: Full Comfort Care  Code Status:    Code Status Orders  (From admission, onward)        Start     Ordered   04-10-17 1027  Do not attempt resuscitation (DNR)  Continuous    Question Answer Comment  In the event of cardiac or respiratory ARREST Do not call a "code blue"   In the event of cardiac or respiratory ARREST Do not perform Intubation, CPR, defibrillation or ACLS   In the event of cardiac or respiratory ARREST Use medication by any route, position, wound care, and other measures to relive pain and suffering. May use oxygen, suction and manual treatment of airway obstruction as needed for comfort.      2017/04/10 1026    Code Status History    Date Active Date Inactive Code Status Order ID Comments User Context   04/03/2017 11:20 04/05/2017 16:47 DNR [id number]  Auburn Bilberry, MD Inpatient   04/02/2017 20:05 04/03/2017 11:20 Full Code [id number]  Bertrum Sol, MD ED   03/21/2017 03:02 03/23/2017 17:35 Full Code [id number]  Bertrum Sol, MD Inpatient   03/04/2017 10:38 03/05/2017 17:53 Full Code [id number]  Adrian Saran, MD Inpatient   03/04/2017 09:59 03/04/2017 10:38 DNR [id number]  Adrian Saran, MD Inpatient   01/20/2017 04:46 01/21/2017 16:22 Full Code [id number]  Tonye Royalty, DO Inpatient     01/02/2017 19:37 01/03/2017 17:59 DNR [id number]  Milagros Loll, MD ED   11/12/2016 09:32 11/13/2016 15:37 Full Code [id number]  Gasper Lloyd, RN Inpatient   11/11/2016 11:23 11/12/2016 09:32 DNR [id number]  Enedina Finner, MD Inpatient   10/18/2016 20:36 10/20/2016 19:52 DNR [id number]  Milagros Loll, MD ED   09/27/2016 15:00 09/28/2016 17:19 DNR [id number]  Altamese Dilling, MD Inpatient   09/24/2016 08:52 09/27/2016 15:00 Full Code [id number]  Enid Baas, MD Inpatient   08/30/2016 05:16 09/02/2016 16:04 Full Code [id number]  Arnaldo Natal, MD Inpatient   02/18/2016 08:27 02/22/2016 18:50 Full Code [id number]  Enedina Finner, MD Inpatient   10/29/2015 21:19 11/02/2015 14:22 Full Code [id number]  Enid Baas, MD Inpatient   10/07/2015 07:32 10/09/2015 16:29 Full Code [id number]  Ihor Austin, MD Inpatient   06/07/2015 00:04 06/10/2015 16:31 Full Code [id number]  Oralia Manis, MD Inpatient   01/11/2015 13:54 01/12/2015 13:58 Full Code [id number]  Alford Highland, MD ED    Advance Directive Documentation     Most Recent Value  Type of Advance Directive  Healthcare Power of Attorney, Living will  Pre-existing out of facility DNR order (yellow form or pink MOST form)  No data  "MOST" Form in Place?  No data       Prognosis:   < 2 weeks Admitted for NSTEMI. Has COPD, discharged 3 days prior to this admission for PNA. Now with decreased oral intake and decreased mental status.   Discharge Planning:  Hospice facility  Care plan was discussed with Primary RN and  cardiology. Text page sent to [facility].   Thank you for allowing the Palliative Medicine Team to assist in the care of this patient.   Total Time 10:10-11:25 1 hour 15 min  Prolonged Time Billed yes      Greater than 50%  of this time was spent counseling and coordinating care related to the above assessment and plan.  Morton Stall, NP  Please contact Palliative Medicine Team phone at [phone] for questions and concerns.

## 2017-04-12 NOTE — Progress Notes (Signed)
Pt requested his daughter be contacted and asked for her to come to the hospital. Daughter called. No answer, message left.

## 2017-04-12 NOTE — Progress Notes (Signed)
SUBJECTIVE: Patient appears to be comfortable   Vitals:   04/11/17 2320 04/12/17 0334 04/12/17 0437 04/12/17 0732  BP:   (!) 154/93 (!) 151/83  Pulse:   98 (!) 102  Resp:   18   Temp:   97.6 F (36.4 C) 97.7 F (36.5 C)  TempSrc:   Oral Oral  SpO2: 97% 97% 94% (!) 88%  Weight:   110 lb 12.8 oz (50.3 kg)   Height:        Intake/Output Summary (Last 24 hours) at 04/12/2017 1104 Last data filed at 04/12/2017 0932 Gross per 24 hour  Intake 0 ml  Output 800 ml  Net -800 ml    LABS: Basic Metabolic Panel: No results for input(s): NA, K, CL, CO2, GLUCOSE, BUN, CREATININE, CALCIUM, MG, PHOS in the last 72 hours. Liver Function Tests: No results for input(s): AST, ALT, ALKPHOS, BILITOT, PROT, ALBUMIN in the last 72 hours. No results for input(s): LIPASE, AMYLASE in the last 72 hours. CBC: Recent Labs    04/10/17 0437  WBC 21.3*  HGB 10.0*  HCT 30.2*  MCV 97.3  PLT 142*   Cardiac Enzymes: No results for input(s): CKTOTAL, CKMB, CKMBINDEX, TROPONINI in the last 72 hours. BNP: Invalid input(s): POCBNP D-Dimer: No results for input(s): DDIMER in the last 72 hours. Hemoglobin A1C: No results for input(s): HGBA1C in the last 72 hours. Fasting Lipid Panel: No results for input(s): CHOL, HDL, LDLCALC, TRIG, CHOLHDL, LDLDIRECT in the last 72 hours. Thyroid Function Tests: No results for input(s): TSH, T4TOTAL, T3FREE, THYROIDAB in the last 72 hours.  Invalid input(s): FREET3 Anemia Panel: No results for input(s): VITAMINB12, FOLATE, FERRITIN, TIBC, IRON, RETICCTPCT in the last 72 hours.   PHYSICAL EXAM General: Well developed, well nourished, in no acute distress HEENT:  Normocephalic and atramatic Neck:  No JVD.  Lungs: Clear bilaterally to auscultation and percussion. Heart: HRRR . Normal S1 and S2 without gallops or murmurs.  Abdomen: Bowel sounds are positive, abdomen soft and non-tender  Msk:  Back normal, normal gait. Normal strength and tone for age. Extremities:  No clubbing, cyanosis or edema.   Neuro: Alert and oriented X 3. Psych:  Good affect, responds appropriately  TELEMETRY: Sinus rhythm  ASSESSMENT AND PLAN: Status post non-STEMI and respiratory failure/hypoxemia. Agree with stopping Plavix and is now hospice care with palliative care evaluating the patient for arrangement for discharge.  Active Problems:   Acute on chronic respiratory failure with hypoxemia (HCC)   Pressure injury of skin    Nthony Lefferts A, MD, [facility] CenterFACC 04/12/2017 11:04 AM

## 2017-04-12 NOTE — Care Management (Signed)
Informed by palliative that patient will be transitioned to comfort care and is appropriate for inpatient hospice facility. Facility preference is [facility].  Notified liaison about referral.

## 2017-04-12 NOTE — Progress Notes (Signed)
PT Cancellation Note  Patient Details Name: Daniel Dickson MRN: [id number] DOB: 07/20/1912   Cancelled Treatment:    Reason Eval/Treat Not Completed: Other (comment).  MD has discontinued PT order as pt transitioning to Palliative care.  PT will sign off.   Encarnacion ChuAshley Waynette Towers PT, DPT 04/12/2017, 11:16 AM

## 2017-04-12 NOTE — Progress Notes (Signed)
[employer] [facility] - [place] [facility]                                                                                                                                                                                  Patient Demographics   Daniel Dickson, is a 84 y.o. male, DOB - 06/17/1912, ONG:[id number]  Admit date - 03/12/2017   Admitting Physician Arnaldo Natal, MD  Outpatient Primary MD for the patient is [name], MD   LOS - 4  Subjective:  The patient is demented and confused.  Review of Systems:   Unable to obtain due to confusion.  Vitals:   Vitals:   04/12/17 0334 04/12/17 0437 04/12/17 0732 04/12/17 1308  BP:  (!) 154/93 (!) 151/83   Pulse:  98 (!) 102   Resp:  18    Temp:  97.6 F (36.4 C) 97.7 F (36.5 C)   TempSrc:  Oral Oral   SpO2: 97% 94% (!) 88% (!) 80%  Weight:  110 lb 12.8 oz (50.3 kg)    Height:        Wt Readings from Last 3 Encounters:  04/12/17 110 lb 12.8 oz (50.3 kg)  04/05/17 121 lb 9.6 oz (55.2 kg)  03/21/17 120 lb 4.8 oz (54.6 kg)     Intake/Output Summary (Last 24 hours) at 04/12/2017 1405 Last data filed at 04/12/2017 0932 Gross per 24 hour  Intake 0 ml  Output 800 ml  Net -800 ml    Physical Exam:   GENERAL: Pleasant-appearing in no apparent distress.  Cachectic.   mal-nourished. HEAD, EYES, EARS, NOSE AND THROAT: Atraumatic, normocephalic. Extraocular muscles are intact. Pupils equal and reactive to light. Sclerae anicteric. No conjunctival injection. No oro-pharyngeal erythema.  NECK: Supple. There is no jugular venous distention. No bruits, no lymphadenopathy, no thyromegaly.  HEART: Regular rate and rhythm,. No murmurs, no rubs, no clicks.  LUNGS:  faint wheezing bilaterally.   ABDOMEN: Soft, flat, nontender, nondistended. Has good bowel sounds. No hepatosplenomegaly appreciated.  EXTREMITIES: No evidence of any cyanosis, clubbing, or peripheral edema.  +2 pedal and radial pulses bilaterally.  NEUROLOGIC:  lethargic. SKIN: Moist and warm with no rashes appreciated.  Psych: Confused. Marland Kitchen LN: No inguinal LN enlargement    Antibiotics   Anti-infectives (From admission, onward)   Start     Dose/Rate Route Frequency Ordered Stop   04/11/17 1430  levofloxacin (LEVAQUIN) tablet 750 mg  Status:  Discontinued     750 mg Oral Every other day 04/11/17 1320 04/12/17 1055   04/09/17 0300  vancomycin (VANCOCIN) IVPB 1000 mg/200 mL premix  Status:  Discontinued     1,000 mg 200  mL/hr over 60 Minutes Intravenous Every 36 hours 03/13/2017 1036 04/09/17 1302   03/21/2017 2200  ceFEPIme (MAXIPIME) 1 g in dextrose 5 % 50 mL IVPB  Status:  Discontinued     1 g 100 mL/hr over 30 Minutes Intravenous Every 24 hours 04/09/2017 1036 04/11/17 1318   03/23/2017 1100  azithromycin (ZITHROMAX) tablet 250 mg    Comments:  Daily for 3 days     250 mg Oral Daily 03/24/2017 1026 04/10/17 0906   04/01/2017 0645  ceFEPIme (MAXIPIME) 2 g in dextrose 5 % 50 mL IVPB     2 g 100 mL/hr over 30 Minutes Intravenous  Once 03/24/2017 0632 03/13/2017 0710   04/09/2017 0645  vancomycin (VANCOCIN) IVPB 1000 mg/200 mL premix     1,000 mg 200 mL/hr over 60 Minutes Intravenous  Once 03/28/2017 [id number] 04/02/2017 0812   03/31/2017 0631  dextrose 5 % with ceFEPIme (MAXIPIME) ADS Med    Comments:  Darlyn Chamberawlins, Alecia   : cabinet override      03/27/2017 0631 03/14/2017 1844      Medications   Scheduled Meds: . budesonide  0.25 mg Nebulization BID  . docusate sodium  100 mg Oral BID  . ipratropium-albuterol  3 mL Nebulization Q4H  . mouth rinse  15 mL Mouth Rinse BID  . metoprolol tartrate  25 mg Oral BID  . oxybutynin  5 mg Oral QHS  . polyethylene glycol  17 g Oral Daily  . predniSONE  50 mg Oral Q breakfast  . psyllium  1 packet Oral Daily  . tiotropium  18 mcg Inhalation QPM   Continuous Infusions:  PRN Meds:.acetaminophen **OR** acetaminophen, albuterol, glycopyrrolate, hydrocortisone, LORazepam, magnesium hydroxide, morphine, nitroGLYCERIN, ondansetron  **OR** ondansetron (ZOFRAN) IV   Data Review:   Micro Results Recent Results (from the past 240 hour(s))  Culture, blood (routine x 2) Call MD if unable to obtain prior to antibiotics being given     Status: None   Collection Time: 04/02/17  8:33 PM  Result Value Ref Range Status   Specimen Description BLOOD RIGHT ANTECUBITAL  Final   Special Requests   Final    BOTTLES DRAWN AEROBIC AND ANAEROBIC Blood Culture adequate volume   Culture   Final    NO GROWTH 5 DAYS Performed at [facility], [address] [place], [place] [address]    Report Status 04/07/2017 FINAL  Final  Culture, blood (routine x 2) Call MD if unable to obtain prior to antibiotics being given     Status: None   Collection Time: 04/02/17  8:33 PM  Result Value Ref Range Status   Specimen Description BLOOD LEFT ANTECUBITAL  Final   Special Requests   Final    BOTTLES DRAWN AEROBIC AND ANAEROBIC Blood Culture adequate volume   Culture   Final    NO GROWTH 5 DAYS Performed at [facility], [address] [place], [place] [address]    Report Status 04/07/2017 FINAL  Final  Culture, blood (routine x 2)     Status: None (Preliminary result)   Collection Time: 03/18/2017  5:28 AM  Result Value Ref Range Status   Specimen Description BLOOD LEFT ANTECUBITAL  Final   Special Requests   Final    BOTTLES DRAWN AEROBIC AND ANAEROBIC Blood Culture adequate volume   Culture   Final    NO GROWTH 4 DAYS Performed at [facility], [address] [place], [place] [address]    Report Status PENDING  Incomplete  Culture, blood (routine x 2)     Status: None (Preliminary result)   Collection Time: 04/04/2017  5:35 AM  Result Value Ref Range Status   Specimen Description BLOOD RIGHT ARM  Final   Special Requests   Final    BOTTLES DRAWN AEROBIC AND ANAEROBIC Blood Culture adequate volume   Culture   Final    NO GROWTH 4 DAYS Performed at [facility], [address], [place],  [place] [address]    Report Status PENDING  Incomplete    Radiology Reports Dg Chest Portable 1 View  Result Date: 03/14/2017 CLINICAL DATA:  Acute onset of shortness of breath. EXAM: PORTABLE CHEST 1 VIEW COMPARISON:  Chest radiograph performed 04/02/2017 FINDINGS: The lungs are hyperexpanded, with underlying emphysema. Small bilateral pleural effusions are noted. Mild bibasilar opacities may reflect interstitial edema. There is no evidence of pneumothorax. The cardiomediastinal silhouette is within normal limits. No acute osseous abnormalities are seen. IMPRESSION: 1. Small bilateral pleural effusions. Mild bibasilar opacities may reflect interstitial edema. 2. Lungs hyperexpanded, with underlying emphysema. Electronically Signed   By: Roanna Raider M.D.   On: 03/13/2017 05:44   Dg Chest Portable 1 View  Result Date: 04/02/2017 CLINICAL DATA:  Initial evaluation for acute shortness of breath. EXAM: PORTABLE CHEST 1 VIEW COMPARISON:  None. FINDINGS: Cardiac and mediastinal silhouettes are stable in size and contour, and remain within normal limits. Aortic atherosclerosis. Lungs normally inflated. Underlying COPD. Small layering bilateral pleural effusions. Associated bibasilar opacities likely atelectasis, although infiltrates could be considered as well. No pulmonary edema. No pneumothorax. No acute osseus abnormality. IMPRESSION: 1. Small layering bilateral pleural effusions with associated bibasilar opacities, likely atelectasis. Infiltrates could be considered in the correct clinical setting. 2. Underlying COPD. 3. Aortic atherosclerosis. Electronically Signed   By: Rise Mu M.D.   On: 04/02/2017 15:52   Dg Shoulder Left  Result Date: 04/02/2017 CLINICAL DATA:  Initial evaluation for acute left shoulder pain. No injury. EXAM: LEFT SHOULDER - 2+ VIEW COMPARISON:  None. FINDINGS: No acute fracture or dislocation. Humeral head in normal line with the glenoid. AC joint approximated.  Heterotopic calcification at the superolateral aspect of the humeral head consistent with calcific tendinopathy. Osteoarthritic changes about the [facility] joint as well. Bones are mildly osteopenic. No soft tissue abnormality. IMPRESSION: 1. No acute osseous abnormality about the left shoulder. 2. Heterotopic calcification at the superolateral aspect of the left femoral head, consistent with underlying calcific tendinopathy. 3. Mild right acromioclavicular osteoarthrosis. Electronically Signed   By: Rise Mu M.D.   On: 04/02/2017 15:50     CBC Recent Labs  Lab 03/15/2017 0524 04/10/17 0437  WBC 19.9* 21.3*  HGB 11.8* 10.0*  HCT 36.0* 30.2*  PLT 191 142*  MCV 98.3 97.3  MCH 32.2 32.3  MCHC 32.8 33.2  RDW 16.4* 16.1*  LYMPHSABS 0.8*  --   MONOABS 0.9  --   EOSABS 0.0  --   BASOSABS 0.1  --     Chemistries  Recent Labs  Lab 03/14/2017 0524 04/09/17 0631  NA 138 141  K 4.3 4.0  CL 100* 103  CO2 31 30  GLUCOSE 132* 136*  BUN 53* 56*  CREATININE 1.08 1.10  CALCIUM 9.8 9.6  AST 43*  --   ALT 46  --   ALKPHOS 51  --   BILITOT 1.6*  --    ------------------------------------------------------------------------------------------------------------------ estimated creatinine clearance is 24.1 mL/min (by C-G formula based on SCr of 1.1 mg/dL). ------------------------------------------------------------------------------------------------------------------ No results for  input(s): HGBA1C in the last 72 hours. ------------------------------------------------------------------------------------------------------------------ No results for input(s): CHOL, HDL, LDLCALC, TRIG, CHOLHDL, LDLDIRECT in the last 72 hours. ------------------------------------------------------------------------------------------------------------------ No results for input(s): TSH, T4TOTAL, T3FREE, THYROIDAB in the last 72 hours.  Invalid input(s):  FREET3 ------------------------------------------------------------------------------------------------------------------ No results for input(s): VITAMINB12, FOLATE, FERRITIN, TIBC, IRON, RETICCTPCT in the last 72 hours.  Coagulation profile No results for input(s): INR, PROTIME in the last 168 hours.  No results for input(s): DDIMER in the last 72 hours.  Cardiac Enzymes Recent Labs  Lab 04/05/2017 1031 03/31/2017 1701 03/29/2017 2216  TROPONINI 1.42* 1.85* 1.83*   ------------------------------------------------------------------------------------------------------------------ Invalid input(s): POCBNP    Assessment & Plan   AIMPRESSION AND PLAN: Sepsis present on admission secondary to pneumonia: on Levaquin, patient pulled IV access, discontinued cefepime.  2 acute on chronic COPD exacerbation: Continue bronchodilators, use prednisone, patient pulled out IV access., patient on oxygen at home 2 L.  Continue oxygen.   High risk for recurrent admissions, course of DNR, patient is high risk for cardiopulmonary arrest recommend palliative care consulted.,  Continue morphine. Changed to comfort care this am.  3.  Non-ST elevation MI: No further elevation of troponins, last troponin I 1.83 on 29 December.  Seen by cardiology, added Plavix, nitrates, continue beta-blockers, 40-45%.  Patient has acute on chronic congestive heart failure: .  EKG has some changes of ischemia but unable to get aggressive treatment due to advanced age.  Cardiology did not recommend any invasive cardiac workup at this time.  The patient is not a candidate for cardiac rehab due to clinical condition and very poor prognosis.  He is on comfort care.   5  severemalnutrition in the context of chronic illness.  Continue nutritional supplements with Ensure.  Continue dysphagia 3 diet, seen by speech therapy.     Code Status Orders  (From admission, onward)        Start     Ordered   04/03/17 1120  Do not  attempt resuscitation (DNR)  Continuous    Question Answer Comment  In the event of cardiac or respiratory ARREST Do not call a "code blue"   In the event of cardiac or respiratory ARREST Do not perform Intubation, CPR, defibrillation or ACLS   In the event of cardiac or respiratory ARREST Use medication by any route, position, wound care, and other measures to relive pain and suffering. May use oxygen, suction and manual treatment of airway obstruction as needed for comfort.      04/03/17 1120    Code Status History    Date Active Date Inactive Code Status Order ID Comments User Context   04/02/2017 20:05 04/03/2017 11:20 Full Code [id number]  Bertrum Sol, MD ED   03/21/2017 03:02 03/23/2017 17:35 Full Code [id number]  Bertrum Sol, MD Inpatient   03/04/2017 10:38 03/05/2017 17:53 Full Code [id number]  Adrian Saran, MD Inpatient   03/04/2017 09:59 03/04/2017 10:38 DNR [id number]  Adrian Saran, MD Inpatient   01/20/2017 04:46 01/21/2017 16:22 Full Code [id number]  Tonye Royalty, DO Inpatient   01/02/2017 19:37 01/03/2017 17:59 DNR [id number]  Milagros Loll, MD ED   11/12/2016 09:32 11/13/2016 15:37 Full Code [id number]  Gasper Lloyd, RN Inpatient   11/11/2016 11:23 11/12/2016 09:32 DNR [id number]  Enedina Finner, MD Inpatient   10/18/2016 20:36 10/20/2016 19:52 DNR [id number]  Milagros Loll, MD ED   09/27/2016 15:00 09/28/2016 17:19 DNR [id number]  Altamese Dilling, MD Inpatient   09/24/2016 08:52 09/27/2016 15:00 Full Code [id number]  Enid Baas, MD  Inpatient   08/30/2016 05:16 09/02/2016 16:04 Full Code [id number]  Arnaldo Natal, MD Inpatient   02/18/2016 08:27 02/22/2016 18:50 Full Code [id number]  Enedina Finner, MD Inpatient   10/29/2015 21:19 11/02/2015 14:22 Full Code [id number]  Enid Baas, MD Inpatient   10/07/2015 07:32 10/09/2015 16:29 Full Code [id number]  Ihor Austin, MD Inpatient   06/07/2015 00:04 06/10/2015 16:31 Full Code [id number]  Oralia Manis, MD Inpatient    01/11/2015 13:54 01/12/2015 13:58 Full Code [id number]  Alford Highland, MD ED    Advance Directive Documentation     Most Recent Value  Type of Advance Directive  Healthcare Power of Attorney, Living will  Pre-existing out of facility DNR order (yellow form or pink MOST form)  No data  "MOST" Form in Place?  No data           Consults none  DVT Prophylaxis  Lovenox  Lab Results  Component Value Date   PLT 142 (L) 04/10/2017     Time Spent in minutes   28 minutes, Discharge home today.  Greater than 50% of time spent in care coordination and counseling patient regarding the condition and plan of care.   Shaune Pollack M.D on 04/12/2017 at 2:05 PM  Between 7am to 6pm - Pager - [phone]  After 6pm go to [email or web address] - password [email or web address] [facility]  [facility]   Office  [phone]

## 2017-04-12 NOTE — Progress Notes (Signed)
[facility] Greene following a Palliative medicine follow up visit. Hospital care team made aware there is currently no bed available. Patient is a 84 year old man with a known history of coronary artery disease status post PCI, COPD, ischemic cardiomyopathy and hypertension who [facility]  with shortness of breath. He has had 8 hospital admissions in the last 6 months, most recently discharged 4 days ago following treatment for community acquired PNA. This admission he required BIPAP, now transitioned to nasal cannula. Patient has continued to decline despite medical treatment and family has chosen to focus on his comfort. patient seen lying in bed, audible secretions noted, patient appeared to be sleeping, but did not awaken to voice or gentle touch. Staff RN Brett CanalesSteve made aware of need for secretions management, PRN robinul to be given. Writer contacted patient's daughter Chase Picketatricia Armstrong via telephone [phone]) to make her aware that there was currently no bed available. She voiced her understanding. Writer to contact her, if patient remains stable, when a bed becomes available. Hospital care team updated. Patient information faxed to referral. Will continue to follow through final disposition. Dayna BarkerKaren Robertson RN, BSN, [facility] [place], hospital Liaison [phone]

## 2017-04-12 NOTE — Evaluation (Signed)
Occupational Therapy Evaluation Patient Details Name: Daniel Dickson MRN: [id number] DOB: 12/24/1912 Today's Date: 04/12/2017    History of Present Illness Pt is a [age] y.o. male admitted with acute on chronic respiratory failure with hypoxemia. PMH includes COPD, CAD, HTN, PTSD, CHF, hip surgery, chronic indwelling foley.    Clinical Impression   Pt seen for OT evaluation this date. Per daughter, over past 6 months, pt has declined in terms of mobility, primarily sits in a recliner where he also sleeps. Has HHA during he day for bathing, dressing, cleaning, and meals. Daughter goes back and forth in the evening to assist pt as needed. Pt alert, requiring verbal cues to attend as very distracted by attempting to take mitts off his hands as he indicated his skin was itchy. Attempted to get vitals, but pt wouldn't keep pulse ox on long enough to confirm an accurate reading (briefly read HR 115, O2 sats 88% on 2L O2). Pt requesting his son be present, RN notified. Daughter entered room near beginning of session for upcoming meeting with palliative care NP. Pt agreeable with encouragement to sit up in recliner. Required mod assist x1 (nursing present for additional assist as needed) for bed mobility and stand pivot transfer to recliner with VC for hand placement on bed rail to assist. Pt indicated he felt better sitting up. With encouragement from daughter, pt willing to try to eat some oatmeal and coffee. Confirmed with dining this was appropriate for dysphasia 3 diet. Encouraged daughter to allow pt to self feed as much as he would like. Palliative care NP in room at end of session. Pt will benefit from skilled OT services with focus on maintaining maximal functional independence, preventing functional decline, and assessing home set up to support maximal accessibility and safety. Recommend HHOT services, pending pt/family's goals of care following palliative care meeting.     Follow Up Recommendations  Supervision/Assistance - 24 hour;Home health OT(pending pt/family goals of care)    Equipment Recommendations  None recommended by OT    Recommendations for Other Services       Precautions / Restrictions Precautions Precautions: Fall Restrictions Weight Bearing Restrictions: No      Mobility Bed Mobility Overal bed mobility: Needs Assistance Bed Mobility: Supine to Sit     Supine to sit: Mod assist;HOB elevated     General bed mobility comments: VC for hand placement on bed rails to assist, mod assist for BLE mgt and trunk elevation  Transfers Overall transfer level: Needs assistance Equipment used: None(dtr stated RW would be more in the way than helpful) Transfers: Sit to/from UGI CorporationStand;Stand Pivot Transfers Sit to Stand: Mod assist Stand pivot transfers: Mod assist            Balance Overall balance assessment: Needs assistance Sitting-balance support: Bilateral upper extremity supported;Feet supported Sitting balance-Leahy Scale: Fair Sitting balance - Comments: fair-     Standing balance-Leahy Scale: Poor                             ADL either performed or assessed with clinical judgement   ADL Overall ADL's : Needs assistance/impaired Eating/Feeding: Sitting;Minimal assistance Eating/Feeding Details (indicate cue type and reason): dysphasia 3 diet, thin liquids, daughter ordered oatmeal and coffee for him, provided set up and min assist for self feeding (more for encouragement to eat, pt is able to physically self feed) Grooming: Bed level;Set up;Supervision/safety   Upper Body Bathing: Bed level;Moderate assistance  Lower Body Bathing: Bed level;Maximal assistance   Upper Body Dressing : Sitting;Moderate assistance   Lower Body Dressing: Bed level;Maximal assistance   Toilet Transfer: BSC;Stand-pivot;Moderate assistance   Toileting- Clothing Manipulation and Hygiene: Maximal assistance       Functional mobility during ADLs: Moderate  assistance       Vision Baseline Vision/History: Wears glasses Wears Glasses: At all times(does not have here) Patient Visual Report: No change from baseline Vision Assessment?: No apparent visual deficits     Perception     Praxis      Pertinent Vitals/Pain Pain Assessment: Faces Faces Pain Scale: Hurts little more Pain Location: denies pain but then moans a bit when nursing was cleaning foley and pt scratching at wrists "itchy" once mits removed  Pain Descriptors / Indicators: Moaning Pain Intervention(s): Limited activity within patient's tolerance;Monitored during session     Hand Dominance Right   Extremity/Trunk Assessment Upper Extremity Assessment Upper Extremity Assessment: Generalized weakness(at least 4-/5 bilaterally, bruises up and down BUE, pt indicates his skin is itchy)   Lower Extremity Assessment Lower Extremity Assessment: Generalized weakness(at least 3+/5 bilaterally)       Communication Communication Communication: HOH   Cognition Arousal/Alertness: Awake/alert Behavior During Therapy: Flat affect;Restless Overall Cognitive Status: Difficult to assess Area of Impairment: Orientation;Following commands;Safety/judgement                 Orientation Level: (oriented to self)     Following Commands: Follows one step commands consistently;Follows multi-step commands with increased time Safety/Judgement: Decreased awareness of safety         General Comments  on 2L O2, difficulty obtaining SPO2 and HR as pt kept taking pulse oximeter off, HR 115, O2 sats 88% on 2L O2 (unsure if vitals are accurate due to pt unable to keep pulse ox in place)    Exercises     Shoulder Instructions      Home Living Family/patient expects to be discharged to:: Private residence Living Arrangements: Children(lives with 54yo son, Chanetta Marshall) Available Help at Discharge: Family;Personal care attendant;Available PRN/intermittently Type of Home: Mobile home Home  Access: Ramped entrance     Home Layout: One level     Bathroom Shower/Tub: Chief Strategy Officer: Standard     Home Equipment: Environmental consultant - 2 wheels;Walker - 4 wheels;Wheelchair - manual;Bedside commode;Grab bars - toilet;Grab bars - tub/shower   Additional Comments: Chronic indwelling foley      Prior Functioning/Environment Level of Independence: Needs assistance  Gait / Transfers Assistance Needed: per daughter, over past 62mo, pt has ambulated less, stays in recliner most of the time, but was ambulating short HH distances with RW with assist to steady him ADL's / Homemaking Assistance Needed: [facility] 9-11am to assist w/ cleaning, sponge bath; dtr pays for additional aide from 12-5p for meals and assist; dtr goes back and forth after 5pm to help pt as needed (lives next door)   Comments: 2L O2 at home        OT Problem List: Decreased strength;Decreased activity tolerance;Cardiopulmonary status limiting activity;Decreased safety awareness;Impaired balance (sitting and/or standing)      OT Treatment/Interventions: Self-care/ADL training;Therapeutic exercise;Therapeutic activities;Energy conservation;Patient/family education    OT Goals(Current goals can be found in the care plan section) Acute Rehab OT Goals Patient Stated Goal: to go home OT Goal Formulation: With patient Time For Goal Achievement: 04/26/17 Potential to Achieve Goals: Good ADL Goals Pt Will Perform Lower Body Dressing: with caregiver independent in assisting Pt Will Transfer  to Toilet: ambulating;bedside commode;with min assist(RW for ambulation)  OT Frequency: Min 2X/week   Barriers to D/C:            Co-evaluation              AM-PAC PT "6 Clicks" Daily Activity     Outcome Measure Help from another person eating meals?: A Little Help from another person taking care of personal grooming?: A Little Help from another person toileting, which includes using toliet, bedpan, or  urinal?: A Lot Help from another person bathing (including washing, rinsing, drying)?: A Lot Help from another person to put on and taking off regular upper body clothing?: A Little Help from another person to put on and taking off regular lower body clothing?: A Lot 6 Click Score: 15   End of Session Equipment Utilized During Treatment: Gait belt Nurse Communication: Other (comment)(mits off for breakfast with daughter, up in chair)  Activity Tolerance: Patient tolerated treatment well Patient left: in chair;with call bell/phone within reach;with chair alarm set;with nursing/sitter in room;with family/visitor present(Palliative care NP in room, dtr in room )  OT Visit Diagnosis: Other abnormalities of gait and mobility (R26.89);Muscle weakness (generalized) (M62.81)                Time: 1610-9604 OT Time Calculation (min): 44 min Charges:  OT General Charges $OT Visit: 1 Visit OT Evaluation $OT Eval Low Complexity: 1 Low OT Treatments $Self Care/Home Management : 23-37 mins G-Codes:     Richrd Prime, MPH, MS, OTR/L ascom [phone] 04/12/17, 11:31 AM

## 2017-04-12 NOTE — Discharge Summary (Signed)
Daniel Dickson, is a 84 y.o. male  DOB 1912-10-19  MRN [id number].  Admission date:  04/02/2017  Admitting Physician  Bertrum Sol, MD  Discharge Date:  04/12/2017   Primary MD  [name], MD  Recommendations for primary care physician for things to follow:   Follow with PCP in 1 week  Admission Diagnosis  COPD exacerbation (HCC) [J44.1] Dyspnea, unspecified type [R06.00]   Discharge Diagnosis  COPD exacerbation (HCC) [J44.1] Dyspnea, unspecified type [R06.00]   Active Problems:   CAP (community acquired pneumonia)      Past Medical History:  Diagnosis Date  . Anxiety   . BPH (benign prostatic hyperplasia)    a. complicated by urinary retention req chronic foley.  . Cellulitis    a. 10/2016 - Right leg.  . Chronic systolic CHF (congestive heart failure) (HCC)    a. 02/2016 Echo: EF 40-45%, no rwma, mildly dil LA.  Marland Kitchen Chronic Urinary retention    a. Chronic indwelling foley.  Marland Kitchen COPD (chronic obstructive pulmonary disease) (HCC)    a. On chronic supplemental O2 via [place].  Marland Kitchen Coronary artery disease    a. 03/2011 Inflat STEMI/PCI ([facility]): LM nl, LAD 50p, LCX 48m, RI nl, RCA 100p (2.75x22 MDT Integrity BMS)-->post procedure course complicated by RFV DVT req coumadin and then R SFA PSA & hematoma req surgical evacuation.  . DVT (deep venous thrombosis) (HCC)    a. 03/2011 R Femoral Vein DVT following Cath/PCI.  Marland Kitchen Hematuria   . Hemorrhoids   . History of GI diverticular bleed    a. 02/2017 - rectal bleeding felt to be diverticular-->colonoscopy deferrred.  . Hypertensive heart disease   . Ischemic cardiomyopathy    a. 02/2016 Echo: EF 40-45%.  . Mixed hyperlipidemia   . PTSD (post-traumatic stress disorder)    with headaches  . Right SFA Pseudoaneurysm (HCC)    a. 03/2011 following Cath/PCI-->complicated  by hematoma req surgical evacuation.    Past Surgical History:  Procedure Laterality Date  . CATARACT EXTRACTION    . CHOLECYSTECTOMY    . CORONARY ANGIOPLASTY     with stent; [facility]  . HAND SURGERY    . HEMORRHOID SURGERY    . HIP SURGERY    . VASCULAR SURGERY     stent R femoral artery       History of present illness and  Hospital Course:     Kindly see H&P for history of present illness and admission details, please review complete Labs, Consult reports and Test reports for all details in brief  HPI  from the history and physical done on the day of admission 84 year old male patient here for COPD exacerbation, pneumonia.  Patient admitted 8 times the last 6 months.  Comes in with shortness of breath, chest x-ray showed apneumonia.   Hospital Course  Community-acquired pneumonia: Patient received Rocephin, Zithromax, discharged home with Augmentin, azithromycin, patient has oxygen 2-3 L at home.  X-ray #2 COPD exacerbation: Wheezing improved at the time of discharge, patient received IV steroids, discharged home with prednisone Dosepak.  Patient also has bronchodilators with DuoNeb's at home. 3.  Elevated troponin secondary to demand ischemia.  And seen by cardiology and he is not a candidate for invasive cardiac intervention due to advanced age, severe COPD, 4.  Acute on chronic respiratory failure due to pneumonia, COPD exacerbation.  Patient uses Spiriva at home. 5.  Severe malnutrition: Seen by nutritionist patient on Ensure. 6.  CODE STATUS DNR. Patient refused placement  on multiple occasions.   Discharge Condition: Stable   Follow UP  Follow-up Information    Dortha KernBliss, Laura K, MD. Schedule an appointment as soon as possible for a visit in 1 week.   Specialty:  Family Medicine Why:  OFFICE CLOSED FOR HOLIDAY, PLEASE CALL THURSDAY TO MAKE THIS APPOINTMENT. Thanks! Contact information: [address] [place] [id number] [phone]        Antonieta IbaGollan, Timothy J,  MD. Go on 05/01/2017.   Specialty:  Cardiology Why:  Appointment Time: 3:20pm Contact information: [address] [place] [address] [phone]             Discharge Instructions  and  Discharge Medications      Allergies as of 04/05/2017      Reactions   Iodine (kelp) [iodine] Nausea And Vomiting      Medication List    TAKE these medications   acetaminophen 500 MG tablet Commonly known as:  TYLENOL Take 500 mg by mouth daily as needed for mild pain or moderate pain.   amoxicillin-clavulanate 875-125 MG tablet Commonly known as:  AUGMENTIN Take 1 tablet by mouth 2 (two) times daily for 14 days.   aspirin 81 MG chewable tablet Commonly known as:  ASPIRIN CHILDRENS Chew 1 tablet (81 mg total) by mouth daily.   azithromycin 250 MG tablet Commonly known as:  ZITHROMAX Daily for 3 days   budesonide 0.25 MG/2ML nebulizer solution Commonly known as:  PULMICORT Take 2 mLs (0.25 mg total) by nebulization 2 (two) times daily.   feeding supplement Liqd Take 1 Container by mouth 3 (three) times daily between meals.   furosemide 40 MG tablet Commonly known as:  LASIX Take 0.5 tablets (20 mg total) by mouth daily.   hydrocortisone 2.5 % rectal cream Commonly known as:  ANUSOL-HC Apply topically 2 (two) times daily.   ipratropium-albuterol 0.5-2.5 (3) MG/3ML Soln Commonly known as:  DUONEB Take 3 mLs by nebulization every 4 (four) hours.   magnesium hydroxide 400 MG/5ML suspension Commonly known as:  MILK OF MAGNESIA Take 30 mLs by mouth 2 (two) times daily as needed for mild constipation or moderate constipation.   multivitamin with minerals Tabs tablet Take 1 tablet by mouth daily.   nitroGLYCERIN 0.4 MG SL tablet Commonly known as:  NITROSTAT Place 0.4 mg under the tongue every 5 (five) minutes as needed for chest pain. Reported on 10/19/2015   oxybutynin 5 MG 24 hr tablet Commonly known as:  DITROPAN-XL Take 1 tablet (5 mg total) by  mouth at bedtime.   polyethylene glycol packet Commonly known as:  MIRALAX / GLYCOLAX Take 17 g by mouth daily.   predniSONE 10 MG (21) Tbpk tablet Commonly known as:  STERAPRED UNI-PAK 21 TAB Taper by 10 mg daily What changed:  additional instructions   PROAIR HFA 108 (90 Base) MCG/ACT inhaler Generic drug:  albuterol Inhale 2 puffs into the lungs every 6 (six) hours as needed. For wheezing.   albuterol (2.5 MG/3ML) 0.083% nebulizer solution Commonly known as:  PROVENTIL Take 3 mLs (2.5 mg total) by nebulization every 4 (four) hours as needed for wheezing or shortness of breath.   psyllium 95 % Pack Commonly known as:  HYDROCIL/METAMUCIL Take 1 packet by mouth daily.   tiotropium 18 MCG inhalation capsule Commonly known as:  SPIRIVA Place 1 capsule (18 mcg total) into inhaler and inhale every evening.         Diet and Activity recommendation: See Discharge Instructions above  Consults obtained -cardiology. Major procedures and Radiology Reports - PLEASE review detailed and final reports for all details, in brief -      Dg Chest Portable 1 View  Result Date: 04/19/17 CLINICAL DATA:  Acute onset of shortness of breath. EXAM: PORTABLE CHEST 1 VIEW COMPARISON:  Chest radiograph performed 04/02/2017 FINDINGS: The lungs are hyperexpanded, with underlying emphysema. Small bilateral pleural effusions are noted. Mild bibasilar opacities may reflect interstitial edema. There is no evidence of pneumothorax. The cardiomediastinal silhouette is within normal limits. No acute osseous abnormalities are seen. IMPRESSION: 1. Small bilateral pleural effusions. Mild bibasilar opacities may reflect interstitial edema. 2. Lungs hyperexpanded, with underlying emphysema. Electronically Signed   By: Roanna Raider M.D.   On: 04/19/2017 05:44   Dg Chest Portable 1 View  Result Date: 04/02/2017 CLINICAL DATA:  Initial evaluation for acute shortness of breath. EXAM: PORTABLE CHEST 1 VIEW  COMPARISON:  None. FINDINGS: Cardiac and mediastinal silhouettes are stable in size and contour, and remain within normal limits. Aortic atherosclerosis. Lungs normally inflated. Underlying COPD. Small layering bilateral pleural effusions. Associated bibasilar opacities likely atelectasis, although infiltrates could be considered as well. No pulmonary edema. No pneumothorax. No acute osseus abnormality. IMPRESSION: 1. Small layering bilateral pleural effusions with associated bibasilar opacities, likely atelectasis. Infiltrates could be considered in the correct clinical setting. 2. Underlying COPD. 3. Aortic atherosclerosis. Electronically Signed   By: Rise Mu M.D.   On: 04/02/2017 15:52   Dg Shoulder Left  Result Date: 04/02/2017 CLINICAL DATA:  Initial evaluation for acute left shoulder pain. No injury. EXAM: LEFT SHOULDER - 2+ VIEW COMPARISON:  None. FINDINGS: No acute fracture or dislocation. Humeral head in normal line with the glenoid. AC joint approximated. Heterotopic calcification at the superolateral aspect of the humeral head consistent with calcific tendinopathy. Osteoarthritic changes about the [facility] joint as well. Bones are mildly osteopenic. No soft tissue abnormality. IMPRESSION: 1. No acute osseous abnormality about the left shoulder. 2. Heterotopic calcification at the superolateral aspect of the left femoral head, consistent with underlying calcific tendinopathy. 3. Mild right acromioclavicular osteoarthrosis. Electronically Signed   By: Rise Mu M.D.   On: 04/02/2017 15:50    Micro Results     Recent Results (from the past 240 hour(s))  Culture, blood (routine x 2) Call MD if unable to obtain prior to antibiotics being given     Status: None   Collection Time: 04/02/17  8:33 PM  Result Value Ref Range Status   Specimen Description BLOOD RIGHT ANTECUBITAL  Final   Special Requests   Final    BOTTLES DRAWN AEROBIC AND ANAEROBIC Blood Culture adequate volume    Culture   Final    NO GROWTH 5 DAYS Performed at [facility], [address] [place], [place] [address]    Report Status 04/07/2017 FINAL  Final  Culture, blood (routine x 2) Call MD if unable to obtain prior to antibiotics being given     Status: None   Collection Time: 04/02/17  8:33 PM  Result Value Ref Range Status   Specimen Description BLOOD LEFT ANTECUBITAL  Final   Special Requests   Final    BOTTLES DRAWN AEROBIC AND ANAEROBIC Blood Culture adequate volume   Culture   Final    NO GROWTH 5 DAYS Performed at [facility], [address], [place], [place] [address]    Report Status 04/07/2017 FINAL  Final  Culture, blood (routine x 2)     Status: None (Preliminary  result)   Collection Time: 03/29/2017  5:28 AM  Result Value Ref Range Status   Specimen Description BLOOD LEFT ANTECUBITAL  Final   Special Requests   Final    BOTTLES DRAWN AEROBIC AND ANAEROBIC Blood Culture adequate volume   Culture   Final    NO GROWTH 4 DAYS Performed at [facility], [address], [place], [place] [address]    Report Status PENDING  Incomplete  Culture, blood (routine x 2)     Status: None (Preliminary result)   Collection Time: 03/23/2017  5:35 AM  Result Value Ref Range Status   Specimen Description BLOOD RIGHT ARM  Final   Special Requests   Final    BOTTLES DRAWN AEROBIC AND ANAEROBIC Blood Culture adequate volume   Culture   Final    NO GROWTH 4 DAYS Performed at [facility], [address] [place], [place] [address]    Report Status PENDING  Incomplete       Today   Subjective:   Daniel Dickson today has no headache,no chest abdominal pain,no new weakness tingling or numbness, feels much better wants to go home today.   Objective:   Blood pressure (!) 154/76, pulse 96, temperature (!) 97.4 F (36.3 C), temperature source Oral, resp. rate 18, height 5\' 7"  (1.702 m), weight 55.2 kg (121 lb 9.6 oz), SpO2 99 %.  No intake or  output data in the 24 hours ending 04/12/17 1056  Exam Awake Alert, Oriented x 3, No new F.N deficits, Normal affect [place].AT,PERRAL Supple Neck,No JVD, No cervical lymphadenopathy appriciated.  Symmetrical Chest wall movement, Good air movement bilaterally, CTAB RRR,No Gallops,Rubs or new Murmurs, No Parasternal Heave +ve B.Sounds, Abd Soft, Non tender, No organomegaly appriciated, No rebound -guarding or rigidity. No Cyanosis, Clubbing or edema, No new Rash or bruise  Data Review   CBC w Diff:  Lab Results  Component Value Date   WBC 21.3 (H) 04/10/2017   HGB 10.0 (L) 04/10/2017   HGB 15.6 02/06/2014   HCT 30.2 (L) 04/10/2017   HCT 47.3 02/06/2014   PLT 142 (L) 04/10/2017   PLT 143 (L) 02/06/2014   LYMPHOPCT 4 03/15/2017   MONOPCT 5 03/17/2017   EOSPCT 0 04/07/2017   BASOPCT 0 04/05/2017    CMP:  Lab Results  Component Value Date   NA 141 04/09/2017   NA 139 02/06/2014   K 4.0 04/09/2017   K 3.9 02/06/2014   CL 103 04/09/2017   CL 104 02/06/2014   CO2 30 04/09/2017   CO2 26 02/06/2014   BUN 56 (H) 04/09/2017   BUN 29 (H) 02/06/2014   CREATININE 1.10 04/09/2017   CREATININE 1.61 (H) 02/06/2014   PROT 6.4 (L) 04/05/2017   PROT 7.4 02/06/2014   ALBUMIN 3.1 (L) 03/15/2017   ALBUMIN 3.9 02/06/2014   BILITOT 1.6 (H) 04/07/2017   BILITOT 0.6 02/06/2014   ALKPHOS 51 03/19/2017   ALKPHOS 44 (L) 02/06/2014   AST 43 (H) 03/27/2017   AST 32 02/06/2014   ALT 46 04/09/2017   ALT 27 02/06/2014  .   Total Time in preparing paper work, data evaluation and todays exam - 35 minutes  Katha Hamming M.D on 04/05/2017 at 10:56 AM    Note: This dictation was prepared with Dragon dictation along with smaller phrase technology. Any transcriptional errors that result from this process are unintentional.

## 2017-04-13 ENCOUNTER — Encounter: Payer: Self-pay | Admitting: *Deleted

## 2017-04-13 DIAGNOSIS — N390 Urinary tract infection, site not specified: Secondary | ICD-10-CM

## 2017-04-13 LAB — CULTURE, BLOOD (ROUTINE X 2)
CULTURE: NO GROWTH
Culture: NO GROWTH
Special Requests: ADEQUATE
Special Requests: ADEQUATE

## 2017-04-13 MED ORDER — LORAZEPAM 0.5 MG PO TABS: 0.5000 mg | ORAL_TABLET | ORAL | 0 refills | Status: AC | PRN

## 2017-04-13 MED ORDER — GLYCOPYRROLATE 0.2 MG/ML IJ SOLN: 0.2000 mg | INTRAMUSCULAR | Status: AC | PRN

## 2017-04-13 MED ORDER — MORPHINE SULFATE (CONCENTRATE) 10 MG/0.5ML PO SOLN: 2.5000 mg | ORAL | Status: AC | PRN

## 2017-04-13 MED ORDER — GLYCOPYRROLATE 0.2 MG/ML IJ SOLN
0.2000 mg | INTRAMUSCULAR | Status: DC | PRN
  Administered 2017-04-13 (×2): 0.2 mg via SUBCUTANEOUS
  Filled 2017-04-13 (×3): qty 1

## 2017-04-13 MED ORDER — LORAZEPAM 0.5 MG PO TABS
0.5000 mg | ORAL_TABLET | ORAL | Status: DC | PRN
Start: 2017-04-13 — End: 2017-04-14

## 2017-04-13 MED ORDER — LORAZEPAM 2 MG/ML IJ SOLN
0.5000 mg | INTRAMUSCULAR | Status: DC | PRN
Start: 2017-04-13 — End: 2017-04-14

## 2017-04-13 MED ORDER — GLYCOPYRROLATE 0.2 MG/ML IJ SOLN
0.2000 mg | INTRAMUSCULAR | Status: DC | PRN
  Filled 2017-04-13: qty 1

## 2017-04-13 MED ORDER — IPRATROPIUM-ALBUTEROL 0.5-2.5 (3) MG/3ML IN SOLN
3.0000 mL | RESPIRATORY_TRACT | Status: DC
  Filled 2017-04-13: qty 3

## 2017-04-13 NOTE — Care Management Important Message (Signed)
Important Message  Patient Details  Name: Daniel Dickson MRN: [id number] Date of Birth: 05/16/1912   Medicare Important Message Given:  No  [facility] Life [facility]  Eber HongGreene, Dorissa Stinnette R, RN 04/13/2017, 11:24 AM

## 2017-04-13 NOTE — Discharge Summary (Signed)
[employer] [facility] [place] at [facility]   PATIENT NAME: Daniel Dickson    MR#:  [id number]  DATE OF BIRTH:  May 21, 1912  DATE OF ADMISSION:  04-29-2017   ADMITTING PHYSICIAN: Arnaldo Natal, MD  DATE OF DISCHARGE: 04/13/2017 PRIMARY CARE PHYSICIAN: [name], MD   ADMISSION DIAGNOSIS:  Respiratory distress [R06.03] Hypoxia [R09.02] Urinary tract infection without hematuria, site unspecified [N39.0] DISCHARGE DIAGNOSIS:  Active Problems:   Acute on chronic respiratory failure with hypoxemia (HCC)   Pressure injury of skin  SECONDARY DIAGNOSIS:   Past Medical History:  Diagnosis Date  . Anxiety   . BPH (benign prostatic hyperplasia)    a. complicated by urinary retention req chronic foley.  . Cellulitis    a. 10/2016 - Right leg.  . Chronic systolic CHF (congestive heart failure) (HCC)    a. 02/2016 Echo: EF 40-45%, no rwma, mildly dil LA.  Marland Kitchen Chronic Urinary retention    a. Chronic indwelling foley.  Marland Kitchen COPD (chronic obstructive pulmonary disease) (HCC)    a. On chronic supplemental O2 via [place].  Marland Kitchen Coronary artery disease    a. 03/2011 Inflat STEMI/PCI ([facility]): LM nl, LAD 50p, LCX 79m, RI nl, RCA 100p (2.75x22 MDT Integrity BMS)-->post procedure course complicated by RFV DVT req coumadin and then R SFA PSA & hematoma req surgical evacuation.  . DVT (deep venous thrombosis) (HCC)    a. 03/2011 R Femoral Vein DVT following Cath/PCI.  Marland Kitchen Hematuria   . Hemorrhoids   . History of GI diverticular bleed    a. 02/2017 - rectal bleeding felt to be diverticular-->colonoscopy deferrred.  . Hypertensive heart disease   . Ischemic cardiomyopathy    a. 02/2016 Echo: EF 40-45%.  . Mixed hyperlipidemia   . PTSD (post-traumatic stress disorder)    with headaches  . Right SFA Pseudoaneurysm (HCC)    a. 03/2011 following Cath/PCI-->complicated by hematoma req surgical evacuation.   HOSPITAL COURSE:   Sepsis present on admission secondary to pneumonia: on Levaquin,  patient pulled IV access, discontinued cefepime.  2acute on chronic COPD exacerbation: Continue bronchodilators, use prednisone, patient pulled out IV access., patient on oxygen at home 2 L.  Continue oxygen.  High risk for recurrent admissions, course of DNR, patient is high risk for cardiopulmonary arrest recommend palliative care consulted.,  Continue morphine. Changed to comfort care..  3.  Non-ST elevation MI: No further elevation of troponins, last troponin I 1.83 on 29 December.  Seen by cardiology, added Plavix, nitrates, continue beta-blockers, 40-45%.  Patient has acute on chronic congestive heart failure: .  EKG has some changes of ischemia but unable to get aggressive treatment due to advanced age.  Cardiology did not recommend any invasive cardiac workup at this time.  The patient is not a candidate for cardiac rehab due to clinical condition and very poor prognosis.  He is on comfort care.  5 severemalnutrition in the context of chronic illness.  He is given nutritional supplements with Ensure.  On dysphagia 3 diet, seen by speech therapy. DISCHARGE CONDITIONS:  Poor prognosis. Discharge to hospice home today. CONSULTS OBTAINED:  Treatment Team:  Laurier Nancy, MD DRUG ALLERGIES:   Allergies  Allergen Reactions  . Iodine (Kelp) [Iodine] Nausea And Vomiting   DISCHARGE MEDICATIONS:   Allergies as of 04/13/2017      Reactions   Iodine (kelp) [iodine] Nausea And Vomiting      Medication List    STOP taking these medications   amoxicillin-clavulanate 875-125 MG  tablet Commonly known as:  AUGMENTIN   aspirin 81 MG chewable tablet Commonly known as:  ASPIRIN CHILDRENS   azithromycin 250 MG tablet Commonly known as:  ZITHROMAX   feeding supplement Liqd   furosemide 40 MG tablet Commonly known as:  LASIX   multivitamin with minerals Tabs tablet   nitroGLYCERIN 0.4 MG SL tablet Commonly known as:  NITROSTAT   oxybutynin 5 MG 24 hr tablet Commonly known  as:  DITROPAN-XL   polyethylene glycol packet Commonly known as:  MIRALAX / GLYCOLAX   predniSONE 10 MG (21) Tbpk tablet Commonly known as:  STERAPRED UNI-PAK 21 TAB   psyllium 95 % Pack Commonly known as:  HYDROCIL/METAMUCIL     TAKE these medications   acetaminophen 500 MG tablet Commonly known as:  TYLENOL Take 500 mg by mouth daily as needed for mild pain or moderate pain.   albuterol (2.5 MG/3ML) 0.083% nebulizer solution Commonly known as:  PROVENTIL Take 3 mLs (2.5 mg total) by nebulization every 4 (four) hours as needed for wheezing or shortness of breath. What changed:  Another medication with the same name was removed. Continue taking this medication, and follow the directions you see here.   budesonide 0.25 MG/2ML nebulizer solution Commonly known as:  PULMICORT Take 2 mLs (0.25 mg total) by nebulization 2 (two) times daily.   glycopyrrolate 0.2 MG/ML injection Commonly known as:  ROBINUL Inject 1 mL (0.2 mg total) into the skin every 4 (four) hours as needed (excessive secretions).   hydrocortisone 2.5 % rectal cream Commonly known as:  ANUSOL-HC Apply topically 2 (two) times daily.   ipratropium-albuterol 0.5-2.5 (3) MG/3ML Soln Commonly known as:  DUONEB Take 3 mLs by nebulization every 4 (four) hours.   LORazepam 0.5 MG tablet Commonly known as:  ATIVAN Take 1 tablet (0.5 mg total) by mouth every 4 (four) hours as needed for anxiety (terminal agitation).   magnesium hydroxide 400 MG/5ML suspension Commonly known as:  MILK OF MAGNESIA Take 30 mLs by mouth 2 (two) times daily as needed for mild constipation or moderate constipation.   morphine CONCENTRATE 10 MG/0.5ML Soln concentrated solution Take 0.13-0.25 mLs (2.6-5 mg total) by mouth every 2 (two) hours as needed for moderate pain, severe pain or shortness of breath.   tiotropium 18 MCG inhalation capsule Commonly known as:  SPIRIVA Place 1 capsule (18 mcg total) into inhaler and inhale every  evening.        DISCHARGE INSTRUCTIONS:  See AVS.  If you experience worsening of your admission symptoms, develop shortness of breath, life threatening emergency, suicidal or homicidal thoughts you must seek medical attention immediately by calling 911 or calling your MD immediately  if symptoms less severe.  You Must read complete instructions/literature along with all the possible adverse reactions/side effects for all the Medicines you take and that have been prescribed to you. Take any new Medicines after you have completely understood and accpet all the possible adverse reactions/side effects.   Please note  You were cared for by a hospitalist during your hospital stay. If you have any questions about your discharge medications or the care you received while you were in the hospital after you are discharged, you can call the unit and asked to speak with the hospitalist on call if the hospitalist that took care of you is not available. Once you are discharged, your primary care physician will handle any further medical issues. Please note that NO REFILLS for any discharge medications will be authorized once  you are discharged, as it is imperative that you return to your primary care physician (or establish a relationship with a primary care physician if you do not have one) for your aftercare needs so that they can reassess your need for medications and monitor your lab values.    On the day of Discharge:  VITAL SIGNS:  Blood pressure 117/68, pulse (!) 123, temperature 98.3 F (36.8 C), temperature source Oral, resp. rate 18, height 5\' 7"  (1.702 m), weight 110 lb 12.8 oz (50.3 kg), SpO2 96 %. PHYSICAL EXAMINATION:  GENERAL:  84 y.o.-year-old patient lying in the bed with no acute distress.  EYES: No scleral icterus. Extraocular muscles intact.  HEENT: Head atraumatic, normocephalic. NECK:  Supple, no jugular venous distention. LUNGS: Normal breath sounds bilaterally, no wheezing,  rales,rhonchi or crepitation. No use of accessory muscles of respiration.  CARDIOVASCULAR: S1, S2 normal. No murmurs, rubs, or gallops.  ABDOMEN: Soft, non-tender, non-distended. Bowel sounds present.  EXTREMITIES: No pedal edema, cyanosis, or clubbing.  NEUROLOGIC: unable to exam. PSYCHIATRIC: The patient is confused. SKIN: No obvious rash, lesion, or ulcer.  DATA REVIEW:   CBC Recent Labs  Lab 04/10/17 0437  WBC 21.3*  HGB 10.0*  HCT 30.2*  PLT 142*    Chemistries  Recent Labs  Lab 06-May-2017 0524 04/09/17 0631  NA 138 141  K 4.3 4.0  CL 100* 103  CO2 31 30  GLUCOSE 132* 136*  BUN 53* 56*  CREATININE 1.08 1.10  CALCIUM 9.8 9.6  AST 43*  --   ALT 46  --   ALKPHOS 51  --   BILITOT 1.6*  --      Microbiology Results  Results for orders placed or performed during the hospital encounter of May 06, 2017  Culture, blood (routine x 2)     Status: None   Collection Time: 05-06-17  5:28 AM  Result Value Ref Range Status   Specimen Description BLOOD LEFT ANTECUBITAL  Final   Special Requests   Final    BOTTLES DRAWN AEROBIC AND ANAEROBIC Blood Culture adequate volume   Culture   Final    NO GROWTH 5 DAYS Performed at [facility], [address], [place], [place] [address]    Report Status 04/13/2017 FINAL  Final  Culture, blood (routine x 2)     Status: None   Collection Time: 05/06/2017  5:35 AM  Result Value Ref Range Status   Specimen Description BLOOD RIGHT ARM  Final   Special Requests   Final    BOTTLES DRAWN AEROBIC AND ANAEROBIC Blood Culture adequate volume   Culture   Final    NO GROWTH 5 DAYS Performed at [facility], [address], [place], [place] [address]    Report Status 04/13/2017 FINAL  Final    RADIOLOGY:  No results found.   Management plans discussed with the patient, family and they are in agreement.  CODE STATUS: DNR   TOTAL TIME TAKING CARE OF THIS PATIENT: 33 minutes.    Shaune Pollack M.D on 04/13/2017 at 11:00  AM  Between 7am to 6pm - Pager - [phone]  After 6pm go to [email or web address] - Social research officer, government  [facility] [place] [facility]  Office  [phone]  CC: Primary care physician; [name], MD   Note: This dictation was prepared with Dragon dictation along with smaller phrase technology. Any transcriptional errors that result from this process are unintentional.

## 2017-04-13 NOTE — Progress Notes (Signed)
SUBJECTIVE: Patient is very lethargic and having Cheyne-Stokes breathing   Vitals:   04/12/17 2342 04/13/17 0315 04/13/17 0743 04/13/17 0818  BP:    117/68  Pulse:    (!) 123  Resp:      Temp:      TempSrc:      SpO2: 97% 97% 96% 96%  Weight:      Height:        Intake/Output Summary (Last 24 hours) at 04/13/2017 0832 Last data filed at 04/13/2017 0640 Gross per 24 hour  Intake 0 ml  Output 400 ml  Net -400 ml    LABS: Basic Metabolic Panel: No results for input(s): NA, K, CL, CO2, GLUCOSE, BUN, CREATININE, CALCIUM, MG, PHOS in the last 72 hours. Liver Function Tests: No results for input(s): AST, ALT, ALKPHOS, BILITOT, PROT, ALBUMIN in the last 72 hours. No results for input(s): LIPASE, AMYLASE in the last 72 hours. CBC: No results for input(s): WBC, NEUTROABS, HGB, HCT, MCV, PLT in the last 72 hours. Cardiac Enzymes: No results for input(s): CKTOTAL, CKMB, CKMBINDEX, TROPONINI in the last 72 hours. BNP: Invalid input(s): POCBNP D-Dimer: No results for input(s): DDIMER in the last 72 hours. Hemoglobin A1C: No results for input(s): HGBA1C in the last 72 hours. Fasting Lipid Panel: No results for input(s): CHOL, HDL, LDLCALC, TRIG, CHOLHDL, LDLDIRECT in the last 72 hours. Thyroid Function Tests: No results for input(s): TSH, T4TOTAL, T3FREE, THYROIDAB in the last 72 hours.  Invalid input(s): FREET3 Anemia Panel: No results for input(s): VITAMINB12, FOLATE, FERRITIN, TIBC, IRON, RETICCTPCT in the last 72 hours.   PHYSICAL EXAM General: Well developed, well nourished, in no acute distress HEENT:  Normocephalic and atramatic Neck:  No JVD.  Lungs: Clear bilaterally to auscultation and percussion. Heart: HRRR . Normal S1 and S2 without gallops or murmurs.  Abdomen: Bowel sounds are positive, abdomen soft and non-tender  Msk:  Back normal, normal gait. Normal strength and tone for age. Extremities: No clubbing, cyanosis or edema.   Neuro: Alert and oriented X  3. Psych:  Good affect, responds appropriately  TELEMETRY: Sinus tachycardia  ASSESSMENT AND PLAN: Status post non-STEMI with the patient being hospice now and being treated medically. Patient is prognosis is poor and there is having Cheyne-Stokes breathing explained to the family that he is probably going to get tired very soon and and will be Comfortable.  Active Problems:   Acute on chronic respiratory failure with hypoxemia (HCC)   Pressure injury of skin    KHAN,SHAUKAT A, MD, [facility] CenterFACC 04/13/2017 8:32 AM

## 2017-04-13 NOTE — Clinical Social Work Note (Addendum)
[facility]  Patient and family agreeable to plans will transport via [facility] hospice RN to call report.  4:00pm  CSW was informed by hospice nurse liaison that patient is not stable for transfer to hospice home anymore, [facility].   Windell MouldingEric Austin Herd, MSW, Theresia MajorsLCSWA [phone]

## 2017-04-13 NOTE — Progress Notes (Signed)
Palliative Medicine RN Note:  Symptom check. RR 26 and labored. Pt does open his eyes when his name is called loudly, and he shakes his head no to all questions ("Are you hurting?" "Are you having trouble breathing?" "Do you know where you are?" "Are you tired?"), so his responses are not reliable, and he cannot stay awake for more than one question at a time.  Reviewed order list; contacted PMT NP Morton Stallrystal Griffin. Patient is on many PO meds that he is no longer able to swallow, and some comfort meds are ordered po. Initiated comfort care orders. RN will bring a dose of Robinul and a dose of Roxanol, as patient is working to breathe. PMT RN will follow up later if patient has not been d/c to hospice home by then.  Margret ChanceMelanie G. Elai Vanwyk, RN, BSN, [facility]CHPN 04/13/2017 9:19 AM Office [phone]

## 2017-04-13 NOTE — Progress Notes (Signed)
Several follow up visits made to patient through out the day, he has declined significantly since the first visit this morning, he continues with audible secretions and increased work of breathing, minimal response to mouth care. PRN medications given by staff RN Ladona Ridgelaylor. Writer spoke on the phone to patient's daughter Elease Hashimotoatricia to update her on his condition. Patient is no longer stable enough for transport, Elease Hashimotoatricia has requested [facility]. Hospital care team and the hospice home all updated. Page to attending Dr. Imogene Burnhen to cancel discharge. Will continue to check on patient and provide support to patient, family and staff. Dayna BarkerKaren Robertson RN, BSN, [facility]CHPN [facility], hospital Liaison [phone]

## 2017-05-01 ENCOUNTER — Ambulatory Visit: Payer: Medicare PPO | Admitting: Cardiovascular Disease

## 2017-05-12 NOTE — Progress Notes (Signed)
SUBJECTIVE: Patient is pronounce and stopped breathing    Vitals:   04/13/17 0743 04/13/17 0818 04/13/17 2038 05/08/2017 0727  BP:  117/68    Pulse:  (!) 123    Resp:    (!) 7  Temp:      TempSrc:      SpO2: 96% 96% 97%   Weight:      Height:        Intake/Output Summary (Last 24 hours) at 04/11/2017 0833 Last data filed at 04/13/2017 2200 Gross per 24 hour  Intake 100 ml  Output -  Net 100 ml    LABS: Basic Metabolic Panel: No results for input(s): NA, K, CL, CO2, GLUCOSE, BUN, CREATININE, CALCIUM, MG, PHOS in the last 72 hours. Liver Function Tests: No results for input(s): AST, ALT, ALKPHOS, BILITOT, PROT, ALBUMIN in the last 72 hours. No results for input(s): LIPASE, AMYLASE in the last 72 hours. CBC: No results for input(s): WBC, NEUTROABS, HGB, HCT, MCV, PLT in the last 72 hours. Cardiac Enzymes: No results for input(s): CKTOTAL, CKMB, CKMBINDEX, TROPONINI in the last 72 hours. BNP: Invalid input(s): POCBNP D-Dimer: No results for input(s): DDIMER in the last 72 hours. Hemoglobin A1C: No results for input(s): HGBA1C in the last 72 hours. Fasting Lipid Panel: No results for input(s): CHOL, HDL, LDLCALC, TRIG, CHOLHDL, LDLDIRECT in the last 72 hours. Thyroid Function Tests: No results for input(s): TSH, T4TOTAL, T3FREE, THYROIDAB in the last 72 hours.  Invalid input(s): FREET3 Anemia Panel: No results for input(s): VITAMINB12, FOLATE, FERRITIN, TIBC, IRON, RETICCTPCT in the last 72 hours.   PHYSICAL EXAM General: Well developed, well nourished, in no acute distress HEENT:  Normocephalic and atramatic Neck:  No JVD.  Lungs: Clear bilaterally to auscultation and percussion. Heart: HRRR . Normal S1 and S2 without gallops or murmurs.  Abdomen: Bowel sounds are positive, abdomen soft and non-tender  Msk:  Back normal, normal gait. Normal strength and tone for age. Extremities: No clubbing, cyanosis or edema.   Neuro: Alert and oriented X 3. Psych:  Good affect,  responds appropriately  TELEMETRY:   ASSESSMENT AND PLAN: s/p MI, and was made hospice care and has passed . Active Problems:   Acute on chronic respiratory failure with hypoxemia (HCC)   Pressure injury of skin    Daniel Harnish A, MD, [facility] CenterFACC 04/18/2017 8:33 AM

## 2017-05-12 NOTE — Progress Notes (Signed)
Daily Progress Note   Patient Name: Daniel Dickson       Date: 04/13/2017 DOB: 11/12/1912  Age: [age] y.o. MRN#: [id number] Attending Physician: Shaune Pollackhen, Qing, MD Primary Care Physician: Dortha KernBliss, Laura K, MD Admit Date: 03/12/2017  Reason for Consultation/Follow-up: Terminal Care  Subjective: Daniel Dickson is resting in bed with eyes closed. No family at bedside. Medication changes made earlier by palliative medicine RN, patient no longer able to swallow pills. Patient appears comfortable. No further changes at this time.   Length of Stay: 6  Current Medications: Scheduled Meds:  . budesonide  0.25 mg Nebulization BID  . ipratropium-albuterol  3 mL Nebulization Q4H  . mouth rinse  15 mL Mouth Rinse BID  . tiotropium  18 mcg Inhalation QPM    Continuous Infusions:   PRN Meds: albuterol, glycopyrrolate **OR** glycopyrrolate, hydrocortisone, LORazepam **OR** LORazepam, morphine CONCENTRATE, nitroGLYCERIN, ondansetron **OR** ondansetron (ZOFRAN) IV  Physical Exam  Constitutional: No distress.  Pulmonary/Chest: Effort normal.  Skin: Skin is warm and dry.            Vital Signs: BP 117/68   Pulse (!) 123   Temp 98.3 F (36.8 C) (Oral)   Resp (!) 7   Ht 5\' 7"  (1.702 m)   Wt 50.3 kg (110 lb 12.8 oz)   SpO2 97%   BMI 17.35 kg/m  SpO2: SpO2: 97 % O2 Device: O2 Device: Nasal Cannula O2 Flow Rate: O2 Flow Rate (L/min): 4 L/min  Intake/output summary:   Intake/Output Summary (Last 24 hours) at 04/13/2017 0843 Last data filed at 04/13/2017 2200 Gross per 24 hour  Intake 100 ml  Output -  Net 100 ml   LBM: Last BM Date: 04/12/17 Baseline Weight: Weight: 56.7 kg (125 lb) Most recent weight: Weight: 50.3 kg (110 lb 12.8 oz)       Palliative Assessment/Data: 10%    Flowsheet Rows       Most Recent Value  Intake Tab  Referral Department  Hospitalist  Unit at Time of Referral  Med/Surg Unit  Palliative Care Primary Diagnosis  Cardiac  Date Notified  04/10/18  Palliative Care Type  Return patient Palliative Care  Reason for referral  Clarify Goals of Care  Date of Admission  04/08/18  Date first seen by Palliative Care  04/10/18  # of days  Palliative referral response time  0 Day(s)  # of days IP prior to Palliative referral  2  Clinical Assessment  Psychosocial & Spiritual Assessment  Palliative Care Outcomes      Patient Active Problem List   Diagnosis Date Noted  . Pressure injury of skin 04/11/2017  . Acute on chronic respiratory failure with hypoxemia (HCC) 03/14/2017  . CAP (community acquired pneumonia) 04/02/2017  . Elevated troponin 03/21/2017  . Hematuria 03/21/2017  . Demand ischemia (HCC)   . GIB (gastrointestinal bleeding) 03/04/2017  . Shortness of breath 01/20/2017  . CHF (congestive heart failure) (HCC) 01/02/2017  . Acute on chronic respiratory failure (HCC) 11/11/2016  . Open wound of lower limb with tendon involvement, right, subsequent encounter   . Cellulitis of right leg 10/18/2016  . DNR (do not resuscitate) discussion   . Palliative care by specialist   . Adult failure to thrive   . Gross hematuria   . Acute respiratory failure (HCC) 02/18/2016  . Urinary retention 11/10/2015  . Hyperkalemia 11/02/2015  . Hypotension 11/02/2015  . Dysphagia, pharyngoesophageal phase 11/02/2015  . Esophageal spasm 11/02/2015  . Constipation 11/02/2015  . SIADH (syndrome of inappropriate ADH production) (HCC) 11/02/2015  . Urinary retention due to benign prostatic hyperplasia 11/02/2015  . Chronic indwelling Foley catheter 11/02/2015  . Urinary tract infection with hematuria 11/02/2015  . Protein-calorie malnutrition, severe 10/30/2015  . ARF (acute renal failure) (HCC) 10/29/2015  . Dyspnea 10/07/2015  . Hyponatremia 06/06/2015  . Coronary  artery disease   . Hypertensive heart disease   . Mixed hyperlipidemia   . COPD (chronic obstructive pulmonary disease) (HCC)   . COPD exacerbation (HCC) 01/11/2015  . Moderate COPD (chronic obstructive pulmonary disease) (HCC) 12/08/2014  . Fall 09/03/2014  . Hip fracture (HCC) 09/03/2014  . PTSD (post-traumatic stress disorder) 09/03/2014  . Bilateral leg edema 09/03/2014  . SOB (shortness of breath) 10/31/2012  . Essential hypertension 10/31/2012  . CAD (coronary artery disease) 10/31/2012  . Hyperlipidemia 10/31/2012  . Tachycardia 10/31/2012    Palliative Care Assessment & Plan   Patient Profile: The patient with past medical history of coronary artery disease status post PCI, COPD, ischemic cardiomyopathy and hypertension presents emergency department with shortness of breath. The patient was just discharged from the hospital 3 days ago after treatment of community-acquired pneumonia.     Assessment/ Recommendations/Plan:  [name] is no longer able to swallow pills. Medication orders adjusted accordingly. Awaiting hospice placement.  Goals of Care and Additional Recommendations:  Limitations on Scope of Treatment: Full Comfort Care  Code Status:    Code Status Orders  (From admission, onward)        Start     Ordered   04/13/17 0916  Do not attempt resuscitation (DNR)  Continuous    Question Answer Comment  In the event of cardiac or respiratory ARREST Do not call a "code blue"   In the event of cardiac or respiratory ARREST Do not perform Intubation, CPR, defibrillation or ACLS   In the event of cardiac or respiratory ARREST Use medication by any route, position, wound care, and other measures to relive pain and suffering. May use oxygen, suction and manual treatment of airway obstruction as needed for comfort.      04/13/17 0916    Code Status History    Date Active Date Inactive Code Status Order ID Comments User Context   04/01/2017 10:26 04/13/2017 09:16  DNR [id number]  Arnaldo Natal, MD Inpatient   04/03/2017  11:20 04/05/2017 16:47 DNR [id number]  Auburn Bilberry, MD Inpatient   04/02/2017 20:05 04/03/2017 11:20 Full Code [id number]  Bertrum Sol, MD ED   03/21/2017 03:02 03/23/2017 17:35 Full Code [id number]  Bertrum Sol, MD Inpatient   03/04/2017 10:38 03/05/2017 17:53 Full Code [id number]  Adrian Saran, MD Inpatient   03/04/2017 09:59 03/04/2017 10:38 DNR [id number]  Adrian Saran, MD Inpatient   01/20/2017 04:46 01/21/2017 16:22 Full Code [id number]  Tonye Royalty, DO Inpatient   01/02/2017 19:37 01/03/2017 17:59 DNR [id number]  Milagros Loll, MD ED   11/12/2016 09:32 11/13/2016 15:37 Full Code [id number]  Gasper Lloyd, RN Inpatient   11/11/2016 11:23 11/12/2016 09:32 DNR [id number]  Enedina Finner, MD Inpatient   10/18/2016 20:36 10/20/2016 19:52 DNR [id number]  Milagros Loll, MD ED   09/27/2016 15:00 09/28/2016 17:19 DNR [id number]  Altamese Dilling, MD Inpatient   09/24/2016 08:52 09/27/2016 15:00 Full Code [id number]  Enid Baas, MD Inpatient   08/30/2016 05:16 09/02/2016 16:04 Full Code [id number]  Arnaldo Natal, MD Inpatient   02/18/2016 08:27 02/22/2016 18:50 Full Code [id number]  Enedina Finner, MD Inpatient   10/29/2015 21:19 11/02/2015 14:22 Full Code [id number]  Enid Baas, MD Inpatient   10/07/2015 07:32 10/09/2015 16:29 Full Code [id number]  Ihor Austin, MD Inpatient   06/07/2015 00:04 06/10/2015 16:31 Full Code [id number]  Oralia Manis, MD Inpatient   01/11/2015 13:54 01/12/2015 13:58 Full Code [id number]  Alford Highland, MD ED    Advance Directive Documentation     Most Recent Value  Type of Advance Directive  Healthcare Power of Attorney, Living will  Pre-existing out of facility DNR order (yellow form or pink MOST form)  No data  "MOST" Form in Place?  No data       Prognosis:   < 2 weeks  Discharge Planning:  Hospice facility   Thank you for allowing the Palliative Medicine Team to assist in the care  of this patient.   Total Time 15 min Prolonged Time Billed No      Greater than 50%  of this time was spent counseling and coordinating care related to the above assessment and plan.  Morton Stall, NP  Please contact Palliative Medicine Team phone at [phone] for questions and concerns.

## 2017-05-12 NOTE — Progress Notes (Signed)
[facility] [place] [facility]   PATIENT NAME: Daniel Dickson    MR#:  [id number]  DATE OF BIRTH:  06/21/1912  DATE OF ADMISSION:  03/31/2017   ADMITTING PHYSICIAN: Arnaldo NatalMichael S Diamond, MD  DATE OF DEATH: 04/21/2017 PRIMARY CARE PHYSICIAN: Dortha KernBliss, Laura K, MD   ADMISSION DIAGNOSIS:  Respiratory distress [R06.03] Hypoxia [R09.02] Urinary tract infection without hematuria, site unspecified [N39.0] DISCHARGE DIAGNOSIS:  Active Problems:   Acute on chronic respiratory failure with hypoxemia (HCC)   Pressure injury of skin  SECONDARY DIAGNOSIS:   Past Medical History:  Diagnosis Date  . Anxiety   . BPH (benign prostatic hyperplasia)    a. complicated by urinary retention req chronic foley.  . Cellulitis    a. 10/2016 - Right leg.  . Chronic systolic CHF (congestive heart failure) (HCC)    a. 02/2016 Echo: EF 40-45%, no rwma, mildly dil LA.  Marland Kitchen. Chronic Urinary retention    a. Chronic indwelling foley.  Marland Kitchen. COPD (chronic obstructive pulmonary disease) (HCC)    a. On chronic supplemental O2 via [place].  Marland Kitchen. Coronary artery disease    a. 03/2011 Inflat STEMI/PCI ([facility]): LM nl, LAD 50p, LCX 4543m, RI nl, RCA 100p (2.75x22 MDT Integrity BMS)-->post procedure course complicated by RFV DVT req coumadin and then R SFA PSA & hematoma req surgical evacuation.  . DVT (deep venous thrombosis) (HCC)    a. 03/2011 R Femoral Vein DVT following Cath/PCI.  Marland Kitchen. Hematuria   . Hemorrhoids   . History of GI diverticular bleed    a. 02/2017 - rectal bleeding felt to be diverticular-->colonoscopy deferrred.  . Hypertensive heart disease   . Ischemic cardiomyopathy    a. 02/2016 Echo: EF 40-45%.  . Mixed hyperlipidemia   . PTSD (post-traumatic stress disorder)    with headaches  . Right SFA Pseudoaneurysm (HCC)    a. 03/2011 following Cath/PCI-->complicated by hematoma req surgical evacuation.   HOSPITAL COURSE:   Sepsis present on admission secondary to pneumonia: on Levaquin, patient  pulled IV access, discontinued cefepime.  2acute on chronic COPD exacerbation: Continue bronchodilators, use prednisone, patient pulled out IV access., patient on oxygen at home 2 L.  Continue oxygen.  High risk for recurrent admissions, course of DNR, patient is high risk for cardiopulmonary arrest recommend palliative care consulted.,  Continue morphine. Changed to comfort care..  3.  Non-ST elevation MI: No further elevation of troponins, last troponin I 1.83 on 29 December.  Seen by cardiology, added Plavix, nitrates, continue beta-blockers, 40-45%.  Patient has acute on chronic congestive heart failure: .  EKG has some changes of ischemia but unable to get aggressive treatment due to advanced age.  Cardiology did not recommend any invasive cardiac workup at this time.  The patient is not a candidate for cardiac rehab due to clinical condition and very poor prognosis.  He is on comfort care.  5 severemalnutrition in the context of chronic illness.  He is given nutritional supplements with Ensure.  On dysphagia 3 diet, seen by speech therapy.  The patient expired at 7:34 am today.  CODE STATUS: DNR   Shaune PollackQing Briellah Baik M.D on 05/11/2017 at 7:53 AM  Between 7am to 6pm - Pager - [phone]  After 6pm go to [email or web address] - Social research officer, governmentpassword EPAS [email or web address]  [facility] [place] [facility]  Office  [phone]  CC: Primary care physician; Dortha KernBliss, Laura K, MD   Note: This dictation was prepared with Dragon dictation along with smaller phrase technology. Any transcriptional errors that result  from this process are unintentional.

## 2017-05-12 NOTE — Progress Notes (Addendum)
Patient noted to not be breathing or have a pulse.  Listened to apical pulse for 1minute and for breath sounds.  Pronounced dead at 0734 by Donivan ScullAmy Llana Deshazo, RN and Margaretmary DysJessica Christmas, RN.  Dr. Imogene Burnhen paged.  [employer] Museum/gallery exhibitions officer(Supervisor) notified.    Dr Imogene Burnhen stated he would not be calling family.  Ann Museum/gallery exhibitions officer(Supervisor) agreeable to call family (daughter).

## 2017-05-12 DEATH — deceased

## 2018-10-29 IMAGING — CR DG ABDOMEN ACUTE W/ 1V CHEST
1 series · 3 of 3 positions shown · non-contrast
Comparison: CT abdomen pelvis 08/29/2016. CT pelvis 10/24/2015. CTA
chest 04/06/2016. Chest x-rays 04/06/2016 and earlier.

CLINICAL DATA: [AGE] with acute onset of rectal pain that
began earlier today. Last bowel movements 3 days ago. Indwelling
urinary catheter.

EXAM:
DG ABDOMEN ACUTE W/ 1V CHEST

[Series 1: dg abd acute w/chest · 0.14mm/px · 3 of 3 slices shown]
[im 1/3]
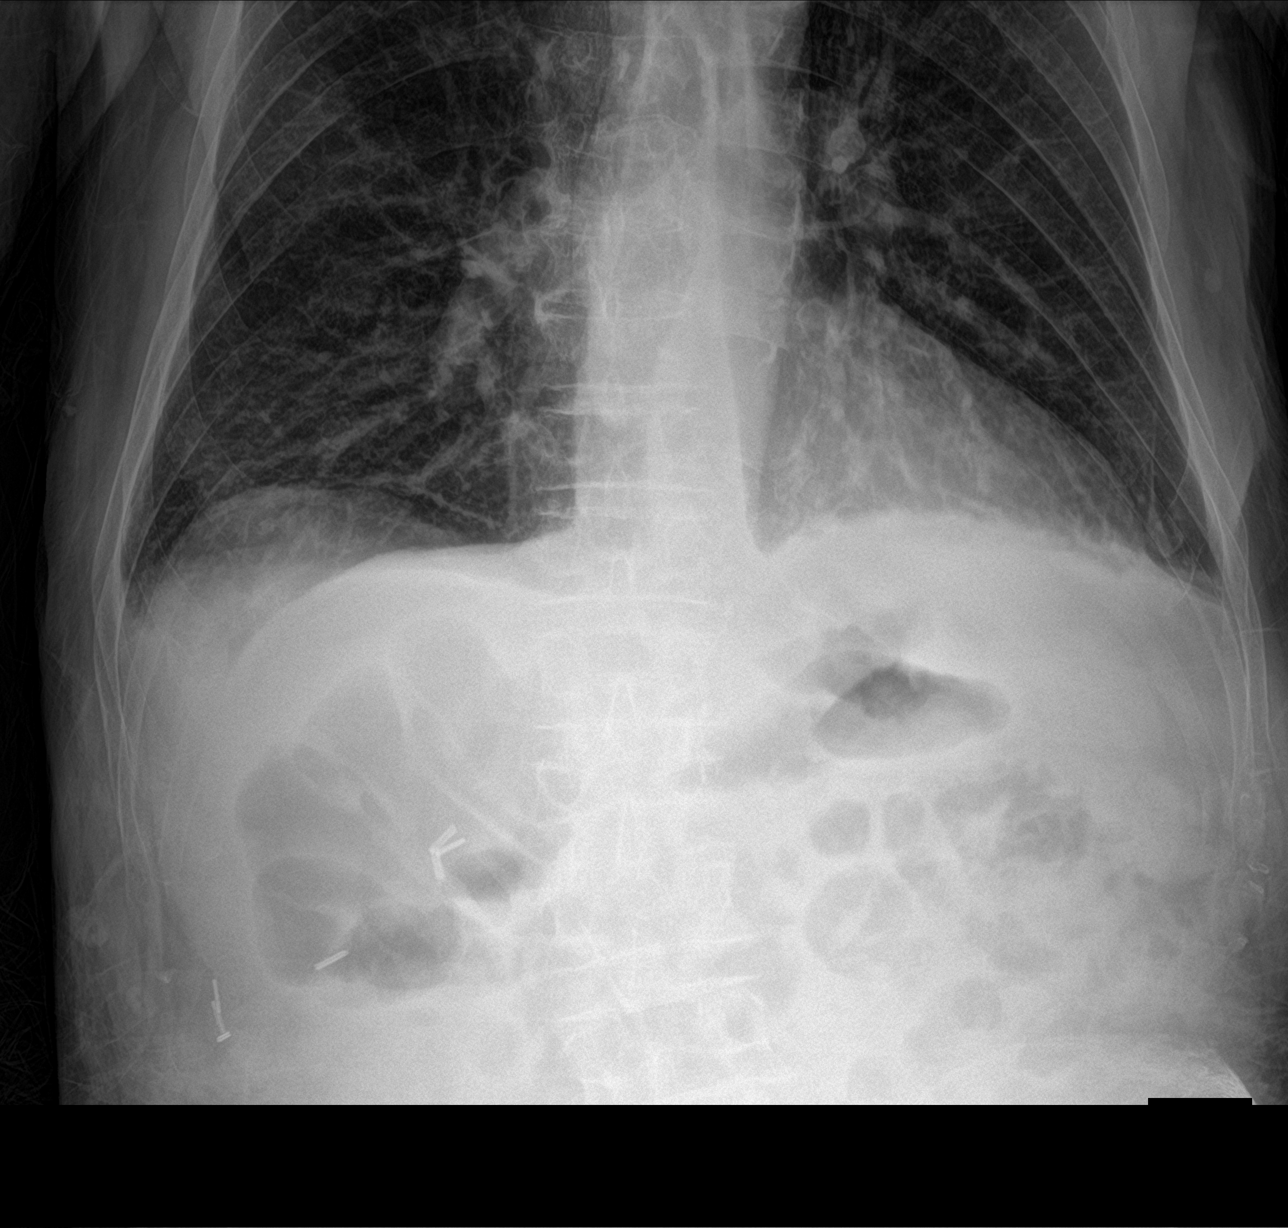
[im 2/3]
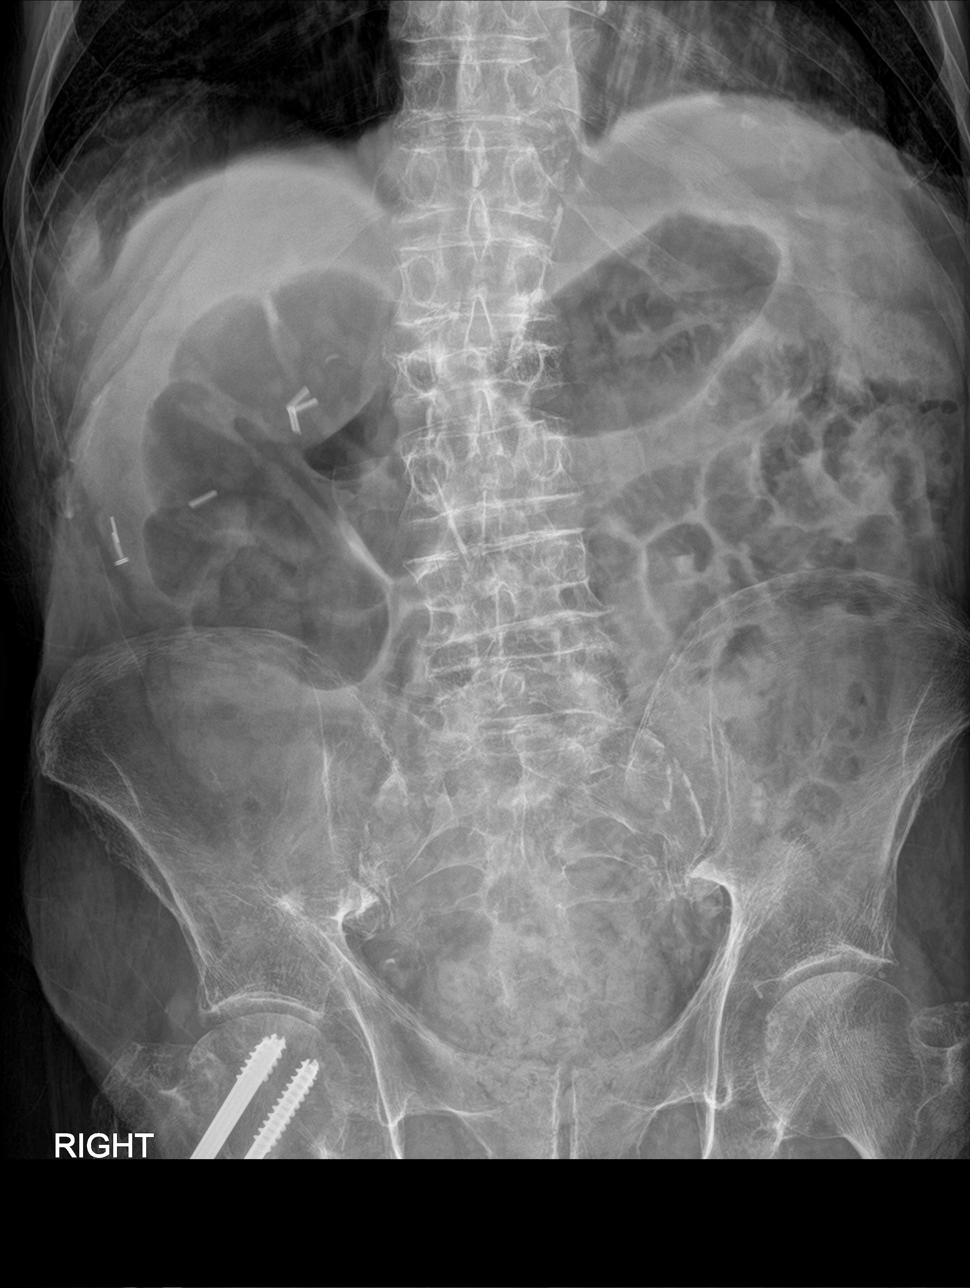
[im 3/3]
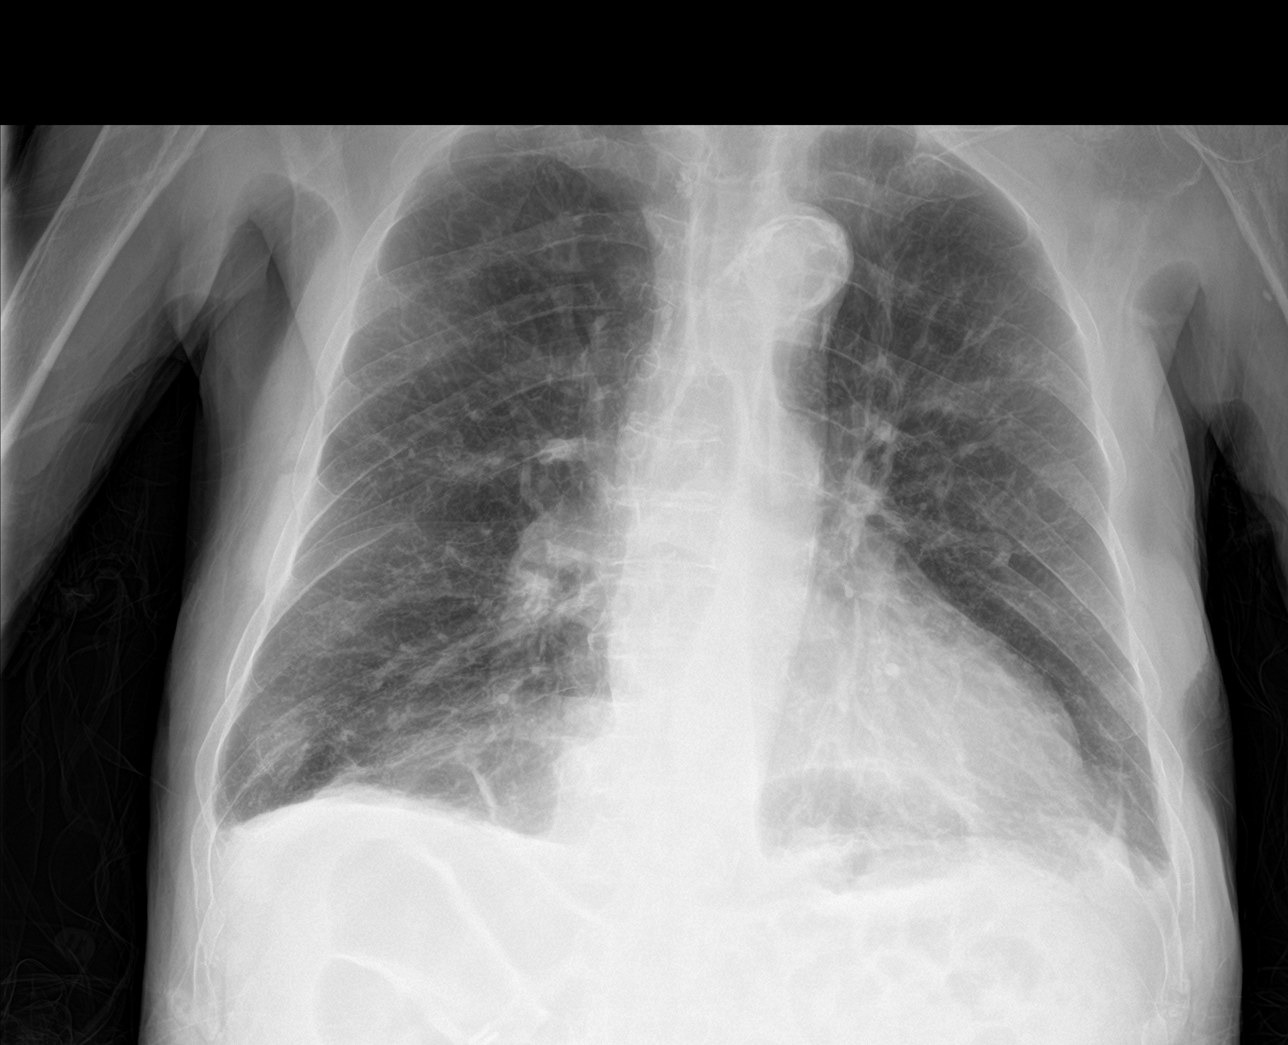

[3 of 3 positions shown; findings below may reference images not displayed]

FINDINGS: Nonobstructive bowel gas pattern. Gas in multiple normal caliber
loops of small bowel. Mild gaseous distention of the hepatic flexure
of the colon, unchanged since the prior CTs. Large stool burden in
the rectum, though expected stool burden throughout the remainder of
the colon. No evidence of free intraperitoneal air on the erect
image. No air-fluid levels. Aortoiliofemoral atherosclerosis. Severe
osseous demineralization.

Cardiac silhouette normal in size, unchanged. Suboptimal inspiration
accounts for mild atelectasis in the lung bases. Lungs otherwise
clear. Pulmonary vascularity normal.
IMPRESSION: 1. No acute abdominal abnormality apart from a large rectal stool
burden.
2. Suboptimal inspiration accounts for mild bibasilar atelectasis.
No acute cardiopulmonary disease otherwise.

## 2018-12-08 IMAGING — DX DG TIBIA/FIBULA 2V*R*
3 series · 3 of 3 positions shown · non-contrast
Comparison: Radiograph 09/29/2016

CLINICAL DATA: Right lower leg wound post fall 3 weeks prior.
Concern for osteomyelitis.

EXAM:
RIGHT TIBIA AND FIBULA - 2 VIEW

[tibia ap (1 of 2)]
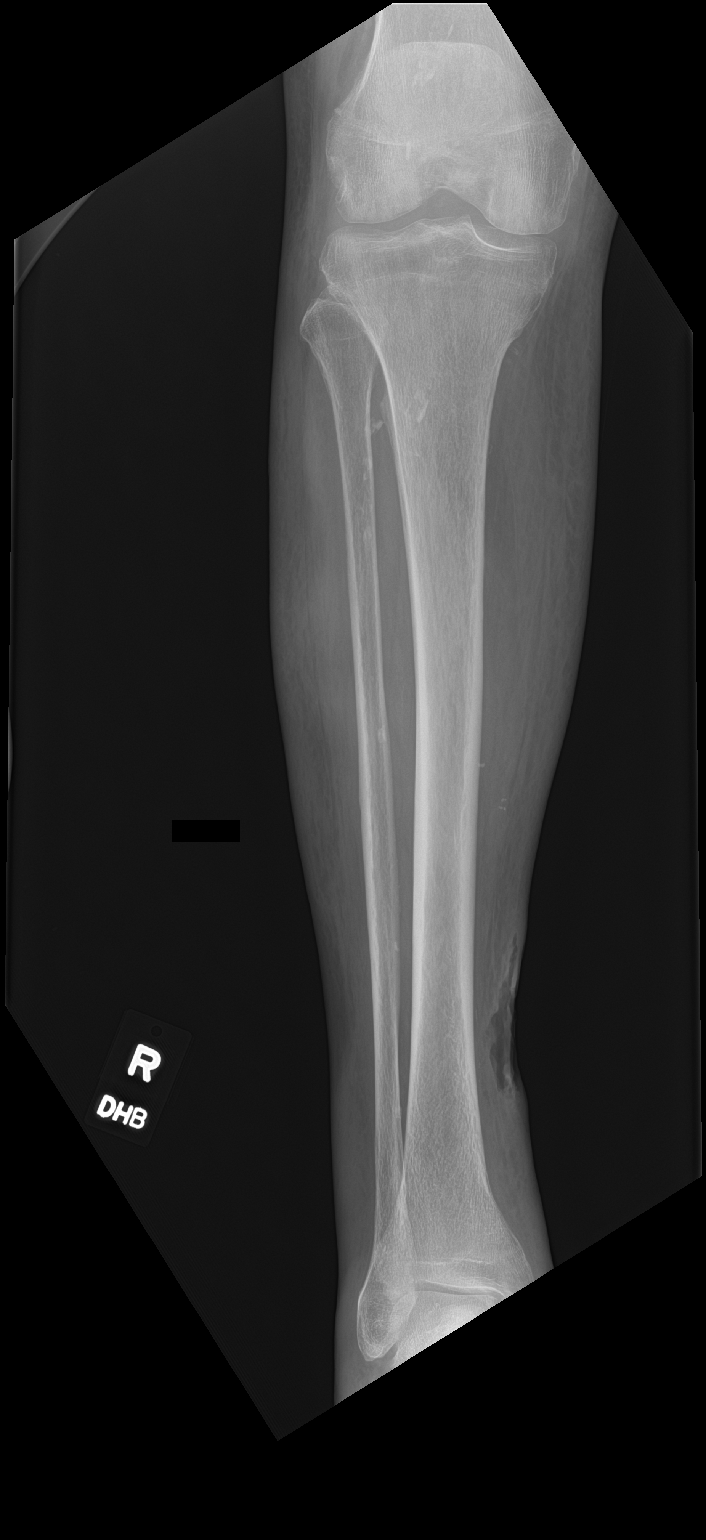

[tibia ap (2 of 2)]
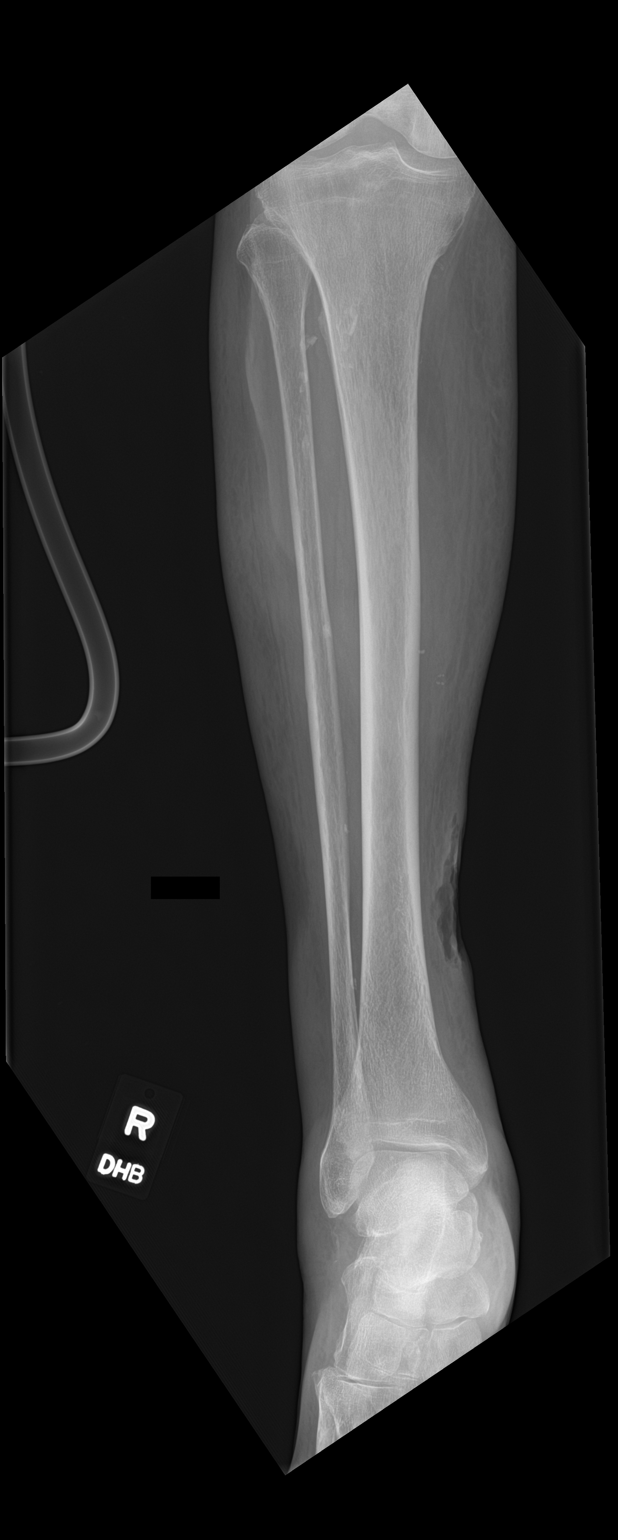

[tibia lat]
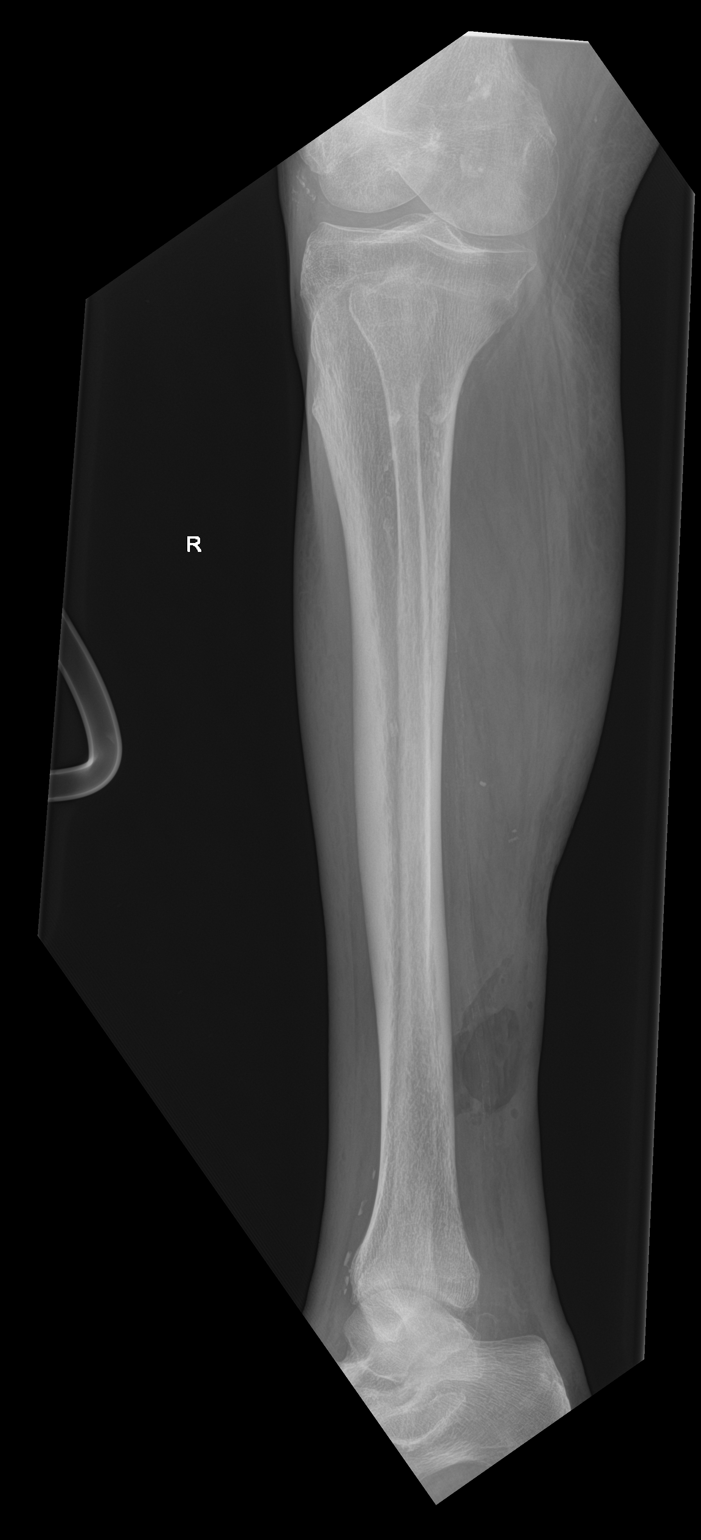

[3 of 3 positions shown; findings below may reference images not displayed]

FINDINGS: Soft tissue defect of the distal medial lower leg at site of prior
soft tissue prominence. No radiopaque foreign body. No associated
periosteal reaction, bony destructive change or abnormal density to
suggest osteomyelitis. No fracture. Mild diffuse soft tissue edema.
IMPRESSION: 1. No evidence of osteomyelitis.
2. Soft tissue defect of the distal medial lower leg at site of
prior soft tissue prominence consistent with wound.

## 2018-12-08 IMAGING — DX DG CHEST 1V PORT
1 series · 1 of 1 positions shown · non-contrast
Comparison: Chest radiograph dated 09/24/2016

CLINICAL DATA: [AGE] male with wheezing.

EXAM:
PORTABLE CHEST 1 VIEW

[chest ap]
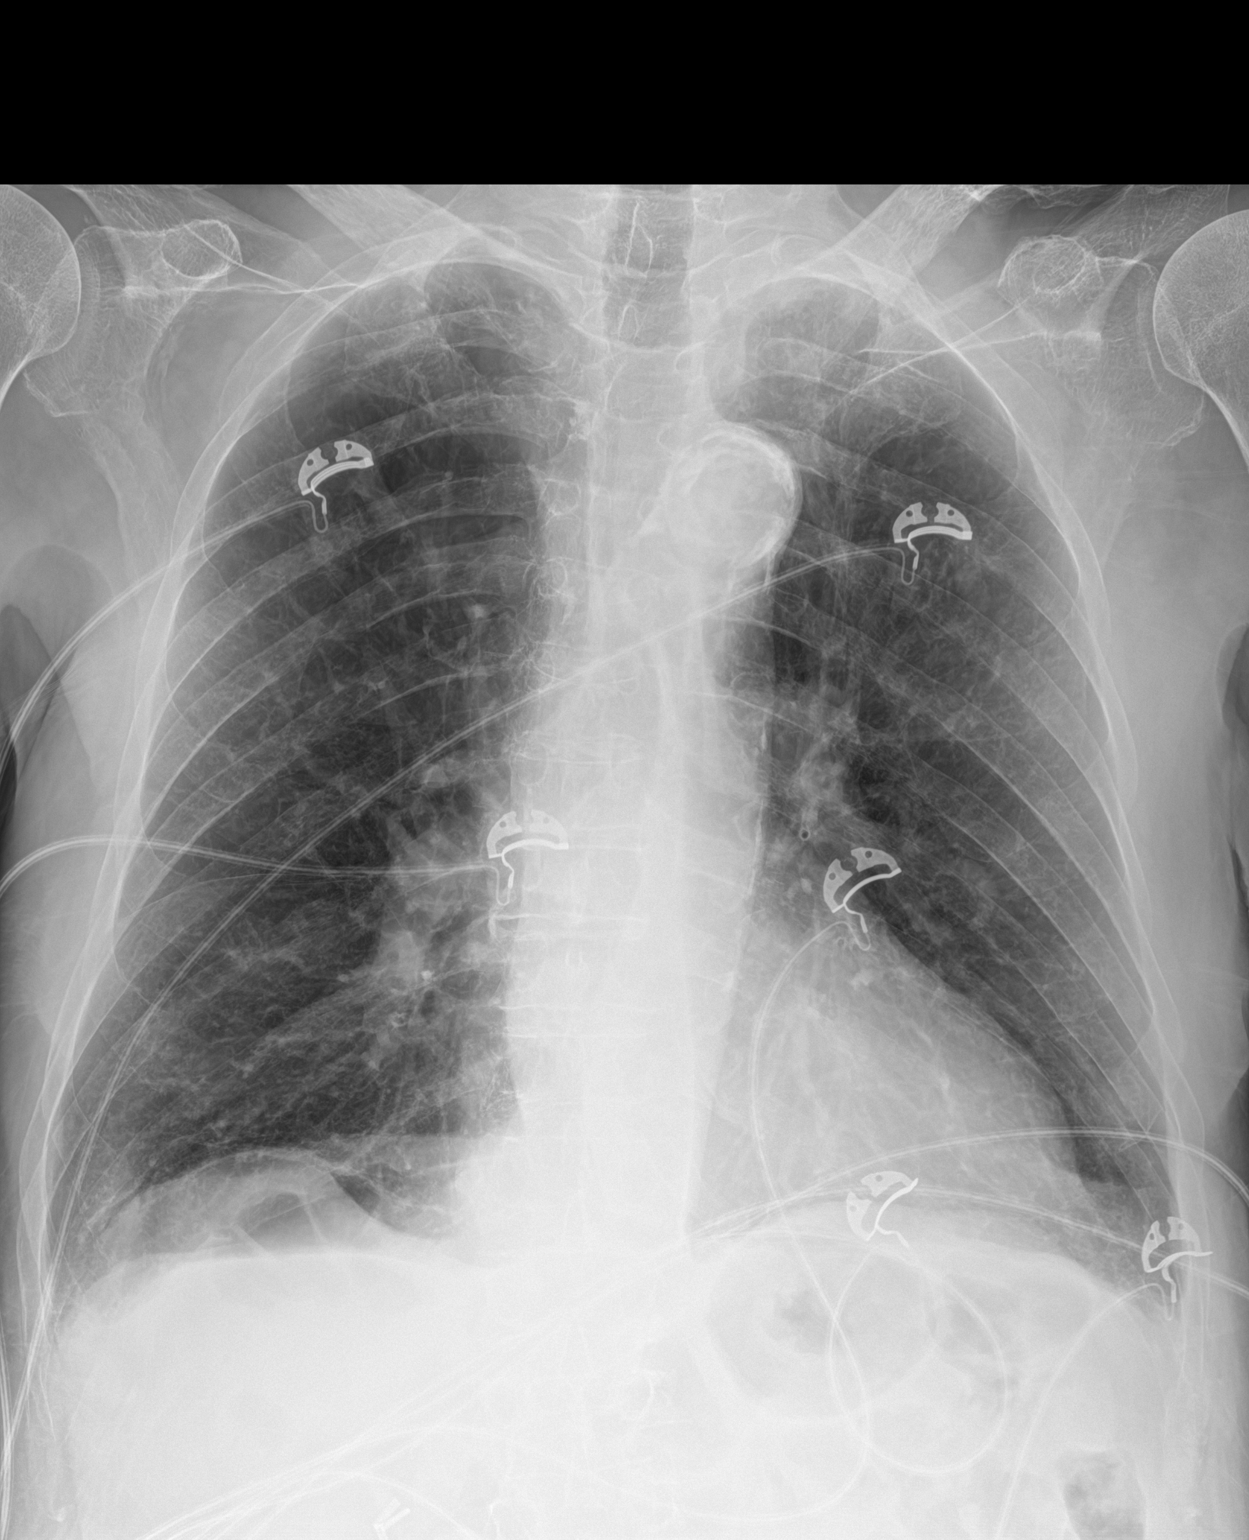

[1 of 1 positions shown; findings below may reference images not displayed]

FINDINGS: There is hyperexpansion of the lungs with flattening of the
diaphragm compatible with underlying emphysema and air trapping.
There bibasilar atelectatic changes/scarring. There is no focal
consolidation, pleural effusion, or pneumothorax. The cardiac
silhouette is within normal limits. There is atherosclerotic
calcification of the thoracic aorta. No acute osseous pathology.
IMPRESSION: 1. No acute cardiopulmonary process.
2. Emphysema.
3. Atherosclerotic aorta.

## 2019-02-22 IMAGING — DX DG CHEST 1V PORT
1 series · 2 of 2 positions shown · non-contrast
Comparison: Two-view chest x-ray 11/11/2016

CLINICAL DATA: COPD with progressive shortness of breath.

EXAM:
PORTABLE CHEST 1 VIEW

[Series 1: chest ap · 0.14mm/px · 2 of 2 slices shown]
[im 1/2]
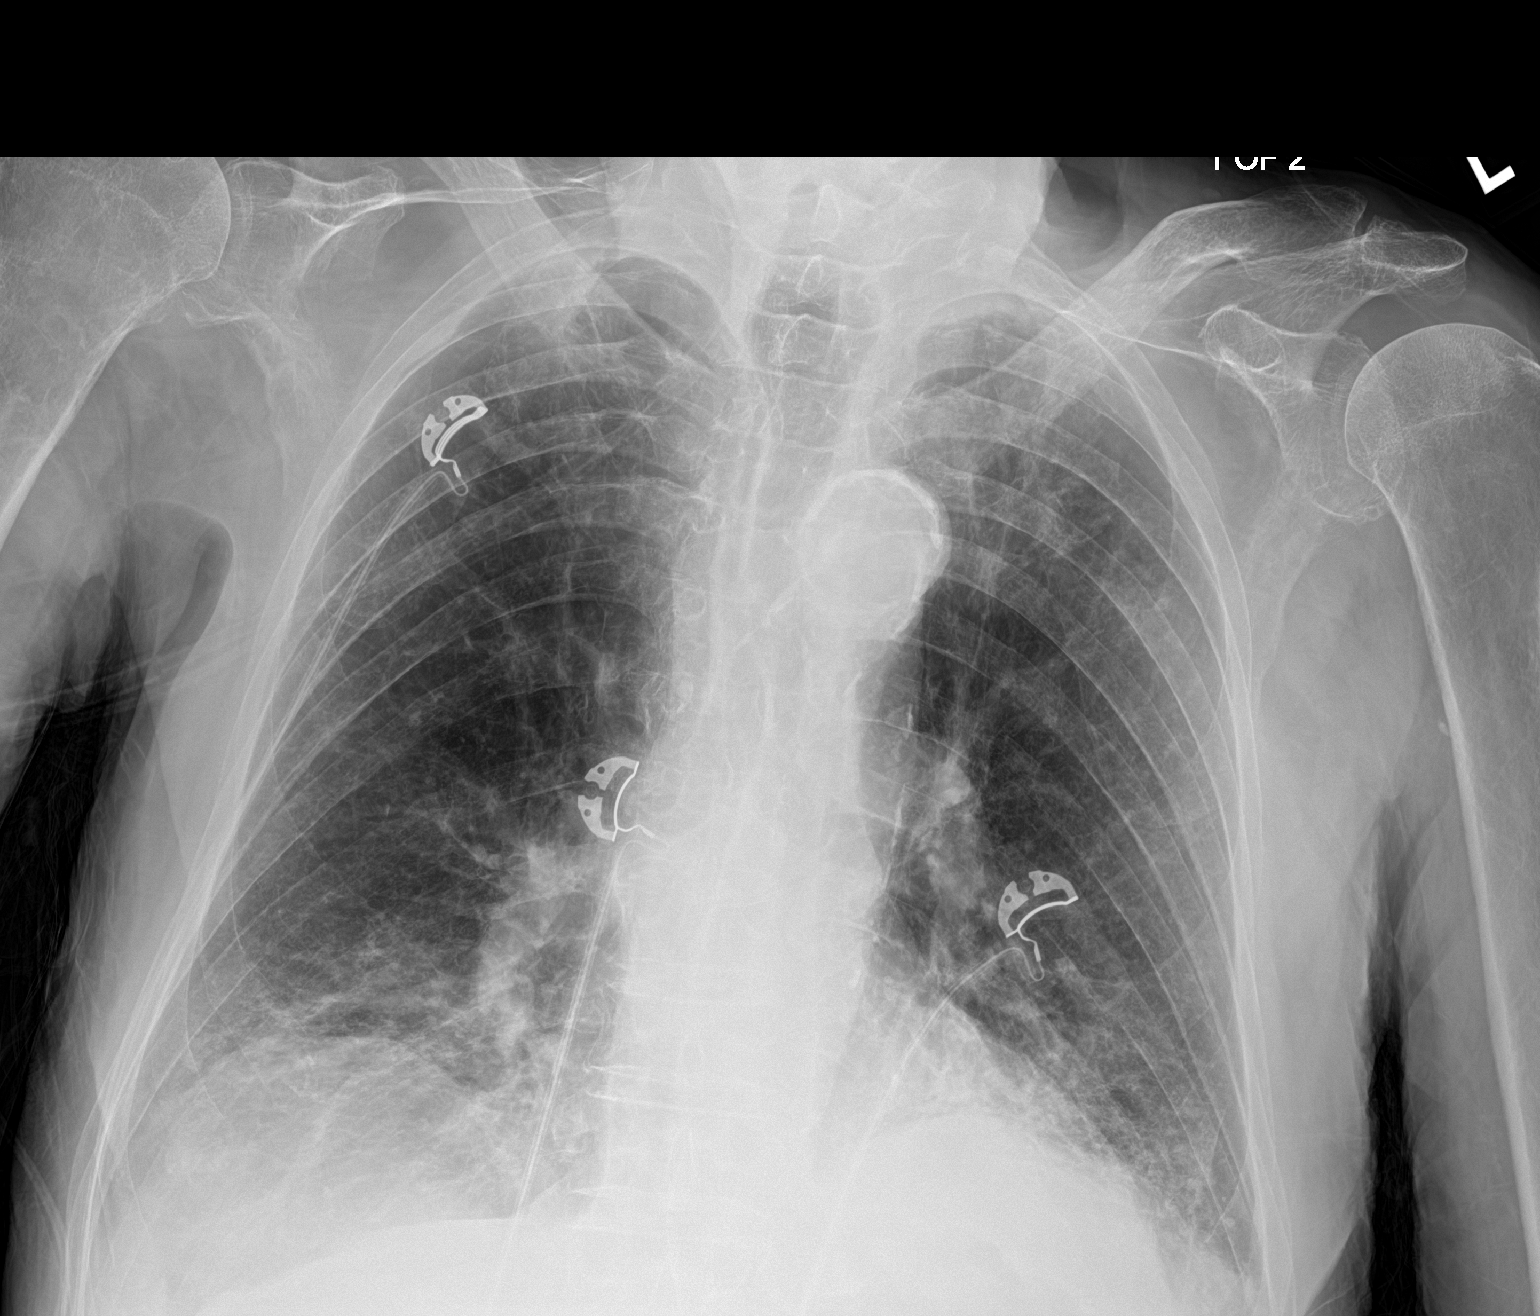
[im 2/2]
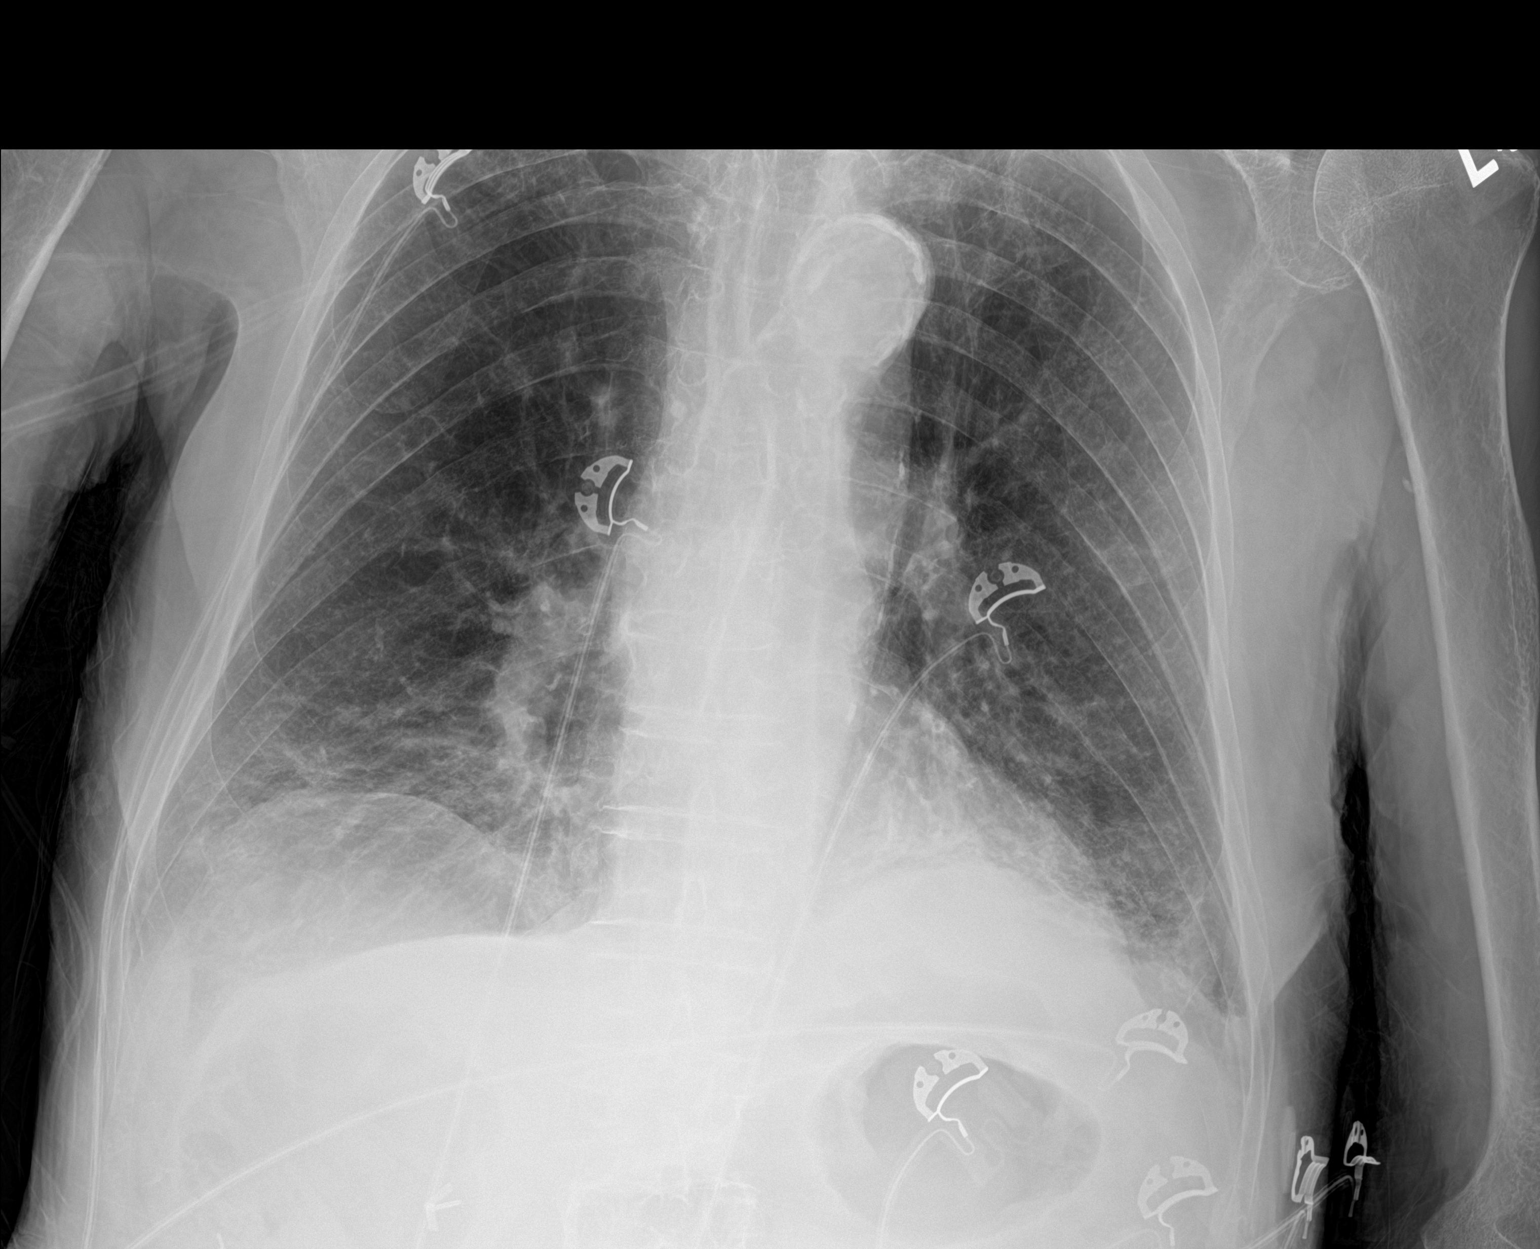

[2 of 2 positions shown; findings below may reference images not displayed]

FINDINGS: The heart size is normal. Mild lower lobe edema and airspace disease
is present. Small effusions are suggested. There is no significant
consolidation. Aortic atherosclerosis is again noted.
IMPRESSION: 1. Mild lower lobe edema the and atelectasis raise concern for
congestive heart failure.
2. Probable bilateral effusions.
3. Aortic atherosclerosis.
